# Patient Record
Sex: Male | Born: 1935
Health system: Southern US, Community
[De-identification: ages and names within clinical notes are randomized; demographics above are authoritative.]

## PROBLEM LIST (undated history)

## (undated) DIAGNOSIS — I82409 Acute embolism and thrombosis of unspecified deep veins of unspecified lower extremity: Secondary | ICD-10-CM

## (undated) DIAGNOSIS — G629 Polyneuropathy, unspecified: Secondary | ICD-10-CM

## (undated) DIAGNOSIS — K649 Unspecified hemorrhoids: Secondary | ICD-10-CM

## (undated) DIAGNOSIS — K259 Gastric ulcer, unspecified as acute or chronic, without hemorrhage or perforation: Secondary | ICD-10-CM

## (undated) DIAGNOSIS — N400 Enlarged prostate without lower urinary tract symptoms: Secondary | ICD-10-CM

## (undated) DIAGNOSIS — K449 Diaphragmatic hernia without obstruction or gangrene: Secondary | ICD-10-CM

## (undated) DIAGNOSIS — Z8742 Personal history of other diseases of the female genital tract: Secondary | ICD-10-CM

## (undated) DIAGNOSIS — K5790 Diverticulosis of intestine, part unspecified, without perforation or abscess without bleeding: Secondary | ICD-10-CM

## (undated) DIAGNOSIS — T45515A Adverse effect of anticoagulants, initial encounter: Secondary | ICD-10-CM

## (undated) DIAGNOSIS — D500 Iron deficiency anemia secondary to blood loss (chronic): Secondary | ICD-10-CM

## (undated) DIAGNOSIS — E785 Hyperlipidemia, unspecified: Secondary | ICD-10-CM

## (undated) DIAGNOSIS — I10 Essential (primary) hypertension: Secondary | ICD-10-CM

## (undated) DIAGNOSIS — G459 Transient cerebral ischemic attack, unspecified: Secondary | ICD-10-CM

## (undated) DIAGNOSIS — I4891 Unspecified atrial fibrillation: Secondary | ICD-10-CM

## (undated) DIAGNOSIS — E1140 Type 2 diabetes mellitus with diabetic neuropathy, unspecified: Secondary | ICD-10-CM

## (undated) DIAGNOSIS — D6832 Hemorrhagic disorder due to extrinsic circulating anticoagulants: Secondary | ICD-10-CM

## (undated) DIAGNOSIS — K635 Polyp of colon: Secondary | ICD-10-CM

## (undated) DIAGNOSIS — K2970 Gastritis, unspecified, without bleeding: Secondary | ICD-10-CM

## (undated) DIAGNOSIS — Z9889 Other specified postprocedural states: Secondary | ICD-10-CM

## (undated) HISTORY — DX: Hyperlipidemia, unspecified: E78.5

## (undated) HISTORY — DX: Diverticulosis of intestine, part unspecified, without perforation or abscess without bleeding: K57.90

## (undated) HISTORY — DX: Personal history of other diseases of the female genital tract: Z87.42

## (undated) HISTORY — DX: Benign prostatic hyperplasia without lower urinary tract symptoms: N40.0

## (undated) HISTORY — DX: Acute embolism and thrombosis of unspecified deep veins of unspecified lower extremity: I82.409

## (undated) HISTORY — DX: Polyneuropathy, unspecified: G62.9

## (undated) HISTORY — DX: Diaphragmatic hernia without obstruction or gangrene: K44.9

## (undated) HISTORY — PX: OTHER SURGICAL HISTORY: SHX169

## (undated) HISTORY — PX: VENA CAVA FILTER PLACEMENT: SUR1032

## (undated) HISTORY — DX: Other specified postprocedural states: Z98.890

## (undated) HISTORY — DX: Gastritis, unspecified, without bleeding: K29.70

---

## 1977-09-06 HISTORY — PX: LAPAROSCOPIC INGUINAL HERNIA REPAIR: SUR788

## 2002-09-27 ENCOUNTER — Encounter: Payer: Self-pay | Admitting: General Surgery

## 2002-09-27 ENCOUNTER — Ambulatory Visit (HOSPITAL_COMMUNITY): Admission: RE | Admit: 2002-09-27 | Discharge: 2002-09-27 | Payer: Self-pay | Admitting: General Surgery

## 2003-09-07 HISTORY — PX: OTHER SURGICAL HISTORY: SHX169

## 2003-09-13 ENCOUNTER — Inpatient Hospital Stay (HOSPITAL_COMMUNITY): Admission: RE | Admit: 2003-09-13 | Discharge: 2003-09-15 | Payer: Self-pay | Admitting: Urology

## 2005-01-28 ENCOUNTER — Other Ambulatory Visit: Admission: RE | Admit: 2005-01-28 | Discharge: 2005-01-28 | Payer: Self-pay | Admitting: General Surgery

## 2005-02-08 ENCOUNTER — Emergency Department (HOSPITAL_COMMUNITY): Admission: EM | Admit: 2005-02-08 | Discharge: 2005-02-08 | Payer: Self-pay | Admitting: Emergency Medicine

## 2005-05-18 ENCOUNTER — Inpatient Hospital Stay (HOSPITAL_COMMUNITY): Admission: EM | Admit: 2005-05-18 | Discharge: 2005-05-24 | Payer: Self-pay | Admitting: Emergency Medicine

## 2005-05-21 ENCOUNTER — Encounter (INDEPENDENT_AMBULATORY_CARE_PROVIDER_SITE_OTHER): Payer: Self-pay | Admitting: Specialist

## 2005-05-21 LAB — HM COLONOSCOPY

## 2006-11-22 ENCOUNTER — Ambulatory Visit (HOSPITAL_COMMUNITY): Admission: RE | Admit: 2006-11-22 | Discharge: 2006-11-22 | Payer: Self-pay | Admitting: General Surgery

## 2006-12-08 ENCOUNTER — Encounter (INDEPENDENT_AMBULATORY_CARE_PROVIDER_SITE_OTHER): Payer: Self-pay | Admitting: Specialist

## 2006-12-08 ENCOUNTER — Other Ambulatory Visit: Admission: RE | Admit: 2006-12-08 | Discharge: 2006-12-08 | Payer: Self-pay | Admitting: General Surgery

## 2008-12-25 ENCOUNTER — Encounter: Payer: Self-pay | Admitting: Cardiology

## 2009-01-01 DIAGNOSIS — I10 Essential (primary) hypertension: Secondary | ICD-10-CM | POA: Insufficient documentation

## 2009-01-02 ENCOUNTER — Ambulatory Visit: Payer: Self-pay | Admitting: Cardiology

## 2009-01-02 DIAGNOSIS — R9439 Abnormal result of other cardiovascular function study: Secondary | ICD-10-CM | POA: Insufficient documentation

## 2009-01-02 DIAGNOSIS — E663 Overweight: Secondary | ICD-10-CM | POA: Insufficient documentation

## 2009-01-08 ENCOUNTER — Telehealth (INDEPENDENT_AMBULATORY_CARE_PROVIDER_SITE_OTHER): Payer: Self-pay | Admitting: *Deleted

## 2009-01-09 ENCOUNTER — Encounter: Payer: Self-pay | Admitting: Cardiology

## 2009-01-09 ENCOUNTER — Ambulatory Visit: Payer: Self-pay

## 2010-07-28 ENCOUNTER — Inpatient Hospital Stay (HOSPITAL_COMMUNITY): Admission: EM | Admit: 2010-07-28 | Discharge: 2010-07-29 | Payer: Self-pay | Admitting: Emergency Medicine

## 2010-07-28 ENCOUNTER — Ambulatory Visit: Payer: Self-pay | Admitting: Cardiology

## 2010-07-29 ENCOUNTER — Encounter (INDEPENDENT_AMBULATORY_CARE_PROVIDER_SITE_OTHER): Payer: Self-pay | Admitting: Internal Medicine

## 2010-09-01 ENCOUNTER — Ambulatory Visit (HOSPITAL_COMMUNITY)
Admission: RE | Admit: 2010-09-01 | Discharge: 2010-09-01 | Payer: Self-pay | Source: Home / Self Care | Attending: Family Medicine | Admitting: Family Medicine

## 2010-11-06 ENCOUNTER — Other Ambulatory Visit: Payer: Self-pay | Admitting: Family Medicine

## 2010-11-06 DIAGNOSIS — Z09 Encounter for follow-up examination after completed treatment for conditions other than malignant neoplasm: Secondary | ICD-10-CM

## 2010-11-10 ENCOUNTER — Other Ambulatory Visit (HOSPITAL_COMMUNITY): Payer: Self-pay

## 2010-11-17 LAB — DIFFERENTIAL
Basophils Absolute: 0 10*3/uL (ref 0.0–0.1)
Basophils Absolute: 0 10*3/uL (ref 0.0–0.1)
Basophils Relative: 1 % (ref 0–1)
Eosinophils Absolute: 0.2 10*3/uL (ref 0.0–0.7)
Eosinophils Relative: 3 % (ref 0–5)
Lymphs Abs: 1.8 10*3/uL (ref 0.7–4.0)
Monocytes Absolute: 0.4 10*3/uL (ref 0.1–1.0)
Neutro Abs: 4 10*3/uL (ref 1.7–7.7)
Neutrophils Relative %: 63 % (ref 43–77)

## 2010-11-17 LAB — CARDIAC PANEL(CRET KIN+CKTOT+MB+TROPI)
Relative Index: INVALID (ref 0.0–2.5)
Troponin I: 0.01 ng/mL (ref 0.00–0.06)

## 2010-11-17 LAB — COMPREHENSIVE METABOLIC PANEL
ALT: 24 U/L (ref 0–53)
AST: 25 U/L (ref 0–37)
Albumin: 3.2 g/dL — ABNORMAL LOW (ref 3.5–5.2)
Albumin: 3.5 g/dL (ref 3.5–5.2)
Alkaline Phosphatase: 37 U/L — ABNORMAL LOW (ref 39–117)
Alkaline Phosphatase: 38 U/L — ABNORMAL LOW (ref 39–117)
BUN: 11 mg/dL (ref 6–23)
BUN: 11 mg/dL (ref 6–23)
CO2: 28 mEq/L (ref 19–32)
Calcium: 9.2 mg/dL (ref 8.4–10.5)
Chloride: 104 mEq/L (ref 96–112)
Chloride: 105 mEq/L (ref 96–112)
Creatinine, Ser: 0.8 mg/dL (ref 0.4–1.5)
GFR calc Af Amer: >60 mL/min (ref >60)
GFR calc non Af Amer: >60 mL/min (ref >60)
Glucose, Bld: 114 mg/dL — ABNORMAL HIGH (ref 70–99)
Potassium: 3.3 mEq/L — ABNORMAL LOW (ref 3.5–5.1)
Potassium: 3.6 mEq/L (ref 3.5–5.1)
Sodium: 140 mEq/L (ref 135–145)
Total Bilirubin: 0.5 mg/dL (ref 0.3–1.2)
Total Bilirubin: 0.5 mg/dL (ref 0.3–1.2)
Total Protein: 6.1 g/dL (ref 6.0–8.3)

## 2010-11-17 LAB — CBC
HCT: 36.5 % — ABNORMAL LOW (ref 39.0–52.0)
Hemoglobin: 12.9 g/dL — ABNORMAL LOW (ref 13.0–17.0)
Hemoglobin: 13.4 g/dL (ref 13.0–17.0)
MCHC: 36.4 g/dL — ABNORMAL HIGH (ref 30.0–36.0)
MCV: 89.7 fL (ref 78.0–100.0)
MCV: 90 fL (ref 78.0–100.0)
Platelets: 175 10*3/uL (ref 150–400)
RBC: 4.07 MIL/uL — ABNORMAL LOW (ref 4.22–5.81)
RBC: 4.19 MIL/uL — ABNORMAL LOW (ref 4.22–5.81)
RDW: 13.4 % (ref 11.5–15.5)
WBC: 5.2 10*3/uL (ref 4.0–10.5)
WBC: 6.4 10*3/uL (ref 4.0–10.5)

## 2010-11-17 LAB — PROTIME-INR
INR: 1.5 — ABNORMAL HIGH (ref 0.00–1.49)
Prothrombin Time: 17.1 seconds — ABNORMAL HIGH (ref 11.6–15.2)
Prothrombin Time: 18 seconds — ABNORMAL HIGH (ref 11.6–15.2)
Prothrombin Time: 18.3 seconds — ABNORMAL HIGH (ref 11.6–15.2)

## 2010-11-17 LAB — URINALYSIS, ROUTINE W REFLEX MICROSCOPIC
Bilirubin Urine: NEGATIVE
Nitrite: NEGATIVE
Specific Gravity, Urine: 1.015 (ref 1.005–1.030)
pH: 6 (ref 5.0–8.0)

## 2010-11-17 LAB — APTT: aPTT: 32 seconds (ref 24–37)

## 2010-11-17 LAB — HEMOGLOBIN A1C
Hgb A1c MFr Bld: 6.1 % — ABNORMAL HIGH (ref <5.7)
Mean Plasma Glucose: 128 mg/dL — ABNORMAL HIGH (ref <117)

## 2010-11-17 LAB — POCT CARDIAC MARKERS
Myoglobin, poc: 39.9 ng/mL (ref 12–200)
Troponin i, poc: <0.05 ng/mL (ref 0.00–0.09)

## 2010-11-28 ENCOUNTER — Encounter: Payer: Self-pay | Admitting: *Deleted

## 2010-11-28 DIAGNOSIS — I82409 Acute embolism and thrombosis of unspecified deep veins of unspecified lower extremity: Secondary | ICD-10-CM

## 2010-11-28 DIAGNOSIS — I639 Cerebral infarction, unspecified: Secondary | ICD-10-CM

## 2010-11-28 DIAGNOSIS — E785 Hyperlipidemia, unspecified: Secondary | ICD-10-CM

## 2010-11-28 DIAGNOSIS — N529 Male erectile dysfunction, unspecified: Secondary | ICD-10-CM

## 2010-11-28 DIAGNOSIS — K449 Diaphragmatic hernia without obstruction or gangrene: Secondary | ICD-10-CM

## 2010-11-28 DIAGNOSIS — G629 Polyneuropathy, unspecified: Secondary | ICD-10-CM

## 2010-11-28 DIAGNOSIS — M8580 Other specified disorders of bone density and structure, unspecified site: Secondary | ICD-10-CM

## 2010-11-28 DIAGNOSIS — N400 Enlarged prostate without lower urinary tract symptoms: Secondary | ICD-10-CM

## 2010-12-01 ENCOUNTER — Encounter (HOSPITAL_COMMUNITY): Payer: Self-pay

## 2010-12-01 ENCOUNTER — Ambulatory Visit (HOSPITAL_COMMUNITY)
Admission: RE | Admit: 2010-12-01 | Discharge: 2010-12-01 | Disposition: A | Discharge status: Home / Self Care | Payer: Medicare HMO | Source: Ambulatory Visit | Attending: Family Medicine | Admitting: Family Medicine

## 2010-12-01 DIAGNOSIS — Z09 Encounter for follow-up examination after completed treatment for conditions other than malignant neoplasm: Secondary | ICD-10-CM

## 2010-12-01 DIAGNOSIS — J984 Other disorders of lung: Secondary | ICD-10-CM | POA: Insufficient documentation

## 2010-12-01 HISTORY — DX: Essential (primary) hypertension: I10

## 2010-12-01 MED ORDER — IOHEXOL 300 MG/ML  SOLN
80.0000 mL | Freq: Once | INTRAMUSCULAR | Status: AC | PRN
  Administered 2010-12-01: 80 mL via INTRAVENOUS

## 2010-12-22 ENCOUNTER — Other Ambulatory Visit: Payer: Self-pay | Admitting: Family Medicine

## 2010-12-22 DIAGNOSIS — N289 Disorder of kidney and ureter, unspecified: Secondary | ICD-10-CM

## 2010-12-28 ENCOUNTER — Ambulatory Visit (HOSPITAL_COMMUNITY)
Admission: RE | Admit: 2010-12-28 | Discharge: 2010-12-28 | Disposition: A | Discharge status: Home / Self Care | Payer: Medicare HMO | Source: Ambulatory Visit | Attending: Family Medicine | Admitting: Family Medicine

## 2010-12-28 DIAGNOSIS — N289 Disorder of kidney and ureter, unspecified: Secondary | ICD-10-CM | POA: Insufficient documentation

## 2010-12-28 MED ORDER — GADOBENATE DIMEGLUMINE 529 MG/ML IV SOLN: 20.0000 mL | Freq: Once | INTRAVENOUS | Status: AC | PRN
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
## 2011-01-22 NOTE — Op Note (Signed)
NAME:  Keith Doyle, Keith Doyle                             ACCOUNT NO.:  [id number]   MEDICAL RECORD NO.:  [id number]                   PATIENT TYPE:  INP   LOCATION:  A204                                 FACILITY:  [facility]   PHYSICIAN:  Ky Barban, M.D.            DATE OF BIRTH:  09-Jun-1936   DATE OF PROCEDURE:  09/13/2003  DATE OF DISCHARGE:                                 OPERATIVE REPORT   PREOPERATIVE DIAGNOSIS:  Benign prostatic hypertrophy.   POSTOPERATIVE DIAGNOSIS:  Benign prostatic hypertrophy.   PROCEDURE:  Transurethral resection of the prostate.   ANESTHESIA:  Spinal.   DESCRIPTION OF PROCEDURE:  The patient was given spinal anesthesia, placed  in lithotomy position, prepped and draped.  Sissy Hoff sounds up to 30 [place]  were introduced and Microsoft resectoscope was introduced into the  bladder.  The bladder was inspected.  The resectoscope was pulled back in  the mid prostatic urethra.  He has a well developed medial lobe and also  lateral lobe hypertrophy.  The median lobe was resected from the bladder  neck to the level of the verumontanum and then the bladder neck is  circumferentially resected down to the circular fibers.  Now, the  resectoscope was pulled back to the level of the verumontanum at the 11  o'clock position.  A groove was created at the 11 o'clock position from the  bladder neck to the level of the verumontanum.  The right lobe is then  resected between the 11 and 7 o'clock position.  Similarly, the left lobe is  resected between the 1 and 5 o'clock position.  Next, the posterior midline  fissure was resected along with the tissue very carefully, not going near  the sphincter or the verumontanum.  There was a small amount of tissue left  in the midline which was resected next.  The chips were evacuated.  The  bleeders were coagulated.  The prostatic urethra is wide open, the  resectoscope is then removed.  A 22 three way Foley catheter was left in for  drainage, through and through is started which is clear.  The patient left  the operating room in satisfactory condition.                                               Ky Barban, M.D.    MIJ/MEDQ  D:  09/13/2003  T:  09/13/2003  Job:  [id number]

## 2011-01-22 NOTE — H&P (Signed)
NAME:  Keith Doyle, Keith Doyle                             ACCOUNT NO.:  [id number]   MEDICAL RECORD NO.:  [id number]                   PATIENT TYPE:  AMB   LOCATION:  DAY                                  FACILITY:  [facility]   PHYSICIAN:  Ky Barban, M.D.            DATE OF BIRTH:  08-18-1936   DATE OF ADMISSION:  DATE OF DISCHARGE:                                HISTORY & PHYSICAL   CHIEF COMPLAINT:  Symptoms of prostatism.   This 75 year old gentleman is being followed by me since March 1998.  He was  having significant voiding symptoms and at that time workup showed that he  has enlarged prostate, so he was placed on Cardura.  He has been taking  Cardura ever since with satisfactory results but lately symptoms have come  back and he has continued to have slow-weak stream and nocturia and  frequency.  I have advised him that we can go ahead and do a TUR prostate  now to help him with his symptoms.  The procedure, its limitations and  complications are explained.  He understands and wants me to go ahead and  proceed.   PAST HISTORY:  No history of diabetes or hypertension.  Left inguinal hernia  repair in 1979.   FAMILY HISTORY:  No history of prostate cancer.   The patient does not smoke or drink.   REVIEW OF SYSTEMS:  Denies any chest pain, orthopnea, PND, nausea, vomiting,  fevers, chills.   PHYSICAL EXAMINATION:  GENERAL:  A well-nourished, well-developed male.  VITAL SIGNS:  Blood pressure 130/80, temperature is normal.  CENTRAL NERVOUS SYSTEM:  No gross neurologic deficit.  HEENT:  Negative.  CHEST:  Symmetrical.  CARDIAC:  Regular sinus rhythm, no murmur.  ABDOMEN:  Soft, flat.  Liver, spleen, kidneys are not palpable.  GENITOURINARY:  External genitalia:  He is circumcised, meatus is adequate.  Testicles are normal.  RECTAL:  Normal sphincter tone, no rectal mass.  Prostate 1-1/2+, smooth and  firm.   IMPRESSION:  Benign prostatic hypertrophy.   PLAN:  TUR prostate  under anesthesia.  Then he will be admitted in the  hospital.                                                Ky Barban, M.D.    MIJ/MEDQ  D:  09/12/2003  T:  09/12/2003  Job:  [id number]
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
## 2011-01-22 NOTE — Discharge Summary (Signed)
Keith Doyle, Keith Doyle                   ACCOUNT NO.:  [id number]   MEDICAL RECORD NO.:  [id number]          PATIENT TYPE:  INP   LOCATION:  5506                         FACILITY:  [facility]   PHYSICIAN:  Lonia Blood, M.D.       DATE OF BIRTH:  01/11/1936   DATE OF ADMISSION:  05/18/2005  DATE OF DISCHARGE:  05/24/2005                                 DISCHARGE SUMMARY   ADDENDUM:   DISCHARGE DIAGNOSES:  Please refer to the previous discharge summary.   DISCHARGE MEDICATIONS:  A correction was made on the Coumadin and the  correct dose is 5 mg p.o. daily with further adjustment at the [facility]. Also Vicodin 5/500 q.6h. p.r.n. pain.   INSTRUCTIONS AT DISCHARGE:  The patient was scheduled to follow up at  [facility] on Tuesday, at 15:20 p.m. for PT-INR  check and Coumadin adjustment. He also was instructed to follow up with the  [facility] with his new primary care physician on  Wednesday, May 26, 2005. Also the patient was given a work excuse  until June 05, 2005, given his severe deep venous thrombosis. He was  also instructed to elevate his right leg as much as possible and to try not  to stand on his feet for too long.   PROCEDURE/CONSULTATION/ADMISSION HISTORY AND PHYSICAL/HOSPITAL COURSE:  Please refer to the previous dictated discharge summary. For hospital course  covering May 22, 2005, until May 24, 2005:   PROBLEM #1:  Deep venous thrombosis. The patient was started on stocking  compression and he was able to ambulate without difficulty. His inferior  vena cava filter was kept in place. After a discussion was held with the on-  call gastroenterologist, Dr. Stan Head, it was felt it might be safe to  start anticoagulation on this patient.  He was started on Coumadin 5 mg on  May 22, 2005, and 10 mg on May 23, 2005. He was instructed to  take 5 mg at 6 p.m. daily at home and  to follow up for Coumadin adjustment.  Decision was made not to use Lovenox since the patient might be at high risk  for bleeding given his hemoccult positive stools. Once he is fully  anticoagulated for  about a week, he can follow up with interventional radiology at [facility]  to have the inferior vena cava filter removed. His primary care physician  will also check his CBC to assure stability of his hemoglobin and the  patient will also continue iron therapy. At the time of discharge the  patient's hemoglobin was 11.           ______________________________  Lonia Blood, M.D.     SL/MEDQ  D:  05/24/2005  T:  05/24/2005  Job:  [id number]   cc:   Ernestina Penna, M.D.  [address] [place]  [place] [address]  Fax: [phone]

## 2011-03-11 ENCOUNTER — Encounter: Payer: Self-pay | Admitting: Cardiology

## 2011-03-17 ENCOUNTER — Other Ambulatory Visit (HOSPITAL_COMMUNITY): Payer: Self-pay | Admitting: General Surgery

## 2011-03-17 DIAGNOSIS — R200 Anesthesia of skin: Secondary | ICD-10-CM

## 2011-03-22 ENCOUNTER — Ambulatory Visit (HOSPITAL_COMMUNITY)
Admission: RE | Admit: 2011-03-22 | Discharge: 2011-03-22 | Disposition: A | Discharge status: Home / Self Care | Payer: Medicare HMO | Source: Ambulatory Visit | Attending: General Surgery | Admitting: General Surgery

## 2011-03-22 DIAGNOSIS — I10 Essential (primary) hypertension: Secondary | ICD-10-CM | POA: Insufficient documentation

## 2011-03-22 DIAGNOSIS — R209 Unspecified disturbances of skin sensation: Secondary | ICD-10-CM | POA: Insufficient documentation

## 2011-03-22 DIAGNOSIS — R200 Anesthesia of skin: Secondary | ICD-10-CM

## 2011-03-22 DIAGNOSIS — I739 Peripheral vascular disease, unspecified: Secondary | ICD-10-CM | POA: Insufficient documentation

## 2011-03-22 DIAGNOSIS — E785 Hyperlipidemia, unspecified: Secondary | ICD-10-CM | POA: Insufficient documentation

## 2011-08-04 ENCOUNTER — Other Ambulatory Visit (HOSPITAL_COMMUNITY): Payer: Self-pay | Admitting: General Surgery

## 2011-08-04 ENCOUNTER — Ambulatory Visit (HOSPITAL_COMMUNITY)
Admission: RE | Admit: 2011-08-04 | Discharge: 2011-08-04 | Disposition: A | Discharge status: Home / Self Care | Payer: Medicare HMO | Source: Ambulatory Visit | Attending: General Surgery | Admitting: General Surgery

## 2011-08-04 DIAGNOSIS — M25559 Pain in unspecified hip: Secondary | ICD-10-CM | POA: Insufficient documentation

## 2011-08-04 DIAGNOSIS — R1032 Left lower quadrant pain: Secondary | ICD-10-CM

## 2011-08-04 DIAGNOSIS — M1610 Unilateral primary osteoarthritis, unspecified hip: Secondary | ICD-10-CM | POA: Insufficient documentation

## 2011-08-04 DIAGNOSIS — M169 Osteoarthritis of hip, unspecified: Secondary | ICD-10-CM | POA: Insufficient documentation

## 2011-08-28 ENCOUNTER — Emergency Department (HOSPITAL_COMMUNITY): Payer: Medicare HMO

## 2011-08-28 ENCOUNTER — Encounter (HOSPITAL_COMMUNITY): Payer: Self-pay | Admitting: *Deleted

## 2011-08-28 ENCOUNTER — Inpatient Hospital Stay (HOSPITAL_COMMUNITY)
Admission: EM | Admit: 2011-08-28 | Discharge: 2011-08-30 | DRG: 194 | Disposition: A | Discharge status: Home / Self Care | Payer: Medicare HMO | Attending: Internal Medicine | Admitting: Internal Medicine

## 2011-08-28 DIAGNOSIS — R0902 Hypoxemia: Secondary | ICD-10-CM

## 2011-08-28 DIAGNOSIS — K449 Diaphragmatic hernia without obstruction or gangrene: Secondary | ICD-10-CM

## 2011-08-28 DIAGNOSIS — R531 Weakness: Secondary | ICD-10-CM

## 2011-08-28 DIAGNOSIS — E860 Dehydration: Secondary | ICD-10-CM | POA: Diagnosis present

## 2011-08-28 DIAGNOSIS — R9439 Abnormal result of other cardiovascular function study: Secondary | ICD-10-CM

## 2011-08-28 DIAGNOSIS — I10 Essential (primary) hypertension: Secondary | ICD-10-CM

## 2011-08-28 DIAGNOSIS — J09X2 Influenza due to identified novel influenza A virus with other respiratory manifestations: Principal | ICD-10-CM | POA: Diagnosis present

## 2011-08-28 DIAGNOSIS — R05 Cough: Secondary | ICD-10-CM

## 2011-08-28 DIAGNOSIS — I82409 Acute embolism and thrombosis of unspecified deep veins of unspecified lower extremity: Secondary | ICD-10-CM

## 2011-08-28 DIAGNOSIS — N400 Enlarged prostate without lower urinary tract symptoms: Secondary | ICD-10-CM | POA: Diagnosis present

## 2011-08-28 DIAGNOSIS — N529 Male erectile dysfunction, unspecified: Secondary | ICD-10-CM

## 2011-08-28 DIAGNOSIS — R9430 Abnormal result of cardiovascular function study, unspecified: Secondary | ICD-10-CM

## 2011-08-28 DIAGNOSIS — M8580 Other specified disorders of bone density and structure, unspecified site: Secondary | ICD-10-CM

## 2011-08-28 DIAGNOSIS — E663 Overweight: Secondary | ICD-10-CM

## 2011-08-28 DIAGNOSIS — Z86718 Personal history of other venous thrombosis and embolism: Secondary | ICD-10-CM

## 2011-08-28 DIAGNOSIS — E876 Hypokalemia: Secondary | ICD-10-CM | POA: Diagnosis present

## 2011-08-28 DIAGNOSIS — I639 Cerebral infarction, unspecified: Secondary | ICD-10-CM | POA: Diagnosis present

## 2011-08-28 DIAGNOSIS — N179 Acute kidney failure, unspecified: Secondary | ICD-10-CM | POA: Diagnosis present

## 2011-08-28 DIAGNOSIS — G629 Polyneuropathy, unspecified: Secondary | ICD-10-CM | POA: Diagnosis present

## 2011-08-28 DIAGNOSIS — G609 Hereditary and idiopathic neuropathy, unspecified: Secondary | ICD-10-CM | POA: Diagnosis present

## 2011-08-28 DIAGNOSIS — E785 Hyperlipidemia, unspecified: Secondary | ICD-10-CM | POA: Diagnosis present

## 2011-08-28 DIAGNOSIS — J101 Influenza due to other identified influenza virus with other respiratory manifestations: Secondary | ICD-10-CM | POA: Diagnosis present

## 2011-08-28 DIAGNOSIS — Z8673 Personal history of transient ischemic attack (TIA), and cerebral infarction without residual deficits: Secondary | ICD-10-CM

## 2011-08-28 DIAGNOSIS — R059 Cough, unspecified: Secondary | ICD-10-CM | POA: Diagnosis present

## 2011-08-28 LAB — INFLUENZA PANEL BY PCR (TYPE A & B): Influenza A By PCR: POSITIVE — AB

## 2011-08-28 LAB — BASIC METABOLIC PANEL
BUN: 21 mg/dL (ref 6–23)
CO2: 26 mEq/L (ref 19–32)
Calcium: 8.9 mg/dL (ref 8.4–10.5)
Creatinine, Ser: 1.4 mg/dL — ABNORMAL HIGH (ref 0.50–1.35)
Glucose, Bld: 232 mg/dL — ABNORMAL HIGH (ref 70–99)
Sodium: 134 mEq/L — ABNORMAL LOW (ref 135–145)

## 2011-08-28 LAB — CBC
MCH: 32.3 pg (ref 26.0–34.0)
MCHC: 34.4 g/dL (ref 30.0–36.0)
Platelets: 146 10*3/uL — ABNORMAL LOW (ref 150–400)
RDW: 13.7 % (ref 11.5–15.5)

## 2011-08-28 LAB — LACTIC ACID, PLASMA: Lactic Acid, Venous: 2.1 mmol/L (ref 0.5–2.2)

## 2011-08-28 LAB — DIFFERENTIAL
Eosinophils Relative: 0 % (ref 0–5)
Monocytes Absolute: 0.6 10*3/uL (ref 0.1–1.0)
Monocytes Relative: 7 % (ref 3–12)
Neutro Abs: 6.3 10*3/uL (ref 1.7–7.7)
Neutrophils Relative %: 80 % — ABNORMAL HIGH (ref 43–77)

## 2011-08-28 LAB — PROTIME-INR: Prothrombin Time: 29.4 seconds — ABNORMAL HIGH (ref 11.6–15.2)

## 2011-08-28 LAB — MAGNESIUM: Magnesium: 1.9 mg/dL (ref 1.5–2.5)

## 2011-08-28 MED ORDER — MOXIFLOXACIN HCL IN NACL 400 MG/250ML IV SOLN
400.0000 mg | Freq: Once | INTRAVENOUS | Status: AC
  Administered 2011-08-28: 400 mg via INTRAVENOUS
  Filled 2011-08-28: qty 250

## 2011-08-28 MED ORDER — MOXIFLOXACIN HCL IN NACL 400 MG/250ML IV SOLN
400.0000 mg | INTRAVENOUS | Status: DC
  Filled 2011-08-28 (×2): qty 250

## 2011-08-28 MED ORDER — DEXTROSE 5 % IV SOLN
1.0000 g | Freq: Once | INTRAVENOUS | Status: AC
  Administered 2011-08-28: 1 g via INTRAVENOUS
  Filled 2011-08-28: qty 10

## 2011-08-28 MED ORDER — ACETAMINOPHEN 325 MG PO TABS
650.0000 mg | ORAL_TABLET | Freq: Once | ORAL | Status: AC
  Administered 2011-08-28: 650 mg via ORAL
  Filled 2011-08-28: qty 2

## 2011-08-28 MED ORDER — DIAZEPAM 5 MG PO TABS: 5.0000 mg | ORAL_TABLET | Freq: Every evening | ORAL | Status: DC | PRN

## 2011-08-28 MED ORDER — PREGABALIN 75 MG PO CAPS
75.0000 mg | ORAL_CAPSULE | Freq: Two times a day (BID) | ORAL | Status: DC
  Administered 2011-08-28 – 2011-08-30 (×4): 75 mg via ORAL
  Filled 2011-08-28 (×4): qty 1

## 2011-08-28 MED ORDER — GABAPENTIN 300 MG PO CAPS
300.0000 mg | ORAL_CAPSULE | Freq: Three times a day (TID) | ORAL | Status: DC
  Administered 2011-08-28 – 2011-08-30 (×5): 300 mg via ORAL
  Filled 2011-08-28 (×5): qty 1

## 2011-08-28 MED ORDER — OSELTAMIVIR PHOSPHATE 75 MG PO CAPS
75.0000 mg | ORAL_CAPSULE | Freq: Once | ORAL | Status: AC
  Administered 2011-08-28: 75 mg via ORAL

## 2011-08-28 MED ORDER — TOLTERODINE TARTRATE ER 2 MG PO CP24
4.0000 mg | ORAL_CAPSULE | Freq: Every day | ORAL | Status: DC
Start: 2011-08-28 — End: 2011-08-30
  Administered 2011-08-28 – 2011-08-30 (×3): 4 mg via ORAL
  Filled 2011-08-28 (×3): qty 2

## 2011-08-28 MED ORDER — ACETAMINOPHEN 650 MG RE SUPP: 650.0000 mg | Freq: Four times a day (QID) | RECTAL | Status: DC | PRN

## 2011-08-28 MED ORDER — POTASSIUM CHLORIDE IN NACL 20-0.9 MEQ/L-% IV SOLN
INTRAVENOUS | Status: AC
  Administered 2011-08-28: 23:00:00 via INTRAVENOUS

## 2011-08-28 MED ORDER — SODIUM CHLORIDE 0.9 % IV BOLUS (SEPSIS)
500.0000 mL | Freq: Once | INTRAVENOUS | Status: AC
  Administered 2011-08-28: 500 mL via INTRAVENOUS

## 2011-08-28 MED ORDER — DOCUSATE SODIUM 100 MG PO CAPS
100.0000 mg | ORAL_CAPSULE | Freq: Two times a day (BID) | ORAL | Status: DC
  Administered 2011-08-28 – 2011-08-30 (×4): 100 mg via ORAL
  Filled 2011-08-28 (×4): qty 1

## 2011-08-28 MED ORDER — ASPIRIN EC 325 MG PO TBEC
325.0000 mg | DELAYED_RELEASE_TABLET | Freq: Every day | ORAL | Status: DC
Start: 2011-08-28 — End: 2011-08-30
  Administered 2011-08-29 – 2011-08-30 (×2): 325 mg via ORAL
  Filled 2011-08-28 (×3): qty 1

## 2011-08-28 MED ORDER — OSELTAMIVIR PHOSPHATE 75 MG PO CAPS
ORAL_CAPSULE | ORAL | Status: AC
  Filled 2011-08-28: qty 1

## 2011-08-28 MED ORDER — OSELTAMIVIR PHOSPHATE 75 MG PO CAPS
75.0000 mg | ORAL_CAPSULE | Freq: Two times a day (BID) | ORAL | Status: DC
  Administered 2011-08-29 – 2011-08-30 (×3): 75 mg via ORAL
  Filled 2011-08-28 (×3): qty 1

## 2011-08-28 MED ORDER — SODIUM CHLORIDE 0.9 % IV SOLN
INTRAVENOUS | Status: DC
  Administered 2011-08-28: 500 mL via INTRAVENOUS
  Administered 2011-08-29: 10:00:00 via INTRAVENOUS

## 2011-08-28 MED ORDER — ACETAMINOPHEN 325 MG PO TABS: 650.0000 mg | ORAL_TABLET | Freq: Four times a day (QID) | ORAL | Status: DC | PRN

## 2011-08-28 MED ORDER — SIMVASTATIN 20 MG PO TABS
20.0000 mg | ORAL_TABLET | Freq: Every day | ORAL | Status: DC
  Administered 2011-08-29: 20 mg via ORAL
  Filled 2011-08-28: qty 1

## 2011-08-28 MED ORDER — ONDANSETRON HCL 4 MG/2ML IJ SOLN: 4.0000 mg | Freq: Four times a day (QID) | INTRAMUSCULAR | Status: DC | PRN

## 2011-08-28 MED ORDER — POTASSIUM CHLORIDE CRYS ER 20 MEQ PO TBCR: 40.0000 meq | EXTENDED_RELEASE_TABLET | Freq: Once | ORAL | Status: DC

## 2011-08-28 MED ORDER — FENOFIBRATE 160 MG PO TABS
160.0000 mg | ORAL_TABLET | Freq: Every day | ORAL | Status: DC
  Administered 2011-08-29 – 2011-08-30 (×2): 160 mg via ORAL
  Filled 2011-08-28 (×5): qty 1

## 2011-08-28 MED ORDER — POTASSIUM CHLORIDE 10 MEQ/100ML IV SOLN
10.0000 meq | INTRAVENOUS | Status: AC
  Administered 2011-08-28: 10 meq via INTRAVENOUS
  Filled 2011-08-28: qty 100

## 2011-08-28 MED ORDER — WARFARIN SODIUM 5 MG PO TABS
5.0000 mg | ORAL_TABLET | Freq: Once | ORAL | Status: AC
  Administered 2011-08-28: 5 mg via ORAL
  Filled 2011-08-28: qty 1

## 2011-08-28 MED ORDER — ALBUTEROL SULFATE (5 MG/ML) 0.5% IN NEBU: 2.5000 mg | INHALATION_SOLUTION | RESPIRATORY_TRACT | Status: DC | PRN

## 2011-08-28 MED ORDER — ONDANSETRON HCL 4 MG PO TABS: 4.0000 mg | ORAL_TABLET | Freq: Four times a day (QID) | ORAL | Status: DC | PRN

## 2011-08-28 MED ORDER — POTASSIUM CHLORIDE CRYS ER 20 MEQ PO TBCR
40.0000 meq | EXTENDED_RELEASE_TABLET | Freq: Once | ORAL | Status: AC
  Administered 2011-08-28: 40 meq via ORAL
  Filled 2011-08-28: qty 2

## 2011-08-28 NOTE — ED Notes (Signed)
Fever of 104 per EMS.  Ibuprofen 800mg  po given enroute. Pt states productive cough and fever began this morning around 1000.

## 2011-08-28 NOTE — ED Notes (Signed)
Report attempted. Sec stated nurse will have to take report from night shift

## 2011-08-28 NOTE — ED Notes (Signed)
O2 drops to 89 at times on RA while pt is sleeping. Pt placed on 2L O2 via [place]. NAD. Pt easily aroused to verbal stimuli.
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
## 2011-08-28 NOTE — ED Notes (Signed)
Pt is lethargic but easily aroused verbally. Pt answers questions appropriately. NAD. O2 removed. O2 sat of 94% RA

## 2011-08-28 NOTE — ED Notes (Signed)
Pt report attempted. Nurse to call back.
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
## 2011-08-28 NOTE — ED Notes (Signed)
Phoned AC for Tamiflu. Flu swab sent to lab.

## 2011-08-28 NOTE — ED Notes (Signed)
Pt to xray

## 2011-08-29 ENCOUNTER — Inpatient Hospital Stay (HOSPITAL_COMMUNITY): Payer: Medicare HMO

## 2011-08-29 LAB — CBC
HCT: 32.7 % — ABNORMAL LOW (ref 39.0–52.0)
Hemoglobin: 11.1 g/dL — ABNORMAL LOW (ref 13.0–17.0)
MCH: 32 pg (ref 26.0–34.0)
MCV: 94.2 fL (ref 78.0–100.0)
Platelets: 130 10*3/uL — ABNORMAL LOW (ref 150–400)
RBC: 3.47 MIL/uL — ABNORMAL LOW (ref 4.22–5.81)
WBC: 9 10*3/uL (ref 4.0–10.5)

## 2011-08-29 LAB — MAGNESIUM: Magnesium: 2 mg/dL (ref 1.5–2.5)

## 2011-08-29 LAB — COMPREHENSIVE METABOLIC PANEL
AST: 19 U/L (ref 0–37)
Albumin: 2.7 g/dL — ABNORMAL LOW (ref 3.5–5.2)
BUN: 22 mg/dL (ref 6–23)
Chloride: 103 mEq/L (ref 96–112)
Creatinine, Ser: 1.22 mg/dL (ref 0.50–1.35)
Total Bilirubin: 0.3 mg/dL (ref 0.3–1.2)
Total Protein: 5.4 g/dL — ABNORMAL LOW (ref 6.0–8.3)

## 2011-08-29 LAB — HEMOGLOBIN A1C
Hgb A1c MFr Bld: 6.2 % — ABNORMAL HIGH (ref <5.7)
Mean Plasma Glucose: 131 mg/dL — ABNORMAL HIGH (ref <117)

## 2011-08-29 LAB — TSH: TSH: 1.279 u[IU]/mL (ref 0.350–4.500)

## 2011-08-29 LAB — APTT: aPTT: 39 seconds — ABNORMAL HIGH (ref 24–37)

## 2011-08-29 MED ORDER — ALBUTEROL SULFATE (5 MG/ML) 0.5% IN NEBU
2.5000 mg | INHALATION_SOLUTION | Freq: Four times a day (QID) | RESPIRATORY_TRACT | Status: DC
  Administered 2011-08-29 – 2011-08-30 (×4): 2.5 mg via RESPIRATORY_TRACT
  Filled 2011-08-29 (×4): qty 0.5

## 2011-08-29 MED ORDER — IBUPROFEN 800 MG PO TABS: 400.0000 mg | ORAL_TABLET | Freq: Two times a day (BID) | ORAL | Status: DC

## 2011-08-29 MED ORDER — AMLODIPINE BESYLATE 5 MG PO TABS
5.0000 mg | ORAL_TABLET | Freq: Every day | ORAL | Status: DC
  Administered 2011-08-29 – 2011-08-30 (×2): 5 mg via ORAL
  Filled 2011-08-29 (×2): qty 1

## 2011-08-29 MED ORDER — POTASSIUM CHLORIDE IN NACL 40-0.9 MEQ/L-% IV SOLN
INTRAVENOUS | Status: AC
  Filled 2011-08-29: qty 2000

## 2011-08-29 MED ORDER — BIOTENE DRY MOUTH MT LIQD
15.0000 mL | Freq: Two times a day (BID) | OROMUCOSAL | Status: DC
  Administered 2011-08-29 – 2011-08-30 (×3): 15 mL via OROMUCOSAL

## 2011-08-29 MED ORDER — SODIUM CHLORIDE 0.9 % IJ SOLN
INTRAMUSCULAR | Status: AC
  Filled 2011-08-29: qty 3

## 2011-08-29 MED ORDER — POTASSIUM CHLORIDE CRYS ER 20 MEQ PO TBCR
40.0000 meq | EXTENDED_RELEASE_TABLET | Freq: Four times a day (QID) | ORAL | Status: DC
  Administered 2011-08-29 – 2011-08-30 (×5): 40 meq via ORAL
  Filled 2011-08-29 (×5): qty 2

## 2011-08-29 MED ORDER — POTASSIUM CHLORIDE IN NACL 40-0.9 MEQ/L-% IV SOLN
INTRAVENOUS | Status: DC
  Administered 2011-08-29 – 2011-08-30 (×3): via INTRAVENOUS
  Filled 2011-08-29 (×9): qty 1000

## 2011-08-29 MED ORDER — IBUPROFEN 800 MG PO TABS
400.0000 mg | ORAL_TABLET | Freq: Two times a day (BID) | ORAL | Status: DC
  Administered 2011-08-29 – 2011-08-30 (×2): 400 mg via ORAL
  Filled 2011-08-29 (×2): qty 1

## 2011-08-29 MED ORDER — WARFARIN SODIUM 7.5 MG PO TABS: 7.5000 mg | ORAL_TABLET | Freq: Once | ORAL | Status: DC

## 2011-08-29 NOTE — Progress Notes (Signed)
Chart reviewed.  Subjective: Doesn't feel well. Complaining of chronic back pain. Feels weak. Takes ibuprofen twice a day  Objective: Vital signs in last 24 hours: Filed Vitals:   08/28/11 1925 08/28/11 2044 08/29/11 0505 08/29/11 1447  BP: 95/53 115/52 111/59 163/71  Pulse: 62 62 72 73  Temp: 99.8 F (37.7 C) 98.3 F (36.8 C) 100.6 F (38.1 C) 100.8 F (38.2 C)  TempSrc: Oral Oral Oral Oral  Resp: 18 20 18 18   Height:  6' (1.829 m)    Weight:  102.4 kg (225 lb 12 oz)    SpO2: 97% 96% 94% 95%   Weight change:   Intake/Output Summary (Last 24 hours) at 08/29/11 1513 Last data filed at 08/29/11 1300  Gross per 24 hour  Intake 1083.33 ml  Output    625 ml  Net 458.33 ml   Physical Exam:  General: Ill-appearing. Appropriate. HEENT: Extremely hard of hearing. Dry mucous membranes. Lungs: Diminished throughout with bilateral expiratory wheeze, mild Cardiovascular regular rate rhythm without murmurs gallops rubs Abdomen soft nontender nondistended Extremities no clubbing cyanosis or edema  Lab Results: Basic Metabolic Panel:  Lab 08/29/11 1610 08/28/11 1632  NA 136 134*  K 2.9* 2.7*  CL 103 98  CO2 25 26  GLUCOSE 115* 232*  BUN 22 21  CREATININE 1.22 1.40*  CALCIUM 8.7 8.9  MG 2.0 1.9  PHOS -- --   Liver Function Tests:  Lab 08/29/11 0536  AST 19  ALT 17  ALKPHOS 34*  BILITOT 0.3  PROT 5.4*  ALBUMIN 2.7*   No results found for this basename: LIPASE:2,AMYLASE:2 in the last 168 hours No results found for this basename: AMMONIA:2 in the last 168 hours CBC:  Lab 08/29/11 0536 08/28/11 1632  WBC 9.0 7.8  NEUTROABS -- 6.3  HGB 11.1* 11.7*  HCT 32.7* 34.0*  MCV 94.2 93.9  PLT 130* 146*   Cardiac Enzymes: No results found for this basename: CKTOTAL:3,CKMB:3,CKMBINDEX:3,TROPONINI:3 in the last 168 hours BNP: No results found for this basename: PROBNP:3 in the last 168 hours D-Dimer: No results found for this basename: DDIMER:2 in the last 168  hours CBG: No results found for this basename: GLUCAP:6 in the last 168 hours Hemoglobin A1C: No results found for this basename: HGBA1C in the last 168 hours Fasting Lipid Panel: No results found for this basename: CHOL,HDL,LDLCALC,TRIG,CHOLHDL,LDLDIRECT in the last 960 hours Thyroid Function Tests: No results found for this basename: TSH,T4TOTAL,FREET4,T3FREE,THYROIDAB in the last 168 hours Coagulation:  Lab 08/29/11 1155 08/29/11 0536 08/28/11 1632  LABPROT 15.3* 15.1 29.4*  INR 1.18 1.17 2.73*    Micro Results: No results found for this or any previous visit (from the past 240 hour(s)). Studies/Results: Dg Chest 2 View  08/28/2011  *RADIOLOGY REPORT*  Clinical Data: Productive cough, fever.  CHEST - 2 VIEW  Comparison: CT 12/01/2010  Findings: There is mild cardiomegaly.  Large hiatal hernia.  Lungs are clear.  No effusions or acute bony abnormality.  IMPRESSION: Cardiomegaly, large hiatal hernia.  Original Report Authenticated By: Cyndie Chime, M.D.   Scheduled Meds:   . acetaminophen  650 mg Oral Once  . antiseptic oral rinse  15 mL Mouth Rinse BID  . aspirin  325 mg Oral Daily  . cefTRIAXone (ROCEPHIN)  IV  1 g Intravenous Once  . docusate sodium  100 mg Oral BID  . fenofibrate  160 mg Oral Daily  . gabapentin  300 mg Oral TID  . ibuprofen  400 mg Oral BID  .  moxifloxacin  400 mg Intravenous Once  . oseltamivir  75 mg Oral Once  . oseltamivir  75 mg Oral BID  . potassium chloride  10 mEq Intravenous Q1 Hr x 2  . potassium chloride SA  40 mEq Oral Once  . potassium chloride  40 mEq Oral QID  . pregabalin  75 mg Oral BID  . simvastatin  20 mg Oral q1800  . sodium chloride  500 mL Intravenous Once  . sodium chloride      . tolterodine  4 mg Oral Daily  . warfarin  5 mg Oral Once  . DISCONTD: ibuprofen  400 mg Oral BID  . DISCONTD: moxifloxacin  400 mg Intravenous Q24H  . DISCONTD: potassium chloride  40 mEq Oral Once  . DISCONTD: warfarin  7.5 mg Oral ONCE-1800    Continuous Infusions:   . 0.9 % NaCl with KCl 20 mEq / L 100 mL/hr at 08/28/11 2251  . 0.9 % NaCl with KCl 40 mEq / L    . DISCONTD: sodium chloride 125 mL/hr at 08/29/11 0958   PRN Meds:.acetaminophen, albuterol, diazepam, ondansetron (ZOFRAN) IV, ondansetron, DISCONTD: acetaminophen Assessment/Plan: Principal Problem:  *Influenza A Active Problems:  Hypokalemia  HYPERTENSION  BPH (benign prostatic hyperplasia)  Peripheral neuropathy  Stroke  Hyperlipidemia  ARF (acute renal failure)  Potassium is still low. Will replete IV and by mouth. Magnesium is normal. Patient's Coumadin was stopped last year. He remains on aspirin. He's wheezing I will add bronchodilators. No need for antibiotics as he has influenza A. Continue IV fluids. Clinically he still dehydrated. His blood pressure is increasing. Will add antihypertensives back slowly. Repeat blood work Advertising account executive. Resume ibuprofen.   LOS: 1 day   Edric Fetterman L 08/29/2011, 3:13 PM
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
## 2011-08-30 LAB — URINALYSIS, ROUTINE W REFLEX MICROSCOPIC
Glucose, UA: NEGATIVE mg/dL
Protein, ur: NEGATIVE mg/dL

## 2011-08-30 LAB — BASIC METABOLIC PANEL
BUN: 12 mg/dL (ref 6–23)
CO2: 26 mEq/L (ref 19–32)
Chloride: 111 mEq/L (ref 96–112)
Glucose, Bld: 139 mg/dL — ABNORMAL HIGH (ref 70–99)
Potassium: 3.9 mEq/L (ref 3.5–5.1)
Sodium: 143 mEq/L (ref 135–145)

## 2011-08-30 MED ORDER — ALBUTEROL 90 MCG/ACT IN AERS: 2.0000 | INHALATION_SPRAY | RESPIRATORY_TRACT | Status: DC | PRN

## 2011-08-30 MED ORDER — ACETAMINOPHEN 325 MG PO TABS: 650.0000 mg | ORAL_TABLET | Freq: Four times a day (QID) | ORAL | Status: AC | PRN

## 2011-08-30 MED ORDER — OSELTAMIVIR PHOSPHATE 75 MG PO CAPS: 75.0000 mg | ORAL_CAPSULE | Freq: Two times a day (BID) | ORAL | Status: AC

## 2011-08-30 NOTE — Discharge Summary (Signed)
Physician Discharge Summary  Patient ID: NYCHOLAS RAYNER MRN: [id number] DOB/AGE: 1936/07/25 75 y.o.  Admit date: 08/28/2011 Discharge date: 08/30/2011  Discharge Diagnoses:  Principal Problem:  *Influenza A Active Problems:  Hypokalemia  HYPERTENSION  BPH (benign prostatic hyperplasia)  Peripheral neuropathy  Stroke  Hyperlipidemia  ARF (acute renal failure)   Current Discharge Medication List    START taking these medications   Details  acetaminophen (TYLENOL) 325 MG tablet Take 2 tablets (650 mg total) by mouth every 6 (six) hours as needed for pain or fever (or Fever >/= 101). Qty: 30 tablet    albuterol (PROVENTIL,VENTOLIN) 90 MCG/ACT inhaler Inhale 2 puffs into the lungs every 4 (four) hours as needed for wheezing. Qty: 1 each, Refills: 0    oseltamivir (TAMIFLU) 75 MG capsule Take 1 capsule (75 mg total) by mouth 2 (two) times daily. Qty: 7 capsule, Refills: 0      CONTINUE these medications which have NOT CHANGED   Details  amLODipine (NORVASC) 5 MG tablet Take 5 mg by mouth daily.      aspirin 325 MG EC tablet Take 325 mg by mouth daily.      benazepril (LOTENSIN) 40 MG tablet Take 40 mg by mouth daily.      calcium carbonate (OS-CAL) 600 MG TABS Take 600 mg by mouth 2 (two) times daily with a meal.      Cholecalciferol (VITAMIN D) 1000 UNITS capsule Take 1,000 Units by mouth daily.      diazepam (VALIUM) 5 MG tablet Take 5 mg by mouth at bedtime as needed.     fenofibrate 160 MG tablet Take 160 mg by mouth daily.      fish oil-omega-3 fatty acids 1000 MG capsule Take 1 g by mouth 2 (two) times daily.      gabapentin (NEURONTIN) 300 MG capsule Take 300 mg by mouth 3 (three) times daily.      potassium chloride SA (K-DUR,KLOR-CON) 20 MEQ tablet Take 20 mEq by mouth daily.      pravastatin (PRAVACHOL) 40 MG tablet Take 40 mg by mouth daily.      pregabalin (LYRICA) 75 MG capsule Take 75 mg by mouth 2 (two) times daily. Take 1 in AM and 2 at night       tolterodine (DETROL LA) 4 MG 24 hr capsule Take 4 mg by mouth daily.        STOP taking these medications     hydrochlorothiazide 25 MG tablet      warfarin (COUMADIN) 5 MG tablet         Discharge Orders    Future Orders Please Complete By Expires   Diet - low sodium heart healthy      Increase activity slowly      Discharge instructions      Comments:   Drink plenty of liquid.  Stop hydrochlorothiazide for one week then resume.   Call MD for:  extreme fatigue      Call MD for:  persistant dizziness or light-headedness         Follow-up Information    Follow up with Monica Becton, MD .         Disposition: home  Discharged Condition: stable  Consults:  PT  Labs:   Results for orders placed during the hospital encounter of 08/28/11 (from the past 48 hour(s))  CBC     Status: Abnormal   Collection Time   08/28/11  4:32 PM      Component Value  Range Comment   WBC 7.8  4.0 - 10.5 (K/uL)    RBC 3.62 (*) 4.22 - 5.81 (MIL/uL)    Hemoglobin 11.7 (*) 13.0 - 17.0 (g/dL)    HCT 45.4 (*) 09.8 - 52.0 (%)    MCV 93.9  78.0 - 100.0 (fL)    MCH 32.3  26.0 - 34.0 (pg)    MCHC 34.4  30.0 - 36.0 (g/dL)    RDW 11.9  14.7 - 82.9 (%)    Platelets 146 (*) 150 - 400 (K/uL)   DIFFERENTIAL     Status: Abnormal   Collection Time   08/28/11  4:32 PM      Component Value Range Comment   Neutrophils Relative 80 (*) 43 - 77 (%)    Neutro Abs 6.3  1.7 - 7.7 (K/uL)    Lymphocytes Relative 12  12 - 46 (%)    Lymphs Abs 1.0  0.7 - 4.0 (K/uL)    Monocytes Relative 7  3 - 12 (%)    Monocytes Absolute 0.6  0.1 - 1.0 (K/uL)    Eosinophils Relative 0  0 - 5 (%)    Eosinophils Absolute 0.0  0.0 - 0.7 (K/uL)    Basophils Relative 0  0 - 1 (%)    Basophils Absolute 0.0  0.0 - 0.1 (K/uL)   BASIC METABOLIC PANEL     Status: Abnormal   Collection Time   08/28/11  4:32 PM      Component Value Range Comment   Sodium 134 (*) 135 - 145 (mEq/L)    Potassium 2.7 (*) 3.5 - 5.1 (mEq/L)     Chloride 98  96 - 112 (mEq/L)    CO2 26  19 - 32 (mEq/L)    Glucose, Bld 232 (*) 70 - 99 (mg/dL)    BUN 21  6 - 23 (mg/dL)    Creatinine, Ser 5.62 (*) 0.50 - 1.35 (mg/dL)    Calcium 8.9  8.4 - 10.5 (mg/dL)    GFR calc non Af Amer 48 (*) >90 (mL/min)    GFR calc Af Amer 56 (*) >90 (mL/min)   MAGNESIUM     Status: Normal   Collection Time   08/28/11  4:32 PM      Component Value Range Comment   Magnesium 1.9  1.5 - 2.5 (mg/dL)   PROTIME-INR     Status: Abnormal   Collection Time   08/28/11  4:32 PM      Component Value Range Comment   Prothrombin Time 29.4 (*) 11.6 - 15.2 (seconds)    INR 2.73 (*) 0.00 - 1.49    TSH     Status: Normal   Collection Time   08/28/11  4:32 PM      Component Value Range Comment   TSH 1.279  0.350 - 4.500 (uIU/mL)   LACTIC ACID, PLASMA     Status: Normal   Collection Time   08/28/11  4:32 PM      Component Value Range Comment   Lactic Acid, Venous 2.1  0.5 - 2.2 (mmol/L)   INFLUENZA PANEL BY PCR     Status: Abnormal   Collection Time   08/28/11  5:55 PM      Component Value Range Comment   Influenza A By PCR POSITIVE (*) NEGATIVE     Influenza B By PCR NEGATIVE  NEGATIVE     H1N1 flu by pcr NOT DETECTED  NOT DETECTED    COMPREHENSIVE METABOLIC PANEL  Status: Abnormal   Collection Time   08/29/11  5:36 AM      Component Value Range Comment   Sodium 136  135 - 145 (mEq/L)    Potassium 2.9 (*) 3.5 - 5.1 (mEq/L)    Chloride 103  96 - 112 (mEq/L)    CO2 25  19 - 32 (mEq/L)    Glucose, Bld 115 (*) 70 - 99 (mg/dL)    BUN 22  6 - 23 (mg/dL)    Creatinine, Ser 1.61  0.50 - 1.35 (mg/dL)    Calcium 8.7  8.4 - 10.5 (mg/dL)    Total Protein 5.4 (*) 6.0 - 8.3 (g/dL)    Albumin 2.7 (*) 3.5 - 5.2 (g/dL)    AST 19  0 - 37 (U/L)    ALT 17  0 - 53 (U/L)    Alkaline Phosphatase 34 (*) 39 - 117 (U/L)    Total Bilirubin 0.3  0.3 - 1.2 (mg/dL)    GFR calc non Af Amer 57 (*) >90 (mL/min)    GFR calc Af Amer 66 (*) >90 (mL/min)   CBC     Status: Abnormal    Collection Time   08/29/11  5:36 AM      Component Value Range Comment   WBC 9.0  4.0 - 10.5 (K/uL)    RBC 3.47 (*) 4.22 - 5.81 (MIL/uL)    Hemoglobin 11.1 (*) 13.0 - 17.0 (g/dL)    HCT 09.6 (*) 04.5 - 52.0 (%)    MCV 94.2  78.0 - 100.0 (fL)    MCH 32.0  26.0 - 34.0 (pg)    MCHC 33.9  30.0 - 36.0 (g/dL)    RDW 40.9  81.1 - 91.4 (%)    Platelets 130 (*) 150 - 400 (K/uL)   PROTIME-INR     Status: Normal   Collection Time   08/29/11  5:36 AM      Component Value Range Comment   Prothrombin Time 15.1  11.6 - 15.2 (seconds)    INR 1.17  0.00 - 1.49    APTT     Status: Abnormal   Collection Time   08/29/11  5:36 AM      Component Value Range Comment   aPTT 39 (*) 24 - 37 (seconds)   MAGNESIUM     Status: Normal   Collection Time   08/29/11  5:36 AM      Component Value Range Comment   Magnesium 2.0  1.5 - 2.5 (mg/dL)   HEMOGLOBIN N8G     Status: Abnormal   Collection Time   08/29/11  5:36 AM      Component Value Range Comment   Hemoglobin A1C 6.2 (*) <5.7 (%)    Mean Plasma Glucose 131 (*) <117 (mg/dL)   PROTIME-INR     Status: Abnormal   Collection Time   08/29/11 11:55 AM      Component Value Range Comment   Prothrombin Time 15.3 (*) 11.6 - 15.2 (seconds)    INR 1.18  0.00 - 1.49    BASIC METABOLIC PANEL     Status: Abnormal   Collection Time   08/30/11  4:58 AM      Component Value Range Comment   Sodium 143  135 - 145 (mEq/L)    Potassium 3.9  3.5 - 5.1 (mEq/L)    Chloride 111  96 - 112 (mEq/L)    CO2 26  19 - 32 (mEq/L)    Glucose, Bld 139 (*) 70 -  99 (mg/dL)    BUN 12  6 - 23 (mg/dL)    Creatinine, Ser 1.61  0.50 - 1.35 (mg/dL)    Calcium 8.6  8.4 - 10.5 (mg/dL)    GFR calc non Af Amer 81 (*) >90 (mL/min)    GFR calc Af Amer >90  >90 (mL/min)   URINALYSIS, ROUTINE W REFLEX MICROSCOPIC     Status: Abnormal   Collection Time   08/30/11  5:40 AM      Component Value Range Comment   Color, Urine YELLOW  YELLOW     APPearance CLEAR  CLEAR     Specific Gravity,  Urine 1.015  1.005 - 1.030     pH 6.5  5.0 - 8.0     Glucose, UA NEGATIVE  NEGATIVE (mg/dL)    Hgb urine dipstick TRACE (*) NEGATIVE     Bilirubin Urine NEGATIVE  NEGATIVE     Ketones, ur NEGATIVE  NEGATIVE (mg/dL)    Protein, ur NEGATIVE  NEGATIVE (mg/dL)    Urobilinogen, UA 0.2  0.0 - 1.0 (mg/dL)    Nitrite NEGATIVE  NEGATIVE     Leukocytes, UA NEGATIVE  NEGATIVE    URINE MICROSCOPIC-ADD ON     Status: Normal   Collection Time   08/30/11  5:40 AM      Component Value Range Comment   RBC / HPF 0-2  <3 (RBC/hpf)     Diagnostics:  X-ray Chest Pa And Lateral   08/29/2011  *RADIOLOGY REPORT*  Clinical Data: Cough.  CHEST - 2 VIEW  Comparison: PA and lateral chest 08/28/2011.  Findings: Lungs are clear.  Heart size is normal.  Tiny pleural effusion on the right is noted.  Large hiatal hernia is seen.  IMPRESSION:  1.  Tiny right pleural effusion. 2.  Large hiatal hernia.  Original Report Authenticated By: Bernadene Bell. Maricela Curet, M.D.   Dg Chest 2 View  08/28/2011  *RADIOLOGY REPORT*  Clinical Data: Productive cough, fever.  CHEST - 2 VIEW  Comparison: CT 12/01/2010  Findings: There is mild cardiomegaly.  Large hiatal hernia.  Lungs are clear.  No effusions or acute bony abnormality.  IMPRESSION: Cardiomegaly, large hiatal hernia.  Original Report Authenticated By: Cyndie Chime, M.D.   Dg Hip Complete Left  08/04/2011  *RADIOLOGY REPORT*  Clinical Data: Left growing pain for 1 month  LEFT HIP - COMPLETE 2+ VIEW  Comparison: None.  Findings: No fracture or dislocation of the left hip.  There is narrowing of the medial joint space and superior joint space with mild sclerosis of the acetabular roof.  IMPRESSION: No acute findings.  Mild osteoarthritis of the left hip.  Original Report Authenticated By: Genevive Bi, M.D.   Full Code   Hospital Course: See H&P for complete admission details. The patient is a 75 year old white male Who presented to the emergency room with fever and cough. He  was weak and unable to get out of bed. He was unable to give much history. fever of up to 104 at home. In the emergency room, he had a temperature of 99.8. Blood pressure was 95/53. Oxygen saturation respiratory rate were normal. He had dry mucous membranes. Initially he had clear lungs but developed wheezing during the hospitalization. Chest x-ray was negative. He was admitted for workup and treatment of his fever. Flu PCR came back positive for influenza A and he was started on Tamiflu. He was profoundly hypokalemic and this was corrected. He was given IV fluids for his dehydration. He  improved quickly and at the time of discharge, he had clear lung sounds, was ambulating, afebrile, tolerating a diet and much brighter. He is essentially back to his baseline. He has no previous history of asthma or COPD. I will send him home with albuterol MDI to take as needed.  Disis in cis and in in harge Exam:  Blood pressure 149/76, pulse 72, temperature 98 F (36.7 C), temperature source Oral, resp. rate 18, height 6' (1.829 m), weight 102.4 kg (225 lb 12 oz), SpO2 95.00%.  General: Much brighter. Ambulating around the room combing his hair. Lungs clear auscultation bilaterally without wheeze rhonchi or rales HEENT moist mucous membranes Cardiovascular regular rate rhythm without murmurs gallops rubs Extremities no clubbing cyanosis or edema   Signed: Safiatou Islam L 08/30/2011, 2:19 PM
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
## 2011-08-30 NOTE — Progress Notes (Signed)
08/30/11 1522 Patient being discharged home. IV site d/c'd, site within normal limits, d/c'd telemetry. Reviewed discharge instructions with patient, son at bedside. Given copy of instructions, med list, f/u appointment information, prescriptions. Verbalized understanding of instructions. Pt in stable condition awaiting w/c transport for discharge.

## 2011-09-01 LAB — URINE CULTURE
Culture  Setup Time: 201212250108
Culture: NO GROWTH

## 2012-06-20 ENCOUNTER — Ambulatory Visit (INDEPENDENT_AMBULATORY_CARE_PROVIDER_SITE_OTHER): Payer: Medicare HMO | Admitting: Physician Assistant

## 2012-06-20 ENCOUNTER — Encounter: Payer: Self-pay | Admitting: Physician Assistant

## 2012-06-20 VITALS — BP 131/88 | HR 67 | Ht 72.0 in | Wt 215.0 lb

## 2012-06-20 DIAGNOSIS — E1140 Type 2 diabetes mellitus with diabetic neuropathy, unspecified: Secondary | ICD-10-CM

## 2012-06-20 DIAGNOSIS — E119 Type 2 diabetes mellitus without complications: Secondary | ICD-10-CM

## 2012-06-20 DIAGNOSIS — R42 Dizziness and giddiness: Secondary | ICD-10-CM

## 2012-06-20 DIAGNOSIS — R0609 Other forms of dyspnea: Secondary | ICD-10-CM

## 2012-06-20 DIAGNOSIS — I10 Essential (primary) hypertension: Secondary | ICD-10-CM

## 2012-06-20 HISTORY — DX: Type 2 diabetes mellitus with diabetic neuropathy, unspecified: E11.40

## 2012-06-20 NOTE — Assessment & Plan Note (Signed)
Stable, followed by primary M.D. 

## 2012-06-20 NOTE — Assessment & Plan Note (Signed)
Followed by primary M.D. 

## 2012-06-20 NOTE — Patient Instructions (Signed)
   Echo  Labs:  CMET, CBC, TSH, BNP  7 day heart monitor - will be mailed to your home   Office will contact with results  Follow up in  1 month
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
## 2012-06-20 NOTE — Assessment & Plan Note (Addendum)
We'll order a baseline 2-D echocardiogram for assessment of LVF and rule out of any underlying structural abnormalities. If this is within normal limits, then I would suggest proceeding with a stress Myoview study for risk stratification. If, however, this yields LV dysfunction, or evidence of focal WMAs, then I would strongly consider more aggressive evaluation with a cardiac catheterization. Of note, patient has CRFs notable for HTN, DM, and age.

## 2012-06-20 NOTE — Assessment & Plan Note (Signed)
Will check orthostatic BPs here in the office. Patient does present with some postural symptoms, upon standing. However, he also suggests that these symptoms can occur when laying down. There is no suggestion of vertigo. He was recently placed on meclizine, which initially helped. We will also order a 1 week event monitor to rule out dysrhythmia as possible etiology of his symptoms, particularly given the previously documented atrial/ventricular ectopy, by GXT. Moreover, he suggests recent low pulse readings.

## 2012-06-22 ENCOUNTER — Encounter: Payer: Self-pay | Admitting: Cardiology

## 2012-06-22 ENCOUNTER — Other Ambulatory Visit (INDEPENDENT_AMBULATORY_CARE_PROVIDER_SITE_OTHER): Payer: Medicare HMO

## 2012-06-22 ENCOUNTER — Other Ambulatory Visit: Payer: Self-pay

## 2012-06-22 DIAGNOSIS — R42 Dizziness and giddiness: Secondary | ICD-10-CM

## 2012-06-22 DIAGNOSIS — R0609 Other forms of dyspnea: Secondary | ICD-10-CM

## 2012-06-23 ENCOUNTER — Other Ambulatory Visit: Payer: Self-pay | Admitting: *Deleted

## 2012-06-23 DIAGNOSIS — R42 Dizziness and giddiness: Secondary | ICD-10-CM

## 2012-06-26 ENCOUNTER — Encounter: Payer: Self-pay | Admitting: *Deleted

## 2012-06-28 DIAGNOSIS — R42 Dizziness and giddiness: Secondary | ICD-10-CM

## 2012-07-12 ENCOUNTER — Institutional Professional Consult (permissible substitution): Payer: Medicare HMO | Admitting: Cardiology

## 2012-07-14 ENCOUNTER — Ambulatory Visit (INDEPENDENT_AMBULATORY_CARE_PROVIDER_SITE_OTHER): Payer: Medicare HMO | Admitting: Cardiology

## 2012-07-14 ENCOUNTER — Encounter: Payer: Self-pay | Admitting: Cardiology

## 2012-07-14 VITALS — BP 159/78 | HR 46 | Ht 72.0 in | Wt 217.8 lb

## 2012-07-14 DIAGNOSIS — R0609 Other forms of dyspnea: Secondary | ICD-10-CM

## 2012-07-14 DIAGNOSIS — I10 Essential (primary) hypertension: Secondary | ICD-10-CM

## 2012-07-14 DIAGNOSIS — R0989 Other specified symptoms and signs involving the circulatory and respiratory systems: Secondary | ICD-10-CM

## 2012-07-14 MED ORDER — AMLODIPINE BESYLATE 10 MG PO TABS: 10.0000 mg | ORAL_TABLET | Freq: Every day | ORAL | Status: DC

## 2012-07-14 MED ORDER — POTASSIUM CHLORIDE CRYS ER 20 MEQ PO TBCR: 20.0000 meq | EXTENDED_RELEASE_TABLET | Freq: Two times a day (BID) | ORAL | Status: DC

## 2012-07-14 NOTE — Patient Instructions (Addendum)
Your physician recommends that you schedule a follow-up appointment in: 2 months at the [facility] office. Your physician recommends that you continue on your current medications as directed. Please refer to the Current Medication list given to you today.  You will be started on warfarin. You will be given an appointment at [facility]. You will be informed about this appointment next week. If you don't receive a call on Monday, please call our office.
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
## 2012-07-18 ENCOUNTER — Telehealth: Payer: Self-pay | Admitting: *Deleted

## 2012-07-18 MED ORDER — WARFARIN SODIUM 5 MG PO TABS: 5.0000 mg | ORAL_TABLET | Freq: Every day | ORAL | Status: DC

## 2012-07-18 NOTE — Telephone Encounter (Signed)
Patient and Toniann Fail at [facility],The informed that rx for coumadin sent to pharmacy per Dr. Antoine Poche.

## 2012-07-18 NOTE — Telephone Encounter (Signed)
Spoke with Toniann Fail and she said they would be happy to manage coumadin for patient but our office need to send the prescription.

## 2012-07-20 ENCOUNTER — Other Ambulatory Visit: Payer: Self-pay | Admitting: *Deleted

## 2012-07-20 MED ORDER — WARFARIN SODIUM 5 MG PO TABS: 5.0000 mg | ORAL_TABLET | Freq: Every day | ORAL | Status: DC

## 2012-08-10 ENCOUNTER — Ambulatory Visit (INDEPENDENT_AMBULATORY_CARE_PROVIDER_SITE_OTHER): Payer: Medicare HMO | Admitting: Cardiology

## 2012-08-10 ENCOUNTER — Encounter: Payer: Self-pay | Admitting: Cardiology

## 2012-08-10 ENCOUNTER — Encounter: Payer: Self-pay | Admitting: *Deleted

## 2012-08-10 VITALS — BP 135/72 | HR 70 | Ht 72.0 in | Wt 218.8 lb

## 2012-08-10 DIAGNOSIS — R42 Dizziness and giddiness: Secondary | ICD-10-CM

## 2012-08-10 DIAGNOSIS — E663 Overweight: Secondary | ICD-10-CM

## 2012-08-10 DIAGNOSIS — N179 Acute kidney failure, unspecified: Secondary | ICD-10-CM

## 2012-08-10 DIAGNOSIS — I10 Essential (primary) hypertension: Secondary | ICD-10-CM

## 2012-08-10 DIAGNOSIS — E785 Hyperlipidemia, unspecified: Secondary | ICD-10-CM

## 2012-08-10 DIAGNOSIS — R0609 Other forms of dyspnea: Secondary | ICD-10-CM

## 2012-08-10 DIAGNOSIS — R0600 Dyspnea, unspecified: Secondary | ICD-10-CM

## 2012-08-10 DIAGNOSIS — R0989 Other specified symptoms and signs involving the circulatory and respiratory systems: Secondary | ICD-10-CM

## 2012-08-10 NOTE — Patient Instructions (Addendum)
Your physician recommends that you schedule a follow-up appointment in: 2 months. Your physician has recommended you make the following change in your medication: Please hold your warfarin for 2 days then, Decrease to  warfarin to 1/2 tablet on Monday,Wednesday and Friday and 1 whole tablet on other days. Please have your INR checked in 10 days at [facility] coumadin clinic. All other medications will remain the same. Your physician has requested that you have a lexiscan myoview. For further information please visit [email or web address] Please follow instruction sheet, as given. Please have your INR checked in 10 days at [facility] coumadin clinic. 08/18/12 @10 :00 am with Johny Chess. Patient aware. You have been referred to Dr. Andrey Campanile and ENT physician. You will be contacted when this is scheduled.
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
## 2012-08-14 DIAGNOSIS — R079 Chest pain, unspecified: Secondary | ICD-10-CM

## 2012-08-18 ENCOUNTER — Telehealth: Payer: Self-pay | Admitting: *Deleted

## 2012-08-18 NOTE — Telephone Encounter (Signed)
Message copied by Eustace Moore on Fri Aug 18, 2012  9:56 AM ------      Message from: Rollene Rotunda      Created: Thu Aug 17, 2012 10:01 AM       Low risk nuclear study.  No clear etiology for dyspnea.  No further testing.  Call Mr. Gaulin with the results and send results to Rudi Heap, MD

## 2012-08-18 NOTE — Telephone Encounter (Signed)
Patient informed. 

## 2012-11-03 ENCOUNTER — Ambulatory Visit (INDEPENDENT_AMBULATORY_CARE_PROVIDER_SITE_OTHER): Payer: Medicare HMO | Admitting: Cardiology

## 2012-11-03 ENCOUNTER — Encounter: Payer: Self-pay | Admitting: Cardiology

## 2012-11-03 VITALS — BP 155/71 | HR 55 | Ht 72.0 in | Wt 222.0 lb

## 2012-11-03 DIAGNOSIS — R42 Dizziness and giddiness: Secondary | ICD-10-CM

## 2012-11-03 NOTE — Patient Instructions (Addendum)
Your physician recommends that you schedule a follow-up appointment in: 6 months. You will receive a reminder letter in the mail in about 4 months reminding you to call and schedule your appointment. If you don't receive this letter, please contact our office. Your physician recommends that you continue on your current medications as directed. Please refer to the Current Medication list given to you today. You may take an extra furosemide 20 pill for lower extremity swelling. If you have to do this, please take 1 extra potassium pill as well.
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
## 2012-11-21 ENCOUNTER — Other Ambulatory Visit: Payer: Self-pay | Admitting: Nurse Practitioner

## 2012-11-21 DIAGNOSIS — I10 Essential (primary) hypertension: Secondary | ICD-10-CM

## 2012-11-21 MED ORDER — BENAZEPRIL HCL 40 MG PO TABS: 40.0000 mg | ORAL_TABLET | Freq: Two times a day (BID) | ORAL | Status: DC

## 2012-12-25 ENCOUNTER — Telehealth: Payer: Self-pay | Admitting: Nurse Practitioner

## 2012-12-26 ENCOUNTER — Other Ambulatory Visit: Payer: Self-pay | Admitting: *Deleted

## 2012-12-26 ENCOUNTER — Other Ambulatory Visit: Payer: Self-pay

## 2012-12-26 DIAGNOSIS — I10 Essential (primary) hypertension: Secondary | ICD-10-CM

## 2012-12-26 MED ORDER — MECLIZINE HCL 25 MG PO TABS: 25.0000 mg | ORAL_TABLET | Freq: Three times a day (TID) | ORAL | Status: DC | PRN

## 2012-12-26 MED ORDER — AMLODIPINE BESYLATE 10 MG PO TABS: 10.0000 mg | ORAL_TABLET | Freq: Every day | ORAL | Status: DC

## 2012-12-26 MED ORDER — FUROSEMIDE 20 MG PO TABS: 20.0000 mg | ORAL_TABLET | Freq: Every day | ORAL | Status: DC

## 2012-12-26 MED ORDER — FENOFIBRATE 160 MG PO TABS: 160.0000 mg | ORAL_TABLET | Freq: Every day | ORAL | Status: DC

## 2012-12-26 MED ORDER — POTASSIUM CHLORIDE CRYS ER 20 MEQ PO TBCR: 20.0000 meq | EXTENDED_RELEASE_TABLET | Freq: Two times a day (BID) | ORAL | Status: DC

## 2012-12-26 MED ORDER — METFORMIN HCL 500 MG PO TABS: 500.0000 mg | ORAL_TABLET | Freq: Two times a day (BID) | ORAL | Status: DC

## 2012-12-26 MED ORDER — PRAVASTATIN SODIUM 40 MG PO TABS: 40.0000 mg | ORAL_TABLET | Freq: Every day | ORAL | Status: DC

## 2012-12-26 MED ORDER — BENAZEPRIL HCL 40 MG PO TABS: 40.0000 mg | ORAL_TABLET | Freq: Two times a day (BID) | ORAL | Status: DC

## 2012-12-26 NOTE — Telephone Encounter (Signed)
Written rx's at front for pt:  Antivert,   Pravachol,   Lasix,   Lotwensin,   Norvasc,   Metformin,   K-Dur,   Fenofibrate. Pt aware.

## 2012-12-26 NOTE — Telephone Encounter (Signed)
Last seen 09/28/12   Last labs 09/28/12    Print for mail order and have nurse call patient to pick up

## 2013-02-21 ENCOUNTER — Other Ambulatory Visit: Payer: Self-pay | Admitting: *Deleted

## 2013-02-21 MED ORDER — GABAPENTIN 400 MG PO CAPS: 800.0000 mg | ORAL_CAPSULE | Freq: Two times a day (BID) | ORAL | Status: DC

## 2013-03-08 ENCOUNTER — Other Ambulatory Visit: Payer: Self-pay | Admitting: *Deleted

## 2013-03-08 NOTE — Telephone Encounter (Signed)
Patient last seen in office on 09-28-12. Please advise

## 2013-03-12 MED ORDER — MECLIZINE HCL 25 MG PO TABS: 25.0000 mg | ORAL_TABLET | Freq: Three times a day (TID) | ORAL | Status: DC | PRN

## 2013-03-12 MED ORDER — FUROSEMIDE 20 MG PO TABS: 20.0000 mg | ORAL_TABLET | Freq: Every day | ORAL | Status: DC

## 2013-03-29 ENCOUNTER — Encounter: Payer: Self-pay | Admitting: Cardiology

## 2013-04-12 ENCOUNTER — Other Ambulatory Visit: Payer: Self-pay | Admitting: Nurse Practitioner

## 2013-04-13 NOTE — Telephone Encounter (Signed)
Last seen:09/28/12

## 2013-04-27 ENCOUNTER — Other Ambulatory Visit: Payer: Self-pay

## 2013-04-27 DIAGNOSIS — I10 Essential (primary) hypertension: Secondary | ICD-10-CM

## 2013-04-27 MED ORDER — FUROSEMIDE 20 MG PO TABS: 20.0000 mg | ORAL_TABLET | Freq: Every day | ORAL | Status: DC

## 2013-04-27 MED ORDER — FENOFIBRATE 160 MG PO TABS: 160.0000 mg | ORAL_TABLET | Freq: Every day | ORAL | Status: DC

## 2013-04-27 MED ORDER — BENAZEPRIL HCL 40 MG PO TABS: 40.0000 mg | ORAL_TABLET | Freq: Two times a day (BID) | ORAL | Status: DC

## 2013-04-27 NOTE — Telephone Encounter (Signed)
Last seen 09/28/12 MMM  Last lipids 09/28/12  If approved print and have nurse call patient to pick up for mail order

## 2013-05-03 ENCOUNTER — Encounter: Payer: Self-pay | Admitting: Cardiology

## 2013-05-03 ENCOUNTER — Ambulatory Visit (INDEPENDENT_AMBULATORY_CARE_PROVIDER_SITE_OTHER): Payer: Medicare HMO | Admitting: Cardiology

## 2013-05-03 ENCOUNTER — Telehealth: Payer: Self-pay | Admitting: *Deleted

## 2013-05-03 VITALS — BP 146/80 | HR 55 | Ht 72.0 in | Wt 219.4 lb

## 2013-05-03 DIAGNOSIS — I10 Essential (primary) hypertension: Secondary | ICD-10-CM

## 2013-05-03 NOTE — Telephone Encounter (Signed)
Patient states his dose Coumadin 5 mg daily except on Mondays and Fridays 2.5 mg , he states Dr Charm Barges use to check it but the cost of lab draw was over $50.00 and he couldn't afford it, he has called Dr Buel Ream office, a prior doctor of his, to see if they will manage his coumadin due to finances, I have instructed him to call us if not we will need to manage him at our coumadin clinic, he saw Dr Antoine Poche today and he wanted it check.
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
## 2013-05-03 NOTE — Telephone Encounter (Signed)
Message copied by Carmela Hurt on Thu May 03, 2013 11:49 AM ------      Message from: Derry Lory A      Created: Thu May 03, 2013 10:43 AM      Regarding: FW: INR 2.8         I think this was sent to me by mistake      ----- Message -----         From: Eustace Moore, LPN         Sent: 05/03/2013  10:10 AM           To: Ovidio Kin, LPN      Subject: FW: INR 2.8                                                          ----- Message -----         From: Eustace Moore, LPN         Sent: 05/03/2013   9:29 AM           To: Eustace Moore, LPN      Subject: INR 2.8                                                  PATIENT HASN'T BEEN GETTING COUMADIN CHECKED REGULARLY. HOCHREIN  REQUEST IT BE CHECKED TODAY IN OFFICE DURING VISIT. ENCOUNTER CREATED.      PATIENT TAKES 1 TABLET DAILY EXCEPT 1/2 TABLET 2 DAYS PER WEEK. I INSTRUCTED HIM TO CONTINUE CURRENT DOSE UNTIL HE HEARS FURTHER FROM COUMADIN CLINIC            THANKS AND SEE YOU TOMORROW       ------

## 2013-05-03 NOTE — Patient Instructions (Addendum)
Your physician recommends that you schedule a follow-up appointment in: 3 months at the [facility] office. Your physician recommends that you continue on your current medications as directed. Please refer to the Current Medication list given to you today. You have been referred to coumadin clinic.

## 2013-05-04 NOTE — Telephone Encounter (Signed)
Will forward to Vashti Hey to f/u with pt

## 2013-05-04 NOTE — Telephone Encounter (Signed)
Spoke with pt, he states he never got a return call from Dr Buel Ream office to see if they will manage coumadin, he will try again today, if they will not manage coumadin pt will need f/u appt at a [place] coumadin clinic.

## 2013-05-15 ENCOUNTER — Other Ambulatory Visit: Payer: Self-pay | Admitting: Nurse Practitioner

## 2013-05-17 ENCOUNTER — Ambulatory Visit: Payer: Self-pay | Admitting: *Deleted

## 2013-05-17 NOTE — Telephone Encounter (Signed)
Confirmed with pt that he is scheduled to have coumadin managed at [facility].  Has an appt for 06/04/13. Will be dismissed from out coumadin clinic.

## 2013-05-17 NOTE — Telephone Encounter (Signed)
LAST OV 1/14. LAST AIC 6.5 ON 6/13. PRINT AND CALL PT TO PICKUP FOR MAIL ORDER.

## 2013-05-28 ENCOUNTER — Other Ambulatory Visit: Payer: Self-pay | Admitting: Nurse Practitioner

## 2013-06-04 ENCOUNTER — Ambulatory Visit (INDEPENDENT_AMBULATORY_CARE_PROVIDER_SITE_OTHER): Payer: Medicare HMO | Admitting: Pharmacist

## 2013-06-04 DIAGNOSIS — I4891 Unspecified atrial fibrillation: Secondary | ICD-10-CM

## 2013-06-04 NOTE — Patient Instructions (Signed)
Anticoagulation Dose Instructions as of 06/04/2013     Keith Doyle Tue Wed Thu Fri Sat   New Dose 5 mg 2.5 mg 5 mg 5 mg 5 mg 2.5 mg 5 mg    Description       Continue 1/2 tablet on mondays and fridays and 1 tablet all other days.      INR was 2.2 today

## 2013-06-19 ENCOUNTER — Other Ambulatory Visit: Payer: Self-pay

## 2013-06-19 MED ORDER — FUROSEMIDE 20 MG PO TABS: 20.0000 mg | ORAL_TABLET | Freq: Every day | ORAL | Status: DC

## 2013-06-19 NOTE — Telephone Encounter (Signed)
Up front 

## 2013-06-19 NOTE — Telephone Encounter (Signed)
Last seen 1/14 MMM If approved print for mail order and route to nurse

## 2013-06-19 NOTE — Telephone Encounter (Signed)
NTBS- ok to let b [place] see patient

## 2013-07-05 ENCOUNTER — Ambulatory Visit (INDEPENDENT_AMBULATORY_CARE_PROVIDER_SITE_OTHER): Payer: Medicare HMO | Admitting: Pharmacist

## 2013-07-05 DIAGNOSIS — I4891 Unspecified atrial fibrillation: Secondary | ICD-10-CM

## 2013-07-05 LAB — POCT INR: INR: 1.9

## 2013-07-05 NOTE — Patient Instructions (Signed)
Anticoagulation Dose Instructions as of 07/05/2013     Glynis Smiles Tue Wed Thu Fri Sat   New Dose 5 mg 2.5 mg 5 mg 5 mg 5 mg 2.5 mg 5 mg    Description       Take extra 1/2 tablet today, then continue 1/2 tablet on mondays and fridays and 1 tablet all other days.      INR was 1.9 today

## 2013-07-10 ENCOUNTER — Other Ambulatory Visit: Payer: Self-pay | Admitting: Nurse Practitioner

## 2013-08-08 ENCOUNTER — Ambulatory Visit (INDEPENDENT_AMBULATORY_CARE_PROVIDER_SITE_OTHER): Payer: Medicare HMO | Admitting: Cardiology

## 2013-08-08 ENCOUNTER — Encounter: Payer: Self-pay | Admitting: Cardiology

## 2013-08-08 ENCOUNTER — Ambulatory Visit (INDEPENDENT_AMBULATORY_CARE_PROVIDER_SITE_OTHER): Payer: Medicare HMO | Admitting: Pharmacist

## 2013-08-08 VITALS — BP 135/75 | HR 65 | Ht 72.0 in | Wt 229.0 lb

## 2013-08-08 DIAGNOSIS — I4891 Unspecified atrial fibrillation: Secondary | ICD-10-CM

## 2013-08-08 NOTE — Patient Instructions (Signed)
Anticoagulation Dose Instructions as of 08/08/2013     Glynis Smiles Tue Wed Thu Fri Sat   New Dose 5 mg 2.5 mg 5 mg 5 mg 5 mg 5 mg 5 mg    Description       Take extra 1/2 tablet today, then continue 1/2 tablet on mondays and 1 tablet all other days.

## 2013-08-08 NOTE — Patient Instructions (Signed)
The current medical regimen is effective;  continue present plan and medications.  Follow up in 1 year with Dr Hochrein.  You will receive a letter in the mail 2 months before you are due.  Please call us when you receive this letter to schedule your follow up appointment.  
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
## 2013-08-10 ENCOUNTER — Emergency Department (HOSPITAL_COMMUNITY): Payer: Medicare HMO

## 2013-08-10 ENCOUNTER — Encounter (HOSPITAL_COMMUNITY): Payer: Self-pay | Admitting: Emergency Medicine

## 2013-08-10 ENCOUNTER — Emergency Department (HOSPITAL_COMMUNITY)
Admission: EM | Admit: 2013-08-10 | Discharge: 2013-08-11 | Disposition: A | Discharge status: Home / Self Care | Payer: Medicare HMO | Attending: Emergency Medicine | Admitting: Emergency Medicine

## 2013-08-10 DIAGNOSIS — S60229A Contusion of unspecified hand, initial encounter: Secondary | ICD-10-CM | POA: Insufficient documentation

## 2013-08-10 DIAGNOSIS — Z8781 Personal history of (healed) traumatic fracture: Secondary | ICD-10-CM | POA: Insufficient documentation

## 2013-08-10 DIAGNOSIS — S20212A Contusion of left front wall of thorax, initial encounter: Secondary | ICD-10-CM

## 2013-08-10 DIAGNOSIS — Y9389 Activity, other specified: Secondary | ICD-10-CM | POA: Insufficient documentation

## 2013-08-10 DIAGNOSIS — IMO0002 Reserved for concepts with insufficient information to code with codable children: Secondary | ICD-10-CM | POA: Insufficient documentation

## 2013-08-10 DIAGNOSIS — I10 Essential (primary) hypertension: Secondary | ICD-10-CM | POA: Insufficient documentation

## 2013-08-10 DIAGNOSIS — S0081XA Abrasion of other part of head, initial encounter: Secondary | ICD-10-CM

## 2013-08-10 DIAGNOSIS — S0003XA Contusion of scalp, initial encounter: Secondary | ICD-10-CM | POA: Insufficient documentation

## 2013-08-10 DIAGNOSIS — S20219A Contusion of unspecified front wall of thorax, initial encounter: Secondary | ICD-10-CM | POA: Insufficient documentation

## 2013-08-10 DIAGNOSIS — Z7901 Long term (current) use of anticoagulants: Secondary | ICD-10-CM | POA: Insufficient documentation

## 2013-08-10 DIAGNOSIS — W010XXA Fall on same level from slipping, tripping and stumbling without subsequent striking against object, initial encounter: Secondary | ICD-10-CM

## 2013-08-10 DIAGNOSIS — S6980XA Other specified injuries of unspecified wrist, hand and finger(s), initial encounter: Secondary | ICD-10-CM | POA: Insufficient documentation

## 2013-08-10 DIAGNOSIS — Z79899 Other long term (current) drug therapy: Secondary | ICD-10-CM | POA: Insufficient documentation

## 2013-08-10 DIAGNOSIS — Y9289 Other specified places as the place of occurrence of the external cause: Secondary | ICD-10-CM | POA: Insufficient documentation

## 2013-08-10 DIAGNOSIS — S6990XA Unspecified injury of unspecified wrist, hand and finger(s), initial encounter: Secondary | ICD-10-CM | POA: Insufficient documentation

## 2013-08-10 DIAGNOSIS — S60221A Contusion of right hand, initial encounter: Secondary | ICD-10-CM

## 2013-08-10 DIAGNOSIS — W1809XA Striking against other object with subsequent fall, initial encounter: Secondary | ICD-10-CM | POA: Insufficient documentation

## 2013-08-10 DIAGNOSIS — Z8719 Personal history of other diseases of the digestive system: Secondary | ICD-10-CM | POA: Insufficient documentation

## 2013-08-10 DIAGNOSIS — Z87448 Personal history of other diseases of urinary system: Secondary | ICD-10-CM | POA: Insufficient documentation

## 2013-08-10 DIAGNOSIS — Z86718 Personal history of other venous thrombosis and embolism: Secondary | ICD-10-CM | POA: Insufficient documentation

## 2013-08-10 NOTE — ED Notes (Addendum)
Pt fell while attending a Christmas walk in [place]. Pt was on his way back to the car and tripped and fell. Pt unsure as to why he fell. Pt has an abrasion to left side of face from his cheek to forehead. Pt denies LOC and is on blood thinners. Pt also c/o left sided rib pain and sore right thumb and left pinky finger.
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
## 2013-08-11 MED ORDER — OXYCODONE-ACETAMINOPHEN 5-325 MG PO TABS
1.0000 | ORAL_TABLET | ORAL | Status: DC | PRN
Start: 2013-08-11 — End: 2013-10-15

## 2013-08-13 MED FILL — Oxycodone w/ Acetaminophen Tab 5-325 MG: ORAL | Qty: 6 | Status: AC

## 2013-09-10 ENCOUNTER — Ambulatory Visit (INDEPENDENT_AMBULATORY_CARE_PROVIDER_SITE_OTHER): Payer: Medicare HMO | Admitting: Pharmacist

## 2013-09-10 DIAGNOSIS — I4891 Unspecified atrial fibrillation: Secondary | ICD-10-CM

## 2013-09-10 LAB — POCT INR: INR: 2.3

## 2013-09-10 NOTE — Patient Instructions (Signed)
Anticoagulation Dose Instructions as of 09/10/2013     Keith Doyle Tue Wed Thu Fri Sat   New Dose 5 mg 2.5 mg 5 mg 5 mg 5 mg 5 mg 5 mg    Description       Continue warfarin 1/2 tablet on mondays and 1 tablet all other days.       INR was 2.3 today

## 2013-10-15 ENCOUNTER — Ambulatory Visit (INDEPENDENT_AMBULATORY_CARE_PROVIDER_SITE_OTHER): Payer: Medicare HMO | Admitting: Pharmacist

## 2013-10-15 DIAGNOSIS — I4891 Unspecified atrial fibrillation: Secondary | ICD-10-CM

## 2013-10-15 LAB — POCT INR: INR: 2.7

## 2013-10-24 ENCOUNTER — Telehealth: Payer: Self-pay | Admitting: *Deleted

## 2013-10-24 ENCOUNTER — Other Ambulatory Visit: Payer: Self-pay | Admitting: *Deleted

## 2013-10-24 ENCOUNTER — Other Ambulatory Visit: Payer: Self-pay | Admitting: Nurse Practitioner

## 2013-10-24 MED ORDER — FUROSEMIDE 20 MG PO TABS: 20.0000 mg | ORAL_TABLET | Freq: Two times a day (BID) | ORAL | Status: DC

## 2013-10-24 NOTE — Telephone Encounter (Signed)
Rx sent in for lasix bid.

## 2013-10-24 NOTE — Telephone Encounter (Signed)
Rx sent in as patient stated that he is taking. Are you going to put in orders and set him up for a bmet? Thanks, MI

## 2013-10-24 NOTE — Telephone Encounter (Signed)
Will forward to MD to review 

## 2013-10-24 NOTE — Telephone Encounter (Signed)
Tell him to continue with whatever he is currently taking with his Lasix.  Please update the med list.  Check a BMET.

## 2013-10-24 NOTE — Telephone Encounter (Signed)
Pt is aware he needs lab work.  He will have blood work - RX mailed to his home address that includes Dx and fax #.

## 2013-10-24 NOTE — Telephone Encounter (Signed)
Patient stated that Dr Percival Spanish told him to take lasix bid, but I do not see any record of this. Please advise. Thanks, MI

## 2013-10-26 NOTE — Telephone Encounter (Signed)
Seen Keith Doyle only, hasnt seen a provider since 09/28/12

## 2013-10-26 NOTE — Telephone Encounter (Signed)
No more refills after this one without being seen

## 2013-10-31 ENCOUNTER — Other Ambulatory Visit: Payer: Self-pay | Admitting: Cardiology

## 2013-11-01 LAB — BASIC METABOLIC PANEL
BUN: 15 mg/dL (ref 6–23)
CO2: 28 meq/L (ref 19–32)
Calcium: 9.8 mg/dL (ref 8.4–10.5)
Chloride: 103 mEq/L (ref 96–112)
Creat: 1.23 mg/dL (ref 0.50–1.35)
GLUCOSE: 124 mg/dL — AB (ref 70–99)
Potassium: 3.9 mEq/L (ref 3.5–5.3)
Sodium: 138 mEq/L (ref 135–145)

## 2013-11-21 ENCOUNTER — Ambulatory Visit (INDEPENDENT_AMBULATORY_CARE_PROVIDER_SITE_OTHER): Payer: Medicare HMO | Admitting: Pharmacist

## 2013-11-21 DIAGNOSIS — I4891 Unspecified atrial fibrillation: Secondary | ICD-10-CM

## 2013-11-21 LAB — POCT INR: INR: 1.9

## 2013-11-21 NOTE — Addendum Note (Signed)
Addended by: Cherre Robins on: 11/21/2013 08:09 AM   Modules accepted: Level of Service

## 2013-11-21 NOTE — Patient Instructions (Signed)
Anticoagulation Dose Instructions as of 11/21/2013     Keith Doyle Tue Wed Thu Fri Sat   New Dose 5 mg 2.5 mg 5 mg 5 mg 5 mg 5 mg 5 mg    Description       Take 1 and 1/2 tablets today only (11/21/2013), then continue warfarin 1/2 tablet on mondays and 1 tablet all other days.      INR was 1.9 today

## 2013-12-26 ENCOUNTER — Ambulatory Visit (INDEPENDENT_AMBULATORY_CARE_PROVIDER_SITE_OTHER): Payer: Medicare HMO | Admitting: Pharmacist

## 2013-12-26 DIAGNOSIS — I4891 Unspecified atrial fibrillation: Secondary | ICD-10-CM

## 2013-12-26 LAB — POCT INR: INR: 2.2

## 2013-12-26 NOTE — Patient Instructions (Signed)
Anticoagulation Dose Instructions as of 12/26/2013     Keith Doyle Tue Wed Thu Fri Sat   New Dose 5 mg 2.5 mg 5 mg 5 mg 5 mg 5 mg 5 mg    Description       Continue warfarin 1/2 tablet on mondays and 1 tablet all other days.      INR was 2.2 today

## 2014-02-04 ENCOUNTER — Ambulatory Visit (INDEPENDENT_AMBULATORY_CARE_PROVIDER_SITE_OTHER): Payer: Medicare HMO | Admitting: Pharmacist

## 2014-02-04 DIAGNOSIS — I4891 Unspecified atrial fibrillation: Secondary | ICD-10-CM

## 2014-02-04 LAB — POCT INR: INR: 2.7

## 2014-02-04 NOTE — Patient Instructions (Signed)
Anticoagulation Dose Instructions as of 02/04/2014     Keith Doyle Tue Wed Thu Fri Sat   New Dose 5 mg 2.5 mg 5 mg 5 mg 5 mg 5 mg 5 mg    Description       Continue warfarin 1/2 tablet on mondays and 1 tablet all other days.      INR was 2.7 today

## 2014-03-20 ENCOUNTER — Ambulatory Visit (INDEPENDENT_AMBULATORY_CARE_PROVIDER_SITE_OTHER): Payer: Medicare HMO | Admitting: Pharmacist

## 2014-03-20 DIAGNOSIS — I4891 Unspecified atrial fibrillation: Secondary | ICD-10-CM

## 2014-03-20 LAB — POCT INR: INR: 2.1

## 2014-03-20 NOTE — Patient Instructions (Signed)
Anticoagulation Dose Instructions as of 03/20/2014     Keith Doyle Tue Wed Thu Fri Sat   New Dose 5 mg 2.5 mg 5 mg 5 mg 5 mg 5 mg 5 mg    Description       Continue warfarin 1/2 tablet on mondays and 1 tablet all other days.      INR was 2.1 today

## 2014-04-09 ENCOUNTER — Other Ambulatory Visit: Payer: Self-pay | Admitting: Nurse Practitioner

## 2014-04-11 ENCOUNTER — Other Ambulatory Visit: Payer: Self-pay | Admitting: Nurse Practitioner

## 2014-04-11 NOTE — Telephone Encounter (Signed)
no more refills without being seen  

## 2014-04-15 ENCOUNTER — Other Ambulatory Visit: Payer: Self-pay | Admitting: Nurse Practitioner

## 2014-04-16 NOTE — Telephone Encounter (Signed)
Patient last seen in office on 03-20-14 for a protime. Has been some time since had regular office visit. Please advise on refill

## 2014-04-16 NOTE — Telephone Encounter (Signed)
Patient NTBS for follow up and lab work  

## 2014-05-10 ENCOUNTER — Telehealth: Payer: Self-pay | Admitting: Pharmacist

## 2014-05-10 NOTE — Telephone Encounter (Signed)
I am not so sure that orthopedists intended patient to stop warfarin completely or just for a few days before and after knee injection.  Keith Doyle' PCP is actually Octavio Graves and I left a message with the receptionist Dawn to find out if patient is to continue to take warfarin or not.  I also advised patient that we will probably not be able to continue to do INR checks here if Dr Melina Copa is his PCP.  He is to consider either going to her office for INR checks or changing to PCP here in our office.

## 2014-07-09 ENCOUNTER — Ambulatory Visit (INDEPENDENT_AMBULATORY_CARE_PROVIDER_SITE_OTHER): Payer: Medicare HMO | Admitting: *Deleted

## 2014-07-09 ENCOUNTER — Ambulatory Visit: Payer: Medicare HMO

## 2014-07-09 DIAGNOSIS — Z23 Encounter for immunization: Secondary | ICD-10-CM

## 2014-07-23 ENCOUNTER — Telehealth: Payer: Self-pay | Admitting: Family Medicine

## 2014-07-24 ENCOUNTER — Other Ambulatory Visit: Payer: Medicare HMO

## 2014-07-24 ENCOUNTER — Ambulatory Visit (INDEPENDENT_AMBULATORY_CARE_PROVIDER_SITE_OTHER): Payer: Medicare HMO | Admitting: *Deleted

## 2014-07-24 DIAGNOSIS — Z23 Encounter for immunization: Secondary | ICD-10-CM

## 2014-07-24 NOTE — Progress Notes (Signed)
Patient ID: Keith Doyle, male   DOB: Jun 05, 1936, 78 y.o.   MRN: [id number] Pt tolerated inj well

## 2014-07-29 ENCOUNTER — Other Ambulatory Visit (INDEPENDENT_AMBULATORY_CARE_PROVIDER_SITE_OTHER): Payer: Medicare HMO

## 2014-07-29 DIAGNOSIS — E1310 Other specified diabetes mellitus with ketoacidosis without coma: Secondary | ICD-10-CM

## 2014-07-29 DIAGNOSIS — E785 Hyperlipidemia, unspecified: Secondary | ICD-10-CM

## 2014-07-29 DIAGNOSIS — I10 Essential (primary) hypertension: Secondary | ICD-10-CM

## 2014-07-29 DIAGNOSIS — E1110 Type 2 diabetes mellitus with ketoacidosis without coma: Secondary | ICD-10-CM

## 2014-07-29 LAB — POCT GLYCOSYLATED HEMOGLOBIN (HGB A1C): Hemoglobin A1C: 6.1

## 2014-07-29 NOTE — Progress Notes (Signed)
Lab only 

## 2014-07-29 NOTE — Telephone Encounter (Signed)
Pt given appt with dr Sabra Heck 1/15 @ 11 to re-establish care.

## 2014-07-30 ENCOUNTER — Telehealth: Payer: Self-pay

## 2014-07-30 LAB — NMR, LIPOPROFILE
CHOLESTEROL: 161 mg/dL (ref 100–199)
HDL CHOLESTEROL BY NMR: 42 mg/dL (ref >39)
HDL Particle Number: 33.8 umol/L (ref >=30.5)
LDL Particle Number: 1093 nmol/L — ABNORMAL HIGH (ref <1000)
LDL Size: 20.2 nm (ref >20.5)
LDL-C: 79 mg/dL (ref 0–99)
LP-IR Score: 82 — ABNORMAL HIGH (ref <=45)
Small LDL Particle Number: 704 nmol/L — ABNORMAL HIGH (ref <=527)
TRIGLYCERIDES BY NMR: 199 mg/dL — AB (ref 0–149)

## 2014-07-30 LAB — HEPATIC FUNCTION PANEL
ALT: 21 IU/L (ref 0–44)
AST: 13 IU/L (ref 0–40)
Albumin: 4.5 g/dL (ref 3.5–4.8)
Alkaline Phosphatase: 41 IU/L (ref 39–117)
Bilirubin, Direct: 0.08 mg/dL (ref 0.00–0.40)
Total Protein: 6.6 g/dL (ref 6.0–8.5)

## 2014-07-30 LAB — BMP8+EGFR
BUN/Creatinine Ratio: 13 (ref 10–22)
BUN: 12 mg/dL (ref 8–27)
CALCIUM: 9.8 mg/dL (ref 8.6–10.2)
CO2: 26 mmol/L (ref 18–29)
CREATININE: 0.9 mg/dL (ref 0.76–1.27)
Chloride: 107 mmol/L (ref 97–108)
GFR calc Af Amer: 96 mL/min/{1.73_m2} (ref >59)
GFR calc non Af Amer: 83 mL/min/{1.73_m2} (ref >59)
Glucose: 129 mg/dL — ABNORMAL HIGH (ref 65–99)
POTASSIUM: 4.4 mmol/L (ref 3.5–5.2)
SODIUM: 147 mmol/L — AB (ref 134–144)

## 2014-07-30 NOTE — Telephone Encounter (Signed)
Left results on home am as per DPR of 05/2013

## 2014-07-30 NOTE — Telephone Encounter (Signed)
-----   Message from [name], MD sent at 07/30/2014 12:18 PM EST ----- Hemoglobin A1c is at goal LDL cholesterol is good with slight abnormality in the LDL particle number; liver function tests are normal and the basic metabolic profile is acceptable

## 2014-08-14 ENCOUNTER — Encounter: Payer: Self-pay | Admitting: Cardiology

## 2014-08-14 ENCOUNTER — Ambulatory Visit (INDEPENDENT_AMBULATORY_CARE_PROVIDER_SITE_OTHER): Payer: Commercial Managed Care - HMO | Admitting: Cardiology

## 2014-08-14 VITALS — BP 134/80 | HR 52 | Ht 72.0 in | Wt 217.0 lb

## 2014-08-14 DIAGNOSIS — I480 Paroxysmal atrial fibrillation: Secondary | ICD-10-CM

## 2014-08-14 NOTE — Patient Instructions (Signed)
The current medical regimen is effective;  continue present plan and medications.  Follow up as needed 
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
## 2014-08-26 ENCOUNTER — Ambulatory Visit (INDEPENDENT_AMBULATORY_CARE_PROVIDER_SITE_OTHER): Payer: Medicare HMO | Admitting: Pharmacist

## 2014-08-26 DIAGNOSIS — I480 Paroxysmal atrial fibrillation: Secondary | ICD-10-CM

## 2014-09-20 ENCOUNTER — Telehealth: Payer: Self-pay | Admitting: *Deleted

## 2014-09-20 ENCOUNTER — Ambulatory Visit (INDEPENDENT_AMBULATORY_CARE_PROVIDER_SITE_OTHER): Payer: Commercial Managed Care - HMO | Admitting: Family Medicine

## 2014-09-20 ENCOUNTER — Encounter: Payer: Self-pay | Admitting: Family Medicine

## 2014-09-20 VITALS — BP 143/80 | HR 61 | Temp 97.1°F | Ht 72.0 in | Wt 213.0 lb

## 2014-09-20 DIAGNOSIS — E785 Hyperlipidemia, unspecified: Secondary | ICD-10-CM | POA: Diagnosis not present

## 2014-09-20 DIAGNOSIS — E118 Type 2 diabetes mellitus with unspecified complications: Secondary | ICD-10-CM | POA: Diagnosis not present

## 2014-09-20 DIAGNOSIS — R7989 Other specified abnormal findings of blood chemistry: Secondary | ICD-10-CM

## 2014-09-20 DIAGNOSIS — E876 Hypokalemia: Secondary | ICD-10-CM | POA: Diagnosis not present

## 2014-09-20 DIAGNOSIS — I10 Essential (primary) hypertension: Secondary | ICD-10-CM | POA: Diagnosis not present

## 2014-09-20 LAB — POCT GLYCOSYLATED HEMOGLOBIN (HGB A1C): Hemoglobin A1C: 6

## 2014-09-20 LAB — POCT UA - MICROALBUMIN: Microalbumin Ur, POC: NEGATIVE mg/L

## 2014-09-20 NOTE — Telephone Encounter (Signed)
Pt aware of lab results.  rs °

## 2014-09-20 NOTE — Progress Notes (Signed)
Subjective:    Patient ID: Keith Doyle, male    DOB: [birth year], 79 y.o.   MRN: [id number]  HPI 79 year old gentleman here to reestablish care. He has been seeing a family physician and made an as well as his cardiologist for atrial fibrillation. He does have a history of diabetes with peripheral neuropathy, hypertension, and hyperlipidemia. He is currently off his Coumadin. This was stopped when he had some what sounds like arthroscopic surgery on his knee   Patient Active Problem List   Diagnosis Date Noted  . Dizziness 06/20/2012  . Exertional dyspnea 06/20/2012  . Diabetes mellitus 06/20/2012  . Influenza A 08/28/2011  . Hypokalemia 08/28/2011  . ARF (acute renal failure) 08/28/2011  . Hiatal hernia 11/28/2010  . BPH (benign prostatic hyperplasia) 11/28/2010  . DVT of lower extremity (deep venous thrombosis) 11/28/2010  . Osteopenia 11/28/2010  . ED (erectile dysfunction) 11/28/2010  . Peripheral neuropathy 11/28/2010  . Stroke 11/28/2010  . Hyperlipidemia 11/28/2010  . OVERWEIGHT 01/02/2009  . CARDIOVASCULAR FUNCTION STUDY, ABNORMAL 01/02/2009  . OTH NONSPECIFIC ABNORM CV SYSTEM FUNCTION STUDY 01/02/2009  . Essential hypertension 01/01/2009   Outpatient Encounter Prescriptions as of 09/20/2014  Medication Sig  . amLODipine (NORVASC) 10 MG tablet TAKE 1 TABLET EVERY DAY  . atorvastatin (LIPITOR) 40 MG tablet Take 40 mg by mouth daily.  . benazepril (LOTENSIN) 40 MG tablet Take 1 tablet (40 mg total) by mouth 2 (two) times daily.  . Cinnamon 500 MG capsule Take 1,000 mg by mouth daily.  . ergocalciferol (VITAMIN D2) 50000 UNITS capsule Take 50,000 Units by mouth every Wednesday.   . fenofibrate 160 MG tablet Take 1 tablet (160 mg total) by mouth daily.  . fish oil-omega-3 fatty acids 1000 MG capsule Take 1 g by mouth daily.   . furosemide (LASIX) 20 MG tablet Take 1 tablet (20 mg total) by mouth 2 (two) times daily.  Marland Kitchen gabapentin (NEURONTIN) 400 MG capsule TAKE 2 CAPSULES  TWICE DAILY  . meclizine (ANTIVERT) 25 MG tablet Take 25 mg by mouth 3 (three) times daily.   . metFORMIN (GLUCOPHAGE) 500 MG tablet Take 1 tablet (500 mg total) by mouth 2 (two) times daily with a meal.  . potassium chloride SA (K-DUR,KLOR-CON) 20 MEQ tablet Take 1 tablet (20 mEq total) by mouth 2 (two) times daily.  . ferrous sulfate 325 (65 FE) MG tablet Take 325 mg by mouth daily with breakfast.   . [DISCONTINUED] acetaminophen (TYLENOL) 650 MG CR tablet Take 650 mg by mouth daily as needed for pain.     Review of Systems  Constitutional: Negative.   HENT: Positive for hearing loss.   Eyes: Negative.   Respiratory: Negative.   Cardiovascular: Positive for leg swelling.  Gastrointestinal: Negative.   Genitourinary: Negative.   Neurological: Negative.   Psychiatric/Behavioral: Negative.        Objective:   Physical Exam  Constitutional: He is oriented to person, place, and time. He appears well-developed and well-nourished.  HENT:  Head: Normocephalic.  Neck: Normal range of motion.  Cardiovascular: Normal rate and regular rhythm.   Appears to be in normal sinus rhythm today  Pulmonary/Chest: Effort normal.  Abdominal: Soft. Bowel sounds are normal.  Musculoskeletal: Normal range of motion.  Neurological: He is alert and oriented to person, place, and time.  Psychiatric: He has a normal mood and affect. His behavior is normal. Thought content normal.    BP 143/80 mmHg  Pulse 61  Temp(Src) 97.1 F (36.2 C) (Oral)  Ht 6' (1.829 m)  Wt 213 lb (96.616 kg)  BMI 28.88 kg/m2       Assessment & Plan:  1. Hypokalemia   2. Essential hypertension   3. Type 2 diabetes mellitus with complication  - POCT glycosylated hemoglobin (Hb A1C) - POCT UA - Microalbumin - CMP14+EGFR  4. Low serum vitamin D  - Vit D  25 hydroxy (rtn osteoporosis monitoring)  5. Hyperlipidemia  - Lipid   [name] MD

## 2014-09-20 NOTE — Telephone Encounter (Signed)
lmtcb regarding test results. 

## 2014-09-20 NOTE — Telephone Encounter (Signed)
-----   Message from [name], MD sent at 09/20/2014 12:40 PM EST ----- Hemoglobin A1c and microalbumin are both acceptable values

## 2014-09-21 LAB — CMP14+EGFR
A/G RATIO: 2 (ref 1.1–2.5)
ALK PHOS: 40 IU/L (ref 39–117)
ALT: 15 IU/L (ref 0–44)
AST: 14 IU/L (ref 0–40)
Albumin: 4.5 g/dL (ref 3.5–4.8)
BUN/Creatinine Ratio: 13 (ref 10–22)
BUN: 11 mg/dL (ref 8–27)
CO2: 26 mmol/L (ref 18–29)
CREATININE: 0.86 mg/dL (ref 0.76–1.27)
Calcium: 10.2 mg/dL (ref 8.6–10.2)
Chloride: 102 mmol/L (ref 97–108)
GFR calc Af Amer: 97 mL/min/{1.73_m2} (ref >59)
GFR calc non Af Amer: 84 mL/min/{1.73_m2} (ref >59)
Globulin, Total: 2.3 g/dL (ref 1.5–4.5)
Glucose: 97 mg/dL (ref 65–99)
POTASSIUM: 4.3 mmol/L (ref 3.5–5.2)
SODIUM: 142 mmol/L (ref 134–144)
TOTAL PROTEIN: 6.8 g/dL (ref 6.0–8.5)
Total Bilirubin: <0.2 mg/dL (ref 0.0–1.2)

## 2014-09-21 LAB — LIPID PANEL
CHOL/HDL RATIO: 3.7 ratio (ref 0.0–5.0)
Cholesterol, Total: 129 mg/dL (ref 100–199)
HDL: 35 mg/dL — AB (ref >39)
LDL CALC: 46 mg/dL (ref 0–99)
Triglycerides: 238 mg/dL — ABNORMAL HIGH (ref 0–149)
VLDL Cholesterol Cal: 48 mg/dL — ABNORMAL HIGH (ref 5–40)

## 2014-09-21 LAB — VITAMIN D 25 HYDROXY (VIT D DEFICIENCY, FRACTURES): Vit D, 25-Hydroxy: 44.1 ng/mL (ref 30.0–100.0)

## 2014-09-23 ENCOUNTER — Telehealth: Payer: Self-pay | Admitting: Family Medicine

## 2014-09-23 NOTE — Telephone Encounter (Signed)
-----   Message from [name], MD sent at 09/23/2014  9:25 AM EST ----- Lipids are good; chemistries are normal as is Vit D; continue to take 09-1998 units D daily

## 2014-09-24 ENCOUNTER — Encounter: Payer: Self-pay | Admitting: *Deleted

## 2014-09-26 NOTE — Telephone Encounter (Signed)
Patient aware.

## 2014-10-03 ENCOUNTER — Ambulatory Visit (INDEPENDENT_AMBULATORY_CARE_PROVIDER_SITE_OTHER): Payer: Commercial Managed Care - HMO | Admitting: Pharmacist

## 2014-10-03 ENCOUNTER — Encounter: Payer: Self-pay | Admitting: Pharmacist

## 2014-10-03 VITALS — BP 130/70 | HR 64 | Ht 72.0 in | Wt 216.0 lb

## 2014-10-03 DIAGNOSIS — E1140 Type 2 diabetes mellitus with diabetic neuropathy, unspecified: Secondary | ICD-10-CM

## 2014-10-03 DIAGNOSIS — I480 Paroxysmal atrial fibrillation: Secondary | ICD-10-CM | POA: Insufficient documentation

## 2014-10-03 DIAGNOSIS — Z0000 Encounter for general adult medical examination without abnormal findings: Secondary | ICD-10-CM | POA: Diagnosis not present

## 2014-10-03 DIAGNOSIS — E1142 Type 2 diabetes mellitus with diabetic polyneuropathy: Secondary | ICD-10-CM | POA: Diagnosis not present

## 2014-10-03 DIAGNOSIS — M8580 Other specified disorders of bone density and structure, unspecified site: Secondary | ICD-10-CM | POA: Insufficient documentation

## 2014-10-03 LAB — POCT INR: INR: 3

## 2014-10-03 LAB — HM DIABETES EYE EXAM

## 2014-10-03 NOTE — Progress Notes (Signed)
Subjective:    Keith Doyle is a 79 y.o. male who presents for Medicare Initial Wellness Visit and recheck protime   Preventive Screening-Counseling & Management  Tobacco History  Smoking status  . Never Smoker   Smokeless tobacco  . Never Used    Comment: does not smoke   Current Problems (verified) Patient Active Problem List   Diagnosis Date Noted  . Paroxysmal atrial fibrillation 10/03/2014  . Dizziness 06/20/2012  . Exertional dyspnea 06/20/2012  . Diabetes mellitus 06/20/2012  . Influenza A 08/28/2011  . Hypokalemia 08/28/2011  . ARF (acute renal failure) 08/28/2011  . Hiatal hernia 11/28/2010  . BPH (benign prostatic hyperplasia) 11/28/2010  . DVT of lower extremity (deep venous thrombosis) 11/28/2010  . Osteopenia 11/28/2010  . ED (erectile dysfunction) 11/28/2010  . Peripheral neuropathy 11/28/2010  . Stroke 11/28/2010  . Hyperlipidemia 11/28/2010  . OVERWEIGHT 01/02/2009  . CARDIOVASCULAR FUNCTION STUDY, ABNORMAL 01/02/2009  . OTH NONSPECIFIC ABNORM CV SYSTEM FUNCTION STUDY 01/02/2009  . Essential hypertension 01/01/2009   Medications Prior to Visit Current Outpatient Prescriptions on File Prior to Visit  Medication Sig Dispense Refill  . amLODipine (NORVASC) 10 MG tablet TAKE 1 TABLET EVERY DAY 90 tablet 1  . atorvastatin (LIPITOR) 40 MG tablet Take 40 mg by mouth daily.    . benazepril (LOTENSIN) 40 MG tablet Take 1 tablet (40 mg total) by mouth 2 (two) times daily. 90 tablet 0  . Cinnamon 500 MG capsule Take 1,000 mg by mouth daily.    . fenofibrate 160 MG tablet Take 1 tablet (160 mg total) by mouth daily. 90 tablet 0  . fish oil-omega-3 fatty acids 1000 MG capsule Take 1 g by mouth daily.     . furosemide (LASIX) 20 MG tablet Take 1 tablet (20 mg total) by mouth 2 (two) times daily. (Patient taking differently: Take 20 mg by mouth daily. ) 180 tablet 2  . gabapentin (NEURONTIN) 400 MG capsule TAKE 2 CAPSULES TWICE DAILY 240 capsule 1  . meclizine  (ANTIVERT) 25 MG tablet Take 25 mg by mouth 3 (three) times daily.     . metFORMIN (GLUCOPHAGE) 500 MG tablet Take 1 tablet (500 mg total) by mouth 2 (two) times daily with a meal. 180 tablet 0  . potassium chloride SA (K-DUR,KLOR-CON) 20 MEQ tablet Take 1 tablet (20 mEq total) by mouth 2 (two) times daily. 180 tablet 0  . warfarin (COUMADIN) 5 MG tablet Take 5 mg by mouth daily. Or as directed by coumadin clinic    . ferrous sulfate 325 (65 FE) MG tablet Take 325 mg by mouth daily with breakfast.      No current facility-administered medications on file prior to visit.    Current Medications (verified) Current Outpatient Prescriptions  Medication Sig Dispense Refill  . amLODipine (NORVASC) 10 MG tablet TAKE 1 TABLET EVERY DAY 90 tablet 1  . atorvastatin (LIPITOR) 40 MG tablet Take 40 mg by mouth daily.    . benazepril (LOTENSIN) 40 MG tablet Take 1 tablet (40 mg total) by mouth 2 (two) times daily. 90 tablet 0  . Cholecalciferol (VITAMIN D3) 5000 UNITS CAPS Take 1 capsule by mouth daily.    . Cinnamon 500 MG capsule Take 1,000 mg by mouth daily.    . fenofibrate 160 MG tablet Take 1 tablet (160 mg total) by mouth daily. 90 tablet 0  . fish oil-omega-3 fatty acids 1000 MG capsule Take 1 g by mouth daily.     . furosemide (LASIX) 20  MG tablet Take 1 tablet (20 mg total) by mouth 2 (two) times daily. (Patient taking differently: Take 20 mg by mouth daily. ) 180 tablet 2  . gabapentin (NEURONTIN) 400 MG capsule TAKE 2 CAPSULES TWICE DAILY 240 capsule 1  . meclizine (ANTIVERT) 25 MG tablet Take 25 mg by mouth 3 (three) times daily.     . metFORMIN (GLUCOPHAGE) 500 MG tablet Take 1 tablet (500 mg total) by mouth 2 (two) times daily with a meal. 180 tablet 0  . potassium chloride SA (K-DUR,KLOR-CON) 20 MEQ tablet Take 1 tablet (20 mEq total) by mouth 2 (two) times daily. 180 tablet 0  . warfarin (COUMADIN) 5 MG tablet Take 5 mg by mouth daily. Or as directed by coumadin clinic    . ferrous sulfate  325 (65 FE) MG tablet Take 325 mg by mouth daily with breakfast.      No current facility-administered medications for this visit.     Allergies (verified) Review of patient's allergies indicates no known allergies.   PAST HISTORY  Family History Family History  Problem Relation Age of Onset  . Heart attack Neg Hx     also of CVA abd blood clots   . Cardiomyopathy Son     had ICD placed 05/2011 at [facility]  . Parkinson's disease Father   . Emphysema Father   . Diabetes Sister   . Parkinson's disease Brother   . Cancer Brother     prostate    Social History History  Substance Use Topics  . Smoking status: Never Smoker   . Smokeless tobacco: Never Used     Comment: does not smoke  . Alcohol Use: No   Are there smokers in your home (other than you)?  No  Risk Factors Current exercise habits: Continues to dance regularly (3 nights per week).  [facility] [employer] [facility]. Dietary issues discussed: limiting sugars, salt and CHO.  Cardiac risk factors: advanced age (older than 53 for men, 61 for women), diabetes mellitus, dyslipidemia, hypertension and male gender.  Depression Screen (Note: if answer to either of the following is "Yes", a more complete depression screening is indicated)   Q1: Over the past two weeks, have you felt down, depressed or hopeless? No  Q2: Over the past two weeks, have you felt little interest or pleasure in doing things? No  Have you lost interest or pleasure in daily life? No  Do you often feel hopeless? No  Do you cry easily over simple problems? No  Activities of Daily Living In your present state of health, do you have any difficulty performing the following activities?:  Driving? No Managing money?  No Feeding yourself? No Getting from bed to chair? No Climbing a flight of stairs? Yes - gets SOB at times but denies SOB when he dances with other physical activity Preparing food and eating?: No Bathing or showering?  No Getting dressed: No Getting to the toilet? No Using the toilet:No Moving around from place to place: No In the past year have you fallen or had a near fall?:Yes   Are you sexually active?  No  Do you have more than one partner?  No  Hearing Difficulties: Yes - has had hearing tested about 1-2 years ago and has hearing aids Do you often ask people to speak up or repeat themselves? No Do you experience ringing or noises in your ears? No Do you have difficulty understanding soft or whispered voices? Yes  Do you feel that you have a problem with memory? No  Do you often misplace items? No  Do you feel safe at home?  Yes  Cognitive Testing  Alert? Yes  Normal Appearance?Yes  Oriented to person? Yes    Place? Yes   Time? Yes  Recall of three objects?  Yes  Can perform simple calculations? Yes  Displays appropriate judgment?Yes  Can read the correct time from a watch face?Yes   Advanced Directives have been discussed with the patient? Yes   List the Names of Other Physician/Practitioners you currently use: 1.  Dr Marin Comment - optometrist 2.  Dr Jenkins [place] - Cardiologist 3.  Dr Freda Munro - urologist 4.  Dr Allyne Gee.  Indicate any recent Medical Services you may have received from other than [facility] providers in the past year (date may be approximate).  Immunization History  Administered Date(s) Administered  . Influenza Whole 06/02/2010  . Influenza,inj,Quad PF,36+ Mos 07/09/2014  . Pneumococcal Conjugate-13 07/24/2014  . Pneumococcal Polysaccharide-23 06/07/2007    Screening Tests Health Maintenance  Topic Date Due  . FOOT EXAM  03/17/1946  . OPHTHALMOLOGY EXAM  [birth year]17/1946  . ZOSTAVAX  10/21/2014 (Originally 03/17/1998)  . TETANUS/TDAP  10/21/2014 (Originally 03/17/1957)  . HEMOGLOBIN A1C  03/21/2015  . INFLUENZA VACCINE  04/07/2015  . COLONOSCOPY  05/22/2015  . URINE MICROALBUMIN  09/21/2015  . PNEUMOCOCCAL POLYSACCHARIDE VACCINE AGE 61 AND OVER  Completed    All answers were  reviewed with the patient and necessary referrals were made:  Cherre Robins, [facility]   10/03/2014   History reviewed: allergies, current medications, past family history, past medical history, past social history, past surgical history and problem list  Objective:    Blood pressure 130/70, pulse 64, height 6' (1.829 m), weight 216 lb (97.977 kg). Body mass index is 29.29 kg/(m^2).   INR was 3.0 today   Assessment:     Initial Annual Wellness Visit Therapeutic Anitcoagulation Osteopenia     Plan:     During the course of the visit the patient was educated and counseled about appropriate screening and preventive services including:    Pneumococcal vaccine - UTD  Influenza vaccine - UTD  Td vaccine - UTD  Shingles vaccine - checked cost - patient decided to post pone due to cost  Prostate cancer screening - UTD  Colorectal cancer screening - Due later this year   Glaucoma screening and diabetic eye exam - patient missed appt 09/26/2014 due to weather. Called Dr le's office while patient was here are they told him to come on over and they will see him today.   DEXA - last DEXA showed osteopenia - due recheck - referral sent in today  Nutrition counseling - discussed limiting high salt and CHO foods.  Specifically discussed switching from canned vegetable to frozen.   Advanced directives: no AD - given information packet  Food exam performed today and educated about proper foot care for diabetic patients  Continue current physcial activity level - dancing and balance classes - great job!  Diet review for nutrition referral? Patient declined  Patient Instructions (the written plan) was given to the patient.  Medicare Attestation I have personally reviewed: The patient's medical and social history Their use of alcohol, tobacco or illicit drugs Their current medications and supplements The patient's functional ability including ADLs,fall risks, home safety risks,  cognitive, and hearing and visual impairment Diet and physical activities Evidence for depression or mood disorders  The patient's weight, height, BMI, and BP?HR  have been recorded in the chart.  I have made referrals, counseling, and provided education to the patient based on review of the above and I have provided the patient with a written personalized care plan for preventive services.     Cherre Robins, [facility]   10/03/2014

## 2014-10-03 NOTE — Patient Instructions (Addendum)
Anticoagulation Dose Instructions as of 10/03/2014      Dorene Grebe Tue Wed Thu Fri Sat   New Dose 5 mg 2.5 mg 5 mg 5 mg 5 mg 2.5 mg 5 mg    Description        Continue current warfarin dose of 1/2 tablet on mondays and fridays and 1 tablet all other days.      INR was 3.0 today    Health Maintenance Summary     FOOT EXAM Done  Today     Diabetic Eye Exam Overdue 03/17/1946 appt made today    ZOSTAVAX Postponed 10/21/2014 Cost is $34.65    TETANUS/TDAP Done 03/02/2011  Originally 03/17/1957. Insurance / Financial   Bone Density Due now  Referral sent    HEMOGLOBIN A1C Next Due 03/21/2015     INFLUENZA VACCINE Next Due 04/07/2015     COLONOSCOPY Next Due 05/22/2015     URINE MICROALBUMIN Done 09/2014         Fall Prevention and Home Safety Falls cause injuries and can affect all age groups. It is possible to use preventive measures to significantly decrease the likelihood of falls. There are many simple measures which can make your home safer and prevent falls. OUTDOORS  Repair cracks and edges of walkways and driveways.  Remove high doorway thresholds.  Trim shrubbery on the main path into your home.  Have good outside lighting.  Clear walkways of tools, rocks, debris, and clutter.  Check that handrails are not broken and are securely fastened. Both sides of steps should have handrails.  Have leaves, snow, and ice cleared regularly.  Use sand or salt on walkways during winter months.  In the garage, clean up grease or oil spills. BATHROOM  Install night lights.  Install grab bars by the toilet and in the tub and shower.  Use non-skid mats or decals in the tub or shower.  Place a plastic non-slip stool in the shower to sit on, if needed.  Keep floors dry and clean up all water on the floor immediately.  Remove soap buildup in the tub or shower on a regular basis.  Secure bath mats with non-slip, double-sided rug tape.  Remove throw rugs and tripping hazards from  the floors. BEDROOMS  Install night lights.  Make sure a bedside light is easy to reach.  Do not use oversized bedding.  Keep a telephone by your bedside.  Have a firm chair with side arms to use for getting dressed.  Remove throw rugs and tripping hazards from the floor. KITCHEN  Keep handles on pots and pans turned toward the center of the stove. Use back burners when possible.  Clean up spills quickly and allow time for drying.  Avoid walking on wet floors.  Avoid hot utensils and knives.  Position shelves so they are not too high or low.  Place commonly used objects within easy reach.  If necessary, use a sturdy step stool with a grab bar when reaching.  Keep electrical cables out of the way.  Do not use floor polish or wax that makes floors slippery. If you must use wax, use non-skid floor wax.  Remove throw rugs and tripping hazards from the floor. STAIRWAYS  Never leave objects on stairs.  Place handrails on both sides of stairways and use them. Fix any loose handrails. Make sure handrails on both sides of the stairways are as long as the stairs.  Check carpeting to make sure it is firmly attached along  stairs. Make repairs to worn or loose carpet promptly.  Avoid placing throw rugs at the top or bottom of stairways, or properly secure the rug with carpet tape to prevent slippage. Get rid of throw rugs, if possible.  Have an electrician put in a light switch at the top and bottom of the stairs. OTHER FALL PREVENTION TIPS  Wear low-heel or rubber-soled shoes that are supportive and fit well. Wear closed toe shoes.  When using a stepladder, make sure it is fully opened and both spreaders are firmly locked. Do not climb a closed stepladder.  Add color or contrast paint or tape to grab bars and handrails in your home. Place contrasting color strips on first and last steps.  Learn and use mobility aids as needed. Install an electrical emergency response  system.  Turn on lights to avoid dark areas. Replace light bulbs that burn out immediately. Get light switches that glow.  Arrange furniture to create clear pathways. Keep furniture in the same place.  Firmly attach carpet with non-skid or double-sided tape.  Eliminate uneven floor surfaces.  Select a carpet pattern that does not visually hide the edge of steps.  Be aware of all pets. OTHER HOME SAFETY TIPS  Set the water temperature for 120 F (48.8 C).  Keep emergency numbers on or near the telephone.  Keep smoke detectors on every level of the home and near sleeping areas. Document Released: 08/13/2002 Document Revised: 02/22/2012 Document Reviewed: 11/12/2011 [facility] Patient Information 2015 [place], [place]. This information is not intended to replace advice given to you by your health care provider. Make sure you discuss any questions you have with your health care provider.

## 2014-10-04 LAB — MICROALBUMIN / CREATININE URINE RATIO
Creatinine, Ur: 17 mg/dL — ABNORMAL LOW (ref 22.0–328.0)
MICROALB/CREAT RATIO: <17.6 mg/g creat (ref 0.0–30.0)

## 2014-10-14 ENCOUNTER — Encounter: Payer: Self-pay | Admitting: *Deleted

## 2014-10-14 ENCOUNTER — Other Ambulatory Visit: Payer: Self-pay | Admitting: Nurse Practitioner

## 2014-11-06 DIAGNOSIS — M170 Bilateral primary osteoarthritis of knee: Secondary | ICD-10-CM | POA: Diagnosis not present

## 2014-11-07 ENCOUNTER — Ambulatory Visit (INDEPENDENT_AMBULATORY_CARE_PROVIDER_SITE_OTHER): Payer: Commercial Managed Care - HMO | Admitting: Pharmacist

## 2014-11-07 DIAGNOSIS — I480 Paroxysmal atrial fibrillation: Secondary | ICD-10-CM | POA: Diagnosis not present

## 2014-11-07 LAB — POCT INR: INR: 2.9

## 2014-11-07 MED ORDER — GLUCOSE BLOOD VI STRP: ORAL_STRIP | Status: DC

## 2014-11-07 NOTE — Patient Instructions (Signed)
.   Anticoagulation Dose Instructions as of 11/07/2014      Keith Doyle Tue Wed Thu Fri Sat   New Dose 5 mg 2.5 mg 5 mg 5 mg 5 mg 2.5 mg 5 mg    Description        Continue current warfarin dose of 1/2 tablet on mondays and fridays and 1 tablet all other days.      INR was 2.9 today

## 2014-11-20 DIAGNOSIS — M79602 Pain in left arm: Secondary | ICD-10-CM | POA: Diagnosis not present

## 2014-11-20 DIAGNOSIS — S51819A Laceration without foreign body of unspecified forearm, initial encounter: Secondary | ICD-10-CM | POA: Diagnosis not present

## 2014-11-20 DIAGNOSIS — W19XXXA Unspecified fall, initial encounter: Secondary | ICD-10-CM | POA: Diagnosis not present

## 2014-11-20 DIAGNOSIS — S5292XA Unspecified fracture of left forearm, initial encounter for closed fracture: Secondary | ICD-10-CM | POA: Diagnosis not present

## 2014-11-20 DIAGNOSIS — S52592A Other fractures of lower end of left radius, initial encounter for closed fracture: Secondary | ICD-10-CM | POA: Diagnosis not present

## 2014-11-21 DIAGNOSIS — M79632 Pain in left forearm: Secondary | ICD-10-CM | POA: Diagnosis not present

## 2014-11-21 DIAGNOSIS — M25522 Pain in left elbow: Secondary | ICD-10-CM | POA: Diagnosis not present

## 2014-11-21 DIAGNOSIS — S52382A Bent bone of left radius, initial encounter for closed fracture: Secondary | ICD-10-CM | POA: Diagnosis not present

## 2014-11-28 DIAGNOSIS — S52312D Greenstick fracture of shaft of radius, left arm, subsequent encounter for fracture with routine healing: Secondary | ICD-10-CM | POA: Diagnosis not present

## 2014-11-28 DIAGNOSIS — M79632 Pain in left forearm: Secondary | ICD-10-CM | POA: Diagnosis not present

## 2014-11-28 DIAGNOSIS — M170 Bilateral primary osteoarthritis of knee: Secondary | ICD-10-CM | POA: Diagnosis not present

## 2014-12-05 ENCOUNTER — Inpatient Hospital Stay (HOSPITAL_COMMUNITY): Payer: Commercial Managed Care - HMO

## 2014-12-05 ENCOUNTER — Encounter (HOSPITAL_COMMUNITY): Payer: Self-pay | Admitting: Emergency Medicine

## 2014-12-05 ENCOUNTER — Other Ambulatory Visit: Payer: Self-pay | Admitting: Family

## 2014-12-05 ENCOUNTER — Encounter: Payer: Self-pay | Admitting: Family

## 2014-12-05 ENCOUNTER — Ambulatory Visit (INDEPENDENT_AMBULATORY_CARE_PROVIDER_SITE_OTHER): Payer: Commercial Managed Care - HMO | Admitting: Family

## 2014-12-05 ENCOUNTER — Inpatient Hospital Stay (HOSPITAL_COMMUNITY)
Admission: EM | Admit: 2014-12-05 | Discharge: 2014-12-09 | DRG: 378 | Disposition: A | Discharge status: Home / Self Care | Payer: Commercial Managed Care - HMO | Attending: Internal Medicine | Admitting: Internal Medicine

## 2014-12-05 VITALS — BP 133/67 | HR 64 | Temp 98.9°F | Ht 72.0 in | Wt 227.8 lb

## 2014-12-05 DIAGNOSIS — R32 Unspecified urinary incontinence: Secondary | ICD-10-CM

## 2014-12-05 DIAGNOSIS — Z833 Family history of diabetes mellitus: Secondary | ICD-10-CM

## 2014-12-05 DIAGNOSIS — K449 Diaphragmatic hernia without obstruction or gangrene: Secondary | ICD-10-CM | POA: Diagnosis present

## 2014-12-05 DIAGNOSIS — D649 Anemia, unspecified: Secondary | ICD-10-CM | POA: Diagnosis not present

## 2014-12-05 DIAGNOSIS — N400 Enlarged prostate without lower urinary tract symptoms: Secondary | ICD-10-CM | POA: Diagnosis not present

## 2014-12-05 DIAGNOSIS — Z8042 Family history of malignant neoplasm of prostate: Secondary | ICD-10-CM

## 2014-12-05 DIAGNOSIS — K2950 Unspecified chronic gastritis without bleeding: Secondary | ICD-10-CM | POA: Diagnosis not present

## 2014-12-05 DIAGNOSIS — K644 Residual hemorrhoidal skin tags: Secondary | ICD-10-CM | POA: Diagnosis present

## 2014-12-05 DIAGNOSIS — D125 Benign neoplasm of sigmoid colon: Secondary | ICD-10-CM | POA: Diagnosis not present

## 2014-12-05 DIAGNOSIS — H8110 Benign paroxysmal vertigo, unspecified ear: Secondary | ICD-10-CM

## 2014-12-05 DIAGNOSIS — E1340 Other specified diabetes mellitus with diabetic neuropathy, unspecified: Secondary | ICD-10-CM | POA: Diagnosis not present

## 2014-12-05 DIAGNOSIS — K259 Gastric ulcer, unspecified as acute or chronic, without hemorrhage or perforation: Secondary | ICD-10-CM | POA: Diagnosis not present

## 2014-12-05 DIAGNOSIS — D500 Iron deficiency anemia secondary to blood loss (chronic): Secondary | ICD-10-CM | POA: Diagnosis present

## 2014-12-05 DIAGNOSIS — Z7901 Long term (current) use of anticoagulants: Secondary | ICD-10-CM

## 2014-12-05 DIAGNOSIS — Z86718 Personal history of other venous thrombosis and embolism: Secondary | ICD-10-CM | POA: Diagnosis not present

## 2014-12-05 DIAGNOSIS — M545 Low back pain, unspecified: Secondary | ICD-10-CM | POA: Diagnosis present

## 2014-12-05 DIAGNOSIS — I5032 Chronic diastolic (congestive) heart failure: Secondary | ICD-10-CM | POA: Diagnosis present

## 2014-12-05 DIAGNOSIS — D62 Acute posthemorrhagic anemia: Secondary | ICD-10-CM | POA: Insufficient documentation

## 2014-12-05 DIAGNOSIS — R001 Bradycardia, unspecified: Secondary | ICD-10-CM | POA: Diagnosis not present

## 2014-12-05 DIAGNOSIS — I10 Essential (primary) hypertension: Secondary | ICD-10-CM | POA: Diagnosis present

## 2014-12-05 DIAGNOSIS — K648 Other hemorrhoids: Secondary | ICD-10-CM | POA: Diagnosis present

## 2014-12-05 DIAGNOSIS — M549 Dorsalgia, unspecified: Secondary | ICD-10-CM | POA: Diagnosis not present

## 2014-12-05 DIAGNOSIS — E785 Hyperlipidemia, unspecified: Secondary | ICD-10-CM

## 2014-12-05 DIAGNOSIS — K649 Unspecified hemorrhoids: Secondary | ICD-10-CM | POA: Diagnosis present

## 2014-12-05 DIAGNOSIS — G629 Polyneuropathy, unspecified: Secondary | ICD-10-CM

## 2014-12-05 DIAGNOSIS — K635 Polyp of colon: Secondary | ICD-10-CM | POA: Diagnosis present

## 2014-12-05 DIAGNOSIS — G8929 Other chronic pain: Secondary | ICD-10-CM | POA: Diagnosis present

## 2014-12-05 DIAGNOSIS — S3992XA Unspecified injury of lower back, initial encounter: Secondary | ICD-10-CM | POA: Diagnosis not present

## 2014-12-05 DIAGNOSIS — I480 Paroxysmal atrial fibrillation: Secondary | ICD-10-CM | POA: Diagnosis not present

## 2014-12-05 DIAGNOSIS — T45515A Adverse effect of anticoagulants, initial encounter: Secondary | ICD-10-CM | POA: Diagnosis present

## 2014-12-05 DIAGNOSIS — Z8673 Personal history of transient ischemic attack (TIA), and cerebral infarction without residual deficits: Secondary | ICD-10-CM | POA: Diagnosis not present

## 2014-12-05 DIAGNOSIS — E861 Hypovolemia: Secondary | ICD-10-CM | POA: Diagnosis present

## 2014-12-05 DIAGNOSIS — E870 Hyperosmolality and hypernatremia: Secondary | ICD-10-CM | POA: Diagnosis not present

## 2014-12-05 DIAGNOSIS — E1140 Type 2 diabetes mellitus with diabetic neuropathy, unspecified: Secondary | ICD-10-CM | POA: Diagnosis present

## 2014-12-05 DIAGNOSIS — Z825 Family history of asthma and other chronic lower respiratory diseases: Secondary | ICD-10-CM | POA: Diagnosis not present

## 2014-12-05 DIAGNOSIS — N179 Acute kidney failure, unspecified: Secondary | ICD-10-CM | POA: Diagnosis present

## 2014-12-05 DIAGNOSIS — Z820 Family history of epilepsy and other diseases of the nervous system: Secondary | ICD-10-CM | POA: Diagnosis not present

## 2014-12-05 DIAGNOSIS — K254 Chronic or unspecified gastric ulcer with hemorrhage: Secondary | ICD-10-CM | POA: Diagnosis not present

## 2014-12-05 DIAGNOSIS — D6832 Hemorrhagic disorder due to extrinsic circulating anticoagulants: Secondary | ICD-10-CM | POA: Diagnosis present

## 2014-12-05 DIAGNOSIS — Z823 Family history of stroke: Secondary | ICD-10-CM | POA: Diagnosis not present

## 2014-12-05 DIAGNOSIS — M25562 Pain in left knee: Secondary | ICD-10-CM | POA: Diagnosis present

## 2014-12-05 HISTORY — DX: Unspecified hemorrhoids: K64.9

## 2014-12-05 HISTORY — DX: Polyp of colon: K63.5

## 2014-12-05 HISTORY — DX: Adverse effect of anticoagulants, initial encounter: T45.515A

## 2014-12-05 HISTORY — DX: Transient cerebral ischemic attack, unspecified: G45.9

## 2014-12-05 HISTORY — DX: Unspecified atrial fibrillation: I48.91

## 2014-12-05 HISTORY — DX: Iron deficiency anemia secondary to blood loss (chronic): D50.0

## 2014-12-05 HISTORY — DX: Hemorrhagic disorder due to extrinsic circulating anticoagulants: D68.32

## 2014-12-05 HISTORY — DX: Gastric ulcer, unspecified as acute or chronic, without hemorrhage or perforation: K25.9

## 2014-12-05 HISTORY — DX: Type 2 diabetes mellitus with diabetic neuropathy, unspecified: E11.40

## 2014-12-05 LAB — COMPREHENSIVE METABOLIC PANEL
ALK PHOS: 33 U/L — AB (ref 39–117)
ALT: 21 U/L (ref 0–53)
AST: 22 U/L (ref 0–37)
Albumin: 3.4 g/dL — ABNORMAL LOW (ref 3.5–5.2)
Anion gap: 8 (ref 5–15)
BILIRUBIN TOTAL: 0.5 mg/dL (ref 0.3–1.2)
BUN: 18 mg/dL (ref 6–23)
CHLORIDE: 109 mmol/L (ref 96–112)
CO2: 22 mmol/L (ref 19–32)
Calcium: 8.8 mg/dL (ref 8.4–10.5)
Creatinine, Ser: 1.25 mg/dL (ref 0.50–1.35)
GFR, EST AFRICAN AMERICAN: 63 mL/min — AB (ref >90)
GFR, EST NON AFRICAN AMERICAN: 54 mL/min — AB (ref >90)
GLUCOSE: 166 mg/dL — AB (ref 70–99)
Potassium: 3.6 mmol/L (ref 3.5–5.1)
SODIUM: 139 mmol/L (ref 135–145)
Total Protein: 5.7 g/dL — ABNORMAL LOW (ref 6.0–8.3)

## 2014-12-05 LAB — POCT CBC
GRANULOCYTE PERCENT: 63.7 % (ref 37–80)
HEMATOCRIT: 21.8 % — AB (ref 43.5–53.7)
HEMOGLOBIN: 6.6 g/dL — AB (ref 14.1–18.1)
LYMPH, POC: 1.8 (ref 0.6–3.4)
MCH, POC: 24.9 pg — AB (ref 27–31.2)
MCHC: 30.3 g/dL — AB (ref 31.8–35.4)
MCV: 82 fL (ref 80–97)
MPV: 6.9 fL (ref 0–99.8)
POC GRANULOCYTE: 3.4 (ref 2–6.9)
POC LYMPH %: 32.9 % (ref 10–50)
Platelet Count, POC: 269 10*3/uL (ref 142–424)
RBC: 2.66 M/uL — AB (ref 4.69–6.13)
RDW, POC: 16.9 %
WBC: 5.4 10*3/uL (ref 4.6–10.2)

## 2014-12-05 LAB — CBC WITH DIFFERENTIAL/PLATELET
Basophils Absolute: 0 10*3/uL (ref 0.0–0.1)
Basophils Relative: 1 % (ref 0–1)
EOS ABS: 0.2 10*3/uL (ref 0.0–0.7)
EOS PCT: 4 % (ref 0–5)
HCT: 21.3 % — ABNORMAL LOW (ref 39.0–52.0)
HEMOGLOBIN: 6.3 g/dL — AB (ref 13.0–17.0)
LYMPHS ABS: 1.4 10*3/uL (ref 0.7–4.0)
Lymphocytes Relative: 31 % (ref 12–46)
MCH: 25.6 pg — AB (ref 26.0–34.0)
MCHC: 29.6 g/dL — ABNORMAL LOW (ref 30.0–36.0)
MCV: 86.6 fL (ref 78.0–100.0)
MONO ABS: 0.3 10*3/uL (ref 0.1–1.0)
MONOS PCT: 6 % (ref 3–12)
Neutro Abs: 2.7 10*3/uL (ref 1.7–7.7)
Neutrophils Relative %: 58 % (ref 43–77)
Platelets: 220 10*3/uL (ref 150–400)
RBC: 2.46 MIL/uL — AB (ref 4.22–5.81)
RDW: 16.3 % — ABNORMAL HIGH (ref 11.5–15.5)
WBC: 4.6 10*3/uL (ref 4.0–10.5)

## 2014-12-05 LAB — RETICULOCYTES
RBC.: 2.46 MIL/uL — ABNORMAL LOW (ref 4.22–5.81)
RBC.: 2.46 MIL/uL — ABNORMAL LOW (ref 4.22–5.81)
RETIC COUNT ABSOLUTE: 93.5 10*3/uL (ref 19.0–186.0)
Retic Count, Absolute: 93.5 10*3/uL (ref 19.0–186.0)
Retic Ct Pct: 3.8 % — ABNORMAL HIGH (ref 0.4–3.1)
Retic Ct Pct: 3.8 % — ABNORMAL HIGH (ref 0.4–3.1)

## 2014-12-05 LAB — ABO/RH: ABO/RH(D): A POS

## 2014-12-05 LAB — GLUCOSE, CAPILLARY: Glucose-Capillary: 119 mg/dL — ABNORMAL HIGH (ref 70–99)

## 2014-12-05 LAB — PROTIME-INR
INR: 3.14 — AB (ref 0.00–1.49)
Prothrombin Time: 32.5 seconds — ABNORMAL HIGH (ref 11.6–15.2)

## 2014-12-05 LAB — PREPARE RBC (CROSSMATCH)

## 2014-12-05 LAB — POC OCCULT BLOOD, ED: Fecal Occult Bld: POSITIVE — AB

## 2014-12-05 MED ORDER — METHOCARBAMOL 500 MG PO TABS
500.0000 mg | ORAL_TABLET | Freq: Three times a day (TID) | ORAL | Status: DC | PRN
  Administered 2014-12-05: 500 mg via ORAL
  Filled 2014-12-05: qty 1

## 2014-12-05 MED ORDER — PANTOPRAZOLE SODIUM 40 MG IV SOLR
40.0000 mg | Freq: Two times a day (BID) | INTRAVENOUS | Status: DC
Start: 2014-12-09 — End: 2014-12-06

## 2014-12-05 MED ORDER — PHYTONADIONE 5 MG PO TABS
ORAL_TABLET | ORAL | Status: AC
  Filled 2014-12-05: qty 1

## 2014-12-05 MED ORDER — FUROSEMIDE 20 MG PO TABS: 20.0000 mg | ORAL_TABLET | Freq: Every day | ORAL | Status: DC

## 2014-12-05 MED ORDER — MECLIZINE HCL 50 MG PO TABS: 50.0000 mg | ORAL_TABLET | Freq: Two times a day (BID) | ORAL | Status: DC | PRN

## 2014-12-05 MED ORDER — PHYTONADIONE 5 MG PO TABS
2.5000 mg | ORAL_TABLET | Freq: Once | ORAL | Status: AC
  Administered 2014-12-05: 2.5 mg via ORAL
  Filled 2014-12-05: qty 1

## 2014-12-05 MED ORDER — SODIUM CHLORIDE 0.9 % IV SOLN
80.0000 mg | Freq: Once | INTRAVENOUS | Status: AC
  Administered 2014-12-05: 80 mg via INTRAVENOUS
  Filled 2014-12-05: qty 80

## 2014-12-05 MED ORDER — MORPHINE SULFATE 2 MG/ML IJ SOLN
2.0000 mg | INTRAMUSCULAR | Status: DC | PRN
  Filled 2014-12-05: qty 1

## 2014-12-05 MED ORDER — AMLODIPINE BESYLATE 5 MG PO TABS
10.0000 mg | ORAL_TABLET | Freq: Every day | ORAL | Status: DC
  Administered 2014-12-06: 10 mg via ORAL
  Filled 2014-12-05 (×2): qty 2

## 2014-12-05 MED ORDER — INSULIN ASPART 100 UNIT/ML ~~LOC~~ SOLN
0.0000 [IU] | Freq: Three times a day (TID) | SUBCUTANEOUS | Status: DC
  Administered 2014-12-08 – 2014-12-09 (×4): 1 [IU] via SUBCUTANEOUS

## 2014-12-05 MED ORDER — BENAZEPRIL HCL 10 MG PO TABS
40.0000 mg | ORAL_TABLET | Freq: Two times a day (BID) | ORAL | Status: DC
  Administered 2014-12-05 – 2014-12-09 (×8): 40 mg via ORAL
  Filled 2014-12-05 (×8): qty 4

## 2014-12-05 MED ORDER — SODIUM CHLORIDE 0.9 % IJ SOLN
3.0000 mL | Freq: Two times a day (BID) | INTRAMUSCULAR | Status: DC
  Administered 2014-12-05 – 2014-12-08 (×5): 3 mL via INTRAVENOUS

## 2014-12-05 MED ORDER — PANTOPRAZOLE SODIUM 40 MG IV SOLR
INTRAVENOUS | Status: AC
  Filled 2014-12-05: qty 80

## 2014-12-05 MED ORDER — SODIUM CHLORIDE 0.9 % IV SOLN: Freq: Once | INTRAVENOUS | Status: DC

## 2014-12-05 MED ORDER — GABAPENTIN 400 MG PO CAPS
800.0000 mg | ORAL_CAPSULE | Freq: Two times a day (BID) | ORAL | Status: DC
  Administered 2014-12-05 – 2014-12-09 (×8): 800 mg via ORAL
  Filled 2014-12-05 (×9): qty 2

## 2014-12-05 MED ORDER — PHYTONADIONE 1 MG/0.5 ML ORAL SOLUTION
2.5000 mg | Freq: Once | ORAL | Status: DC
  Filled 2014-12-05: qty 1.5

## 2014-12-05 MED ORDER — ATORVASTATIN CALCIUM 40 MG PO TABS
40.0000 mg | ORAL_TABLET | Freq: Every day | ORAL | Status: DC
  Administered 2014-12-05 – 2014-12-09 (×5): 40 mg via ORAL
  Filled 2014-12-05 (×5): qty 1

## 2014-12-05 MED ORDER — SODIUM CHLORIDE 0.9 % IV BOLUS (SEPSIS)
500.0000 mL | Freq: Once | INTRAVENOUS | Status: AC
  Administered 2014-12-05: 500 mL via INTRAVENOUS

## 2014-12-05 MED ORDER — ONDANSETRON HCL 4 MG PO TABS: 4.0000 mg | ORAL_TABLET | Freq: Four times a day (QID) | ORAL | Status: DC | PRN

## 2014-12-05 MED ORDER — ONDANSETRON HCL 4 MG/2ML IJ SOLN: 4.0000 mg | Freq: Four times a day (QID) | INTRAMUSCULAR | Status: DC | PRN

## 2014-12-05 MED ORDER — FUROSEMIDE 20 MG PO TABS
20.0000 mg | ORAL_TABLET | Freq: Every day | ORAL | Status: DC
  Administered 2014-12-06 – 2014-12-09 (×4): 20 mg via ORAL
  Filled 2014-12-05 (×4): qty 1

## 2014-12-05 MED ORDER — POTASSIUM CHLORIDE CRYS ER 20 MEQ PO TBCR
20.0000 meq | EXTENDED_RELEASE_TABLET | Freq: Two times a day (BID) | ORAL | Status: DC
  Administered 2014-12-05 – 2014-12-08 (×6): 20 meq via ORAL
  Filled 2014-12-05 (×6): qty 1

## 2014-12-05 MED ORDER — SODIUM CHLORIDE 0.9 % IV SOLN
8.0000 mg/h | INTRAVENOUS | Status: DC
  Administered 2014-12-06: 8 mg/h via INTRAVENOUS
  Filled 2014-12-05 (×5): qty 80

## 2014-12-05 MED ORDER — SODIUM CHLORIDE 0.9 % IV SOLN
Freq: Once | INTRAVENOUS | Status: AC
  Administered 2014-12-06: via INTRAVENOUS

## 2014-12-05 MED ORDER — FENOFIBRATE 160 MG PO TABS
160.0000 mg | ORAL_TABLET | Freq: Every day | ORAL | Status: DC
  Administered 2014-12-06 – 2014-12-09 (×4): 160 mg via ORAL
  Filled 2014-12-05 (×4): qty 1

## 2014-12-05 NOTE — ED Notes (Signed)
Pt sent to ED from PCP office. Pt's Hgb 6. Has been c/o weakness and falls. Pt report back pain.
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
## 2014-12-05 NOTE — Progress Notes (Signed)
Patient is requesting to eat and drink until midnight. Paging on-call admitting MD, will follow orders given.

## 2014-12-05 NOTE — Patient Instructions (Addendum)
Benign Positional Vertigo Vertigo means you feel like you or your surroundings are moving when they are not. Benign positional vertigo is the most common form of vertigo. Benign means that the cause of your condition is not serious. Benign positional vertigo is more common in older adults. CAUSES  Benign positional vertigo is the result of an upset in the labyrinth system. This is an area in the middle ear that helps control your balance. This may be caused by a viral infection, head injury, or repetitive motion. However, often no specific cause is found. SYMPTOMS  Symptoms of benign positional vertigo occur when you move your head or eyes in different directions. Some of the symptoms may include:  Loss of balance and falls.  Vomiting.  Blurred vision.  Dizziness.  Nausea.  Involuntary eye movements (nystagmus). DIAGNOSIS  Benign positional vertigo is usually diagnosed by physical exam. If the specific cause of your benign positional vertigo is unknown, your caregiver may perform imaging tests, such as magnetic resonance imaging (MRI) or computed tomography (CT). TREATMENT  Your caregiver may recommend movements or procedures to correct the benign positional vertigo. Medicines such as meclizine, benzodiazepines, and medicines for nausea may be used to treat your symptoms. In rare cases, if your symptoms are caused by certain conditions that affect the inner ear, you may need surgery. HOME CARE INSTRUCTIONS   Follow your caregiver's instructions.  Move slowly. Do not make sudden body or head movements.  Avoid driving.  Avoid operating heavy machinery.  Avoid performing any tasks that would be dangerous to you or others during a vertigo episode.  Drink enough fluids to keep your urine clear or pale yellow. SEEK IMMEDIATE MEDICAL CARE IF:   You develop problems with walking, weakness, numbness, or using your arms, hands, or legs.  You have difficulty speaking.  You develop  severe headaches.  Your nausea or vomiting continues or gets worse.  You develop visual changes.  Your family or friends notice any behavioral changes.  Your condition gets worse.  You have a fever.  You develop a stiff neck or sensitivity to light. MAKE SURE YOU:   Understand these instructions.  Will watch your condition.  Will get help right away if you are not doing well or get worse. Document Released: 05/31/2006 Document Revised: 11/15/2011 Document Reviewed: 05/13/2011 [facility] Patient Information 2015 [place], [place]. This information is not intended to replace advice given to you by your health care provider. Make sure you discuss any questions you have with your health care provider. Fall Prevention and Home Safety Falls cause injuries and can affect all age groups. It is possible to prevent falls.  HOW TO PREVENT FALLS  Wear shoes with rubber soles that do not have an opening for your toes.  Keep the inside and outside of your house well lit.  Use night lights throughout your home.  Remove clutter from floors.  Clean up floor spills.  Remove throw rugs or fasten them to the floor with carpet tape.  Do not place electrical cords across pathways.  Put grab bars by your tub, shower, and toilet. Do not use towel bars as grab bars.  Put handrails on both sides of the stairway. Fix loose handrails.  Do not climb on stools or stepladders, if possible.  Do not wax your floors.  Repair uneven or unsafe sidewalks, walkways, or stairs.  Keep items you use a lot within reach.  Be aware of pets.  Keep emergency numbers next to the telephone.  Put smoke detectors in your home and near bedrooms. Ask your doctor what other things you can do to prevent falls. Document Released: 06/19/2009 Document Revised: 02/22/2012 Document Reviewed: 11/23/2011 [facility] Patient Information 2015 [place], [place]. This information is not intended to replace advice given to you by  your health care provider. Make sure you discuss any questions you have with your health care provider.

## 2014-12-05 NOTE — Progress Notes (Addendum)
   Subjective:    Patient ID: Keith Doyle, male    DOB: 1936-04-17, 79 y.o.   MRN: [id number]  Dizziness This is a recurrent problem. The current episode started more than 1 year ago. The problem occurs intermittently. The problem has been unchanged. Associated symptoms include vertigo and a visual change. Pertinent negatives include no anorexia, congestion, coughing, headaches, neck pain or weakness. The symptoms are aggravated by bending and twisting. He has tried rest (antivert) for the symptoms. The treatment provided mild relief.    *Pt fell outside of his care three weeks and broke his left wrist. Pt states he did not fall because he was dizzy, but because he tripped. Pt states this dizziness has been going on for months and seems to come and go and is worse when he moves his head fast or changes positions.   Review of Systems  Constitutional: Negative.   HENT: Negative.  Negative for congestion.   Respiratory: Negative.  Negative for cough.   Cardiovascular: Negative.   Gastrointestinal: Negative.  Negative for anorexia.  Endocrine: Negative.   Genitourinary: Negative.   Musculoskeletal: Negative.  Negative for neck pain.  Neurological: Positive for dizziness and vertigo. Negative for weakness and headaches.  Hematological: Negative.   Psychiatric/Behavioral: Negative.   All other systems reviewed and are negative.      Objective:   Physical Exam  Constitutional: He is oriented to person, place, and time. He appears well-developed and well-nourished. No distress.  HENT:  Head: Normocephalic.  Right Ear: External ear normal.  Left Ear: External ear normal.  Nose: Nose normal.  Mouth/Throat: Oropharynx is clear and moist.  Eyes: Pupils are equal, round, and reactive to light. Right eye exhibits no discharge. Left eye exhibits no discharge.  Neck: Normal range of motion. Neck supple. No thyromegaly present.  Cardiovascular: Normal rate, regular rhythm, normal heart sounds and  intact distal pulses.   No murmur heard. Pulmonary/Chest: Effort normal and breath sounds normal. No respiratory distress. He has no wheezes.  Abdominal: Soft. Bowel sounds are normal. He exhibits no distension. There is no tenderness.  Musculoskeletal: He exhibits no tenderness.  Left arm in cast  Neurological: He is alert and oriented to person, place, and time. He has normal reflexes. No cranial nerve deficit.  Skin: Skin is warm and dry. No rash noted. No erythema. There is pallor.  Psychiatric: He has a normal mood and affect. His behavior is normal. Judgment and thought content normal.  Vitals reviewed.     BP 133/67 mmHg  Pulse 64  Temp(Src) 98.9 F (37.2 C) (Oral)  Ht 6' (1.829 m)  Wt 227 lb 12.8 oz (103.329 kg)  BMI 30.89 kg/m2     Assessment & Plan:  1. BPPV (benign paroxysmal positional vertigo), unspecified laterality Exercises discussed Falls precaution discussed - meclizine (ANTIVERT) 50 MG tablet; Take 1 tablet (50 mg total) by mouth 2 (two) times daily as needed.  Dispense: 60 tablet; Refill: 11 - POCT CBC - CMP14+EGFR  2. Urinary incontinence, unspecified incontinence type - Ambulatory referral to Urology - POCT CBC - CMP14+EGFR  Pt's Hgb 6.6! Pt told he needs to go straight to ED. Pt denies any blood in stool or urine. Pt states he does not want EMS called and states he is calling his son.   Evelina Dun, FNP

## 2014-12-05 NOTE — ED Notes (Signed)
CRITICAL VALUE ALERT  Critical value received:  Hemoglobin  Date of notification:  12/05/14  Time of notification:  1805   Critical value read back:Yes.    Nurse who received alert:  Catha Brow RN  MD notified (1st page):  Dr. Dewayne Hatch  Time of first page:  1805  MD notified (2nd page):  Time of second page:  Responding MD:  Dr. Dewayne Hatch  Time MD responded:  1806

## 2014-12-05 NOTE — Addendum Note (Signed)
Addended by: Pollyann Kennedy F on: 12/05/2014 03:44 PM   Modules accepted: Orders
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
## 2014-12-06 ENCOUNTER — Encounter (HOSPITAL_COMMUNITY)
Admission: EM | Disposition: A | Discharge status: Home / Self Care | Payer: Self-pay | Source: Home / Self Care | Attending: Internal Medicine

## 2014-12-06 ENCOUNTER — Encounter (HOSPITAL_COMMUNITY): Payer: Self-pay | Admitting: Internal Medicine

## 2014-12-06 DIAGNOSIS — D649 Anemia, unspecified: Secondary | ICD-10-CM

## 2014-12-06 DIAGNOSIS — K635 Polyp of colon: Secondary | ICD-10-CM

## 2014-12-06 DIAGNOSIS — E1340 Other specified diabetes mellitus with diabetic neuropathy, unspecified: Secondary | ICD-10-CM

## 2014-12-06 DIAGNOSIS — T45515A Adverse effect of anticoagulants, initial encounter: Secondary | ICD-10-CM

## 2014-12-06 DIAGNOSIS — R001 Bradycardia, unspecified: Secondary | ICD-10-CM | POA: Diagnosis present

## 2014-12-06 DIAGNOSIS — D6832 Hemorrhagic disorder due to extrinsic circulating anticoagulants: Secondary | ICD-10-CM | POA: Diagnosis present

## 2014-12-06 DIAGNOSIS — D500 Iron deficiency anemia secondary to blood loss (chronic): Secondary | ICD-10-CM

## 2014-12-06 HISTORY — PX: ESOPHAGOGASTRODUODENOSCOPY: SHX5428

## 2014-12-06 HISTORY — DX: Hemorrhagic disorder due to extrinsic circulating anticoagulants: D68.32

## 2014-12-06 HISTORY — PX: COLONOSCOPY: SHX5424

## 2014-12-06 HISTORY — DX: Adverse effect of anticoagulants, initial encounter: T45.515A

## 2014-12-06 LAB — CBC
HCT: 25.4 % — ABNORMAL LOW (ref 39.0–52.0)
HCT: 27 % — ABNORMAL LOW (ref 39.0–52.0)
HEMOGLOBIN: 8 g/dL — AB (ref 13.0–17.0)
Hemoglobin: 8.3 g/dL — ABNORMAL LOW (ref 13.0–17.0)
MCH: 27.3 pg (ref 26.0–34.0)
MCH: 26.3 pg (ref 26.0–34.0)
MCHC: 31.5 g/dL (ref 30.0–36.0)
MCHC: 30.7 g/dL (ref 30.0–36.0)
MCV: 86.7 fL (ref 78.0–100.0)
MCV: 85.7 fL (ref 78.0–100.0)
Platelets: 215 10*3/uL (ref 150–400)
Platelets: 208 10*3/uL (ref 150–400)
RBC: 2.93 MIL/uL — ABNORMAL LOW (ref 4.22–5.81)
RBC: 3.15 MIL/uL — ABNORMAL LOW (ref 4.22–5.81)
RDW: 16 % — ABNORMAL HIGH (ref 11.5–15.5)
RDW: 15.9 % — ABNORMAL HIGH (ref 11.5–15.5)
WBC: 3.7 10*3/uL — ABNORMAL LOW (ref 4.0–10.5)
WBC: 4.9 10*3/uL (ref 4.0–10.5)

## 2014-12-06 LAB — CMP14+EGFR
A/G RATIO: 2.2 (ref 1.1–2.5)
ALT: 18 IU/L (ref 0–44)
AST: 16 IU/L (ref 0–40)
Albumin: 3.8 g/dL (ref 3.5–4.8)
Alkaline Phosphatase: 35 IU/L — ABNORMAL LOW (ref 39–117)
BUN / CREAT RATIO: 13 (ref 10–22)
BUN: 16 mg/dL (ref 8–27)
CO2: 24 mmol/L (ref 18–29)
Calcium: 9.2 mg/dL (ref 8.6–10.2)
Chloride: 107 mmol/L (ref 97–108)
Creatinine, Ser: 1.19 mg/dL (ref 0.76–1.27)
GFR calc Af Amer: 68 mL/min/{1.73_m2} (ref >59)
GFR calc non Af Amer: 59 mL/min/{1.73_m2} — ABNORMAL LOW (ref >59)
GLOBULIN, TOTAL: 1.7 g/dL (ref 1.5–4.5)
Glucose: 84 mg/dL (ref 65–99)
POTASSIUM: 4.4 mmol/L (ref 3.5–5.2)
Sodium: 143 mmol/L (ref 134–144)
Total Protein: 5.5 g/dL — ABNORMAL LOW (ref 6.0–8.5)

## 2014-12-06 LAB — APTT: aPTT: 36 seconds (ref 24–37)

## 2014-12-06 LAB — BASIC METABOLIC PANEL
Anion gap: 8 (ref 5–15)
BUN: 13 mg/dL (ref 6–23)
CHLORIDE: 114 mmol/L — AB (ref 96–112)
CO2: 22 mmol/L (ref 19–32)
CREATININE: 1.06 mg/dL (ref 0.50–1.35)
Calcium: 8.6 mg/dL (ref 8.4–10.5)
GFR calc Af Amer: 77 mL/min — ABNORMAL LOW (ref >90)
GFR calc non Af Amer: 66 mL/min — ABNORMAL LOW (ref >90)
Glucose, Bld: 98 mg/dL (ref 70–99)
POTASSIUM: 3.6 mmol/L (ref 3.5–5.1)
Sodium: 144 mmol/L (ref 135–145)

## 2014-12-06 LAB — CBC WITH DIFFERENTIAL/PLATELET
BASOS: 1 %
Basophils Absolute: 0 10*3/uL (ref 0.0–0.2)
EOS ABS: 0.2 10*3/uL (ref 0.0–0.4)
EOS: 4 %
HCT: 21.3 % — ABNORMAL LOW (ref 37.5–51.0)
Hemoglobin: 6.6 g/dL — CL (ref 12.6–17.7)
IMMATURE GRANULOCYTES: 0 %
Immature Grans (Abs): 0 10*3/uL (ref 0.0–0.1)
LYMPHS ABS: 1.6 10*3/uL (ref 0.7–3.1)
LYMPHS: 33 %
MCH: 25.4 pg — ABNORMAL LOW (ref 26.6–33.0)
MCHC: 31 g/dL — AB (ref 31.5–35.7)
MCV: 82 fL (ref 79–97)
Monocytes Absolute: 0.3 10*3/uL (ref 0.1–0.9)
Monocytes: 7 %
Neutrophils Absolute: 2.6 10*3/uL (ref 1.4–7.0)
Neutrophils Relative %: 55 %
Platelets: 245 10*3/uL (ref 150–379)
RBC: 2.6 x10E6/uL — AB (ref 4.14–5.80)
RDW: 16.2 % — ABNORMAL HIGH (ref 12.3–15.4)
WBC: 4.8 10*3/uL (ref 3.4–10.8)

## 2014-12-06 LAB — PROTIME-INR
INR: 2.53 — ABNORMAL HIGH (ref 0.00–1.49)
INR: 1.85 — ABNORMAL HIGH (ref 0.00–1.49)
Prothrombin Time: 27.5 seconds — ABNORMAL HIGH (ref 11.6–15.2)
Prothrombin Time: 21.5 seconds — ABNORMAL HIGH (ref 11.6–15.2)

## 2014-12-06 LAB — IRON AND TIBC
Iron: 12 ug/dL — ABNORMAL LOW (ref 42–165)
Iron: 29 ug/dL — ABNORMAL LOW (ref 42–165)
SATURATION RATIOS: 6 % — AB (ref 20–55)
Saturation Ratios: 2 % — ABNORMAL LOW (ref 20–55)
TIBC: 501 ug/dL — ABNORMAL HIGH (ref 215–435)
TIBC: 477 ug/dL — ABNORMAL HIGH (ref 215–435)
UIBC: 489 ug/dL — ABNORMAL HIGH (ref 125–400)
UIBC: 448 ug/dL — ABNORMAL HIGH (ref 125–400)

## 2014-12-06 LAB — GLUCOSE, CAPILLARY
Glucose-Capillary: 91 mg/dL (ref 70–99)
Glucose-Capillary: 95 mg/dL (ref 70–99)
Glucose-Capillary: 94 mg/dL (ref 70–99)
Glucose-Capillary: 94 mg/dL (ref 70–99)

## 2014-12-06 LAB — FOLATE

## 2014-12-06 LAB — VITAMIN B12: Vitamin B-12: 265 pg/mL (ref 211–911)

## 2014-12-06 LAB — FERRITIN: Ferritin: 5 ng/mL — ABNORMAL LOW (ref 22–322)

## 2014-12-06 SURGERY — COLONOSCOPY
Anesthesia: Moderate Sedation

## 2014-12-06 MED ORDER — MIDAZOLAM HCL 5 MG/5ML IJ SOLN
INTRAMUSCULAR | Status: AC
  Filled 2014-12-06: qty 10

## 2014-12-06 MED ORDER — ATROPINE SULFATE 1 MG/ML IJ SOLN: 0.5000 mg | Freq: Once | INTRAMUSCULAR | Status: AC | PRN

## 2014-12-06 MED ORDER — STERILE WATER FOR IRRIGATION IR SOLN
Status: DC | PRN
  Administered 2014-12-06: 15:00:00

## 2014-12-06 MED ORDER — ATROPINE SULFATE 1 MG/ML IJ SOLN
0.5000 mg | Freq: Once | INTRAMUSCULAR | Status: AC
  Administered 2014-12-06: .5 mg via INTRAVENOUS

## 2014-12-06 MED ORDER — MIDAZOLAM HCL 5 MG/5ML IJ SOLN
INTRAMUSCULAR | Status: DC | PRN
  Administered 2014-12-06 (×2): 1 mg via INTRAVENOUS
  Administered 2014-12-06: 2 mg via INTRAVENOUS
  Administered 2014-12-06: 1 mg via INTRAVENOUS
  Administered 2014-12-06: 2 mg via INTRAVENOUS
  Administered 2014-12-06: 1 mg via INTRAVENOUS

## 2014-12-06 MED ORDER — POTASSIUM CHLORIDE IN NACL 20-0.9 MEQ/L-% IV SOLN
INTRAVENOUS | Status: DC
  Administered 2014-12-06 – 2014-12-07 (×2): via INTRAVENOUS

## 2014-12-06 MED ORDER — MEPERIDINE HCL 100 MG/ML IJ SOLN
INTRAMUSCULAR | Status: AC
  Filled 2014-12-06: qty 2

## 2014-12-06 MED ORDER — VITAMIN K1 10 MG/ML IJ SOLN: 10.0000 mg | INTRAMUSCULAR | Status: DC

## 2014-12-06 MED ORDER — SODIUM CHLORIDE 0.9 % IV SOLN
INTRAVENOUS | Status: DC
  Administered 2014-12-06: 15:00:00 via INTRAVENOUS

## 2014-12-06 MED ORDER — MEPERIDINE HCL 100 MG/ML IJ SOLN
INTRAMUSCULAR | Status: DC | PRN
Start: 2014-12-06 — End: 2014-12-06
  Administered 2014-12-06 (×2): 25 mg via INTRAVENOUS
  Administered 2014-12-06: 50 mg via INTRAVENOUS

## 2014-12-06 MED ORDER — SODIUM CHLORIDE 0.9 % IV SOLN
8.0000 mg/h | INTRAVENOUS | Status: DC
  Administered 2014-12-06: 8 mg/h via INTRAVENOUS
  Filled 2014-12-06 (×6): qty 80

## 2014-12-06 MED ORDER — LIDOCAINE VISCOUS 2 % MT SOLN
OROMUCOSAL | Status: AC
  Filled 2014-12-06: qty 15

## 2014-12-06 MED ORDER — SODIUM CHLORIDE 0.9 % IV SOLN
Freq: Once | INTRAVENOUS | Status: AC
  Administered 2014-12-06: 13:00:00 via INTRAVENOUS

## 2014-12-06 MED ORDER — PANTOPRAZOLE SODIUM 40 MG PO TBEC: 40.0000 mg | DELAYED_RELEASE_TABLET | Freq: Two times a day (BID) | ORAL | Status: DC

## 2014-12-06 MED ORDER — ATROPINE SULFATE 1 MG/ML IJ SOLN
INTRAMUSCULAR | Status: AC
  Filled 2014-12-06: qty 1

## 2014-12-06 MED ORDER — VITAMIN K1 10 MG/ML IJ SOLN
10.0000 mg | Freq: Once | INTRAVENOUS | Status: AC
  Administered 2014-12-06: 10 mg via INTRAVENOUS
  Filled 2014-12-06: qty 1

## 2014-12-06 MED ORDER — PEG 3350-KCL-NA BICARB-NACL 420 G PO SOLR
4000.0000 mL | Freq: Once | ORAL | Status: AC
  Administered 2014-12-06: 4000 mL via ORAL
  Filled 2014-12-06: qty 4000

## 2014-12-06 MED ORDER — PANTOPRAZOLE SODIUM 40 MG IV SOLR
8.0000 mg/h | INTRAVENOUS | Status: DC
  Filled 2014-12-06 (×6): qty 80

## 2014-12-06 MED ORDER — PANTOPRAZOLE SODIUM 40 MG PO TBEC: 40.0000 mg | DELAYED_RELEASE_TABLET | Freq: Every day | ORAL | Status: DC

## 2014-12-06 NOTE — OR Nursing (Signed)
Patient was getting FFP when brought down from floor. Completed at 1529. VSS. No reactions noted. Could not complete in flowsheet at 1529, due to nurse busy during Endoscopy procedure. Documented later as being completed, but time incorrect. Actual completed time 1529.
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
## 2014-12-06 NOTE — Progress Notes (Signed)
Pt HR is dropping in the 30's and then increase to the lower 50's. The Highest has been 54. I have paged the MD. Pt has no complaints at this time. I will continue to monitor

## 2014-12-06 NOTE — Progress Notes (Signed)
Patient's HR is in the 50's. Patient is asymptomatic. Paged on-call Md to make him aware. Will follow any new orders given and continue to monitor the patient.

## 2014-12-06 NOTE — Op Note (Addendum)
[facility] [address] [place], [address]   ENDOSCOPY PROCEDURE REPORT  PATIENT: Keith Doyle, Keith Doyle  MR#: [id number] BIRTHDATE: 09-27-1935 , 78  yrs. old GENDER: male  ENDOSCOPIST: Danie Binder, MD REFERRED DT:OIZTIW Laurance Flatten, M.D.  Minus Breeding, M.D. PROCEDURE DATE: 12/06/2014 PROCEDURE:   EGD w/ biopsy  INDICATIONS:iron deficiency anemia. (FEDA) WITH FERRITIN 5 ON COUMADIN FOR AFIB MEDICATIONS: TCS+ Versed 2 mg IV TOPICAL ANESTHETIC:   none ASA CLASS:  DESCRIPTION OF PROCEDURE:     Physical exam was performed.  Informed consent was obtained from the patient after explaining the benefits, risks, and alternatives to the procedure.  The patient was connected to the monitor and placed in the left lateral position.  Continuous oxygen was provided by nasal cannula and IV medicine administered through an indwelling cannula.  After administration of sedation, the patients esophagus was intubated and the EC-[id number] ([id number])  endoscope was advanced under direct visualization to the second portion of the duodenum.  The scope was removed slowly by carefully examining the color, texture, anatomy, and integrity of the mucosa on the way out.  The patient was recovered in endoscopy and discharged home in satisfactory condition.   ESOPHAGUS: The mucosa of the esophagus appeared normal.   STOMACH: A large SLIDING hiatal hernia was noted.   Multiple large non-bleeding cameron's erosion(s)/LINEAR ULCERS were found in the gastric body and gastric fundus.  Multiple biopsies was performed using cold forceps.   DUODENUM: The duodenal mucosa showed no abnormalities in the bulb and 2nd part of the duodenum. COMPLICATIONS: There were no immediate complications.  ENDOSCOPIC IMPRESSION: 1.   FEDA DUE TO CAMERONS'ULCERS/EROSIONS 2.   Large hiatal hernia  RECOMMENDATIONS: STOP PROTONIX GTT THEN BID PPI FOR 3 MOS THEN DAILY LOW FAT DIET AWAIT BIOPSY PT NOT A CANDIDATE FOR LONG  TERM ANTICOAGULATION UNLESS CBC FOLLOWED CLOSELY AND pRBCs GIVEN PRN. Discussed with [facility], son, and daughter. OPV W/ DR. Angeliah Wisdom IN 3 MOS  REPEAT EXAM:  eSigned:  Danie Binder, MD 12-26-2014 12:28 PM Revised: 12/26/2014 12:28 PM    CPT CODES: ICD CODES:  The ICD and CPT codes recommended by this software are interpretations from the data that the clinical staff has captured with the software.  The verification of the translation of this report to the ICD and CPT codes and modifiers is the sole responsibility of the health care institution and practicing physician where this report was generated.  [employer] will not be held responsible for the validity of the ICD and CPT codes included on this report.  AMA assumes no liability for data contained or not contained herein. CPT is a Designer, television/film set of the [employer].
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
## 2014-12-06 NOTE — Op Note (Addendum)
[facility] [address] [place], [address]   COLONOSCOPY PROCEDURE REPORT  PATIENT: Keith Doyle, Keith Doyle  MR#: [id number] BIRTHDATE: 05-Dec-1935 , 78  yrs. old GENDER: male ENDOSCOPIST: Danie Binder, MD REFERRED VZ:SMOLMB Laurance Flatten, M.D. PROCEDURE DATE:  2014-12-27 PROCEDURE:   Colonoscopy with snare polypectomy INDICATIONS:iron deficiency anemia(FEDA) ON COUMADIN FOR AFIB  PMHx: DVT/IVC FILTER PLACEMENT MEDICATIONS: Atropine 0 mg IV, Demerol 100 mg IV, and Versed 6 mg IV   DESCRIPTION OF PROCEDURE:    Physical exam was performed.  Informed consent was obtained from the patient after explaining the benefits, risks, and alternatives to procedure.  The patient was connected to monitor and placed in left lateral position. Continuous oxygen was provided by nasal cannula and IV medicine administered through an indwelling cannula.  After administration of sedation and rectal exam, the patients rectum was intubated and the EC-[id number] ([id number])  colonoscope was advanced under direct visualization to the ileum.  The scope was removed slowly by carefully examining the color, texture, anatomy, and integrity mucosa on the way out.  The patient was recovered in endoscopy and discharged home in satisfactory condition.    COLON FINDINGS: The examined terminal ileum appeared to be normal. , Two sessile polyps ranging from 6 to 40mm in size were found in the sigmoid colon.  A polypectomy was performed using snare cautery.  , The colon was redundant.  Manual abdominal counter-pressure was used to reach the cecum.  The patient was moved on to their back to reach the cecum, and Moderate sized external and SMALL internal hemorrhoids were found.  PREP QUALITY: good.  CECAL W/D TIME: 21       minutes COMPLICATIONS: None  ENDOSCOPIC IMPRESSION: 1.   FEDA DUE TO CAMERONS ULCERS/EROSIONS 2.   Two COLON polyps REMOVED r 3.   The LEFT colon IS redundant 4.   Moderate sized external and SMALL  internal hemorrhoids  RECOMMENDATIONS: STOP PROTONIX GTT THEN BID PPI FOR 3 MOS THEN DAILY LOW FAT DIET AWAIT BIOPSY PT IS NOT A CANDIDATE FOR LONG TERM ANTICOAGULATION UNLESS CBC FOLLOWED CLOSELY AND pRBCs GIVEN PRN.  DISCUSSED WITH SON AND DAUGHTER. OPV W/ DR.  FIELDS IN 3 MOS    _______________________________ eSignedDanie Binder, MD 12-27-2014 5:55 PM   CPT CODES: ICD CODES:  The ICD and CPT codes recommended by this software are interpretations from the data that the clinical staff has captured with the software.  The verification of the translation of this report to the ICD and CPT codes and modifiers is the sole responsibility of the health care institution and practicing physician where this report was generated.  [place]. will not be held responsible for the validity of the ICD and CPT codes included on this report.  AMA assumes no liability for data contained or not contained herein. CPT is a Designer, television/film set of the [employer].

## 2014-12-06 NOTE — Care Management Note (Addendum)
    Page 1 of 1   12/09/2014     1:49:28 PM CARE MANAGEMENT NOTE 12/09/2014  Patient:  Keith Doyle, Keith Doyle   Account Number:  [id number]  Date Initiated:  12/06/2014  Documentation initiated by:  Theophilus Kinds  Subjective/Objective Assessment:   Pt admitted from home with anemia. Pt lives alone and will return home at discharge.     Action/Plan:   Pt currently out of room for procedure. Will continue to follow for discharge planning needs. ? need for [facility] at discharge.   Anticipated DC Date:  12/08/2014   Anticipated DC Plan:  [place]  CM consult      Choice offered to / List presented to:             Status of service:  Completed, signed off Medicare Important Message given?  YES (If response is "NO", the following Medicare IM given date fields will be blank) Date Medicare IM given:  12/06/2014 Medicare IM given by:  Christinia Gully C Date Additional Medicare IM given:  12/09/2014 Additional Medicare IM given by:  Theophilus Kinds  Discharge Disposition:  HOME/SELF CARE  Per UR Regulation:    If discussed at Long Length of Stay Meetings, dates discussed:    Comments:  12/09/14 [place], RN BSN CM Potential discharge today once anticoagulation regimine confirmed. No CM needs noted. Pt stated that he lives alone but has 2 sons and a daughter who is very active in the care of the pt. Pt has a cane for prn use.  12/06/14 [place], RN BSN CM

## 2014-12-06 NOTE — Progress Notes (Signed)
[facility] PROGRESS NOTE  CAROLD EISNER WLN:[id number] DOB: 07/22/1936 DOA: 12/05/2014 PCP: [name], FNP  Assessment/Plan: Anemia: Hemoglobin 8.0 s/p 2 units PRBC.  Likely acute from blood loss from a GI bleed in setting of coumadin for hx afib and DVT s/p IVC filter. Hemoccult positive. INR 2.5 this am s/p vitamin K. Last colonoscopy 10 years ago. GI evaluated and recommend another 10mg  Vit K IV, 1 unit FFP, recheck INR 2pm and plan for colonoscopy with possible EGD today. for a possible scope. Continue to hold coumadin and transition Protonix gtt to po per GI. Appreciate GI assistance.  Afib: Paroxysmal. CHADSVASC score = 9.7%/year stroke risk. Currently with HR range 47-70.  Will obtain EKG as unable to find one done on admission.  Holding coumadin due to GI bleed and supratherapeutic INR. Monitor on telemetry.  AKI: Cr 1.25. Baseline .9. Likely from severe anemia. Await today BMET. Continue IV fluids. Hold nephrotoxins. Monitor urine output  Back pain: chronic.  Lumbar plain film reveals mild wedging of the T12 and L1 vertebral bodies is stable from previous, likely physiologic remodeling. There is no convincing fracture. No subluxation. No notable disc narrowing. Continue home robaxin as needed  Diabetes: Last A1c 6.0 as of January 2016. A1c pending. CBG range 91-95. Holding home metformin. Continue SSI. Monitor.   Chronic diastolic congestive heart failure:: Compensated.  Echo 2013 with EF 55-60% and grade 1 diastolic dysfunction. Compensated. Monitor daily weight and obtain intake and output.  Continue Lasix, potassium  HLD:  Continue statin  HTN: controlled.  Continue Norvasc, benazepril, Lasix  Peripheral neuropathy: stable at baseline.  Continue Neurontin   Code Status: full Family Communication: daughter at bedside Disposition Plan: home when ready hopefully tomorrow   Consultants:  GI Dr fields  Procedures:  Colonoscopy  12/06/14  Antibiotics:  none  HPI/Subjective: Sitting up watching TV. Smiling. Denies pain/discomfort.   Objective: Filed Vitals:   12/06/14 1315  BP: 152/74  Pulse: 47  Temp: 97.9 F (36.6 C)  Resp: 18    Intake/Output Summary (Last 24 hours) at 12/06/14 1341 Last data filed at 12/06/14 1330  Gross per 24 hour  Intake   1170 ml  Output   1350 ml  Net   -180 ml   Filed Weights   12/05/14 1710 12/05/14 2017  Weight: 100.699 kg (222 lb) 102.468 kg (225 lb 14.4 oz)    Exam:   General:  Well nourished HOH  Cardiovascular: RRR no MGR trace LE edema  Respiratory: normal effort BS clear bilaterally no crackles or wheeze  Abdomen: obese soft +BS non-tender to palpation  Musculoskeletal: no clubbing or cyanosis   Data Reviewed: Basic Metabolic Panel:  Recent Labs Lab 12/05/14 1531 12/05/14 1733  NA 143 139  K 4.4 3.6  CL 107 109  CO2 24 22  GLUCOSE 84 166*  BUN 16 18  CREATININE 1.19 1.25  CALCIUM 9.2 8.8   Liver Function Tests:  Recent Labs Lab 12/05/14 1531 12/05/14 1733  AST 16 22  ALT 18 21  ALKPHOS 35* 33*  BILITOT <0.2 0.5  PROT 5.5* 5.7*  ALBUMIN  --  3.4*   No results for input(s): LIPASE, AMYLASE in the last 168 hours. No results for input(s): AMMONIA in the last 168 hours. CBC:  Recent Labs Lab 12/05/14 1540 12/05/14 1733 12/06/14 0637  WBC 5.4 4.6 3.7*  NEUTROABS  --  2.7  --   HGB 6.6* 6.3* 8.0*  HCT 21.8* 21.3* 25.4*  MCV 82.0  86.6 86.7  PLT  --  220 215   Cardiac Enzymes: No results for input(s): CKTOTAL, CKMB, CKMBINDEX, TROPONINI in the last 168 hours. BNP (last 3 results) No results for input(s): BNP in the last 8760 hours.  ProBNP (last 3 results) No results for input(s): PROBNP in the last 8760 hours.  CBG:  Recent Labs Lab 12/05/14 2233 12/06/14 0739 12/06/14 1216  GLUCAP 119* 91 95    No results found for this or any previous visit (from the past 240 hour(s)).   Studies: Dg Lumbar Spine  Complete  12/05/2014   CLINICAL DATA:  Weakness and falls.  Back pain.  Initial encounter.  EXAM: LUMBAR SPINE - COMPLETE 4+ VIEW  COMPARISON:  Abdominal CT 09/01/2010  FINDINGS: Mild wedging of the T12 and L1 vertebral bodies is stable from previous, likely physiologic remodeling. There is no convincing fracture. No subluxation. No notable disc narrowing.  No focal bone lesion or endplate erosion. There is chronic tilting of the IVC filter with the apex to the right.  IMPRESSION: No acute osseous findings.   Electronically Signed   By: Monte Fantasia M.D.   On: 12/05/2014 20:13    Scheduled Meds: . sodium chloride   Intravenous Once  . amLODipine  10 mg Oral Daily  . atorvastatin  40 mg Oral q1800  . benazepril  40 mg Oral BID  . fenofibrate  160 mg Oral Daily  . furosemide  20 mg Oral Daily  . gabapentin  800 mg Oral BID  . insulin aspart  0-9 Units Subcutaneous TID WC  . [START ON 12/07/2014] pantoprazole  40 mg Oral QAC breakfast  . [START ON 12/09/2014] pantoprazole (PROTONIX) IV  40 mg Intravenous Q12H  . potassium chloride SA  20 mEq Oral BID  . sodium chloride  3 mL Intravenous Q12H   Continuous Infusions: . pantoprozole (PROTONIX) infusion 8 mg/hr (12/06/14 1203)    Principal Problem:   Anemia Active Problems:   Essential hypertension   BPH (benign prostatic hyperplasia)   Peripheral neuropathy   Diabetes mellitus with neuropathy   AKI (acute kidney injury)   Lumbar back pain   Paroxysmal a-fib   Chronic diastolic congestive heart failure   HLD (hyperlipidemia)    Time spent: 35 minutes    [facility] Pager [phone]. If 7PM-7AM, please contact night-coverage at [email or web address], password [facility] 12/06/2014, 1:41 PM  LOS: 1 day

## 2014-12-06 NOTE — Progress Notes (Signed)
UR chart review completed.  

## 2014-12-07 ENCOUNTER — Encounter (HOSPITAL_COMMUNITY): Payer: Self-pay | Admitting: Internal Medicine

## 2014-12-07 DIAGNOSIS — K649 Unspecified hemorrhoids: Secondary | ICD-10-CM | POA: Diagnosis present

## 2014-12-07 DIAGNOSIS — K259 Gastric ulcer, unspecified as acute or chronic, without hemorrhage or perforation: Secondary | ICD-10-CM

## 2014-12-07 DIAGNOSIS — K635 Polyp of colon: Secondary | ICD-10-CM

## 2014-12-07 DIAGNOSIS — E1140 Type 2 diabetes mellitus with diabetic neuropathy, unspecified: Secondary | ICD-10-CM

## 2014-12-07 HISTORY — DX: Polyp of colon: K63.5

## 2014-12-07 HISTORY — DX: Gastric ulcer, unspecified as acute or chronic, without hemorrhage or perforation: K25.9

## 2014-12-07 HISTORY — DX: Unspecified hemorrhoids: K64.9

## 2014-12-07 LAB — CBC
HCT: 24 % — ABNORMAL LOW (ref 39.0–52.0)
HCT: 28.8 % — ABNORMAL LOW (ref 39.0–52.0)
HEMOGLOBIN: 7.4 g/dL — AB (ref 13.0–17.0)
HEMOGLOBIN: 9.1 g/dL — AB (ref 13.0–17.0)
MCH: 26.6 pg (ref 26.0–34.0)
MCH: 27.1 pg (ref 26.0–34.0)
MCHC: 30.8 g/dL (ref 30.0–36.0)
MCHC: 31.6 g/dL (ref 30.0–36.0)
MCV: 86.3 fL (ref 78.0–100.0)
MCV: 85.7 fL (ref 78.0–100.0)
PLATELETS: 199 10*3/uL (ref 150–400)
Platelets: 203 10*3/uL (ref 150–400)
RBC: 2.78 MIL/uL — AB (ref 4.22–5.81)
RBC: 3.36 MIL/uL — AB (ref 4.22–5.81)
RDW: 15.9 % — ABNORMAL HIGH (ref 11.5–15.5)
RDW: 15.3 % (ref 11.5–15.5)
WBC: 4.5 10*3/uL (ref 4.0–10.5)
WBC: 5.9 10*3/uL (ref 4.0–10.5)

## 2014-12-07 LAB — GLUCOSE, CAPILLARY
GLUCOSE-CAPILLARY: 119 mg/dL — AB (ref 70–99)
Glucose-Capillary: 118 mg/dL — ABNORMAL HIGH (ref 70–99)
Glucose-Capillary: 100 mg/dL — ABNORMAL HIGH (ref 70–99)
Glucose-Capillary: 114 mg/dL — ABNORMAL HIGH (ref 70–99)
Glucose-Capillary: 105 mg/dL — ABNORMAL HIGH (ref 70–99)
Glucose-Capillary: 103 mg/dL — ABNORMAL HIGH (ref 70–99)

## 2014-12-07 LAB — BASIC METABOLIC PANEL
ANION GAP: 4 — AB (ref 5–15)
BUN: 10 mg/dL (ref 6–23)
CO2: 23 mmol/L (ref 19–32)
Calcium: 8.5 mg/dL (ref 8.4–10.5)
Chloride: 120 mmol/L — ABNORMAL HIGH (ref 96–112)
Creatinine, Ser: 1.03 mg/dL (ref 0.50–1.35)
GFR calc non Af Amer: 68 mL/min — ABNORMAL LOW (ref >90)
GFR, EST AFRICAN AMERICAN: 79 mL/min — AB (ref >90)
Glucose, Bld: 118 mg/dL — ABNORMAL HIGH (ref 70–99)
Potassium: 3.8 mmol/L (ref 3.5–5.1)
Sodium: 147 mmol/L — ABNORMAL HIGH (ref 135–145)

## 2014-12-07 LAB — FOLATE: Folate: >20 ng/mL

## 2014-12-07 LAB — PROTIME-INR
INR: 1.3 (ref 0.00–1.49)
Prothrombin Time: 16.3 seconds — ABNORMAL HIGH (ref 11.6–15.2)

## 2014-12-07 LAB — PREPARE FRESH FROZEN PLASMA: Unit division: 0

## 2014-12-07 LAB — HEMOGLOBIN A1C
Hgb A1c MFr Bld: 6.3 % — ABNORMAL HIGH (ref 4.8–5.6)
Mean Plasma Glucose: 134 mg/dL

## 2014-12-07 LAB — VITAMIN B12: Vitamin B-12: 243 pg/mL (ref 211–911)

## 2014-12-07 LAB — TSH: TSH: 3.117 u[IU]/mL (ref 0.350–4.500)

## 2014-12-07 LAB — FERRITIN: Ferritin: 6 ng/mL — ABNORMAL LOW (ref 22–322)

## 2014-12-07 LAB — PREPARE RBC (CROSSMATCH)

## 2014-12-07 MED ORDER — AMLODIPINE BESYLATE 5 MG PO TABS
5.0000 mg | ORAL_TABLET | Freq: Every day | ORAL | Status: DC
  Administered 2014-12-07: 5 mg via ORAL

## 2014-12-07 MED ORDER — POTASSIUM CHLORIDE IN NACL 20-0.45 MEQ/L-% IV SOLN
INTRAVENOUS | Status: DC
  Administered 2014-12-07 – 2014-12-08 (×3): via INTRAVENOUS
  Filled 2014-12-07 (×4): qty 1000

## 2014-12-07 MED ORDER — PANTOPRAZOLE SODIUM 40 MG PO TBEC: 40.0000 mg | DELAYED_RELEASE_TABLET | Freq: Every day | ORAL | Status: DC

## 2014-12-07 MED ORDER — PANTOPRAZOLE SODIUM 40 MG PO TBEC
40.0000 mg | DELAYED_RELEASE_TABLET | Freq: Two times a day (BID) | ORAL | Status: DC
Start: 2014-12-08 — End: 2014-12-09
  Administered 2014-12-08 – 2014-12-09 (×4): 40 mg via ORAL
  Filled 2014-12-07 (×4): qty 1

## 2014-12-07 MED ORDER — FUROSEMIDE 10 MG/ML IJ SOLN
20.0000 mg | Freq: Once | INTRAMUSCULAR | Status: AC
  Administered 2014-12-07: 20 mg via INTRAVENOUS
  Filled 2014-12-07: qty 2

## 2014-12-07 MED ORDER — SODIUM CHLORIDE 0.9 % IV SOLN
Freq: Once | INTRAVENOUS | Status: AC
  Administered 2014-12-07: 12:00:00 via INTRAVENOUS

## 2014-12-07 NOTE — Progress Notes (Signed)
[facility] PROGRESS NOTE  Keith Doyle UEA:[id number] DOB: 30-Apr-1936 DOA: 12/05/2014 PCP: [name], FNP    Code Status: Full code Family Communication: Discussed with daughter Disposition Plan: Discharge to home when clinically appropriate.   Consultants:  Gastroenterologist, Dr. Oneida Alar  Procedures: EGD and colonoscopy on 4/1:  ENDOSCOPIC IMPRESSION: - FEDA DUE TO CAMERONS ULCERS/EROSIONS -Two COLON polyps REMOVED  -The LEFT colon IS redundant -Moderate sized external and SMALL internal hemorrhoids  Antibiotics:  None  HPI/Subjective: The patient had a bowel movement this morning and he does not recall seeing any blood or black tarriness. He has no complaints of chest pain or shortness of breath or abdominal pain.  Objective: Filed Vitals:   12/07/14 1234  BP: 136/56  Pulse: 66  Temp: 98.8 F (37.1 C)  Resp: 16    Intake/Output Summary (Last 24 hours) at 12/07/14 1303 Last data filed at 12/07/14 1300  Gross per 24 hour  Intake   2291 ml  Output    975 ml  Net   1316 ml   Filed Weights   12/05/14 1710 12/05/14 2017  Weight: 100.699 kg (222 lb) 102.468 kg (225 lb 14.4 oz)    Exam:   General:  79 year old Caucasian man in no acute distress.  Cardiovascular: Irregular, irregular with a soft systolic murmur and bradycardia.  Respiratory: Clear to auscultation anteriorly with decreased breath sounds in the bases.  Abdomen: Mildly obese, positive bowel sounds, soft, nontender, nondistended.  Musculoskeletal/extremities: No pedal edema; no acute hot red joints.  Neurologic: He is alert and oriented 3; cranial nerves II through XII are intact.   Data Reviewed: Basic Metabolic Panel:  Recent Labs Lab 12/05/14 1531 12/05/14 1733 12/06/14 1737 12/07/14 0635  NA 143 139 144 147*  K 4.4 3.6 3.6 3.8  CL 107 109 114* 120*  CO2 24 22 22 23   GLUCOSE 84 166* 98 118*  BUN 16 18 13 10   CREATININE 1.19 1.25 1.06 1.03  CALCIUM 9.2 8.8 8.6 8.5    Liver Function Tests:  Recent Labs Lab 12/05/14 1531 12/05/14 1733  AST 16 22  ALT 18 21  ALKPHOS 35* 33*  BILITOT <0.2 0.5  PROT 5.5* 5.7*  ALBUMIN  --  3.4*   No results for input(s): LIPASE, AMYLASE in the last 168 hours. No results for input(s): AMMONIA in the last 168 hours. CBC:  Recent Labs Lab 12/05/14 12/05/14 1540 12/05/14 1733 12/06/14 0637 12/06/14 1737 12/07/14 0635  WBC 4.8 5.4 4.6 3.7* 4.9 4.5  NEUTROABS 2.6  --  2.7  --   --   --   HGB 6.6* 6.6* 6.3* 8.0* 8.3* 7.4*  HCT 21.3* 21.8* 21.3* 25.4* 27.0* 24.0*  MCV 82 82.0 86.6 86.7 85.7 86.3  PLT 245  --  220 215 208 199   Cardiac Enzymes: No results for input(s): CKTOTAL, CKMB, CKMBINDEX, TROPONINI in the last 168 hours. BNP (last 3 results) No results for input(s): BNP in the last 8760 hours.  ProBNP (last 3 results) No results for input(s): PROBNP in the last 8760 hours.  CBG:  Recent Labs Lab 12/06/14 1653 12/06/14 2251 12/07/14 0710 12/07/14 0750 12/07/14 1146  GLUCAP 94 118* 100* 114* 105*    No results found for this or any previous visit (from the past 240 hour(s)).   Studies: Dg Lumbar Spine Complete  12/05/2014   CLINICAL DATA:  Weakness and falls.  Back pain.  Initial encounter.  EXAM: LUMBAR SPINE - COMPLETE 4+ VIEW  COMPARISON:  Abdominal CT 09/01/2010  FINDINGS: Mild wedging of the T12 and L1 vertebral bodies is stable from previous, likely physiologic remodeling. There is no convincing fracture. No subluxation. No notable disc narrowing.  No focal bone lesion or endplate erosion. There is chronic tilting of the IVC filter with the apex to the right.  IMPRESSION: No acute osseous findings.   Electronically Signed   By: Monte Fantasia M.D.   On: 12/05/2014 20:13    Scheduled Meds: . [START ON 12/08/2014] amLODipine  5 mg Oral Daily  . atorvastatin  40 mg Oral q1800  . benazepril  40 mg Oral BID  . fenofibrate  160 mg Oral Daily  . furosemide  20 mg Intravenous Once  .  furosemide  20 mg Oral Daily  . gabapentin  800 mg Oral BID  . insulin aspart  0-9 Units Subcutaneous TID WC  . [START ON 12/08/2014] pantoprazole  40 mg Oral BID AC  . potassium chloride SA  20 mEq Oral BID  . sodium chloride  3 mL Intravenous Q12H   Continuous Infusions: . 0.45 % NaCl with KCl 20 mEq / L 65 mL/hr at 12/07/14 1148   Assessment and plan:  Principal Problem:   Anemia due to chronic blood loss Active Problems:   Warfarin-induced coagulopathy   Multiple gastric ulcers   Diabetes mellitus with neuropathy   Paroxysmal a-fib   Colon polyps   Hemorrhoids   Essential hypertension   BPH (benign prostatic hyperplasia)   Peripheral neuropathy   AKI (acute kidney injury)   Lumbar back pain   Chronic diastolic congestive heart failure   HLD (hyperlipidemia)   Bradycardia   1. Anemia secondary to chronic blood, from multiple large gastric Cameron ulcers. The patient's hemoglobin was 6.3 on admission. His INR was 3.14. Anemia per old revealed a total iron of 29, ferritin of 6, folate greater than 20, and vitamin B12 at 243.  Coumadin was discontinued. He was given vitamin K orally initially and then subsequently IV vitamin K and 1 unit of fresh frozen plasma. He was transfused 2 units of packed red blood cells. He was started on a Protonix drip. Gastroneurology was consulted. Dr. Oneida Alar evaluated the patient and proceeded with an EGD and colonoscopy. The results revealed multiple large nonbleeding [name] erosions/ulcers in the gastric body and gastric fundus-status post multiple biopsies. Also noted was a large hiatal hernia. The colonoscopy revealed 2 colon polyps which were removed and moderate sized external/internal hemorrhoids. The Protonix was discontinued in favor of twice a day oral Protonix. The patient's diet was advanced. Follow-up hemoglobin drifted down to 7.4. Therefore, he is being transfused another 2 units of packed red blood cells. We'll continue to hold  Coumadin for now and pending Dr. Oneida Alar conversation with the patient's primary cardiologist Dr. Percival Spanish.  Chronic paroxysmal atrial fibrillation, on chronic anticoagulation. As above, the patient is INR was 3.14 on admission. Coumadin was withheld and anticoagulation was reversed with fresh frozen plasma and vitamin K. His heart rate has been within normal limits to bradycardic. His TSH was assessed and was within normal limits. The dilemma is when to restart Coumadin or if to restart it. The risk and benefits are being considered. This will be discussed further with the medical service, gastroenterology, and cardiology. Will hold Coumadin for 1 week per gastroenterology.  History of DVT, on chronic Coumadin and status post IVC filter. As above, Coumadin is currently on hold.  Chronic diastolic heart failure. Currently stable and compensated. Ejection fraction  55-60% with grade 1 diastolic dysfunction per echo in 2013. Will give Lasix between units of packed red blood cells. Continue oral Lasix otherwise.  Mild hypernatremia. Will change IV fluids to half-normal saline.  Diabetes mellitus with neuropathy. The patient's hemoglobin A1c was 6.3 indicating good outpatient control. We'll continue to hold metformin and treat with sliding scale NovoLog.  Hypertension. His blood pressure is controlled currently. We'll continue benazepril and amlodipine.  Mild acute kidney injury. Secondary to hypovolemia and chronic GI bleeding. His creatinine has improved following IV fluid hydration.   Time spent: 35 minutes    [place] [facility] Pager [phone]. If 7PM-7AM, please contact night-coverage at [email or web address], password [facility] 12/07/2014, 1:03 PM  LOS: 2 days

## 2014-12-07 NOTE — Progress Notes (Addendum)
Patient ID: Keith Doyle, male   DOB: 06/18/1936, 79 y.o.   MRN: [id number]   Assessment/Plan: ADMITTED WITH FEDA-NO BRBPR OR MELENA. S/P TCS: 2 POLYPS REMOVED AND EGD-CAMERON'S EROSIONS/ULCERS.   PLAN: 1. AWAIT BIOPSY 2. BID PPI 3. WILL DISCUSS LONG TERM ANTICOAGULATION CHALLENGES WITH DR. HOCHREIN ON MON. 4. Transfuse prn 5. ANTICIPATE D/C IN NEXT 48 HRS   Subjective: Since I last evaluated the patient HE HAS HAD ONE BM. NO BRBPR OR MELENA. HE HAS No questions or concerns except he thinks the SCDs are preventing him from using the bathroom.  Objective: Vital signs in last 24 hours: Filed Vitals:   12/07/14 0713  BP: 130/75  Pulse: 68  Temp: 98.2 F (36.8 C)  Resp: 20   General appearance: alert, cooperative and no distress Resp: clear to auscultation bilaterally Cardio: irregularly irregular rhythm GI: soft, non-tender; bowel sounds normal; no masses,  no organomegaly  Lab Results: Mar 31         Apr 1          Apr 2 Hb 6.3--> (8.0--> 8.3)--> 7.4   Studies/Results: No results found.  Medications: I have reviewed the patient's current medications.   LOS: 5 days   Barney Drain 02/14/2014, 2:23 PM

## 2014-12-08 DIAGNOSIS — D62 Acute posthemorrhagic anemia: Secondary | ICD-10-CM | POA: Insufficient documentation

## 2014-12-08 LAB — TYPE AND SCREEN
ABO/RH(D): A POS
ANTIBODY SCREEN: NEGATIVE
UNIT DIVISION: 0
UNIT DIVISION: 0
Unit division: 0
Unit division: 0

## 2014-12-08 LAB — CBC
HCT: 29.5 % — ABNORMAL LOW (ref 39.0–52.0)
Hemoglobin: 9.4 g/dL — ABNORMAL LOW (ref 13.0–17.0)
MCH: 27.6 pg (ref 26.0–34.0)
MCHC: 31.9 g/dL (ref 30.0–36.0)
MCV: 86.8 fL (ref 78.0–100.0)
Platelets: 206 10*3/uL (ref 150–400)
RBC: 3.4 MIL/uL — ABNORMAL LOW (ref 4.22–5.81)
RDW: 15.4 % (ref 11.5–15.5)
WBC: 6 10*3/uL (ref 4.0–10.5)

## 2014-12-08 LAB — GLUCOSE, CAPILLARY
GLUCOSE-CAPILLARY: 138 mg/dL — AB (ref 70–99)
GLUCOSE-CAPILLARY: 124 mg/dL — AB (ref 70–99)
Glucose-Capillary: 127 mg/dL — ABNORMAL HIGH (ref 70–99)
Glucose-Capillary: 86 mg/dL (ref 70–99)

## 2014-12-08 LAB — BASIC METABOLIC PANEL
Anion gap: 7 (ref 5–15)
BUN: 10 mg/dL (ref 6–23)
CO2: 21 mmol/L (ref 19–32)
CREATININE: 1.06 mg/dL (ref 0.50–1.35)
Calcium: 8.8 mg/dL (ref 8.4–10.5)
Chloride: 118 mmol/L — ABNORMAL HIGH (ref 96–112)
GFR calc non Af Amer: 66 mL/min — ABNORMAL LOW (ref >90)
GFR, EST AFRICAN AMERICAN: 77 mL/min — AB (ref >90)
Glucose, Bld: 112 mg/dL — ABNORMAL HIGH (ref 70–99)
POTASSIUM: 3.8 mmol/L (ref 3.5–5.1)
Sodium: 146 mmol/L — ABNORMAL HIGH (ref 135–145)

## 2014-12-08 LAB — PROTIME-INR
INR: 1.13 (ref 0.00–1.49)
PROTHROMBIN TIME: 14.7 s (ref 11.6–15.2)

## 2014-12-08 LAB — T4, FREE: Free T4: 0.8 ng/dL (ref 0.80–1.80)

## 2014-12-08 MED ORDER — DICLOFENAC SODIUM 1 % TD GEL
2.0000 g | Freq: Three times a day (TID) | TRANSDERMAL | Status: DC
  Administered 2014-12-08 – 2014-12-09 (×5): 2 g via TOPICAL
  Filled 2014-12-08: qty 100

## 2014-12-08 MED ORDER — POTASSIUM CL IN DEXTROSE 5% 20 MEQ/L IV SOLN
20.0000 meq | INTRAVENOUS | Status: DC
  Administered 2014-12-08 (×2): 20 meq via INTRAVENOUS

## 2014-12-08 MED ORDER — POTASSIUM CHLORIDE CRYS ER 10 MEQ PO TBCR
10.0000 meq | EXTENDED_RELEASE_TABLET | Freq: Two times a day (BID) | ORAL | Status: DC
  Administered 2014-12-08 – 2014-12-09 (×2): 10 meq via ORAL
  Filled 2014-12-08 (×2): qty 1

## 2014-12-08 MED ORDER — ACETAMINOPHEN 325 MG PO TABS
650.0000 mg | ORAL_TABLET | Freq: Four times a day (QID) | ORAL | Status: DC | PRN
  Administered 2014-12-08: 650 mg via ORAL
  Filled 2014-12-08: qty 2

## 2014-12-08 NOTE — Progress Notes (Signed)
Patient ID: Keith Doyle, male   DOB: 25-Jun-1936, 79 y.o.   MRN: [id number]   Assessment/Plan: ADMITTED WITH IRON DEFICIENCY ANEMIA DUE TO CAMERON'S ULCERS WHILE USING COUMADIN.  PLAN: 1. BID PPI 2. CONTINUE TO MONITOR SYMPTOMS-BRBPR OR MELENA. 3. WILL DISCUSS COUMADIN WITH DR. Inkom APR 4.   Subjective: Since I last evaluated the patient HIS BLOOD COUNT IS STABLE. TOLERATING POs. NO BRBPR OR MELENA.  Objective: Vital signs in last 24 hours: Filed Vitals:   12/08/14 0651  BP: 154/74  Pulse: 57  Temp: 98.5 F (36.9 C)  Resp: 20   General appearance: alert, cooperative and no distress Resp: clear to auscultation bilaterally Cardio: irregularly irregular rhythm GI: soft, non-tender; bowel sounds normal; no masses,  no organomegaly  Lab Results:  Hb 9.1-9.4  Studies/Results: No results found.  Medications: I have reviewed the patient's current medications.   LOS: 5 days   Barney Drain 02/14/2014, 2:23 PM

## 2014-12-08 NOTE — Progress Notes (Signed)
[facility] PROGRESS NOTE  Keith Doyle EXB:[id number] DOB: [birth year] DOA: 12/05/2014 PCP: [name], FNP    Code Status: Full code Family Communication: Discussed with daughter Disposition Plan: Discharge to home when clinically appropriate.   Consultants:  Gastroenterologist, Dr. Oneida Alar  Procedures: EGD and colonoscopy on 4/1:  ENDOSCOPIC IMPRESSION: - FEDA DUE TO CAMERONS ULCERS/EROSIONS -Two COLON polyps REMOVED  -The LEFT colon IS redundant -Moderate sized external and SMALL internal hemorrhoids  Antibiotics:  None  HPI/Subjective: The patient had a bowel movement this morning and he does not recall seeing any blood or black tarriness. He has no complaints of chest pain or shortness of breath or abdominal pain. He does complain of left knee pain which he says is chronic. He was previously given steroid injections by his orthopedic doctor.  Objective: Filed Vitals:   12/08/14 0651  BP: 154/74  Pulse: 57  Temp: 98.5 F (36.9 C)  Resp: 20    Intake/Output Summary (Last 24 hours) at 12/08/14 1416 Last data filed at 12/08/14 0650  Gross per 24 hour  Intake   1693 ml  Output   1350 ml  Net    343 ml   Filed Weights   12/05/14 1710 12/05/14 2017  Weight: 100.699 kg (222 lb) 102.468 kg (225 lb 14.4 oz)    Exam:   General:  79 year old Caucasian man in no acute distress.  Cardiovascular: Irregular, irregular with a soft systolic murmur and bradycardia.  Respiratory: Clear to auscultation anteriorly with decreased breath sounds in the bases.  Abdomen: Mildly obese, positive bowel sounds, soft, nontender, nondistended.  Musculoskeletal/extremities: Left knee is mildly swollen infrapatellar region; mildly to moderately tender; no erythema or significant warmth; good range of motion with flexion and extension.  Neurologic: He is alert and oriented 3; cranial nerves II through XII are intact.   Data Reviewed: Basic Metabolic Panel:  Recent  Labs Lab 12/05/14 1531 12/05/14 1733 12/06/14 1737 12/07/14 0635 12/08/14 0635  NA 143 139 144 147* 146*  K 4.4 3.6 3.6 3.8 3.8  CL 107 109 114* 120* 118*  CO2 24 22 22 23 21   GLUCOSE 84 166* 98 118* 112*  BUN 16 18 13 10 10   CREATININE 1.19 1.25 1.06 1.03 1.06  CALCIUM 9.2 8.8 8.6 8.5 8.8   Liver Function Tests:  Recent Labs Lab 12/05/14 1531 12/05/14 1733  AST 16 22  ALT 18 21  ALKPHOS 35* 33*  BILITOT <0.2 0.5  PROT 5.5* 5.7*  ALBUMIN  --  3.4*   No results for input(s): LIPASE, AMYLASE in the last 168 hours. No results for input(s): AMMONIA in the last 168 hours. CBC:  Recent Labs Lab 12/05/14  12/05/14 1733 12/06/14 0637 12/06/14 1737 12/07/14 0635 12/07/14 1920 12/08/14 0635  WBC 4.8  < > 4.6 3.7* 4.9 4.5 5.9 6.0  NEUTROABS 2.6  --  2.7  --   --   --   --   --   HGB 6.6*  < > 6.3* 8.0* 8.3* 7.4* 9.1* 9.4*  HCT 21.3*  < > 21.3* 25.4* 27.0* 24.0* 28.8* 29.5*  MCV 82  < > 86.6 86.7 85.7 86.3 85.7 86.8  PLT 245  --  220 215 208 199 203 206  < > = values in this interval not displayed. Cardiac Enzymes: No results for input(s): CKTOTAL, CKMB, CKMBINDEX, TROPONINI in the last 168 hours. BNP (last 3 results) No results for input(s): BNP in the last 8760 hours.  ProBNP (last 3 results)  No results for input(s): PROBNP in the last 8760 hours.  CBG:  Recent Labs Lab 12/07/14 1146 12/07/14 1617 12/07/14 2032 12/08/14 0818 12/08/14 1139  GLUCAP 105* 119* 103* 127* 138*    No results found for this or any previous visit (from the past 240 hour(s)).   Studies: No results found.  Scheduled Meds: . atorvastatin  40 mg Oral q1800  . benazepril  40 mg Oral BID  . diclofenac sodium  2 g Topical TID  . fenofibrate  160 mg Oral Daily  . furosemide  20 mg Oral Daily  . gabapentin  800 mg Oral BID  . insulin aspart  0-9 Units Subcutaneous TID WC  . pantoprazole  40 mg Oral BID AC  . potassium chloride SA  20 mEq Oral BID  . sodium chloride  3 mL  Intravenous Q12H   Continuous Infusions: . 0.45 % NaCl with KCl 20 mEq / L Stopped (12/08/14 1036)   Assessment and plan:  Principal Problem:   Anemia due to chronic blood loss Active Problems:   Warfarin-induced coagulopathy   Multiple gastric ulcers   Diabetes mellitus with neuropathy   Paroxysmal a-fib   Colon polyps   Hemorrhoids   Essential hypertension   BPH (benign prostatic hyperplasia)   Peripheral neuropathy   AKI (acute kidney injury)   Lumbar back pain   Chronic diastolic congestive heart failure   HLD (hyperlipidemia)   Bradycardia   1. Anemia secondary to chronic blood, from multiple large gastric Cameron ulcers. The patient's hemoglobin was 6.3 on admission. His INR was 3.14. Anemia per old revealed a total iron of 29, ferritin of 6, folate greater than 20, and vitamin B12 at 243.  Coumadin was discontinued. He was given vitamin K orally initially and then subsequently IV vitamin K and 1 unit of fresh frozen plasma. He was transfused 2 units of packed red blood cells. He was started on a Protonix drip. Gastroneurology was consulted. Dr. Oneida Alar evaluated the patient and proceeded with an EGD and colonoscopy. The results revealed multiple large nonbleeding [name] erosions/ulcers in the gastric body and gastric fundus-status post multiple biopsies. Also noted was a large hiatal hernia. The colonoscopy revealed 2 colon polyps which were removed and moderate sized external/internal hemorrhoids. The Protonix was discontinued in favor of twice a day oral Protonix. The patient's diet was advanced. Follow-up hemoglobin drifted down to 7.4. Therefore, he is being transfused another 2 units of packed red blood cells. His hemoglobin improved appropriately. We'll continue to hold Coumadin for now, pending Dr. Oneida Alar conversation with the patient's primary cardiologist Dr. Percival Spanish.  Chronic paroxysmal atrial fibrillation, on chronic anticoagulation. As above, the patient is INR  was 3.14 on admission. Coumadin was withheld and anticoagulation was reversed with fresh frozen plasma and vitamin K. His heart rate has been within normal limits to bradycardic. Norvasc is being held to see if it has any effect on his heart rate. His TSH was assessed and was within normal limits. The dilemma is when to restart Coumadin or if to restart it. The risk and benefits are being considered. This will be discussed further with the medical service, gastroenterology, and cardiology. Will hold Coumadin for 1 week per gastroenterology.  History of DVT, on chronic Coumadin and status post IVC filter. As above, Coumadin is currently on hold.  Chronic diastolic heart failure. Currently stable and compensated. Ejection fraction 55-60% with grade 1 diastolic dysfunction per echo in 2013. He was given IV Lasix between units 3 and  4 packed red blood cells. Continue oral Lasix otherwise.  Mild hypernatremia. IV fluids were changed to half-normal saline with minimal improvement. Will start free water with D5W.  Diabetes mellitus with neuropathy. The patient's hemoglobin A1c was 6.3 indicating good outpatient control. We'll continue to hold metformin and treat with sliding scale NovoLog.  Hypertension. His blood pressure is trending up, but reasonable. We'll benazepril, but hold Norvasc to see if it has any effect on his heart rate. Consider adding HCTZ.  Mild acute kidney injury. Secondary to hypovolemia and chronic GI bleeding. His creatinine has improved following IV fluid hydration.  Chronic left knee pain. We'll add topical Voltaren 3 times a day. Ambulation in the room encouraged.  Time spent: 35 minutes    [place] [facility] Pager [phone]. If 7PM-7AM, please contact night-coverage at [email or web address], password [facility] 12/08/2014, 2:16 PM  LOS: 3 days

## 2014-12-09 ENCOUNTER — Encounter (HOSPITAL_COMMUNITY): Payer: Self-pay | Admitting: Internal Medicine

## 2014-12-09 DIAGNOSIS — R001 Bradycardia, unspecified: Secondary | ICD-10-CM

## 2014-12-09 LAB — BASIC METABOLIC PANEL
Anion gap: 6 (ref 5–15)
BUN: 9 mg/dL (ref 6–23)
CHLORIDE: 113 mmol/L — AB (ref 96–112)
CO2: 25 mmol/L (ref 19–32)
Calcium: 8.8 mg/dL (ref 8.4–10.5)
Creatinine, Ser: 1.03 mg/dL (ref 0.50–1.35)
GFR calc Af Amer: 79 mL/min — ABNORMAL LOW (ref >90)
GFR calc non Af Amer: 68 mL/min — ABNORMAL LOW (ref >90)
GLUCOSE: 114 mg/dL — AB (ref 70–99)
POTASSIUM: 3.5 mmol/L (ref 3.5–5.1)
Sodium: 144 mmol/L (ref 135–145)

## 2014-12-09 LAB — CBC
HEMATOCRIT: 29.1 % — AB (ref 39.0–52.0)
HEMOGLOBIN: 9.4 g/dL — AB (ref 13.0–17.0)
MCH: 27.6 pg (ref 26.0–34.0)
MCHC: 32.3 g/dL (ref 30.0–36.0)
MCV: 85.3 fL (ref 78.0–100.0)
Platelets: 201 10*3/uL (ref 150–400)
RBC: 3.41 MIL/uL — AB (ref 4.22–5.81)
RDW: 15.4 % (ref 11.5–15.5)
WBC: 4.6 10*3/uL (ref 4.0–10.5)

## 2014-12-09 LAB — GLUCOSE, CAPILLARY
Glucose-Capillary: 106 mg/dL — ABNORMAL HIGH (ref 70–99)
Glucose-Capillary: 127 mg/dL — ABNORMAL HIGH (ref 70–99)
Glucose-Capillary: 121 mg/dL — ABNORMAL HIGH (ref 70–99)

## 2014-12-09 MED ORDER — BENAZEPRIL HCL 40 MG PO TABS: 40.0000 mg | ORAL_TABLET | Freq: Two times a day (BID) | ORAL | Status: DC

## 2014-12-09 MED ORDER — METHOCARBAMOL 500 MG PO TABS: 500.0000 mg | ORAL_TABLET | Freq: Three times a day (TID) | ORAL | Status: DC | PRN

## 2014-12-09 MED ORDER — FENOFIBRATE 160 MG PO TABS: 160.0000 mg | ORAL_TABLET | Freq: Every day | ORAL | Status: DC

## 2014-12-09 MED ORDER — PANTOPRAZOLE SODIUM 40 MG PO TBEC: 40.0000 mg | DELAYED_RELEASE_TABLET | Freq: Two times a day (BID) | ORAL | Status: DC

## 2014-12-09 MED ORDER — FUROSEMIDE 20 MG PO TABS: 20.0000 mg | ORAL_TABLET | Freq: Every day | ORAL | Status: DC

## 2014-12-09 MED ORDER — FUROSEMIDE 20 MG PO TABS
20.0000 mg | ORAL_TABLET | Freq: Every day | ORAL | Status: DC
Start: 2014-12-09 — End: 2014-12-10

## 2014-12-09 MED ORDER — METFORMIN HCL 500 MG PO TABS: 500.0000 mg | ORAL_TABLET | Freq: Two times a day (BID) | ORAL | Status: DC

## 2014-12-09 MED ORDER — GABAPENTIN 400 MG PO CAPS: 800.0000 mg | ORAL_CAPSULE | Freq: Two times a day (BID) | ORAL | Status: DC

## 2014-12-09 MED ORDER — WARFARIN SODIUM 5 MG PO TABS: 5.0000 mg | ORAL_TABLET | Freq: Every day | ORAL | Status: DC

## 2014-12-09 MED ORDER — ATORVASTATIN CALCIUM 40 MG PO TABS: 40.0000 mg | ORAL_TABLET | Freq: Every day | ORAL | Status: DC

## 2014-12-09 MED ORDER — DICLOFENAC SODIUM 1 % TD GEL: 2.0000 g | Freq: Three times a day (TID) | TRANSDERMAL | Status: DC

## 2014-12-09 MED ORDER — POTASSIUM CHLORIDE CRYS ER 20 MEQ PO TBCR: 10.0000 meq | EXTENDED_RELEASE_TABLET | Freq: Two times a day (BID) | ORAL | Status: DC

## 2014-12-09 MED ORDER — AMLODIPINE BESYLATE 10 MG PO TABS: 10.0000 mg | ORAL_TABLET | Freq: Every day | ORAL | Status: DC

## 2014-12-09 NOTE — Progress Notes (Signed)
    Subjective: Doing well today. Denies abdominal pain, N/V. Tolerating diet well. Denies any recurrent GI bleeding, has a bowel movement yesterday which he doesn't think had any blood in it but "I didn't get a good look." No note in the flowsheet about stool. Denies dizziness, lightheadedness, chest pain, shortness of breath. No complaints today.  Objective: Vital signs in last 24 hours: Temp:  [97.8 F (36.6 C)-99.1 F (37.3 C)] 98.2 F (36.8 C) (04/04 0545) Pulse Rate:  [56-68] 68 (04/04 0545) Resp:  [18-20] 18 (04/04 0545) BP: (127-138)/(60-69) 138/69 mmHg (04/04 0545) SpO2:  [94 %-97 %] 94 % (04/04 0545) Last BM Date: 12/08/14 General:   Alert and oriented, pleasant. Restingcomfortably in bed visiting with family Head:  Normocephalic and atraumatic. Heart:  S1, S2 present, no murmurs noted.  Lungs: Clear to auscultation bilaterally, without wheezing, rales, or rhonchi.  Abdomen:  Bowel sounds present, soft, non-tender, non-distended. No HSM or hernias noted. No rebound or guarding. No masses appreciated  Pulses:  Normal DP pulses noted. Extremities:  Without clubbing or edema. Psych:  Alert and cooperative. Normal mood and affect.  Intake/Output from previous day: 04/03 0701 - 04/04 0700 In: 2003.3 [P.O.:920; I.V.:1083.3] Out: 2250 [Urine:2250] Intake/Output this shift:    Lab Results:  Recent Labs  12/07/14 1920 12/08/14 0635 12/09/14 0727  WBC 5.9 6.0 4.6  HGB 9.1* 9.4* 9.4*  HCT 28.8* 29.5* 29.1*  PLT 203 206 201   BMET  Recent Labs  12/07/14 0635 12/08/14 0635 12/09/14 0727  NA 147* 146* 144  K 3.8 3.8 3.5  CL 120* 118* 113*  CO2 23 21 25   GLUCOSE 118* 112* 114*  BUN 10 10 9   CREATININE 1.03 1.06 1.03  CALCIUM 8.5 8.8 8.8   LFT No results for input(s): PROT, ALBUMIN, AST, ALT, ALKPHOS, BILITOT, BILIDIR, IBILI in the last 72 hours. PT/INR  Recent Labs  12/07/14 0635 12/08/14 0635  LABPROT 16.3* 14.7  INR 1.30 1.13   Hepatitis Panel No  results for input(s): HEPBSAG, HCVAB, HEPAIGM, HEPBIGM in the last 72 hours.   Studies/Results: No results found.  Assessment: 79 year old male with new onset of symptomatic anemia. Presenting symptoms included fatigue and weakness, dizziness, shortness of breath. On admission his H&H was 6.3/21.3. Has been transfused appropriately. Colonoscopy and endoscopy this admission with polypectomy x 2, pathology pending. EGD showed Cameron's erosions/ulcers. Dr. Oneida Alar to discuss long term anticoagulation issues with primary cardiologist.   Currently asymptomatic, feeling well, no complaints. H/H stable today 9.4/29.1. Complaining of knee pain which is chronic for him.    Plan: 1. Continue to monitor for recurrent GI bleed or drop in H/H 2. Continue bid PPI 3. Await biopsy 4. Likely discharge soon when anticoagulation discussion between GI and cardiology and decisions made. [facility] NP Dyanne Carrel requesting to be notified of decision [phone]    Walden Field, AGNP-C Adult & Gerontological Nurse Practitioner [facility]     LOS: 4 days    12/09/2014, 9:40 AM

## 2014-12-09 NOTE — Discharge Summary (Signed)
Physician Discharge Summary  Keith Doyle OEV:[id number] DOB: 12-Feb-1936 DOA: 12/05/2014  PCP: [name], FNP  Admit date: 12/05/2014 Discharge date: 12/09/2014  Time spent: 40 minutes  Recommendations for Outpatient Follow-up:  1. PCP in 1 week for evaluation of anemia. Recommend cbc to track Hg 2. Dr Jillyn Ledger office will contact you to schedule appointment to evaluation risk/benefit coumadin 3. Follow up with Dr Oneida Alar 4 weeks to evaluate GI bleed  Discharge Diagnoses:  Principal Problem:   Anemia due to chronic blood loss Active Problems:   Essential hypertension   BPH (benign prostatic hyperplasia)   Peripheral neuropathy   Diabetes mellitus with neuropathy   AKI (acute kidney injury)   Lumbar back pain   Paroxysmal a-fib   Chronic diastolic congestive heart failure   HLD (hyperlipidemia)   Warfarin-induced coagulopathy   Bradycardia   Multiple gastric ulcers   Colon polyps   Hemorrhoids   Anemia due to acute blood loss   Discharge Condition: stable  Diet recommendation: Heart healthy  Filed Weights   12/05/14 1710 12/05/14 2017  Weight: 100.699 kg (222 lb) 102.468 kg (225 lb 14.4 oz)    History of present illness:  Keith Doyle is a 79 y.o. male sent to ED on 12/05/14 by primary care physician due to low hemoglobin. Patient stated he was told by his primary care physician that his hemoglobin was 6 and he needed to go to the emergency room. Patient with two-month history of weakness. Patient with numerous falls and dizziness during this period of time. Stable left arm fracture 3 weeks prior. Patient on Coumadin for treatment of a previous DVT and possible mini stroke. Denied melena, hematochezia, bright red blood per rectum. Weakness was constant and gradually getting worse.  Patient reported last colonoscopy 10 years prior with removal of small benign tumors unsure of when he was told to come back for further colonoscopy  Patient stated he has some kind of  filter in place to Blood clots   Hospital Course:  1. Anemia secondary to chronic blood, from multiple large gastric Cameron ulcers. The patient's hemoglobin was 6.3 on admission. His INR was 3.14. Anemia per old revealed a total iron of 29, ferritin of 6, folate greater than 20, and vitamin B12 at 243. Coumadin was discontinued. He was given vitamin K orally initially and then subsequently IV vitamin K and 1 unit of fresh frozen plasma. He was transfused 2 units of packed red blood cells. He was started on a Protonix drip. Gastroneurology was consulted. Dr. Oneida Alar evaluated the patient and proceeded with an EGD and colonoscopy. The results revealed multiple large nonbleeding [name] erosions/ulcers in the gastric body and gastric fundus-status post multiple biopsies. Also noted was a large hiatal hernia. The colonoscopy revealed 2 colon polyps which were removed and moderate sized external/internal hemorrhoids. The Protonix was discontinued in favor of twice a day oral Protonix. The patient's diet was advanced. Follow-up hemoglobin drifted down to 7.4. Therefore, he was transfused another 2 units of packed red blood cells. His hemoglobin improved appropriately. At discharge hg 9.4. Dr. Oneida Alar discussed timing of resuming coumadin with patient's primary cardiologist Dr. Percival Spanish and recommended coumadin be resumed 12/15/14 and Dr Jillyn Ledger office will contact to discuss further. Patient instructed to resume coumadin 12/15/14.  Chronic paroxysmal atrial fibrillation, on chronic anticoagulation. As above, the patient was INR was 3.14 on admission. Coumadin was withheld and anticoagulation was reversed with fresh frozen plasma and vitamin K. His heart rate was within normal  limits to bracycardic. Norvasc was held to see if it has any effect on his heart rate which it did not so this was resumed at discharge.  His TSH was assessed and was within normal limits.  Gastroenterology discussed resumption of  coumadin with cardiology. Recommendation to hold coumadin until 12/15/14 and Cardiology office will contact patient to schedule appointment to follow.   History of DVT, on chronic Coumadin and status post IVC filter. Coumadin on hold at discharge as noted above to be resumed 12/15/14.   Chronic diastolic heart failure. Remained stable and compensated. Ejection fraction 55-60% with grade 1 diastolic dysfunction per echo in 2013. He was given IV Lasix between units 3 and 4 packed red blood cells.   Mild hypernatremia. Resolved at discharge  Diabetes mellitus with neuropathy. The patient's hemoglobin A1c was 6.3 indicating good outpatient control. Will resume home regimen  Hypertension. His blood pressure  reasonable. Norvasc held for 24 hours as noted above and benazapril continued. At discharge norvasc resumed.   Mild acute kidney injury. Secondary to hypovolemia and chronic GI bleeding. His creatinine has improved following IV fluid hydration.  Chronic left knee pain. Fair relief with Voltaren cream 3 times a day. Ambulated in hall on day of discharge without problem.    Procedures: EGD and colonoscopy on 4/1: ENDOSCOPIC IMPRESSION: - FEDA DUE TO CAMERONS ULCERS/EROSIONS -Two COLON polyps REMOVED  -The LEFT colon IS redundant -Moderate sized external and SMALL internal hemorrhoids   Consultations:  Gastroenterology  Discharge Exam: Filed Vitals:   12/09/14 1452  BP: 138/67  Pulse: 59  Temp: 99.4 F (37.4 C)  Resp: 18    General: well nourished appears comfortable. HOH Cardiovascular: irregularly irregular, HR low end of normal +murmur no LE edema Respiratory: normal effort BS somewhat distant but clear bilaterally i hear no wheeze Abdomen: non-distended, soft, +BS non-tender to palpation  Discharge Instructions    Current Discharge Medication List    START taking these medications   Details  diclofenac sodium (VOLTAREN) 1 % GEL Apply 2 g topically 3  (three) times daily. Qty: 1 Tube, Refills: 1    methocarbamol (ROBAXIN) 500 MG tablet Take 1-2 tablets (500-1,000 mg total) by mouth every 8 (eight) hours as needed for muscle spasms. Qty: 20 tablet, Refills: 0    pantoprazole (PROTONIX) 40 MG tablet Take 1 tablet (40 mg total) by mouth 2 (two) times daily before a meal. Qty: 60 tablet, Refills: 1      CONTINUE these medications which have CHANGED   Details  amLODipine (NORVASC) 10 MG tablet Take 1 tablet (10 mg total) by mouth daily.    atorvastatin (LIPITOR) 40 MG tablet Take 1 tablet (40 mg total) by mouth daily.    benazepril (LOTENSIN) 40 MG tablet Take 1 tablet (40 mg total) by mouth 2 (two) times daily.    fenofibrate 160 MG tablet Take 1 tablet (160 mg total) by mouth daily.    furosemide (LASIX) 20 MG tablet Take 1 tablet (20 mg total) by mouth daily. Qty: 180 tablet, Refills: 2    gabapentin (NEURONTIN) 400 MG capsule Take 2 capsules (800 mg total) by mouth 2 (two) times daily.    metFORMIN (GLUCOPHAGE) 500 MG tablet Take 1 tablet (500 mg total) by mouth 2 (two) times daily with a meal.    potassium chloride SA (K-DUR,KLOR-CON) 20 MEQ tablet Take 0.5 tablets (10 mEq total) by mouth 2 (two) times daily.    warfarin (COUMADIN) 5 MG tablet Take 1 tablet (5  mg total) by mouth daily. Hold until 12/15/14 at which time you may resume.      CONTINUE these medications which have NOT CHANGED   Details  Cholecalciferol (VITAMIN D3) 5000 UNITS CAPS Take 1 capsule by mouth daily.    Cinnamon 500 MG capsule Take 1,000 mg by mouth daily.    ferrous sulfate 325 (65 FE) MG tablet Take 325 mg by mouth daily with breakfast.     fish oil-omega-3 fatty acids 1000 MG capsule Take 1 g by mouth daily.     meclizine (ANTIVERT) 50 MG tablet Take 1 tablet (50 mg total) by mouth 2 (two) times daily as needed. Qty: 60 tablet, Refills: 11   Associated Diagnoses: BPPV (benign paroxysmal positional vertigo), unspecified laterality    glucose  blood test strip Use to check blood glucose once daily.  Dx:  Type 2 diabetes E11.9 Qty: 100 each, Refills: 3       No Known Allergies Follow-up Information    Follow up with [name], FNP On 12/16/2014.   Specialty:  Nurse Practitioner   Why:  follow up on anemia, weakness. recommend CBC. has appointment 12/16/14 at 12:25   Contact information:   [place] [address] [phone]       Follow up with Barney Drain, MD On 01/13/2015.   Specialty:  Gastroenterology   Why:  For Follow up appointment at 9:15   Contact information:   [place] [address] [place] [address] [phone]        The results of significant diagnostics from this hospitalization (including imaging, microbiology, ancillary and laboratory) are listed below for reference.    Significant Diagnostic Studies: Dg Lumbar Spine Complete  12/05/2014   CLINICAL DATA:  Weakness and falls.  Back pain.  Initial encounter.  EXAM: LUMBAR SPINE - COMPLETE 4+ VIEW  COMPARISON:  Abdominal CT 09/01/2010  FINDINGS: Mild wedging of the T12 and L1 vertebral bodies is stable from previous, likely physiologic remodeling. There is no convincing fracture. No subluxation. No notable disc narrowing.  No focal bone lesion or endplate erosion. There is chronic tilting of the IVC filter with the apex to the right.  IMPRESSION: No acute osseous findings.   Electronically Signed   By: Monte Fantasia M.D.   On: 12/05/2014 20:13    Microbiology: No results found for this or any previous visit (from the past 240 hour(s)).   Labs: Basic Metabolic Panel:  Recent Labs Lab 12/05/14 1733 12/06/14 1737 12/07/14 0635 12/08/14 0635 12/09/14 0727  NA 139 144 147* 146* 144  K 3.6 3.6 3.8 3.8 3.5  CL 109 114* 120* 118* 113*  CO2 22 22 23 21 25   GLUCOSE 166* 98 118* 112* 114*  BUN 18 13 10 10 9   CREATININE 1.25 1.06 1.03 1.06 1.03  CALCIUM 8.8 8.6 8.5 8.8 8.8   Liver Function  Tests:  Recent Labs Lab 12/05/14 1531 12/05/14 1733  AST 16 22  ALT 18 21  ALKPHOS 35* 33*  BILITOT <0.2 0.5  PROT 5.5* 5.7*  ALBUMIN  --  3.4*   No results for input(s): LIPASE, AMYLASE in the last 168 hours. No results for input(s): AMMONIA in the last 168 hours. CBC:  Recent Labs Lab 12/05/14  12/05/14 1733  12/06/14 1737 12/07/14 0635 12/07/14 1920 12/08/14 0635 12/09/14 0727  WBC 4.8  < > 4.6  < > 4.9 4.5 5.9 6.0 4.6  NEUTROABS 2.6  --  2.7  --   --   --   --   --   --   HGB 6.6*  < > 6.3*  < > 8.3* 7.4* 9.1* 9.4* 9.4*  HCT 21.3*  < > 21.3*  < > 27.0* 24.0* 28.8* 29.5* 29.1*  MCV 82  < > 86.6  < > 85.7 86.3 85.7 86.8 85.3  PLT 245  --  220  < > 208 199 203 206 201  < > = values in this interval not displayed. Cardiac Enzymes: No results for input(s): CKTOTAL, CKMB, CKMBINDEX, TROPONINI in the last 168 hours. BNP: BNP (last 3 results) No results for input(s): BNP in the last 8760 hours.  ProBNP (last 3 results) No results for input(s): PROBNP in the last 8760 hours.  CBG:  Recent Labs Lab 12/08/14 1139 12/08/14 1714 12/08/14 2119 12/09/14 0803 12/09/14 1212  GLUCAP 138* 86 124* 106* 127*       Signed:  Jaxon Mynhier M  [facility] 12/09/2014, 4:21 PM

## 2014-12-09 NOTE — Progress Notes (Addendum)
Patient discharged with instructions, prescription, and care notes.  Verbalized understanding via teach back.  IV was removed and the site was WNL. Patient voiced no further complaints or concerns at the time of discharge.  Appointments scheduled per instructions.  Patient left the floor via w/c with staff and family in stable condition. 

## 2014-12-10 ENCOUNTER — Other Ambulatory Visit: Payer: Self-pay | Admitting: Cardiology

## 2014-12-10 NOTE — Care Management Utilization Note (Signed)
UR completed 

## 2014-12-11 ENCOUNTER — Telehealth: Payer: Self-pay | Admitting: Cardiology

## 2014-12-11 NOTE — Telephone Encounter (Signed)
Closed encounter °

## 2014-12-12 ENCOUNTER — Other Ambulatory Visit: Payer: Self-pay | Admitting: *Deleted

## 2014-12-13 NOTE — Patient Outreach (Signed)
12/12/14-  Referral received from [facility] case manager Dot Lanes RN (called into [facility] to discuss pt), pt discharged from hospital on 12/09/14 with diagnosis anemia. Telephone call for Transition of Care week 1, spoke with pt. HIPAA verified, pt hard of hearing, unable to reconcile medication list over the phone, pt states he has all medications and taking as prescribed, when pt questioned about diagnosis of CHF, pt states " no one ever said anything about that"  Pt has scale but does not weigh, pt states "[employer] nurse used to help him make sure he had everything he needed".   Pt agreeable to home visit to complete assessments due to pt cannot hear parts of the conversation.  Pt reports he has several appointments and [place] next week and will be very busy, requests initial home visit on 12/26/14, pt agreeable to weekly transition of care calls. Sent message to Lurline Del to mail consent to pt home.  [facility] CM Care Plan        Patient Outreach Telephone from 12/12/2014 in [place] Problem One  difficulty navigating health care system   Care Plan for Problem One  Active   [facility] Long Term Goal (31-90 days)  pt will be able to navigate health care system (MD appointments, insurance questions, reportable signs/ symptoms) within 90 days   [facility] Long Term Goal Start Date  12/12/14   [facility] CM Short Term Goal #1 (0-30 days)  pt will keep all scheduled appointments- primary MD 12/16/14  within one week   [facility] CM Short Term Goal #1 Start Date  12/12/14   [facility] CM Short Term Goal #2 (0-30 days)  pt will verbalize all scheduled appointments within 30 days.   [facility] CM Short Term Goal #2 Start Date  12/12/14     PLAN Continue with weekly transition of care calls- due 12/20/14. Follow up with initial home visit on 12/26/14. Ask if pt kept MD appointments  [name] [facility], BSN [place] Coordinator [phone]

## 2014-12-16 ENCOUNTER — Encounter: Payer: Self-pay | Admitting: Family

## 2014-12-16 ENCOUNTER — Ambulatory Visit (INDEPENDENT_AMBULATORY_CARE_PROVIDER_SITE_OTHER): Payer: Commercial Managed Care - HMO | Admitting: Family

## 2014-12-16 VITALS — BP 147/78 | HR 59 | Temp 97.9°F | Ht 72.0 in | Wt 221.0 lb

## 2014-12-16 DIAGNOSIS — D62 Acute posthemorrhagic anemia: Secondary | ICD-10-CM

## 2014-12-16 DIAGNOSIS — I480 Paroxysmal atrial fibrillation: Secondary | ICD-10-CM | POA: Diagnosis not present

## 2014-12-16 DIAGNOSIS — Z09 Encounter for follow-up examination after completed treatment for conditions other than malignant neoplasm: Secondary | ICD-10-CM | POA: Diagnosis not present

## 2014-12-16 LAB — POCT INR: INR: 1

## 2014-12-16 LAB — POCT HEMOGLOBIN: Hemoglobin: 10.1 g/dL — AB (ref 14.1–18.1)

## 2014-12-16 NOTE — Patient Instructions (Signed)

## 2014-12-16 NOTE — Progress Notes (Signed)
   Subjective:    Patient ID: Keith Doyle, male    DOB: [birth year], 79 y.o.   MRN: [id number]  HPI Pt presents to the office for hospital follow up. Pt was admitted to the hospital on 12/05/14 for anemia rt chronic blood loss  To multiple large gastric cameron ulcers.  Pt has follow up appt with Dr. Warren Lacy to discuss risk/benefit of coudmadin for A Fib and follow up appt with Dr. Oneida Alar to discuss GI bleed. Pt states he is feeling better. Pt started his warfarin yesterday.   *Reviewed pt's hospital notes.    Review of Systems  Constitutional: Negative.   HENT: Negative.   Respiratory: Negative.   Cardiovascular: Negative.   Gastrointestinal: Negative.   Endocrine: Negative.   Genitourinary: Negative.   Musculoskeletal: Negative.   Neurological: Negative.   Hematological: Negative.   Psychiatric/Behavioral: Negative.   All other systems reviewed and are negative.      Objective:   Physical Exam  Constitutional: He is oriented to person, place, and time. He appears well-developed and well-nourished. No distress.  HENT:  Head: Normocephalic.  Right Ear: External ear normal.  Left Ear: External ear normal.  Nose: Nose normal.  Mouth/Throat: Oropharynx is clear and moist.  Eyes: Pupils are equal, round, and reactive to light. Right eye exhibits no discharge. Left eye exhibits no discharge.  Neck: Normal range of motion. Neck supple. No thyromegaly present.  Cardiovascular: Normal rate, regular rhythm, normal heart sounds and intact distal pulses.   No murmur heard. Pulmonary/Chest: Effort normal and breath sounds normal. No respiratory distress. He has no wheezes.  Abdominal: Soft. Bowel sounds are normal. He exhibits no distension. There is no tenderness.  Musculoskeletal: Normal range of motion. He exhibits no edema or tenderness.  Neurological: He is alert and oriented to person, place, and time. He has normal reflexes. No cranial nerve deficit.  Skin: Skin is warm and dry.  No rash noted. No erythema.  Psychiatric: He has a normal mood and affect. His behavior is normal. Judgment and thought content normal.  Vitals reviewed.   BP 147/78 mmHg  Pulse 59  Temp(Src) 97.9 F (36.6 C) (Oral)  Ht 6' (1.829 m)  Wt 221 lb (100.245 kg)  BMI 29.97 kg/m2       Assessment & Plan:  1. Paroxysmal atrial fibrillation - POCT INR - POCT hemoglobin  2. Hospital discharge follow-up  -Talked to pt s/s of bleeding- If pt notices any bleeding in urine, stool, or in general pt to RTO. -Pt to keep all appts with Cardiologists and GI!!! -Pt started taking his Warfarin yesterday- Pt to RTO to have INR checked regularly- Appt scheduled on 4/14 -Falls precaution discussed  Evelina Dun, FNP

## 2014-12-19 ENCOUNTER — Ambulatory Visit (INDEPENDENT_AMBULATORY_CARE_PROVIDER_SITE_OTHER): Payer: Commercial Managed Care - HMO | Admitting: Pharmacist

## 2014-12-19 DIAGNOSIS — I480 Paroxysmal atrial fibrillation: Secondary | ICD-10-CM | POA: Diagnosis not present

## 2014-12-19 LAB — POCT INR: INR: 1.3

## 2014-12-19 NOTE — Patient Instructions (Signed)
Anticoagulation Dose Instructions as of 12/19/2014      Dorene Grebe Tue Wed Thu Fri Sat   New Dose 5 mg 2.5 mg 5 mg 5 mg 5 mg 2.5 mg 5 mg    Description        Take 1 and 1/2 tablets today, then start warfarin dose of 1/2 tablet on mondays and fridays and 1 tablet all other days.     INR was 1.3 today

## 2014-12-20 ENCOUNTER — Other Ambulatory Visit: Payer: Self-pay | Admitting: *Deleted

## 2014-12-20 NOTE — Patient Outreach (Addendum)
12/20/14-  Telephone call for transition of care week 2, spoke with pt, HIPAA verified, pt reports he cannot talk much as he has errands to do and will be leaving home shortly.  Pt reports he has all medications, kept appointment at primary MD on 12/16/14,  Has not been consistently checking CBG or weighing and states " I wasn't told to weigh"   Pt checks CBG intermittently,  Pt having difficulty hearing over the phone, will discuss weighing and checking blood sugar at initial home visit next week.  [place]        Patient Outreach Telephone from 12/20/2014 in [place] Problem One  difficulty navigating health care system   Care Plan for Problem One  Active   [facility] Long Term Goal (31-90 days)  pt will be able to navigate health care system (MD appointments, insurance questions, reportable signs/ symptoms) within 90 days   [facility] Long Term Goal Start Date  12/12/14   [facility] CM Short Term Goal #1 (0-30 days)  pt will keep all scheduled appointments- primary MD 12/16/14  within one week   [facility] CM Short Term Goal #1 Start Date  12/12/14   [facility] CM Short Term Goal #1 Met Date  12/20/14   [facility] CM Short Term Goal #2 (0-30 days)  pt will verbalize all scheduled appointments within 30 days.   [facility] CM Short Term Goal #2 Start Date  12/12/14      PLAN Follow up with initial home visit on 12/26/14   [name] [facility], [place] Coordinator [phone]

## 2014-12-23 ENCOUNTER — Other Ambulatory Visit: Payer: Self-pay | Admitting: *Deleted

## 2014-12-23 ENCOUNTER — Telehealth: Payer: Self-pay | Admitting: Gastroenterology

## 2014-12-23 ENCOUNTER — Encounter: Payer: Self-pay | Admitting: Gastroenterology

## 2014-12-23 DIAGNOSIS — S52312D Greenstick fracture of shaft of radius, left arm, subsequent encounter for fracture with routine healing: Secondary | ICD-10-CM | POA: Diagnosis not present

## 2014-12-23 NOTE — Telephone Encounter (Signed)
Tried to call with no answer  

## 2014-12-23 NOTE — Telephone Encounter (Signed)
Pt is aware of results. 

## 2014-12-23 NOTE — Telephone Encounter (Signed)
Please call pt. His stomach Bx shows gastritis. He had HYPERPLASTIC POLYPS REMOVED.  CONTINUE PROTONIX. TAKE 30 MINUTES PRIOR TO MEALS TWICE DAILY. FOLLOW A LOW FAT DIET.  FOLLOW UP IN 3 MOS E30 ANEMIA/CAMERON'S ULCERS. PLEASE CALL WITH QUESTIONS OR CONCERNS REGARDING B;ACK TARRY STOOLS OR BRBPR.

## 2014-12-23 NOTE — Telephone Encounter (Signed)
APPOINTMENT MADE °

## 2014-12-23 NOTE — Patient Outreach (Signed)
12/23/14-  Received email from [place] stating pt had called into the office requesting phone call from RN CM regarding a question about medication. Telephone call to home number [phone] and no answer to telephone, also called [phone] per pt request and no answer to telephone, left voicemail requesting pt call RN CM back.  By end of day, pt did not call back. PLAN RN CM to see pt for initial home visit on 12/26/14  [name] [facility], [place] Coordinator [phone]

## 2014-12-25 ENCOUNTER — Ambulatory Visit (INDEPENDENT_AMBULATORY_CARE_PROVIDER_SITE_OTHER): Payer: Commercial Managed Care - HMO | Admitting: Pharmacist

## 2014-12-25 DIAGNOSIS — D649 Anemia, unspecified: Secondary | ICD-10-CM | POA: Diagnosis not present

## 2014-12-25 DIAGNOSIS — I480 Paroxysmal atrial fibrillation: Secondary | ICD-10-CM

## 2014-12-25 LAB — POCT CBC
Granulocyte percent: 51.6 %G (ref 37–80)
HCT, POC: 31.3 % — AB (ref 43.5–53.7)
Hemoglobin: 9.2 g/dL — AB (ref 14.1–18.1)
LYMPH, POC: 2.1 (ref 0.6–3.4)
MCH, POC: 24.3 pg — AB (ref 27–31.2)
MCHC: 29.3 g/dL — AB (ref 31.8–35.4)
MCV: 82.8 fL (ref 80–97)
MPV: 7.2 fL (ref 0–99.8)
POC Granulocyte: 2.7 (ref 2–6.9)
POC LYMPH %: 39.5 % (ref 10–50)
Platelet Count, POC: 330 10*3/uL (ref 142–424)
RBC: 3.79 M/uL — AB (ref 4.69–6.13)
RDW, POC: 16.9 %
WBC: 5.3 10*3/uL (ref 4.6–10.2)

## 2014-12-25 LAB — POCT INR: INR: 2.2

## 2014-12-25 NOTE — Progress Notes (Signed)
Subjective:     Indication: atrial fibrillation, DVT and TIA Bleeding signs/symptoms: None Thromboembolic signs/symptoms: None  Missed Coumadin doses: None Medication changes: no Dietary changes: no Bacterial/viral infection: no Other concerns: yes - patient was hospitalized end of march for anemia and bleeding gastric ulcers.  His warfarin was stopped in hospital but then restarted about 2 weeks ago.  His HGB was 9.4 at hospital discharge and then 10.1 on 12/16/14.  Today HGB was 9.3  Current warfarin dose is 2.5mg  on mondays adn fridays and 5mg  all other days.   Objective:    INR Today: 2.2 (this is up from 1.3 - 4 days ago)   Assessment:    Therapeutic INR for goal of 2-3   Plan:    1. Discussed HBG and INR with Evelina Dun, NP who saw patient after he returned home from hospital for hospital follow up 2.  New dose: hold warfarin today since HGB is declining.  RTC tomorrow to recheck HGB.    Cherre Robins, PharmD, CPP

## 2014-12-26 ENCOUNTER — Other Ambulatory Visit: Payer: Self-pay | Admitting: *Deleted

## 2014-12-26 ENCOUNTER — Other Ambulatory Visit (INDEPENDENT_AMBULATORY_CARE_PROVIDER_SITE_OTHER): Payer: Commercial Managed Care - HMO

## 2014-12-26 ENCOUNTER — Encounter: Payer: Self-pay | Admitting: *Deleted

## 2014-12-26 DIAGNOSIS — D649 Anemia, unspecified: Secondary | ICD-10-CM

## 2014-12-26 LAB — POCT CBC
Granulocyte percent: 60.9 %G (ref 37–80)
HEMATOCRIT: 31.3 % — AB (ref 43.5–53.7)
Hemoglobin: 9.6 g/dL — AB (ref 14.1–18.1)
LYMPH, POC: 2 (ref 0.6–3.4)
MCH, POC: 25.1 pg — AB (ref 27–31.2)
MCHC: 30.7 g/dL — AB (ref 31.8–35.4)
MCV: 81.5 fL (ref 80–97)
MPV: 7.6 fL (ref 0–99.8)
POC Granulocyte: 3.8 (ref 2–6.9)
POC LYMPH PERCENT: 32.5 %L (ref 10–50)
Platelet Count, POC: 340 10*3/uL (ref 142–424)
RBC: 3.84 M/uL — AB (ref 4.69–6.13)
RDW, POC: 16.9 %
WBC: 6.2 10*3/uL (ref 4.6–10.2)

## 2014-12-26 NOTE — Progress Notes (Signed)
Lab only 

## 2014-12-26 NOTE — Patient Outreach (Signed)
[facility]) Care Management   12/26/2014  Keith Doyle 1936/04/10 [id number]  Keith Doyle is an 79 y.o. male  Subjective: Initial home visit with pt,  HIPAA verified, [facility] program explained, consent signed. Pt sitting at kitchen table, states does not check blood sugar now because "readings are always so good and I was told I didn't need to" , pt states he eats brought in restaurant food almost daily ([place], [place], etc) that his daughter brings him.  Pt oversees his own medications and uses prefilled med box.  Pt states he does not weigh and has never been instructed to do so, reports he knows he has diagnosis of heart failure but has not had any complications from this. Pt states " I mostly need help every once in awhile when I get a hospital bill that I can't pay, I need someone to get on the phone and help with that at times"  Objective:   Filed Vitals:   12/26/14 1216  BP: 118/64  Pulse: 62  Resp: 18  Fasting CBG today 88 ROS  Physical Exam  Constitutional: He is oriented to person, place, and time. He appears well-developed and well-nourished.  HENT:  Head: Normocephalic.  Neck: Normal range of motion.  Cardiovascular: Normal rate.   Irregular rhythym  Respiratory: Breath sounds normal.  Dyspnea with exertion  GI: Soft. Bowel sounds are normal.  Musculoskeletal: Normal range of motion. He exhibits no edema.  Neurological: He is alert and oriented to person, place, and time.  Skin: Skin is warm and dry.  Psychiatric: He has a normal mood and affect. His behavior is normal. Thought content normal.    Current Medications:   Current Outpatient Prescriptions  Medication Sig Dispense Refill  . amLODipine (NORVASC) 10 MG tablet Take 1 tablet (10 mg total) by mouth daily.    Marland Kitchen atorvastatin (LIPITOR) 40 MG tablet Take 1 tablet (40 mg total) by mouth daily.    . benazepril (LOTENSIN) 40 MG tablet Take 1 tablet (40 mg total) by mouth 2 (two) times daily.    .  Cholecalciferol (VITAMIN D3) 5000 UNITS CAPS Take 1 capsule by mouth daily.    . Cinnamon 500 MG capsule Take 1,000 mg by mouth daily.    . diclofenac sodium (VOLTAREN) 1 % GEL Apply 2 g topically 3 (three) times daily. 1 Tube 1  . fenofibrate 160 MG tablet Take 1 tablet (160 mg total) by mouth daily.    . ferrous sulfate 325 (65 FE) MG tablet Take 325 mg by mouth daily with breakfast.     . fish oil-omega-3 fatty acids 1000 MG capsule Take 1 g by mouth daily.     . furosemide (LASIX) 20 MG tablet TAKE 1 TABLET TWICE DAILY 180 tablet 2  . gabapentin (NEURONTIN) 400 MG capsule Take 2 capsules (800 mg total) by mouth 2 (two) times daily.    Marland Kitchen glucose blood test strip Use to check blood glucose once daily.  Dx:  Type 2 diabetes E11.9 100 each 3  . meclizine (ANTIVERT) 50 MG tablet Take 1 tablet (50 mg total) by mouth 2 (two) times daily as needed. (Patient taking differently: Take 50 mg by mouth 2 (two) times daily as needed for dizziness. ) 60 tablet 11  . metFORMIN (GLUCOPHAGE) 500 MG tablet Take 1 tablet (500 mg total) by mouth 2 (two) times daily with a meal.    . potassium chloride SA (K-DUR,KLOR-CON) 20 MEQ tablet Take 0.5 tablets (10 mEq  total) by mouth 2 (two) times daily. (Patient taking differently: Take 20 mEq by mouth 2 (two) times daily. )    . Vitamins A & D 5000-400 UNITS CAPS Take 5,000 tablets by mouth daily.    Marland Kitchen warfarin (COUMADIN) 5 MG tablet Take 1 tablet (5 mg total) by mouth daily. Hold until 12/15/14 at which time you may resume.    . methocarbamol (ROBAXIN) 500 MG tablet Take 1-2 tablets (500-1,000 mg total) by mouth every 8 (eight) hours as needed for muscle spasms. (Patient not taking: Reported on 12/26/2014) 20 tablet 0  . pantoprazole (PROTONIX) 40 MG tablet Take 1 tablet (40 mg total) by mouth 2 (two) times daily before a meal. (Patient not taking: Reported on 12/26/2014) 60 tablet 1   No current facility-administered medications for this visit.    Functional Status:   In  your present state of health, do you have any difficulty performing the following activities: 12/26/2014 12/05/2014  Hearing? N -  Vision? N -  Difficulty concentrating or making decisions? N -  Walking or climbing stairs? Y -  Dressing or bathing? N -  Doing errands, shopping? N N  Preparing Food and eating ? N -  Using the Toilet? N -  In the past six months, have you accidently leaked urine? N -  Do you have problems with loss of bowel control? N -  Managing your Medications? N -  Managing your Finances? N -  Housekeeping or managing your Housekeeping? N -    Fall/Depression Screening:    PHQ 2/9 Scores 12/26/2014 12/05/2014 10/03/2014 02/04/2014  PHQ - 2 Score 0 0 0 0    Assessment:  Pt seems to be doing well controlling CHF, diabetes, hypertension, atrial fibrillation.  Pt recently hospitalized for anemia,  RN CM reviewed EMMI handout Anemia of Chronic disease with pt and signs/ symptoms of anemia,  Also reviewed EMMI handout What is Atrial Fibrillation.  Pt is not weighing or checking blood sugar unless instructed to do so by MD citing these disease states are controlled and has had no recent issues.  RN CM reviewed importance of low salt diet (see care plan) as pt eats restaurant food daily, rarely cooks.  Pt family assists him as needed and are available.  Pt drives and is very social.  RN CM reviewed importance of following safety precautions as pt has had falls in the past with injury (due to walking on uneven surfaces)  RN CM explained [facility] program and that pt will most likely be transitioned to telephonic after 2-3 visits.  RN CM reviewed all medications with pt.  Pt not taking protonix and robaxin, does not have on hand, RN CM faxed today's visit with barrier letter to primary MD (pt seeing Dr. Sabra Heck) and reported medications pt not taking.  [facility] CM Care Plan Problem One        Patient Outreach from 12/26/2014 in [place]   Patient Outreach Telephone from 12/20/2014 in  [place]   Patient Outreach Telephone from 12/12/2014 in [place] Problem One  difficulty navigating health care system  difficulty navigating health care system  difficulty navigating health care system   Care Plan for Problem One  Active  Active  Active   [facility] Long Term Goal Start Date  12/12/14  12/12/14  12/12/14   Interventions for Problem One Long Term Goal  RN CM reviewed resources to call for questions regarding health status or any  other health related need (MD, RN CM, 24 hour nurse line, [employer] nurse line)       [facility] CM Short Term Goal #1 (0-30 days)  pt will keep all scheduled appointments- primary MD 12/16/14  within one week  pt will keep all scheduled appointments- primary MD 12/16/14  within one week  pt will keep all scheduled appointments- primary MD 12/16/14  within one week   [facility] CM Short Term Goal #1 Start Date  12/12/14  12/12/14  12/12/14   [facility] CM Short Term Goal #1 Met Date  12/20/14  12/20/14     Interventions for Short Term Goal #1      RN CM instructed pt to read discharge instructions and be mindful of appointment with primary care MD and importance of attending.   [facility] CM Short Term Goal #2 (0-30 days)  pt will verbalize all scheduled appointments within 30 days.  pt will verbalize all scheduled appointments within 30 days.  pt will verbalize all scheduled appointments within 30 days.   [facility] CM Short Term Goal #2 Start Date  12/12/14  12/12/14  12/12/14   Interventions for Short Term Goal #2  RN CM reviewed, reminded pt of appointment with Dr. Oneida Alar 01/13/15 and location of appointment.  Reviewed all appointments on after visit summary and wrote in [facility] calendar.  RN CM reviewed, reminded pt of appointment with Dr. Oneida Alar 01/13/15 and location of appointment.  RN CM reviewed all upcoming appointments, Dr. Oneida Alar 01/13/15- pt states he was not aware of this and states he will attend.   Care Plan Problem Two  Knowledge deficit related to low sodium  diet (for hypertension, CHF although controlled).          Plan:  Follow up with home visit 01/22/15 Follow up low sodium diet, Hypertension Safety  [name] [facility], [place] Coordinator [phone]

## 2014-12-30 ENCOUNTER — Other Ambulatory Visit: Payer: Self-pay | Admitting: *Deleted

## 2014-12-30 ENCOUNTER — Other Ambulatory Visit: Payer: Self-pay | Admitting: Pharmacist

## 2014-12-30 ENCOUNTER — Encounter (HOSPITAL_COMMUNITY): Payer: Self-pay | Admitting: Emergency Medicine

## 2014-12-30 ENCOUNTER — Observation Stay (HOSPITAL_COMMUNITY)
Admission: EM | Admit: 2014-12-30 | Discharge: 2015-01-01 | Disposition: A | Discharge status: Home / Self Care | Payer: Commercial Managed Care - HMO | Attending: Internal Medicine | Admitting: Internal Medicine

## 2014-12-30 ENCOUNTER — Other Ambulatory Visit (INDEPENDENT_AMBULATORY_CARE_PROVIDER_SITE_OTHER): Payer: Commercial Managed Care - HMO

## 2014-12-30 DIAGNOSIS — K259 Gastric ulcer, unspecified as acute or chronic, without hemorrhage or perforation: Secondary | ICD-10-CM | POA: Insufficient documentation

## 2014-12-30 DIAGNOSIS — K2950 Unspecified chronic gastritis without bleeding: Secondary | ICD-10-CM | POA: Insufficient documentation

## 2014-12-30 DIAGNOSIS — N289 Disorder of kidney and ureter, unspecified: Secondary | ICD-10-CM | POA: Diagnosis not present

## 2014-12-30 DIAGNOSIS — Z6829 Body mass index (BMI) 29.0-29.9, adult: Secondary | ICD-10-CM | POA: Diagnosis not present

## 2014-12-30 DIAGNOSIS — E785 Hyperlipidemia, unspecified: Secondary | ICD-10-CM | POA: Insufficient documentation

## 2014-12-30 DIAGNOSIS — D62 Acute posthemorrhagic anemia: Principal | ICD-10-CM | POA: Insufficient documentation

## 2014-12-30 DIAGNOSIS — K449 Diaphragmatic hernia without obstruction or gangrene: Secondary | ICD-10-CM | POA: Insufficient documentation

## 2014-12-30 DIAGNOSIS — I480 Paroxysmal atrial fibrillation: Secondary | ICD-10-CM

## 2014-12-30 DIAGNOSIS — R001 Bradycardia, unspecified: Secondary | ICD-10-CM | POA: Diagnosis present

## 2014-12-30 DIAGNOSIS — N179 Acute kidney failure, unspecified: Secondary | ICD-10-CM | POA: Insufficient documentation

## 2014-12-30 DIAGNOSIS — E860 Dehydration: Secondary | ICD-10-CM | POA: Insufficient documentation

## 2014-12-30 DIAGNOSIS — E669 Obesity, unspecified: Secondary | ICD-10-CM | POA: Diagnosis not present

## 2014-12-30 DIAGNOSIS — N400 Enlarged prostate without lower urinary tract symptoms: Secondary | ICD-10-CM | POA: Diagnosis not present

## 2014-12-30 DIAGNOSIS — E1140 Type 2 diabetes mellitus with diabetic neuropathy, unspecified: Secondary | ICD-10-CM | POA: Diagnosis not present

## 2014-12-30 DIAGNOSIS — Z7901 Long term (current) use of anticoagulants: Secondary | ICD-10-CM | POA: Insufficient documentation

## 2014-12-30 DIAGNOSIS — R42 Dizziness and giddiness: Secondary | ICD-10-CM

## 2014-12-30 DIAGNOSIS — I10 Essential (primary) hypertension: Secondary | ICD-10-CM | POA: Diagnosis not present

## 2014-12-30 DIAGNOSIS — Z79899 Other long term (current) drug therapy: Secondary | ICD-10-CM | POA: Diagnosis not present

## 2014-12-30 DIAGNOSIS — D649 Anemia, unspecified: Secondary | ICD-10-CM

## 2014-12-30 DIAGNOSIS — Z791 Long term (current) use of non-steroidal anti-inflammatories (NSAID): Secondary | ICD-10-CM | POA: Diagnosis not present

## 2014-12-30 DIAGNOSIS — Z86718 Personal history of other venous thrombosis and embolism: Secondary | ICD-10-CM | POA: Insufficient documentation

## 2014-12-30 DIAGNOSIS — D500 Iron deficiency anemia secondary to blood loss (chronic): Secondary | ICD-10-CM | POA: Diagnosis present

## 2014-12-30 DIAGNOSIS — Z8673 Personal history of transient ischemic attack (TIA), and cerebral infarction without residual deficits: Secondary | ICD-10-CM | POA: Diagnosis not present

## 2014-12-30 LAB — CBC WITH DIFFERENTIAL/PLATELET
BASOS ABS: 0 10*3/uL (ref 0.0–0.1)
Basophils Relative: 1 % (ref 0–1)
Eosinophils Absolute: 0.2 10*3/uL (ref 0.0–0.7)
Eosinophils Relative: 4 % (ref 0–5)
HEMATOCRIT: 27.8 % — AB (ref 39.0–52.0)
HEMOGLOBIN: 8.7 g/dL — AB (ref 13.0–17.0)
LYMPHS ABS: 2.2 10*3/uL (ref 0.7–4.0)
Lymphocytes Relative: 44 % (ref 12–46)
MCH: 27.3 pg (ref 26.0–34.0)
MCHC: 31.3 g/dL (ref 30.0–36.0)
MCV: 87.1 fL (ref 78.0–100.0)
Monocytes Absolute: 0.3 10*3/uL (ref 0.1–1.0)
Monocytes Relative: 6 % (ref 3–12)
NEUTROS ABS: 2.3 10*3/uL (ref 1.7–7.7)
NEUTROS PCT: 45 % (ref 43–77)
PLATELETS: 243 10*3/uL (ref 150–400)
RBC: 3.19 MIL/uL — AB (ref 4.22–5.81)
RDW: 17 % — AB (ref 11.5–15.5)
WBC: 5 10*3/uL (ref 4.0–10.5)

## 2014-12-30 LAB — COMPREHENSIVE METABOLIC PANEL
ALT: 22 U/L (ref 0–53)
ANION GAP: 5 (ref 5–15)
AST: 20 U/L (ref 0–37)
Albumin: 3.7 g/dL (ref 3.5–5.2)
Alkaline Phosphatase: 29 U/L — ABNORMAL LOW (ref 39–117)
BILIRUBIN TOTAL: 0.4 mg/dL (ref 0.3–1.2)
BUN: 21 mg/dL (ref 6–23)
CHLORIDE: 106 mmol/L (ref 96–112)
CO2: 27 mmol/L (ref 19–32)
Calcium: 8.7 mg/dL (ref 8.4–10.5)
Creatinine, Ser: 1.43 mg/dL — ABNORMAL HIGH (ref 0.50–1.35)
GFR calc non Af Amer: 46 mL/min — ABNORMAL LOW (ref >90)
GFR, EST AFRICAN AMERICAN: 53 mL/min — AB (ref >90)
Glucose, Bld: 100 mg/dL — ABNORMAL HIGH (ref 70–99)
Potassium: 4 mmol/L (ref 3.5–5.1)
Sodium: 138 mmol/L (ref 135–145)
TOTAL PROTEIN: 5.8 g/dL — AB (ref 6.0–8.3)

## 2014-12-30 LAB — POC OCCULT BLOOD, ED: Fecal Occult Bld: POSITIVE — AB

## 2014-12-30 LAB — POCT CBC
Granulocyte percent: 58.6 %G (ref 37–80)
HCT, POC: 27.7 % — AB (ref 43.5–53.7)
Hemoglobin: 8.3 g/dL — AB (ref 14.1–18.1)
Lymph, poc: 2.1 (ref 0.6–3.4)
MCH, POC: 25 pg — AB (ref 27–31.2)
MCHC: 30 g/dL — AB (ref 31.8–35.4)
MCV: 83.3 fL (ref 80–97)
MPV: 7.6 fL (ref 0–99.8)
PLATELET COUNT, POC: 285 10*3/uL (ref 142–424)
POC Granulocyte: 3.3 (ref 2–6.9)
POC LYMPH PERCENT: 36.1 %L (ref 10–50)
RBC: 3.33 M/uL — AB (ref 4.69–6.13)
RDW, POC: 17.9 %
WBC: 5.7 10*3/uL (ref 4.6–10.2)

## 2014-12-30 LAB — TROPONIN I: Troponin I: <0.03 ng/mL (ref <0.031)

## 2014-12-30 LAB — SAMPLE TO BLOOD BANK

## 2014-12-30 LAB — PROTIME-INR
INR: 1.98 — AB (ref 0.00–1.49)
Prothrombin Time: 22.7 seconds — ABNORMAL HIGH (ref 11.6–15.2)

## 2014-12-30 LAB — POCT INR: INR: 2

## 2014-12-30 NOTE — ED Notes (Signed)
Pt states his Dr's office called and reports his Hgb dropped to 8.3 since 4 days ago.

## 2014-12-30 NOTE — ED Notes (Signed)
Contact number for son Victorio Creeden [phone].

## 2014-12-30 NOTE — Progress Notes (Signed)
LAB ONLY 

## 2014-12-30 NOTE — Patient Outreach (Signed)
12/30/14- Telephone call for transition of care week 4, no answer to telephone, received unidentified voicemail,  Will attempt again at later date.  [name] [facility], [place] Coordinator [phone]
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
## 2014-12-31 ENCOUNTER — Encounter (HOSPITAL_COMMUNITY): Payer: Self-pay | Admitting: Gastroenterology

## 2014-12-31 DIAGNOSIS — I10 Essential (primary) hypertension: Secondary | ICD-10-CM

## 2014-12-31 DIAGNOSIS — N179 Acute kidney failure, unspecified: Secondary | ICD-10-CM | POA: Diagnosis not present

## 2014-12-31 DIAGNOSIS — I480 Paroxysmal atrial fibrillation: Secondary | ICD-10-CM | POA: Diagnosis not present

## 2014-12-31 DIAGNOSIS — K2950 Unspecified chronic gastritis without bleeding: Secondary | ICD-10-CM | POA: Diagnosis not present

## 2014-12-31 DIAGNOSIS — E785 Hyperlipidemia, unspecified: Secondary | ICD-10-CM | POA: Diagnosis not present

## 2014-12-31 DIAGNOSIS — R001 Bradycardia, unspecified: Secondary | ICD-10-CM

## 2014-12-31 DIAGNOSIS — K259 Gastric ulcer, unspecified as acute or chronic, without hemorrhage or perforation: Secondary | ICD-10-CM | POA: Diagnosis not present

## 2014-12-31 DIAGNOSIS — D500 Iron deficiency anemia secondary to blood loss (chronic): Secondary | ICD-10-CM | POA: Diagnosis not present

## 2014-12-31 DIAGNOSIS — D62 Acute posthemorrhagic anemia: Secondary | ICD-10-CM | POA: Diagnosis not present

## 2014-12-31 LAB — COMPREHENSIVE METABOLIC PANEL
ALT: 19 U/L (ref 0–53)
AST: 17 U/L (ref 0–37)
Albumin: 3.3 g/dL — ABNORMAL LOW (ref 3.5–5.2)
Alkaline Phosphatase: 27 U/L — ABNORMAL LOW (ref 39–117)
Anion gap: 6 (ref 5–15)
BUN: 18 mg/dL (ref 6–23)
CALCIUM: 8.6 mg/dL (ref 8.4–10.5)
CHLORIDE: 110 mmol/L (ref 96–112)
CO2: 26 mmol/L (ref 19–32)
Creatinine, Ser: 1.19 mg/dL (ref 0.50–1.35)
GFR, EST AFRICAN AMERICAN: 67 mL/min — AB (ref >90)
GFR, EST NON AFRICAN AMERICAN: 58 mL/min — AB (ref >90)
GLUCOSE: 103 mg/dL — AB (ref 70–99)
POTASSIUM: 3.9 mmol/L (ref 3.5–5.1)
SODIUM: 142 mmol/L (ref 135–145)
TOTAL PROTEIN: 5.3 g/dL — AB (ref 6.0–8.3)
Total Bilirubin: 0.5 mg/dL (ref 0.3–1.2)

## 2014-12-31 LAB — CBC
HCT: 26.7 % — ABNORMAL LOW (ref 39.0–52.0)
Hemoglobin: 8.2 g/dL — ABNORMAL LOW (ref 13.0–17.0)
MCH: 26.7 pg (ref 26.0–34.0)
MCHC: 30.7 g/dL (ref 30.0–36.0)
MCV: 87 fL (ref 78.0–100.0)
PLATELETS: 209 10*3/uL (ref 150–400)
RBC: 3.07 MIL/uL — AB (ref 4.22–5.81)
RDW: 17 % — AB (ref 11.5–15.5)
WBC: 3.8 10*3/uL — AB (ref 4.0–10.5)

## 2014-12-31 LAB — GLUCOSE, CAPILLARY
Glucose-Capillary: 104 mg/dL — ABNORMAL HIGH (ref 70–99)
Glucose-Capillary: 112 mg/dL — ABNORMAL HIGH (ref 70–99)
Glucose-Capillary: 89 mg/dL (ref 70–99)
Glucose-Capillary: 115 mg/dL — ABNORMAL HIGH (ref 70–99)

## 2014-12-31 LAB — PREPARE RBC (CROSSMATCH)

## 2014-12-31 MED ORDER — SODIUM CHLORIDE 0.9 % IV SOLN
Freq: Once | INTRAVENOUS | Status: AC
  Administered 2014-12-31: 16:00:00 via INTRAVENOUS

## 2014-12-31 MED ORDER — WARFARIN - PHARMACIST DOSING INPATIENT: Status: DC

## 2014-12-31 MED ORDER — OMEGA-3 FATTY ACIDS 1000 MG PO CAPS: 1.0000 g | ORAL_CAPSULE | Freq: Every day | ORAL | Status: DC

## 2014-12-31 MED ORDER — SODIUM CHLORIDE 0.9 % IV SOLN
INTRAVENOUS | Status: AC
  Administered 2014-12-31: 04:00:00 via INTRAVENOUS

## 2014-12-31 MED ORDER — WARFARIN - PHARMACIST DOSING INPATIENT: Freq: Every day | Status: DC

## 2014-12-31 MED ORDER — ACETAMINOPHEN 650 MG RE SUPP: 650.0000 mg | Freq: Four times a day (QID) | RECTAL | Status: DC | PRN

## 2014-12-31 MED ORDER — ACETAMINOPHEN 325 MG PO TABS: 650.0000 mg | ORAL_TABLET | Freq: Four times a day (QID) | ORAL | Status: DC | PRN

## 2014-12-31 MED ORDER — CINNAMON 500 MG PO CAPS: 1000.0000 mg | ORAL_CAPSULE | Freq: Every day | ORAL | Status: DC

## 2014-12-31 MED ORDER — BENAZEPRIL HCL 10 MG PO TABS
40.0000 mg | ORAL_TABLET | Freq: Two times a day (BID) | ORAL | Status: DC
  Administered 2014-12-31 – 2015-01-01 (×4): 40 mg via ORAL
  Filled 2014-12-31 (×4): qty 4

## 2014-12-31 MED ORDER — AMLODIPINE BESYLATE 5 MG PO TABS
10.0000 mg | ORAL_TABLET | Freq: Every day | ORAL | Status: DC
  Administered 2014-12-31: 10 mg via ORAL
  Filled 2014-12-31: qty 2

## 2014-12-31 MED ORDER — FENOFIBRATE 160 MG PO TABS
160.0000 mg | ORAL_TABLET | Freq: Every day | ORAL | Status: DC
  Administered 2014-12-31 – 2015-01-01 (×2): 160 mg via ORAL
  Filled 2014-12-31 (×2): qty 1

## 2014-12-31 MED ORDER — POTASSIUM CHLORIDE CRYS ER 10 MEQ PO TBCR
10.0000 meq | EXTENDED_RELEASE_TABLET | Freq: Two times a day (BID) | ORAL | Status: DC
  Administered 2014-12-31 – 2015-01-01 (×3): 10 meq via ORAL
  Filled 2014-12-31 (×3): qty 1

## 2014-12-31 MED ORDER — INSULIN ASPART 100 UNIT/ML ~~LOC~~ SOLN: 0.0000 [IU] | Freq: Three times a day (TID) | SUBCUTANEOUS | Status: DC

## 2014-12-31 MED ORDER — MECLIZINE HCL 12.5 MG PO TABS: 50.0000 mg | ORAL_TABLET | Freq: Two times a day (BID) | ORAL | Status: DC | PRN

## 2014-12-31 MED ORDER — FUROSEMIDE 20 MG PO TABS: 20.0000 mg | ORAL_TABLET | Freq: Two times a day (BID) | ORAL | Status: DC

## 2014-12-31 MED ORDER — FERROUS SULFATE 325 (65 FE) MG PO TABS
325.0000 mg | ORAL_TABLET | Freq: Every day | ORAL | Status: DC
  Administered 2014-12-31 – 2015-01-01 (×2): 325 mg via ORAL
  Filled 2014-12-31 (×2): qty 1

## 2014-12-31 MED ORDER — CHOLECALCIFEROL 10 MCG (400 UNIT) PO TABS
400.0000 [IU] | ORAL_TABLET | Freq: Every day | ORAL | Status: DC
  Administered 2014-12-31 – 2015-01-01 (×2): 400 [IU] via ORAL
  Filled 2014-12-31 (×2): qty 1

## 2014-12-31 MED ORDER — GABAPENTIN 400 MG PO CAPS
800.0000 mg | ORAL_CAPSULE | Freq: Two times a day (BID) | ORAL | Status: DC
  Administered 2014-12-31 – 2015-01-01 (×4): 800 mg via ORAL
  Filled 2014-12-31 (×6): qty 2

## 2014-12-31 MED ORDER — SODIUM CHLORIDE 0.9 % IJ SOLN
3.0000 mL | Freq: Two times a day (BID) | INTRAMUSCULAR | Status: DC
  Administered 2014-12-31 – 2015-01-01 (×3): 3 mL via INTRAVENOUS

## 2014-12-31 MED ORDER — OMEGA-3-ACID ETHYL ESTERS 1 G PO CAPS
1.0000 g | ORAL_CAPSULE | Freq: Every day | ORAL | Status: DC
  Administered 2014-12-31 – 2015-01-01 (×2): 1 g via ORAL
  Filled 2014-12-31 (×2): qty 1

## 2014-12-31 MED ORDER — INSULIN ASPART 100 UNIT/ML ~~LOC~~ SOLN: 0.0000 [IU] | Freq: Every day | SUBCUTANEOUS | Status: DC

## 2014-12-31 MED ORDER — WARFARIN SODIUM 5 MG PO TABS
5.0000 mg | ORAL_TABLET | Freq: Once | ORAL | Status: AC
Start: 2014-12-31 — End: 2014-12-31
  Administered 2014-12-31: 5 mg via ORAL
  Filled 2014-12-31: qty 1

## 2014-12-31 MED ORDER — ATORVASTATIN CALCIUM 40 MG PO TABS
40.0000 mg | ORAL_TABLET | Freq: Every day | ORAL | Status: DC
  Administered 2014-12-31 – 2015-01-01 (×2): 40 mg via ORAL
  Filled 2014-12-31 (×3): qty 1

## 2014-12-31 MED ORDER — VITAMINS A & D 5000-400 UNITS PO CAPS: 5000.0000 | ORAL_CAPSULE | Freq: Every day | ORAL | Status: DC

## 2014-12-31 MED ORDER — PANTOPRAZOLE SODIUM 40 MG PO TBEC
40.0000 mg | DELAYED_RELEASE_TABLET | Freq: Two times a day (BID) | ORAL | Status: DC
  Administered 2014-12-31 – 2015-01-01 (×4): 40 mg via ORAL
  Filled 2014-12-31 (×4): qty 1
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
## 2014-12-31 NOTE — Progress Notes (Signed)
[facility] PROGRESS NOTE  Keith Doyle JAS:[id number] DOB: 03-26-1936 DOA: 12/30/2014 PCP: [name], FNP  Assessment/Plan: Anemia: acute on chronic blood loss related to multiple gastric Cameron ulcers/erosions in setting of coumadin. Hg 8.3 down from 9.4 3 weeks ago. Trending down slightly since admission. Will transfuse 1 unit PRBC's.  Reports intermittent "dizzy spells" denies syncope or near syncope. Coumadin resumed on 4/10. Recent hospitalization colonoscopy without significant findings per GI. Evaluated by GI who are considering capsule study. Also recommending serial H&H with transfusions as needed if to continue coumadin. OP. He has appointment with Dr Percival Spanish to discuss in may. Cont protonix 40mg  po bid.  Renal insufficiency: likely related to daily lasix. Resolved with IV fluids. Lasix discontinued per cardiology. Monitor.   Pafib: not on rate control medicine. CHADS VASC score 4. Continuing coumadin. INR 1.98.   Bradycardia: HR range 44-60. Recent hospitalization with HR range 50's. Recent TSH within limits of normal. Await cardiology recommendations.    DVT : stable.Cont coumadin  Dm2: cbg 103. Continue SSI. Home oral agents being held for now  Code Status: full Family Communication: daughter at bedside Disposition Plan: home when ready hopefully 24-48 hours   Consultants:  Cardiology  gastroenterology  Procedures:  none  Antibiotics:  none  HPI/Subjective: Sitting up in bed. Reports feeling "good"  Objective: Filed Vitals:   12/31/14 0533  BP: 129/51  Pulse: 49  Temp: 97.8 F (36.6 C)  Resp: 15    Intake/Output Summary (Last 24 hours) at 12/31/14 1037 Last data filed at 12/31/14 0500  Gross per 24 hour  Intake  56.67 ml  Output    550 ml  Net -493.33 ml   Filed Weights   12/30/14 1732 12/31/14 0220  Weight: 98.884 kg (218 lb) 99.5 kg (219 lb 5.7 oz)    Exam:   General:  Well nourishes appears well  Cardiovascular:  irregularly irregular no MGR no LE edema  Respiratory: normal effort BS clear bilaterally no rhonchi no wheeze  Abdomen: non-distended non tender +BS  Musculoskeletal: no clubbing or cyanosis   Data Reviewed: Basic Metabolic Panel:  Recent Labs Lab 12/30/14 2130 12/31/14 0527  NA 138 142  K 4.0 3.9  CL 106 110  CO2 27 26  GLUCOSE 100* 103*  BUN 21 18  CREATININE 1.43* 1.19  CALCIUM 8.7 8.6   Liver Function Tests:  Recent Labs Lab 12/30/14 2130 12/31/14 0527  AST 20 17  ALT 22 19  ALKPHOS 29* 27*  BILITOT 0.4 0.5  PROT 5.8* 5.3*  ALBUMIN 3.7 3.3*   No results for input(s): LIPASE, AMYLASE in the last 168 hours. No results for input(s): AMMONIA in the last 168 hours. CBC:  Recent Labs Lab 12/25/14 1602 12/26/14 1416 12/30/14 1604 12/30/14 2130 12/31/14 0527  WBC 5.3 6.2 5.7 5.0 3.8*  NEUTROABS  --   --   --  2.3  --   HGB 9.2* 9.6* 8.3* 8.7* 8.2*  HCT 31.3* 31.3* 27.7* 27.8* 26.7*  MCV 82.8 81.5 83.3 87.1 87.0  PLT  --   --   --  243 209   Cardiac Enzymes:  Recent Labs Lab 12/30/14 2130  TROPONINI <0.03   BNP (last 3 results) No results for input(s): BNP in the last 8760 hours.  ProBNP (last 3 results) No results for input(s): PROBNP in the last 8760 hours.  CBG:  Recent Labs Lab 12/31/14 0228  GLUCAP 104*    No results found for this or any previous visit (  from the past 240 hour(s)).   Studies: No results found.  Scheduled Meds: . amLODipine  10 mg Oral Daily  . atorvastatin  40 mg Oral Daily  . benazepril  40 mg Oral BID  . cholecalciferol  400 Units Oral Daily  . fenofibrate  160 mg Oral Daily  . ferrous sulfate  325 mg Oral Q breakfast  . gabapentin  800 mg Oral BID  . insulin aspart  0-5 Units Subcutaneous QHS  . insulin aspart  0-9 Units Subcutaneous TID WC  . omega-3 acid ethyl esters  1 g Oral Daily  . pantoprazole  40 mg Oral BID AC  . potassium chloride SA  10 mEq Oral BID  . sodium chloride  3 mL Intravenous Q12H   . Warfarin - Pharmacist Dosing Inpatient   Does not apply q1800   Continuous Infusions:   Principal Problem:   Anemia due to chronic blood loss Active Problems:   Essential hypertension   Paroxysmal a-fib   Bradycardia   Renal insufficiency   Anemia    Time spent: 30 minutes    [facility] Pager [phone]. If 7PM-7AM, please contact night-coverage at [email or web address], password [facility] 12/31/2014, 10:37 AM
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
## 2014-12-31 NOTE — Progress Notes (Signed)
PHARMACIST - PHYSICIAN ORDER COMMUNICATION  CONCERNING: P&T Medication Policy on Herbal Medications  DESCRIPTION:  This patient's order for:  cinnamon  has been noted.  This product(s) is classified as an "herbal" or natural product. Due to a lack of definitive safety studies or FDA approval, nonstandard manufacturing practices, plus the potential risk of unknown drug-drug interactions while on inpatient medications, the Pharmacy and Therapeutics Committee does not permit the use of "herbal" or natural products of this type within [place].   ACTION TAKEN: The pharmacy department is unable to verify this order at this time and your patient has been informed of this safety policy. Please reevaluate patient's clinical condition at discharge and address if the herbal or natural product(s) should be resumed at that time.   

## 2014-12-31 NOTE — Progress Notes (Signed)
Patient seen and discussed with NP Lawerence, I agree with her documentation. 79 yo male hx of HTN, afib, hx of DVT admitted with symptomatic anemia. Admit earlier in the month as well with symptomatic anemia with Hgb 6.3 at that time, received 4 units pRBCs that admit. Hgb 9.4 at discharge, trended down to 8.3 this admit.    INR 2, Hgb 8.3, Plt 285, trop neg, K 4, Cr 1.43 (baseline 1) EKG sinus bradycardia 46 Stool heme + 12/2014 EGD multiple ulcers.  12/2014 colonscopy: 2 colon polyps removed  Patient admitted with symptomatic anemia, second admit this month. He has been on coumadin for hx of afib and CHAD2Vasc score of 5 (age, HTN, prior CVA by imaging). We discussed risks vs benefits of anticoagulation in the setting of recurrent anemia, he is in favor of continuing coumadin at this time if at all possible. EKG with sinus bradycardia in high 40s, bp's have been normal. He is not on any AV nodal agents at home. Reports some dizziness at times with standing. Orthostatics 12/16/14 show he was orthostatic by pulse  (HR 51 lying to 77 standing). He presented this admit with AKI likely prerenal, I suspect his lasix at home may be too much, especially if he is losing volume with slow bleed. Suspect dizziness more related to hypovolemia than bradycardia at this time. Will hold lasix, continue IVF and follow symptoms. Will ask nursing staff to ambulate patient and document heart rate response. Patient reports he tolerates high levels of physical activity fairly well, including fairly high energy line dancing. Some case reported bradycardia on norvasc, will stop. Consider hydralazine if additional bp control needed. TSH normal.    Zandra Abts MD

## 2015-01-01 ENCOUNTER — Encounter (HOSPITAL_COMMUNITY)
Admission: EM | Disposition: A | Discharge status: Still In Hospital | Payer: Self-pay | Source: Home / Self Care | Attending: Emergency Medicine

## 2015-01-01 DIAGNOSIS — Z7901 Long term (current) use of anticoagulants: Secondary | ICD-10-CM | POA: Diagnosis not present

## 2015-01-01 DIAGNOSIS — D500 Iron deficiency anemia secondary to blood loss (chronic): Secondary | ICD-10-CM

## 2015-01-01 DIAGNOSIS — N179 Acute kidney failure, unspecified: Secondary | ICD-10-CM | POA: Diagnosis not present

## 2015-01-01 DIAGNOSIS — E785 Hyperlipidemia, unspecified: Secondary | ICD-10-CM | POA: Diagnosis not present

## 2015-01-01 DIAGNOSIS — D62 Acute posthemorrhagic anemia: Secondary | ICD-10-CM | POA: Diagnosis not present

## 2015-01-01 DIAGNOSIS — I480 Paroxysmal atrial fibrillation: Secondary | ICD-10-CM | POA: Diagnosis not present

## 2015-01-01 DIAGNOSIS — K259 Gastric ulcer, unspecified as acute or chronic, without hemorrhage or perforation: Secondary | ICD-10-CM | POA: Diagnosis not present

## 2015-01-01 DIAGNOSIS — K2950 Unspecified chronic gastritis without bleeding: Secondary | ICD-10-CM | POA: Diagnosis not present

## 2015-01-01 DIAGNOSIS — R001 Bradycardia, unspecified: Secondary | ICD-10-CM | POA: Diagnosis not present

## 2015-01-01 DIAGNOSIS — I10 Essential (primary) hypertension: Secondary | ICD-10-CM | POA: Diagnosis not present

## 2015-01-01 HISTORY — PX: GIVENS CAPSULE STUDY: SHX5432

## 2015-01-01 LAB — CBC
HEMATOCRIT: 29.7 % — AB (ref 39.0–52.0)
Hemoglobin: 9.3 g/dL — ABNORMAL LOW (ref 13.0–17.0)
MCH: 27.4 pg (ref 26.0–34.0)
MCHC: 31.3 g/dL (ref 30.0–36.0)
MCV: 87.4 fL (ref 78.0–100.0)
PLATELETS: 209 10*3/uL (ref 150–400)
RBC: 3.4 MIL/uL — ABNORMAL LOW (ref 4.22–5.81)
RDW: 16.9 % — ABNORMAL HIGH (ref 11.5–15.5)
WBC: 4 10*3/uL (ref 4.0–10.5)

## 2015-01-01 LAB — GLUCOSE, CAPILLARY
GLUCOSE-CAPILLARY: 113 mg/dL — AB (ref 70–99)
Glucose-Capillary: 101 mg/dL — ABNORMAL HIGH (ref 70–99)
Glucose-Capillary: 84 mg/dL (ref 70–99)

## 2015-01-01 LAB — TYPE AND SCREEN
ABO/RH(D): A POS
Antibody Screen: NEGATIVE
Unit division: 0

## 2015-01-01 LAB — HEMOGLOBIN A1C
Hgb A1c MFr Bld: 6.1 % — ABNORMAL HIGH (ref 4.8–5.6)
Mean Plasma Glucose: 128 mg/dL

## 2015-01-01 LAB — PROTIME-INR
INR: 1.79 — AB (ref 0.00–1.49)
Prothrombin Time: 21 seconds — ABNORMAL HIGH (ref 11.6–15.2)

## 2015-01-01 SURGERY — IMAGING PROCEDURE, GI TRACT, INTRALUMINAL, VIA CAPSULE

## 2015-01-01 MED ORDER — POTASSIUM CHLORIDE CRYS ER 20 MEQ PO TBCR: 10.0000 meq | EXTENDED_RELEASE_TABLET | Freq: Two times a day (BID) | ORAL | Status: DC | PRN

## 2015-01-01 MED ORDER — WARFARIN SODIUM 5 MG PO TABS: 2.5000 mg | ORAL_TABLET | Freq: Every day | ORAL | Status: DC

## 2015-01-01 MED ORDER — POTASSIUM CHLORIDE CRYS ER 20 MEQ PO TBCR: 10.0000 meq | EXTENDED_RELEASE_TABLET | Freq: Two times a day (BID) | ORAL | Status: DC

## 2015-01-01 MED ORDER — WARFARIN SODIUM 5 MG PO TABS
5.0000 mg | ORAL_TABLET | Freq: Once | ORAL | Status: AC
  Administered 2015-01-01: 5 mg via ORAL
  Filled 2015-01-01: qty 1

## 2015-01-01 MED ORDER — FUROSEMIDE 20 MG PO TABS: 20.0000 mg | ORAL_TABLET | Freq: Two times a day (BID) | ORAL | Status: DC | PRN

## 2015-01-01 MED ORDER — SODIUM CHLORIDE 0.9 % IV SOLN
INTRAVENOUS | Status: DC
  Administered 2015-01-01: 13:00:00 via INTRAVENOUS

## 2015-01-01 MED ORDER — PANTOPRAZOLE SODIUM 40 MG PO TBEC: 40.0000 mg | DELAYED_RELEASE_TABLET | Freq: Two times a day (BID) | ORAL | Status: DC

## 2015-01-01 NOTE — Progress Notes (Signed)
Pt's IV catheter removed and intact. Pt's IV site clean dry and intact. Discharge instructions reviewed and discussed with patient. All follow up appointments were reviewed and discussed with patient. Medications were reviewed and discussed with patient. All questions were answered and no further questions at this time. Pt escorted by nurse tech. Pt in stable condition and no acute distress at time of discharge.

## 2015-01-01 NOTE — Care Management Note (Signed)
    Page 1 of 1   01/01/2015     2:32:37 PM CARE MANAGEMENT NOTE 01/01/2015  Patient:  Keith Doyle, Keith Doyle   Account Number:  [id number]  Date Initiated:  01/01/2015  Documentation initiated by:  Jolene Provost  Subjective/Objective Assessment:   Pt is from home, lives alone but has strong support from family. Pt has cane for PRN use. Pt discharging home today. No CM needs.     Action/Plan:   Anticipated DC Date:  01/01/2015   Anticipated DC Plan:  [place]  CM consult      Choice offered to / List presented to:             Status of service:  Completed, signed off Medicare Important Message given?   (If response is "NO", the following Medicare IM given date fields will be blank) Date Medicare IM given:   Medicare IM given by:   Date Additional Medicare IM given:   Additional Medicare IM given by:    Discharge Disposition:  HOME/SELF CARE  Per UR Regulation:    If discussed at Long Length of Stay Meetings, dates discussed:    Comments:  01/01/2015 [place], RN, MSN, CM

## 2015-01-01 NOTE — Discharge Summary (Signed)
Physician Discharge Summary  Keith Doyle UUV:[id number] DOB: [birth year] DOA: 12/30/2014  PCP: [name], FNP  Admit date: 12/30/2014 Discharge date: 01/01/2015  Time spent: 40 minutes  Recommendations for Outpatient Follow-up:  1. Follow up with gastroenterology 01/13/15. Recommend cbc to track hg 2.  Follow up with cardiology 01/29/15 as scheduled to discuss risk/benefit of coumadin  Discharge Diagnoses:  Principal Problem:   Anemia due to chronic blood loss Active Problems:   Essential hypertension   Paroxysmal a-fib   Bradycardia   Renal insufficiency   Anemia   Discharge Condition: stable  Diet recommendation: heart healthy  Filed Weights   12/30/14 1732 12/31/14 0220 01/01/15 0742  Weight: 98.884 kg (218 lb) 99.5 kg (219 lb 5.7 oz) 99.338 kg (219 lb)    History of present illness:  79 yo male with htn, Pafib (Chads2=4), DVT, cameron ulcers, prsented to PCP on 12/30/14 with c/o lightheadedness and pt was noted to have anemia Hgb 8.3 and sent to ER for evaluation, Pt was noted to have heme positive stool and Hgb 8.7.    Hospital Course:  Anemia: acute on chronic blood loss related to multiple gastric Cameron ulcers/erosions in setting of coumadin. Hg 8.3 down from 9.4 3 weeks prior. S/p 1 unit PRBC's. Hg 9.3 at discharge.  Coumadin resumed on 4/10. Recent hospitalization colonoscopy without significant findings per GI. Evaluated by GI who recommended capsule study that was done on 01/01/15. Also recommending serial H&H with transfusions as needed if to continue coumadin. OP. He has appointment with Dr Percival Spanish to discuss in may. Cont protonix 40mg  po bid.  Renal insufficiency: likely related to daily lasix and dehydration.  Resolved with IV fluids. Lasix changed to prn per cardiology. .   Pafib: not on rate control medicine. CHADS VASC score 4. Continuing coumadin. INR 1.98. 1.79 at discharge. Follow up with coumadin clinic 01/02/15  Bradycardia: HR range 44-60. Recent  hospitalization with HR range 50's. Recent TSH within limits of normal. Evaluated by cardiology who recommend stopping norvasc and using hydralazine if needed and no indication for pacemaker at this time and noted OP EP evaluation could be considered. HR range 45-58.  DVT : Cont coumadin  Dm2: cbg stable. A1c 6.3 12/05/14.   Procedures:  Capsule study 01/01/15  Consultations:  gastroenterology  cardiology  Discharge Exam: Filed Vitals:   01/01/15 1348  BP: 136/58  Pulse: 47  Temp: 98.7 F (37.1 C)  Resp: 16    General: well nourished appears calm and comfortable Cardiovascular: RRR no MGR No LE edema Respiratory: normal effort BS clear bilaterally no wheeze Abdomen: non-distended non-tender +BS   Discharge Instructions    Current Discharge Medication List    CONTINUE these medications which have CHANGED   Details  furosemide (LASIX) 20 MG tablet Take 1 tablet (20 mg total) by mouth 2 (two) times daily as needed for fluid or edema. Qty: 30 tablet, Refills: 1    pantoprazole (PROTONIX) 40 MG tablet Take 1 tablet (40 mg total) by mouth 2 (two) times daily before a meal. Qty: 60 tablet, Refills: 1    potassium chloride SA (K-DUR,KLOR-CON) 20 MEQ tablet Take 0.5 tablets (10 mEq total) by mouth 2 (two) times daily as needed. When you take lasix. Qty: 30 tablet, Refills: 0      CONTINUE these medications which have NOT CHANGED   Details  atorvastatin (LIPITOR) 40 MG tablet Take 1 tablet (40 mg total) by mouth daily.    benazepril (LOTENSIN) 40 MG tablet  Take 1 tablet (40 mg total) by mouth 2 (two) times daily.    Cholecalciferol (VITAMIN D3) 5000 UNITS CAPS Take 1 capsule by mouth daily.    Cinnamon 500 MG capsule Take 1,000 mg by mouth daily.    diclofenac sodium (VOLTAREN) 1 % GEL Apply 2 g topically 3 (three) times daily. Qty: 1 Tube, Refills: 1    fenofibrate 160 MG tablet Take 1 tablet (160 mg total) by mouth daily.    ferrous sulfate 325 (65 FE) MG tablet  Take 325 mg by mouth daily with breakfast.     fish oil-omega-3 fatty acids 1000 MG capsule Take 1 g by mouth daily.     gabapentin (NEURONTIN) 400 MG capsule Take 2 capsules (800 mg total) by mouth 2 (two) times daily.    glucose blood test strip Use to check blood glucose once daily.  Dx:  Type 2 diabetes E11.9 Qty: 100 each, Refills: 3    meclizine (ANTIVERT) 50 MG tablet Take 1 tablet (50 mg total) by mouth 2 (two) times daily as needed. Qty: 60 tablet, Refills: 11   Associated Diagnoses: BPPV (benign paroxysmal positional vertigo), unspecified laterality    metFORMIN (GLUCOPHAGE) 500 MG tablet Take 1 tablet (500 mg total) by mouth 2 (two) times daily with a meal.    Vitamins A & D 5000-400 UNITS CAPS Take 5,000 tablets by mouth daily.    warfarin (COUMADIN) 5 MG tablet Take 1 tablet (5 mg total) by mouth daily. Hold until 12/15/14 at which time you may resume.      STOP taking these medications     amLODipine (NORVASC) 10 MG tablet        No Known Allergies    The results of significant diagnostics from this hospitalization (including imaging, microbiology, ancillary and laboratory) are listed below for reference.    Significant Diagnostic Studies: Dg Lumbar Spine Complete  12/05/2014   CLINICAL DATA:  Weakness and falls.  Back pain.  Initial encounter.  EXAM: LUMBAR SPINE - COMPLETE 4+ VIEW  COMPARISON:  Abdominal CT 09/01/2010  FINDINGS: Mild wedging of the T12 and L1 vertebral bodies is stable from previous, likely physiologic remodeling. There is no convincing fracture. No subluxation. No notable disc narrowing.  No focal bone lesion or endplate erosion. There is chronic tilting of the IVC filter with the apex to the right.  IMPRESSION: No acute osseous findings.   Electronically Signed   By: Monte Fantasia M.D.   On: 12/05/2014 20:13    Microbiology: No results found for this or any previous visit (from the past 240 hour(s)).   Labs: Basic Metabolic Panel:  Recent  Labs Lab 12/30/14 2130 12/31/14 0527  NA 138 142  K 4.0 3.9  CL 106 110  CO2 27 26  GLUCOSE 100* 103*  BUN 21 18  CREATININE 1.43* 1.19  CALCIUM 8.7 8.6   Liver Function Tests:  Recent Labs Lab 12/30/14 2130 12/31/14 0527  AST 20 17  ALT 22 19  ALKPHOS 29* 27*  BILITOT 0.4 0.5  PROT 5.8* 5.3*  ALBUMIN 3.7 3.3*   No results for input(s): LIPASE, AMYLASE in the last 168 hours. No results for input(s): AMMONIA in the last 168 hours. CBC:  Recent Labs Lab 12/26/14 1416 12/30/14 1604 12/30/14 2130 12/31/14 0527 01/01/15 0534  WBC 6.2 5.7 5.0 3.8* 4.0  NEUTROABS  --   --  2.3  --   --   HGB 9.6* 8.3* 8.7* 8.2* 9.3*  HCT 31.3* 27.7*  27.8* 26.7* 29.7*  MCV 81.5 83.3 87.1 87.0 87.4  PLT  --   --  243 209 209   Cardiac Enzymes:  Recent Labs Lab 12/30/14 2130  TROPONINI <0.03   BNP: BNP (last 3 results) No results for input(s): BNP in the last 8760 hours.  ProBNP (last 3 results) No results for input(s): PROBNP in the last 8760 hours.  CBG:  Recent Labs Lab 12/31/14 1136 12/31/14 1630 12/31/14 2046 01/01/15 0821 01/01/15 1144  GLUCAP 112* 89 115* 113* 101*       Signed:  Vilas Edgerly M  [facility] 01/01/2015, 2:28 PM
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
## 2015-01-01 NOTE — Progress Notes (Signed)
Subjective:  Patient is undergoing capsule endoscopy. Started at 7:45. No complaints this morning. No BM in two days. No abdominal pain.   Objective: Vital signs in last 24 hours: Temp:  [97.8 F (36.6 C)-98.7 F (37.1 C)] 98.2 F (36.8 C) (04/27 0459) Pulse Rate:  [45-71] 58 (04/27 0459) Resp:  [16-17] 16 (04/27 0459) BP: (117-139)/(59-75) 128/66 mmHg (04/27 0459) SpO2:  [95 %-99 %] 95 % (04/27 0459) Last BM Date: 12/30/14 General:   Alert,  Well-developed, well-nourished, pleasant and cooperative in NAD Head:  Normocephalic and atraumatic. Eyes:  Sclera clear, no icterus.  Abdomen:  Soft, data equipment in place.  Extremities:  Without clubbing, deformity or edema. Neurologic:  Alert and  oriented x4;  grossly normal neurologically. Skin:  Intact without significant lesions or rashes. Psych:  Alert and cooperative. Normal mood and affect.  Intake/Output from previous day: 04/26 0701 - 04/27 0700 In: 1035 [P.O.:720; I.V.:3; Blood:312] Out: 650 [Urine:650] Intake/Output this shift:    Lab Results: CBC  Recent Labs  12/30/14 2130 12/31/14 0527 01/01/15 0534  WBC 5.0 3.8* 4.0  HGB 8.7* 8.2* 9.3*  HCT 27.8* 26.7* 29.7*  MCV 87.1 87.0 87.4  PLT 243 209 209   BMET  Recent Labs  12/30/14 2130 12/31/14 0527  NA 138 142  K 4.0 3.9  CL 106 110  CO2 27 26  GLUCOSE 100* 103*  BUN 21 18  CREATININE 1.43* 1.19  CALCIUM 8.7 8.6   LFTs  Recent Labs  12/30/14 2130 12/31/14 0527  BILITOT 0.4 0.5  ALKPHOS 29* 27*  AST 20 17  ALT 22 19  PROT 5.8* 5.3*  ALBUMIN 3.7 3.3*   No results for input(s): LIPASE in the last 72 hours. PT/INR  Recent Labs  12/30/14 1557 12/30/14 2130 01/01/15 0534  LABPROT  --  22.7* 21.0*  INR 2.0 1.98* 1.79*      Imaging Studies: Dg Lumbar Spine Complete  12/05/2014   CLINICAL DATA:  Weakness and falls.  Back pain.  Initial encounter.  EXAM: LUMBAR SPINE - COMPLETE 4+ VIEW  COMPARISON:  Abdominal CT 09/01/2010  FINDINGS: Mild  wedging of the T12 and L1 vertebral bodies is stable from previous, likely physiologic remodeling. There is no convincing fracture. No subluxation. No notable disc narrowing.  No focal bone lesion or endplate erosion. There is chronic tilting of the IVC filter with the apex to the right.  IMPRESSION: No acute osseous findings.   Electronically Signed   By: Monte Fantasia M.D.   On: 12/05/2014 20:13  [2 weeks]   Assessment: 79 year old male with anemia secondary to chronic blood loss related to multiple large gastric Cameron ulcers/erosions in setting of chronic anticoagulation on Coumadin. Hgb slightly lower this admission than discharge several weeks ago (from 9.4 to 8.3). Although he is heme positive, he has no overt signs of GI bleeding. Colonoscopy and EGD both up-to-date, performed during last admission a few weeks ago. Appointment upcoming with Dr. Percival Spanish as outpatient to discuss risks/benefits of continuing Coumadin in this scenario.   Although [name] ulcers/erosions are likely culprit for IDA, could consider capsule study to wrap up GI evaluation and exclude any occult small bowel etiology. In the presence of Coumadin, unable to exclude any concomitant small bowel lesions that could be contributing, although this is less likely. As of note, he denies any other agents such as NSAIDs or aspirin powders.  He will need close monitoring if remaining on Coumadin, with transfusion as needed due to known [employer]  ulcers/erosions   Plan: 1. Small bowel capsule endoscopy today.  2. Continue PPI BID. 3. Keep upcoming outpatient appointment with Dr. Percival Spanish to discuss coumadin. 4. Discussed with Dyanne Carrel, NP. Patient will complete capsule endoscopy at 4pm and get be discharged thereafter.   Laureen Ochs. Bernarda Caffey [facility] [phone] 4/27/20169:01 AM

## 2015-01-01 NOTE — Progress Notes (Signed)
Subjective:    No complaints  Objective:   Temp:  [97.8 F (36.6 C)-98.7 F (37.1 C)] 98.2 F (36.8 C) (04/27 0459) Pulse Rate:  [45-71] 58 (04/27 0459) Resp:  [16-17] 16 (04/27 0459) BP: (117-139)/(59-75) 128/66 mmHg (04/27 0459) SpO2:  [95 %-99 %] 95 % (04/27 0459) Weight:  [219 lb (99.338 kg)] 219 lb (99.338 kg) (04/27 0742) Last BM Date: 12/30/14  Filed Weights   12/30/14 1732 12/31/14 0220 01/01/15 0742  Weight: 218 lb (98.884 kg) 219 lb 5.7 oz (99.5 kg) 219 lb (99.338 kg)    Intake/Output Summary (Last 24 hours) at 01/01/15 0836 Last data filed at 01/01/15 0300  Gross per 24 hour  Intake   1035 ml  Output    650 ml  Net    385 ml    Telemetry: Sinus brady 40-50s  Exam:  General: NAD  Resp: CTAB  Cardiac: regular, rate 50, no m/r/g  IR:CVELFYB soft, NT, ND  MSK: no LE edema  Neuro: no focal deficits  Psych: appropriate affect  Lab Results:  Basic Metabolic Panel:  Recent Labs Lab 12/30/14 2130 12/31/14 0527  NA 138 142  K 4.0 3.9  CL 106 110  CO2 27 26  GLUCOSE 100* 103*  BUN 21 18  CREATININE 1.43* 1.19  CALCIUM 8.7 8.6    Liver Function Tests:  Recent Labs Lab 12/30/14 2130 12/31/14 0527  AST 20 17  ALT 22 19  ALKPHOS 29* 27*  BILITOT 0.4 0.5  PROT 5.8* 5.3*  ALBUMIN 3.7 3.3*    CBC:  Recent Labs Lab 12/30/14 2130 12/31/14 0527 01/01/15 0534  WBC 5.0 3.8* 4.0  HGB 8.7* 8.2* 9.3*  HCT 27.8* 26.7* 29.7*  MCV 87.1 87.0 87.4  PLT 243 209 209    Cardiac Enzymes:  Recent Labs Lab 12/30/14 2130  TROPONINI <0.03    BNP: No results for input(s): PROBNP in the last 8760 hours.  Coagulation:  Recent Labs Lab 12/30/14 1557 12/30/14 2130 01/01/15 0534  INR 2.0 1.98* 1.79*    ECG:   Medications:   Scheduled Medications: . atorvastatin  40 mg Oral Daily  . benazepril  40 mg Oral BID  . cholecalciferol  400 Units Oral Daily  . fenofibrate  160 mg Oral Daily  . ferrous sulfate  325 mg Oral Q  breakfast  . gabapentin  800 mg Oral BID  . insulin aspart  0-5 Units Subcutaneous QHS  . insulin aspart  0-9 Units Subcutaneous TID WC  . omega-3 acid ethyl esters  1 g Oral Daily  . pantoprazole  40 mg Oral BID AC  . potassium chloride SA  10 mEq Oral BID  . sodium chloride  3 mL Intravenous Q12H  . Warfarin - Pharmacist Dosing Inpatient   Does not apply Q24H     Infusions: . sodium chloride       PRN Medications:  acetaminophen **OR** acetaminophen, meclizine     Assessment/Plan    1. Anemia - second admit this month. He has been on coumadin for hx of afib and CHAD2Vasc score of 5 (age, HTN, prior CVA by imaging) corresponding to 6.7% risk of CVA per year. We discussed risks vs benefits of anticoagulation in the setting of recurrent anemia in detail with he and his daughter, he is in favor of continuing coumadin at this time if at all possible and would like to readress with Dr Percival Spanish at follow up - for small pill endoscopy today  2.  Bradycardia - EKG with sinus bradycardia in high 40s, bp's have been normal. He is not on any AV nodal agents at home. Normal TSH 2 weeks ago -  Reports some dizziness at times with standing. Orthostatics 12/16/14 show he was orthostatic by pulse (HR 51 lying to 77 standing). He presented this admit with AKI likely prerenal, I suspect his lasix at home may be too much, especially if he is losing volume with slow bleed.  - Suspect dizziness more related to hypovolemia than bradycardia at this time. Improved since IVF yesterday - will have nursing ambulate patient and document chronotropic response, do not see indication for pacemaker at this time. Can consider outpatient EP evaluation.   3. AKI - Cr trending down with IVF. He is off his home lasix, would restart at discharge prn only. Previously orthostatic and admitted prerenal  4. HTN - stopped norvasc due to reported, though low frequency, of association with bradycardia - BP's remain at  goal. If needed would start hydralazine.      Carlyle Dolly, M.D.

## 2015-01-02 ENCOUNTER — Telehealth: Payer: Self-pay | Admitting: Pharmacist

## 2015-01-02 ENCOUNTER — Encounter (HOSPITAL_COMMUNITY): Payer: Self-pay | Admitting: Internal Medicine

## 2015-01-02 NOTE — Telephone Encounter (Signed)
appt rescheduled for Friday, April 29th

## 2015-01-03 ENCOUNTER — Ambulatory Visit (INDEPENDENT_AMBULATORY_CARE_PROVIDER_SITE_OTHER): Payer: Commercial Managed Care - HMO | Admitting: Pharmacist

## 2015-01-03 ENCOUNTER — Telehealth: Payer: Self-pay | Admitting: Gastroenterology

## 2015-01-03 VITALS — BP 138/68 | HR 56

## 2015-01-03 DIAGNOSIS — I480 Paroxysmal atrial fibrillation: Secondary | ICD-10-CM | POA: Diagnosis not present

## 2015-01-03 DIAGNOSIS — D649 Anemia, unspecified: Secondary | ICD-10-CM | POA: Diagnosis not present

## 2015-01-03 DIAGNOSIS — Z7901 Long term (current) use of anticoagulants: Secondary | ICD-10-CM | POA: Insufficient documentation

## 2015-01-03 LAB — POCT INR: INR: 2.4

## 2015-01-03 LAB — POCT HEMOGLOBIN: HEMOGLOBIN: 9.3 g/dL — AB (ref 14.1–18.1)

## 2015-01-03 NOTE — Progress Notes (Signed)
Patient recently hospitalized for low HBG.  Received 4 units of blood and had EDG performed.  Have not received results of EDG yet.   HBG was 9.3 which is stable compared to in hospital.  Discussed with PCP Evelina Dun, NP.  Patient to RTC next week for hospital follow up and to recheck HBG

## 2015-01-03 NOTE — Telephone Encounter (Signed)
Please let patient know that his small bowel capsule endoscopy showed a single small bowel erosion. Suspect his ongoing iron deficiency anemia related to gastritis/Cameron erosions/ulcerations within the stomach.  1. He should continue pantoprazole 40 mg twice daily for at least 3 months, then consider once daily. 2. He needs to continue to have frequent monitoring of his hemoglobin especially if he remains on Coumadin therapy. 3. Advise patient to keep his appointment with cardiology as scheduled. 4. Advise patient to keep his office visit with Dr. Oneida Alar on May 9. She may check his blood at that time if felt appropriate. 5. Continue iron.

## 2015-01-03 NOTE — Patient Instructions (Signed)
Anticoagulation Dose Instructions as of 01/03/2015      Dorene Grebe Tue Wed Thu Fri Sat   New Dose 5 mg 2.5 mg 5 mg 5 mg 5 mg 2.5 mg 5 mg   Alt Week 5 mg 2.5 mg 5 mg 5 mg 5 mg 2.5 mg 5 mg    Description        Continue warfarin dose of 1/2 tablet on mondays and fridays and 1 tablet all other days.

## 2015-01-03 NOTE — Op Note (Addendum)
Small Bowel [place] Capsule Study Procedure date:  01/01/2015  Referring Provider:  Garfield Cornea, MD  PCP:  Dr. [name], FNP  Indication for procedure:  Transfusion dependent iron deficiency anemia with occult GI bleeding in the setting of Coumadin for A. fib. History of DVT status post IVC placement remotely.   On 12/06/2014, EGD showed multiple large nonbleeding [name] erosions/linear ulcers (chronic gastritis), large hiatal hernia. Colonoscopy with 2 hyperplastic colon polyps removed, normal terminal ileum, internal and external hemorrhoids.  Patient data:  Wt: 219 pounds Ht: 72 inches Waist: Normal physique  Findings:  Patient swallowed capsule without any difficulty. Capsule remained in the stomach for approximately 4 hours before entering the small bowel. There was a lot of food debris which limited views of the stomach. Small bowel transit was rapid, 27 minutes. Single small bowel erosion noted at 4 hours 7 minutes and 45 seconds. Otherwise unremarkable study.  First Gastric image:  1 minute 25 seconds First Duodenal image: 3 hours 59 minutes 6 seconds First Ileo-Cecal Valve image: 4 hours 22 minutes 45 seconds First Cecal image: 4 hours 26 minutes 58 seconds Gastric Passage time: 3 hours 57 minutes  Small Bowel Passage time:  27 minutes   Summary & Recommendations: Suspect iron deficiency anemia related to [facility] erosions/linear ulcers previously seen on EGD on 12/06/2014. Limited views of the stomach during this study. Capsule passed through the small bowel in 27 minutes, very rapidly. Single small bowel erosion noted. Otherwise unremarkable study.  1. Patient has a follow-up appointment with cardiology on 01/29/2015 did discuss the risk and benefits of ongoing Coumadin therapy.  2. If he remains on Coumadin, he must be checked for progressive anemia on a regular basis and transfuse as needed.  3. Pantoprazole 40 mg twice daily at least for 3 months, then consider  once daily for gastritis/Cameron lesions.  4. Continue oral iron therapy. 5. Patient should keep follow-up appointment with Dr. Oneida Alar on May 9, can recheck CBC at that time.  Laureen Ochs. Bernarda Caffey [facility] [phone] 4/29/20162:00 PM

## 2015-01-06 NOTE — Telephone Encounter (Signed)
Spoke to pt and daughter and they are both aware of results.

## 2015-01-06 NOTE — Telephone Encounter (Signed)
Patient wants [place] to call him first with results but if we cannot get him then try his daughter, Oliver Pila [phone].

## 2015-01-10 ENCOUNTER — Other Ambulatory Visit: Payer: Self-pay | Admitting: *Deleted

## 2015-01-10 ENCOUNTER — Encounter: Payer: Self-pay | Admitting: Family

## 2015-01-10 ENCOUNTER — Ambulatory Visit (INDEPENDENT_AMBULATORY_CARE_PROVIDER_SITE_OTHER): Payer: Commercial Managed Care - HMO | Admitting: Family

## 2015-01-10 VITALS — BP 130/97 | HR 51 | Temp 97.6°F | Ht 72.0 in | Wt 217.2 lb

## 2015-01-10 DIAGNOSIS — K922 Gastrointestinal hemorrhage, unspecified: Secondary | ICD-10-CM

## 2015-01-10 DIAGNOSIS — D689 Coagulation defect, unspecified: Secondary | ICD-10-CM

## 2015-01-10 DIAGNOSIS — D649 Anemia, unspecified: Secondary | ICD-10-CM | POA: Diagnosis not present

## 2015-01-10 LAB — POCT HEMOGLOBIN: HEMOGLOBIN: 9.5 g/dL — AB (ref 14.1–18.1)

## 2015-01-10 LAB — POCT INR: INR: 2

## 2015-01-10 NOTE — Patient Instructions (Signed)
Anemia, Nonspecific Anemia is a condition in which the concentration of red blood cells or hemoglobin in the blood is below normal. Hemoglobin is a substance in red blood cells that carries oxygen to the tissues of the body. Anemia results in not enough oxygen reaching these tissues.  CAUSES  Common causes of anemia include:   Excessive bleeding. Bleeding may be internal or external. This includes excessive bleeding from periods (in women) or from the intestine.   Poor nutrition.   Chronic kidney, thyroid, and liver disease.  Bone marrow disorders that decrease red blood cell production.  Cancer and treatments for cancer.  HIV, AIDS, and their treatments.  Spleen problems that increase red blood cell destruction.  Blood disorders.  Excess destruction of red blood cells due to infection, medicines, and autoimmune disorders. SIGNS AND SYMPTOMS   Minor weakness.   Dizziness.   Headache.  Palpitations.   Shortness of breath, especially with exercise.   Paleness.  Cold sensitivity.  Indigestion.  Nausea.  Difficulty sleeping.  Difficulty concentrating. Symptoms may occur suddenly or they may develop slowly.  DIAGNOSIS  Additional blood tests are often needed. These help your health care provider determine the best treatment. Your health care provider will check your stool for blood and look for other causes of blood loss.  TREATMENT  Treatment varies depending on the cause of the anemia. Treatment can include:   Supplements of iron, vitamin B12, or folic acid.   Hormone medicines.   A blood transfusion. This may be needed if blood loss is severe.   Hospitalization. This may be needed if there is significant continual blood loss.   Dietary changes.  Spleen removal. HOME CARE INSTRUCTIONS Keep all follow-up appointments. It often takes many weeks to correct anemia, and having your health care provider check on your condition and your response to  treatment is very important. SEEK IMMEDIATE MEDICAL CARE IF:   You develop extreme weakness, shortness of breath, or chest pain.   You become dizzy or have trouble concentrating.  You develop heavy vaginal bleeding.   You develop a rash.   You have bloody or black, tarry stools.   You faint.   You vomit up blood.   You vomit repeatedly.   You have abdominal pain.  You have a fever or persistent symptoms for more than 2-3 days.   You have a fever and your symptoms suddenly get worse.   You are dehydrated.  MAKE SURE YOU:  Understand these instructions.  Will watch your condition.  Will get help right away if you are not doing well or get worse. Document Released: 09/30/2004 Document Revised: 04/25/2013 Document Reviewed: 02/16/2013 Exit[employer] Patient Information 2015 [employer]. This information is not intended to replace advice given to you by your health care provider. Make sure you discuss any questions you have with your health care provider.  

## 2015-01-10 NOTE — Patient Outreach (Signed)
01/10/15- Telephone call to patient (HIPAA verified) to follow up on last week of ending 30 day transition of care and pt states he was in hospital from 4/25-4/27/16 for anemia.  New transition of care begun today, pt has follow up appointments with Dr. Oneida Alar (GI) 01/13/15, Dr. Sabra Heck (primary) 01/15/15, Dr. Percival Spanish (cardiologist) 01/29/15 and to have bloodwork (hemoglobin) and PT/ INR checked today at lab.  Pt verbalizes understanding of all upcoming appointments.  RN CM reviewed medications and pt verbalizes he has medications and taking as prescribed, is getting pantoprazole filled.   RN CM reviewed foods high in iron, pt says he is trying to eat a more healthy diet.  [facility] CM Care Plan        Most Recent Value   Problem One    Care Plan Problem One  difficulty navigating health care system   Role Documenting the Problem One  Care Management Coordinator   [facility] CM Care Plan Problem One     Care Plan for Problem One  Active   Patient Has Long Term Goal?  Yes   THN Long Term Goal (31-90 days)  pt will be able to navigate health care system (MD appointments, insurance questions, reportable signs/ symptoms) within 90 days   THN Long Term Goal Start Date  12/12/14   Interventions for Problem One Long Term Goal  RN CM reviewed resources to call for questions regarding health status or any other health related need (MD, RN CM, 24 hour nurse line, [employer] nurse line)   Problem One Short Term Goals    Number of Short Term Goals for Problem One  One, Two   Short Term Goal #1    THN CM Short Term Goal #1 (0-30 days)  pt will keep all scheduled appointments- primary MD 12/16/14  within one week   [facility] CM Short Term Goal #1 Start Date  12/12/14   [facility] CM Short Term Goal #1 Met Date  12/20/14   Interventions for Short Term Goal #1  RN CM instructed pt to read discharge instructions and be mindful of appointment with primary care MD and importance of attending.   Short Term Goal #2    THN CM Short Term Goal #2 (0-30  days)  pt will verbalize all scheduled appointments within 30 days.   THN CM Short Term Goal #2 Start Date  12/12/14   Interventions for Short Term Goal #2  RN CM reviewed all upcoming appointments with pt, reviewed from discharge summary.   Short Term Goal #3    Short Term Goal #4    Short Tern Goal #5    Problem Two    Care Plan Problem Two  Knowledge deficit related to low sodium diet (for hypertension, CHF although controlled).   Role Documenting the Problem Two  Care Management Coordinator   [facility] CM Care Plan Problem Two    Care Plan for Problem Two  Active   Patient Has Long Term Goal Problem Two?  Yes   THN Long Term Goal (31-90) days  pt will make food choices that support low sodium diet within 90 days.   THN Long Term Goal Start Date  12/26/14   Problem Two Short Term Goals    Number of Short Term Goal for Problem Two  One   Short Term Goal #1    THN CM Short Term Goal #1 (0-30 days)  pt will make low sodium food choices and limit restaurant food within 30 days.   THN  CM Short Term Goal #1 Start Date  12/26/14   Interventions for Short Term Goal #2   RN CM reviewed foods high in sodium to avoid, ask pt to limit restaurant foods brought into his home within 30 days   Short Term Goal #2    Short Term Goal #3    Short Term Goal #4    Short Term Goal #5    Problem Three    Care Plan Problem Three  High risk for falls   Role Documenting the Problem Three  Care Management Coordinator   [facility] CM Care Plan Problem Three    Care Plan for Problem Three  Active   Patient Has Long Term Goal for Problem Three?  Yes   THN Long Term Goal (31-90) days  pt will have no falls within 90 days.   THN Long Term Goal Start Date  12/26/14   Interventions for Problem Three Long Term Goal  RN CM reviewed safety precautions and instructed pt to ask for assistance if dizzy, etc.   Problem Three Short Term Goals    Number of Short Term Goals for Problem Three  One   Short Term Goal #1    THN CM Short  Term Goal #1 (0-30 days)  pt will use proper safety techniques within 30 days aeb by no falls   THN CM Short Term Goal #1 Start Date  12/26/14   Interventions for Short Term Goal #1  RN CM reviewed being careful when leaving home, stepping down at curbs, climbing stairs.   Short Term Goal #2    Short Term Goal #3    Short Term Goal #4    Short Term Goal #5       PLAN Follow up with weekly transition of care calls, see pt for previously scheduled home visit for May.  [name] [facility], [place] Coordinator [phone]

## 2015-01-10 NOTE — Progress Notes (Signed)
   Subjective:    Patient ID: Keith Doyle, male    DOB: [birth year], 79 y.o.   MRN: [id number]  HPI Pt presents to the office today to recheck hgb. Pt has been  hospitalized twice in the last month for GI bleed. Pt was seen in the office last week and had a Hgb of 9.3. Today his Hgb is 9.5 and his INR is 2. PT states he is feeling a lot better and is even "dancing". Pt denies any headache, palpitations, SOB, weakness, dizziness or bleeding at this time.      Review of Systems  Constitutional: Negative.   HENT: Negative.   Respiratory: Negative.   Cardiovascular: Negative.   Gastrointestinal: Negative.   Endocrine: Negative.   Genitourinary: Negative.   Musculoskeletal: Negative.   Neurological: Negative.   Hematological: Negative.   Psychiatric/Behavioral: Negative.   All other systems reviewed and are negative.      Objective:   Physical Exam  Constitutional: He is oriented to person, place, and time. He appears well-developed and well-nourished. No distress.  HENT:  Head: Normocephalic.  Right Ear: External ear normal.  Left Ear: External ear normal.  Nose: Nose normal.  Mouth/Throat: Oropharynx is clear and moist.  Eyes: Pupils are equal, round, and reactive to light. Right eye exhibits no discharge. Left eye exhibits no discharge.  Neck: Normal range of motion. Neck supple. No thyromegaly present.  Cardiovascular: Normal rate, regular rhythm, normal heart sounds and intact distal pulses.   No murmur heard. Pulmonary/Chest: Effort normal and breath sounds normal. No respiratory distress. He has no wheezes.  Abdominal: Soft. Bowel sounds are normal. He exhibits no distension. There is no tenderness.  Musculoskeletal: Normal range of motion. He exhibits no edema or tenderness.  Neurological: He is alert and oriented to person, place, and time. He has normal reflexes. No cranial nerve deficit.  Skin: Skin is warm and dry. No rash noted. No erythema.  Psychiatric: He has a  normal mood and affect. His behavior is normal. Judgment and thought content normal.  Vitals reviewed.     BP 130/97 mmHg  Pulse 51  Temp(Src) 97.6 F (36.4 C) (Oral)  Ht 6' (1.829 m)  Wt 217 lb 3.2 oz (98.521 kg)  BMI 29.45 kg/m2     Assessment & Plan:  1. Anemia, unspecified anemia type - POCT hemoglobin  2. Coagulation disorder  3. Gastrointestinal hemorrhage, unspecified gastritis, unspecified gastrointestinal hemorrhage type  Report any bleeding Falls precaution discussed Keep follow up appts with Tammy to monitor INR  Evelina Dun, FNP

## 2015-01-13 ENCOUNTER — Other Ambulatory Visit: Payer: Self-pay

## 2015-01-13 ENCOUNTER — Encounter: Payer: Self-pay | Admitting: Gastroenterology

## 2015-01-13 ENCOUNTER — Ambulatory Visit (INDEPENDENT_AMBULATORY_CARE_PROVIDER_SITE_OTHER): Payer: Commercial Managed Care - HMO | Admitting: Gastroenterology

## 2015-01-13 VITALS — BP 126/74 | HR 59 | Temp 97.3°F | Ht 72.0 in | Wt 218.4 lb

## 2015-01-13 DIAGNOSIS — M545 Low back pain, unspecified: Secondary | ICD-10-CM

## 2015-01-13 DIAGNOSIS — M5440 Lumbago with sciatica, unspecified side: Secondary | ICD-10-CM

## 2015-01-13 DIAGNOSIS — D500 Iron deficiency anemia secondary to blood loss (chronic): Secondary | ICD-10-CM
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
## 2015-01-13 NOTE — Patient Instructions (Signed)
DRINK WATER TO KEEP YOUR URINE LIGHT YELLOW.  FOLLOW A HIGH FIBER/HIGH IRON DIET. SEE INFO BELOW ON AN IRON DIET.  ADD COLACE WITH STIMULANT LAXATIVE TWICE DAILY TO AVOID CONSTIPATION/STRAINING.  CONTINUE IRON ONCE DAILY.  TAKE PROTONIX ONCE DAILY.  SEE PHYSICAL THERAPY FOR BACK PAIN.  PLEASE CALL IF YOU HAVE RECTAL BLEEDING OR BLACK TARRY STOOLS.  FOLLOW UP IN 4 MOS.    High Iron Diet  Purpose Iron is a mineral essential for life. Found in red blood cells, iron's primary role is to carry oxygen from the lungs to the rest of the body. Without oxygen, the body's cells cannot function normally. If the body's iron stores become too low, an iron-deficiency anemia can occur. This is characterized by weakness, lethargy, muscle fatigue, and shortness of breath. In severe cases, a person's skin may become pale due to a lack of red blood cells in the body. In adults, iron deficiency is most commonly caused by chronic blood loss, such as with heavy menstruation or intestinal bleeding from peptic ulcers, cancer, or hemorrhoids. In children, iron deficiency is usually the result of an inadequate iron intake.  Nutrition Facts The recommended dietary allowance (RDA) for iron in healthy adults is 10 milligrams per day for men and 15 milligrams per day for premenopausal women. Premenopausal women's needs are higher than men's needs because women lose iron during menstruation. It is generally easier for men to get enough iron than it is for women. Because they are usually bigger, men have higher calorie needs and will most likely eat enough food to meet their iron requirements. Women, on the other hand, tend to eat less. This makes it more difficult for them to meet their iron needs. It is, therefore, particularly important for premenopausal women to eat foods high in iron. Pregnant women will need as much as 30 milligrams of iron per day. The main reason is because the unborn baby needs iron for  development. As a result, it will draw from the mother's iron stores. This can quickly deplete a woman of iron if she is not eating enough iron rich foods. The following table lists foods high in iron. In general, meat, fish, and poultry are excellent sources. Other sources of iron include beans, dried fruits, whole grains, fortified cereals, and enriched breads.   Special Considerations  1. Heme and nonhemd iron are two forms of iron in foods. Heme iron is found in meats, poultry, and fish. NonHeme iron is found in both plant and animal foods. Heme iron is more easily absorbed by the body than nonheme iron. However, heme iron can also promote the absorption of non-heme iron. Therefore, eating beef and beans, for example, is good for providing adequate absorption of both types of iron. 2. Vitamin C also promotes iron absorption. This is true for both heme and nonheme iron. It is, therefore, beneficial to consume citrus fruits or juices, which are high in vitamin C, with foods that contain iron. For example, a meal might include a lean sirloin steak (heme iron source), baked potato (nonheme iron source , broccoli (nonheme iroj source), and an orange (vitamin C source) for a good iron intake. 3. Phytic and tannic aids are two food components that, when consumed in large amounts, prevent the abrorption of iron. Phytic acid is found in rye bread and other foods made from whole grains. Phytic acid is also found in nonherbal teas. Tannic acid is found in commercial black and pekoe teas, coffee, cola drinks, chocolate,  and red wines. 4. Iron SupplementsThere are many different kinds of iron supplements. However, iron supplemants should only be taken when there is a true deficiency of iron and only under medical supervision. General multivitamins often have iron and other minerals added to them in moderate amounts. If otherwise healthy, this amount of iron is probably not harmful. If iron is to be avoided,  multivitamins containing iron should not be used.Please note that it is important to keep iron and multivita-in supplements safely away from a child's reach. If ingested, severe poisoning can occur.   Foods That Contain Iron  Food Serving Size (mg)  Bran flakes cereal 1 cup 24.0  Product 19 cereal 1 cup 24.0  Clams, steamed 3 oz 23.8  Total cereal 1 cup 18.0  Life cereal 1 cup 12.2  Raisin bran cereal 1 cup 9.3  Beef liver, braised 3 oz 5.8  Kix cereal 1 cup 5.4  Cheerios cereal 1 cup 3.6  Prune juice 1 cup 3.0  Potato, baked with skin 1 med 2.8  Sirloin steak, cooked 3 oz 2.8  Shrimp, cooked 3 oz 2.6  Navy beans, cooked 1/2 cup 2.3  Figs, dried 5 2.1  Lean ground beef, broiled 3 oz 2.1  Swiss chard, cooked 1/2 cup 2.0  Rice krispies cereal 1 cup 1.8  Kidney beans 1/2 cup 1.6  Oatmeal, cooked 1/2 cup 1.6  Spinach, raw 1 cup 1.5  Tuna, canned in water 3 oz 1.3  Green peas, conked 1/2 cup 1.2  Halibut, cooked 3 oz 0.9  Whole-wheat bread 1 slice 0.9  Apricot halves, dried 5 0.8  Raisins 1/4 cup 0.8  Broccoli, cooked 1/2 cup 0.6  Egg, boiled 1 large 0.6

## 2015-01-13 NOTE — Progress Notes (Signed)
ON RECALL LIST  °

## 2015-01-13 NOTE — Assessment & Plan Note (Signed)
NO BRBPR OR MELENA. Hb STABLE. CHRONIC ANEMIA DUE TO CAMERON'S ULCERS.  DRINK WATER TO KEEP YOUR URINE LIGHT YELLOW. FOLLOW A HIGH FIBER/HIGH IRON DIET. SEE INFO BELOW ON AN IRON DIET. ADD COLACE WITH STIMULANT LAXATIVE TWICE DAILY TO AVOID CONSTIPATION/STRAINING. CONTINUE IRON ONCE DAILY.  REDUCE PROTONIX TO ONCE DAILY. PLEASE CALL IF YOU HAVE RECTAL BLEEDING OR BLACK TARRY STOOLS.  FOLLOW UP IN 4 MOS.
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
## 2015-01-15 ENCOUNTER — Encounter: Payer: Self-pay | Admitting: Family Medicine

## 2015-01-15 ENCOUNTER — Ambulatory Visit (INDEPENDENT_AMBULATORY_CARE_PROVIDER_SITE_OTHER): Payer: Commercial Managed Care - HMO | Admitting: Family Medicine

## 2015-01-15 VITALS — BP 143/76 | HR 60 | Temp 98.0°F | Ht 72.0 in | Wt 222.0 lb

## 2015-01-15 DIAGNOSIS — E785 Hyperlipidemia, unspecified: Secondary | ICD-10-CM | POA: Diagnosis not present

## 2015-01-15 DIAGNOSIS — I480 Paroxysmal atrial fibrillation: Secondary | ICD-10-CM | POA: Diagnosis not present

## 2015-01-15 DIAGNOSIS — E1140 Type 2 diabetes mellitus with diabetic neuropathy, unspecified: Secondary | ICD-10-CM | POA: Diagnosis not present

## 2015-01-15 DIAGNOSIS — I10 Essential (primary) hypertension: Secondary | ICD-10-CM

## 2015-01-15 DIAGNOSIS — M170 Bilateral primary osteoarthritis of knee: Secondary | ICD-10-CM | POA: Diagnosis not present

## 2015-01-15 DIAGNOSIS — D500 Iron deficiency anemia secondary to blood loss (chronic): Secondary | ICD-10-CM | POA: Diagnosis not present

## 2015-01-15 LAB — POCT CBC
Granulocyte percent: 49.6 %G (ref 37–80)
HCT, POC: 35.2 % — AB (ref 43.5–53.7)
Hemoglobin: 10.7 g/dL — AB (ref 14.1–18.1)
LYMPH, POC: 1.9 (ref 0.6–3.4)
MCH: 25.7 pg — AB (ref 27–31.2)
MCHC: 30.5 g/dL — AB (ref 31.8–35.4)
MCV: 84.3 fL (ref 80–97)
MPV: 8.2 fL (ref 0–99.8)
PLATELET COUNT, POC: 213 10*3/uL (ref 142–424)
POC Granulocyte: 2.2 (ref 2–6.9)
POC LYMPH %: 42.6 % (ref 10–50)
RBC: 4.17 M/uL — AB (ref 4.69–6.13)
RDW, POC: 19.1 %
WBC: 4.4 10*3/uL — AB (ref 4.6–10.2)

## 2015-01-15 NOTE — Progress Notes (Signed)
Subjective:    Patient ID: Keith Doyle, male    DOB: Apr 13, 1936, 79 y.o.   MRN: [id number]  HPI 79 year old gentleman here to follow-up anemia secondary to blood loss secondary to gastric ulcers. He is on Coumadin apparently for atrial fibrillation although he has had some DVTs in the past but that was about 6 years ago according to his history. The issue of whether or not to continue Coumadin in view of blood loss should be addressed. He does take Protonix for the gastric ulcers. PT INRs are followed here.  Patient Active Problem List   Diagnosis Date Noted  . Chronic anticoagulation   . Renal insufficiency 12/30/2014  . Multiple gastric ulcers 12/07/2014  . Colon polyps 12/07/2014  . Hemorrhoids 12/07/2014  . Warfarin-induced coagulopathy 12/06/2014  . Bradycardia 12/06/2014  . Anemia due to chronic blood loss 12/05/2014  . AKI (acute kidney injury) 12/05/2014  . Lumbar back pain 12/05/2014  . Paroxysmal a-fib 12/05/2014  . Chronic diastolic congestive heart failure 12/05/2014  . HLD (hyperlipidemia) 12/05/2014  . Back pain   . Paroxysmal atrial fibrillation 10/03/2014  . Osteopenia of the elderly 10/03/2014  . Dizziness 06/20/2012  . Exertional dyspnea 06/20/2012  . Diabetes mellitus with neuropathy 06/20/2012  . Influenza A 08/28/2011  . Hypokalemia 08/28/2011  . ARF (acute renal failure) 08/28/2011  . BPH (benign prostatic hyperplasia) 11/28/2010  . DVT of lower extremity (deep venous thrombosis) 11/28/2010  . Osteopenia 11/28/2010  . ED (erectile dysfunction) 11/28/2010  . Peripheral neuropathy 11/28/2010  . Hyperlipidemia 11/28/2010  . OVERWEIGHT 01/02/2009  . CARDIOVASCULAR FUNCTION STUDY, ABNORMAL 01/02/2009  . OTH NONSPECIFIC ABNORM CV SYSTEM FUNCTION STUDY 01/02/2009  . Essential hypertension 01/01/2009   Outpatient Encounter Prescriptions as of 01/15/2015  Medication Sig  . atorvastatin (LIPITOR) 40 MG tablet Take 1 tablet (40 mg total) by mouth daily.  .  benazepril (LOTENSIN) 40 MG tablet Take 1 tablet (40 mg total) by mouth 2 (two) times daily.  . Cholecalciferol (VITAMIN D3) 5000 UNITS CAPS Take 1 capsule by mouth daily.  . Cinnamon 500 MG capsule Take 1,000 mg by mouth daily.  . diclofenac sodium (VOLTAREN) 1 % GEL Apply 2 g topically 3 (three) times daily.  . fenofibrate 160 MG tablet Take 1 tablet (160 mg total) by mouth daily.  . ferrous sulfate 325 (65 FE) MG tablet Take 325 mg by mouth daily with breakfast.   . fish oil-omega-3 fatty acids 1000 MG capsule Take 1 g by mouth daily.   . furosemide (LASIX) 20 MG tablet Take 1 tablet (20 mg total) by mouth 2 (two) times daily as needed for fluid or edema.  . gabapentin (NEURONTIN) 400 MG capsule Take 2 capsules (800 mg total) by mouth 2 (two) times daily.  Marland Kitchen glucose blood test strip Use to check blood glucose once daily.  Dx:  Type 2 diabetes E11.9  . meclizine (ANTIVERT) 50 MG tablet Take 1 tablet (50 mg total) by mouth 2 (two) times daily as needed. (Patient taking differently: Take 50 mg by mouth 2 (two) times daily as needed for dizziness. )  . metFORMIN (GLUCOPHAGE) 500 MG tablet Take 1 tablet (500 mg total) by mouth 2 (two) times daily with a meal.  . pantoprazole (PROTONIX) 40 MG tablet Take 1 tablet (40 mg total) by mouth 2 (two) times daily before a meal.  . potassium chloride SA (K-DUR,KLOR-CON) 20 MEQ tablet Take 0.5 tablets (10 mEq total) by mouth 2 (two) times daily as needed. When  you take lasix.  Marland Kitchen warfarin (COUMADIN) 5 MG tablet Take 0.5-1 tablets (2.5-5 mg total) by mouth daily. Take 1 tablet (5mg ) evry day except 1/2 tablet (2.5mg ) on Mon & Fri   No facility-administered encounter medications on file as of 01/15/2015.      Review of Systems  Constitutional: Negative.   HENT: Negative.   Respiratory: Negative.   Cardiovascular: Negative.   Gastrointestinal: Positive for constipation.  Genitourinary: Negative.   Neurological: Negative.        Objective:   Physical  Exam  Constitutional: He is oriented to person, place, and time. He appears well-developed and well-nourished.  Cardiovascular: Normal rate and regular rhythm.   Pulmonary/Chest: Effort normal.  Abdominal: Soft. There is no tenderness.  Neurological: He is alert and oriented to person, place, and time.     BP 143/76 mmHg  Pulse 60  Temp(Src) 98 F (36.7 C) (Oral)  Ht 6' (1.829 m)  Wt 222 lb (100.699 kg)  BMI 30.10 kg/m2  1. HLD (hyperlipidemia) Lipids were at goal when checked 5 months ago. He should continue on atorvastatin 40 mg  2. Type 2 diabetes mellitus with diabetic neuropathy Apparently A1c was checked 3 weeks ago and diabetic control is good with a level of 6.1. Neuropathy symptoms are controlled with gabapentin  3. Anemia due to chronic blood loss There is no history to suggest ongoing blood loss. Patient tolerates iron fairly well except for some constipation - POCT CBC  4. Essential hypertension Blood pressure is well controlled on current regimen of benazepril  5. Paroxysmal a-fib Auscultation today reveals what seems to be a sinus rhythm. His heart rhythm is regular. As noted above I would question need for Coumadin given blood loss anemia  [name] MD     Assessment & Plan:

## 2015-01-15 NOTE — Progress Notes (Signed)
CC'ED TO PCP 

## 2015-01-17 ENCOUNTER — Encounter: Payer: Self-pay | Admitting: *Deleted

## 2015-01-17 ENCOUNTER — Ambulatory Visit: Payer: Commercial Managed Care - HMO | Admitting: Family Medicine

## 2015-01-17 ENCOUNTER — Other Ambulatory Visit: Payer: Self-pay | Admitting: *Deleted

## 2015-01-17 NOTE — Patient Outreach (Signed)
01/16/15- Telephone call to Pamala Hurry (unsure of last name) [employer] nurse at [phone]  [place] [phone], for collaboration as she had been patient's telephonic nurse for 4 years and assisted him with claims/ denial process, making sure pt has all medications, assisting with refills, etc.  3-way call with [facility] claims and noted there is a bill for 786.00 denied and now claims being handled through [place] at [phone] instead of [place],  3-way call with pt and pt verbalizes he wants to continue talking with his nurse Pamala Hurry from [place] for assistance with claims and was reiterated to pt that he cannot call Pamala Hurry for this, he will need to call [facility] care management, pt agreeable and will have this claim for RN CM to see at home visit on 01/22/15.  Pt states that he has all medications and has no medication issues at present. Pt had protonix filled at local drug store in [place], note sent to primary MD office requesting they send a 90 day supply prescription to [facility] mail order for protonix (pantoprazole).

## 2015-01-20 ENCOUNTER — Other Ambulatory Visit: Payer: Self-pay | Admitting: Family

## 2015-01-20 MED ORDER — PANTOPRAZOLE SODIUM 40 MG PO TBEC: 40.0000 mg | DELAYED_RELEASE_TABLET | Freq: Every day | ORAL | Status: DC

## 2015-01-21 ENCOUNTER — Other Ambulatory Visit: Payer: Self-pay | Admitting: Family

## 2015-01-21 ENCOUNTER — Telehealth: Payer: Self-pay | Admitting: Family Medicine

## 2015-01-21 MED ORDER — PANTOPRAZOLE SODIUM 40 MG PO TBEC
40.0000 mg | DELAYED_RELEASE_TABLET | Freq: Every day | ORAL | Status: DC
Start: 2015-01-21 — End: 2015-05-07

## 2015-01-21 MED ORDER — PANTOPRAZOLE SODIUM 40 MG PO TBEC: 40.0000 mg | DELAYED_RELEASE_TABLET | Freq: Every day | ORAL | Status: DC

## 2015-01-21 NOTE — Telephone Encounter (Signed)
There is no referral in workqueue for Dr Case [facility] and  So we would not have gotten a Human referral because we did nt know  He went there

## 2015-01-22 ENCOUNTER — Other Ambulatory Visit: Payer: Self-pay | Admitting: *Deleted

## 2015-01-22 ENCOUNTER — Encounter: Payer: Self-pay | Admitting: *Deleted

## 2015-01-22 NOTE — Telephone Encounter (Signed)
We may have talked about his arm during the visit but it was not a focus and there are no notes. If he wants to see orthopedic and is fine with me

## 2015-01-22 NOTE — Patient Outreach (Signed)
[facility]) Care Management   01/22/2015  Keith Doyle 07-Feb-1936 [id number]  RAMON Doyle is an 79 y.o. male  Subjective: Routine home visit with pt, HIPAA verified, pt states " I need some help with denied claims"  Pt states he has very difficult time with this process and says " I get the run around, no one will help me"  Pt states he is getting a free wheel chair ramp built through "[employer]" as this will make ambulating easier not having to go up steps.  Pt reports has sore left elbow from falling outside few days ago, pt denies he had any injuries.  Pt does not feel he needs to use assistive device citing he still goes dancing and is busy with lots of things he loves to do.  Objective:   Filed Vitals:   01/22/15 1010  BP: 116/62  Pulse: 63  Resp: 20  SpO2: 98%   ROS  Physical Exam  Constitutional: He is oriented to person, place, and time. He appears well-developed and well-nourished.  HENT:  Head: Normocephalic.  Neck: Normal range of motion.  Cardiovascular: Normal rate.   Respiratory: Breath sounds normal.  GI: Soft. Bowel sounds are normal.  Musculoskeletal: Normal range of motion. He exhibits no edema.  Neurological: He is alert and oriented to person, place, and time.  Psychiatric: He has a normal mood and affect. His behavior is normal. Judgment and thought content normal.    Current Medications:   Current Outpatient Prescriptions  Medication Sig Dispense Refill  . atorvastatin (LIPITOR) 40 MG tablet Take 1 tablet (40 mg total) by mouth daily.    . benazepril (LOTENSIN) 40 MG tablet Take 1 tablet (40 mg total) by mouth 2 (two) times daily.    . Cholecalciferol (VITAMIN D3) 5000 UNITS CAPS Take 1 capsule by mouth daily.    . Cinnamon 500 MG capsule Take 1,000 mg by mouth daily.    . diclofenac sodium (VOLTAREN) 1 % GEL Apply 2 g topically 3 (three) times daily. 1 Tube 1  . fenofibrate 160 MG tablet Take 1 tablet (160 mg total) by mouth daily.     . ferrous sulfate 325 (65 FE) MG tablet Take 325 mg by mouth daily with breakfast.     . fish oil-omega-3 fatty acids 1000 MG capsule Take 1 g by mouth daily.     . furosemide (LASIX) 20 MG tablet Take 1 tablet (20 mg total) by mouth 2 (two) times daily as needed for fluid or edema. 30 tablet 1  . gabapentin (NEURONTIN) 400 MG capsule Take 2 capsules (800 mg total) by mouth 2 (two) times daily.    Marland Kitchen glucose blood test strip Use to check blood glucose once daily.  Dx:  Type 2 diabetes E11.9 100 each 3  . meclizine (ANTIVERT) 50 MG tablet Take 1 tablet (50 mg total) by mouth 2 (two) times daily as needed. (Patient taking differently: Take 50 mg by mouth 2 (two) times daily as needed for dizziness. ) 60 tablet 11  . metFORMIN (GLUCOPHAGE) 500 MG tablet Take 1 tablet (500 mg total) by mouth 2 (two) times daily with a meal.    . pantoprazole (PROTONIX) 40 MG tablet Take 1 tablet (40 mg total) by mouth daily. 90 tablet 3  . potassium chloride SA (K-DUR,KLOR-CON) 20 MEQ tablet Take 0.5 tablets (10 mEq total) by mouth 2 (two) times daily as needed. When you take lasix. 30 tablet 0  . warfarin (COUMADIN)  5 MG tablet Take 0.5-1 tablets (2.5-5 mg total) by mouth daily. Take 1 tablet (48m) evry day except 1/2 tablet (2.548m on Mon & Fri     No current facility-administered medications for this visit.    Functional Status:   In your present state of health, do you have any difficulty performing the following activities: 01/01/2015 12/31/2014  Hearing? - N  Vision? - N  Difficulty concentrating or making decisions? - N  Walking or climbing stairs? - Y  Dressing or bathing? - N  Doing errands, shopping? N -  Preparing Food and eating ? - -  Using the Toilet? - -  In the past six months, have you accidently leaked urine? - -  Do you have problems with loss of bowel control? - -  Managing your Medications? - -  Managing your Finances? - -  Housekeeping or managing your Housekeeping? - -     Fall/Depression Screening:    PHQ 2/9 Scores 12/26/2014 12/05/2014 10/03/2014 02/04/2014  PHQ - 2 Score 0 0 0 0    Assessment:  Telephone call to [employer]/ [employer], spoke with TeHelene Kelpegarding claim dated 11/28/14 for 715.00 denied as pt did not have prior approval, no referral on file, per TeHelene Kelppt/ RN CM manager needs to contact [employer] so they can start the appeal process on behalf of pt and Dr. Case can also file an appeal for pt,  Also per [place] 75.00 dated 11/20/14 for EMS, pt does not owe as this is something medicare does not reimburse and cannot be balance billed and [employer] needs to contact provider and let them know that this bill needs to be written off. [facility] [employer] [facility], reported the above and spoke with DoLu Duffelho states the EMS bill for 75.00 will need to be taken up [facility] says this is not issue of balance billing and states correct payor on this claim is [employer] for ambulance response. DoLu Duffeltates the 715.00 claim- [employer] does not do the "leg work", only if life threatening situation and Dr. Case needs to be notfied to start the appeal process, RN CM will complete this and due to length of visit/ phone calls today, will come back to pt home for 3 way call with [employer] and [employer] to assist pt. RN CM talked with pt about telephone for hearing impairment and pt reports he can hear on home phone with speaker turned all the way up, pt continues having difficulty understanding the process for denied claims, etc, which is understandable.   RN CM reviewed GI MD instructions for diet, eat meats, beans, need adequate iron in diet, gave examples of foods to choose. RN CM tested pt life alert since now have to dial area code and pt life alert does work.  [facility] Care [facility] Problem One        Patient Outreach from 01/22/2015 in [place]  difficulty navigating health care system   Care Plan for  Problem One  Active   THN Long Term Goal (31-90 days)  pt will be able to navigate health care system (MD appointments, insurance questions, reportable signs/ symptoms) within 90 days   THN Long Term Goal Start Date  12/12/14   Interventions for Problem One Long Term Goal  RN CM reviewed resources to call for questions regarding health status or any other health related need (MD, RN CM, 24 hour nurse line, [employer]  nurse line), RN CM called [employer] claims and [employer] on pt behalf, see note in assessment [pt needs reinforcement]   THN CM Short Term Goal #1 (0-30 days)  pt will keep all scheduled appointments- primary MD 12/16/14  within one week   [facility] CM Short Term Goal #1 Start Date  12/12/14   [facility] CM Short Term Goal #1 Met Date  12/20/14   THN CM Short Term Goal #2 (0-30 days)  pt will verbalize all scheduled appointments within 30 days.   THN CM Short Term Goal #2 Start Date  12/12/14   [facility] CM Short Term Goal #2 Met Date  01/22/15 [pt verbalizes all upcoming MD appointments]   Interventions for Short Term Goal #2  RN CM reviewed all upcoming appointments with pt,    THN CM Short Term Goal #3 (0-30 days)  pt will have appeal process started for [employer] claim within 30 days   THN CM Short Term Goal #3 Start Date  01/22/15   Interventions for Short Tern Goal #3  See assessment,  RN CM contacted [employer], [employer] about denied claim    [place] Problem Two        Patient Outreach from 01/22/2015 in [place] Problem Two  Knowledge deficit related to low sodium diet (for hypertension, CHF although controlled).   Care Plan for Problem Two  Active   Interventions for Problem Two Long Term Goal   Reviewed being mindful of sodium intake at each meal and reading labels   THN Long Term Goal (31-90) days  pt will make food choices that support low sodium diet within 90 days.   THN Long Term Goal Start Date  12/26/14   THN CM Short Term Goal #1 (0-30 days)  pt will make  low sodium food choices and limit restaurant food within 30 days.   THN CM Short Term Goal #1 Start Date  12/26/14   [facility] CM Short Term Goal #1 Met Date   01/22/15 [pt reports he is trying to be more mindful of sodium intake]   Interventions for Short Term Goal #2   RN CM reviewed sodium hidden in foods such as canned goods, cheese, buttermilk.    [place] Problem Three        Patient Outreach from 01/22/2015 in [place] Problem Three  High risk for falls   Care Plan for Problem Three  Active   THN Long Term Goal (31-90) days  pt will have no falls within 90 days.   THN Long Term Goal Start Date  12/26/14   Interventions for Problem Three Long Term Goal  RN CM reviewed safety precautions and instructed pt to ask for assistance if dizzy, etc.   THN CM Short Term Goal #1 (0-30 days)  pt will use proper safety techniques within 30 days aeb by no falls [goal restarted, pt states he lost balance and hit elbow]   THN CM Short Term Goal #1 Start Date  01/22/15   Interventions for Short Term Goal #1  RN CM reviewed being careful when leaving home, stepping down at curbs, climbing stairs.     Plan:  Contact pt to set up home visit for next week for 3 way call with [facility] and [employer]  follow up with home visit on 02/19/15   [name] [facility], [place] Coordinator [phone]

## 2015-01-23 ENCOUNTER — Other Ambulatory Visit: Payer: Self-pay

## 2015-01-24 ENCOUNTER — Encounter: Payer: Self-pay | Admitting: Family

## 2015-01-24 ENCOUNTER — Ambulatory Visit (INDEPENDENT_AMBULATORY_CARE_PROVIDER_SITE_OTHER): Payer: Commercial Managed Care - HMO | Admitting: Family

## 2015-01-24 VITALS — BP 141/76 | HR 62 | Temp 97.5°F | Ht 72.0 in | Wt 219.6 lb

## 2015-01-24 DIAGNOSIS — D6832 Hemorrhagic disorder due to extrinsic circulating anticoagulants: Secondary | ICD-10-CM

## 2015-01-24 DIAGNOSIS — K922 Gastrointestinal hemorrhage, unspecified: Secondary | ICD-10-CM

## 2015-01-24 DIAGNOSIS — D699 Hemorrhagic condition, unspecified: Secondary | ICD-10-CM | POA: Diagnosis not present

## 2015-01-24 DIAGNOSIS — T45515A Adverse effect of anticoagulants, initial encounter: Secondary | ICD-10-CM

## 2015-01-24 DIAGNOSIS — I82409 Acute embolism and thrombosis of unspecified deep veins of unspecified lower extremity: Secondary | ICD-10-CM

## 2015-01-24 DIAGNOSIS — I480 Paroxysmal atrial fibrillation: Secondary | ICD-10-CM | POA: Diagnosis not present

## 2015-01-24 LAB — POCT CBC
Granulocyte percent: 71.1 %G (ref 37–80)
HCT, POC: 34.8 % — AB (ref 43.5–53.7)
HEMOGLOBIN: 10.4 g/dL — AB (ref 14.1–18.1)
LYMPH, POC: 1.6 (ref 0.6–3.4)
MCH, POC: 25.5 pg — AB (ref 27–31.2)
MCHC: 29.9 g/dL — AB (ref 31.8–35.4)
MCV: 85.2 fL (ref 80–97)
MPV: 7.6 fL (ref 0–99.8)
POC GRANULOCYTE: 5.3 (ref 2–6.9)
POC LYMPH PERCENT: 21.4 %L (ref 10–50)
Platelet Count, POC: 296 10*3/uL (ref 142–424)
RBC: 4.08 M/uL — AB (ref 4.69–6.13)
RDW, POC: 19.5 %
WBC: 7.4 10*3/uL (ref 4.6–10.2)

## 2015-01-24 LAB — POCT INR: INR: 2.1

## 2015-01-24 NOTE — Patient Instructions (Signed)

## 2015-01-24 NOTE — Progress Notes (Signed)
   Subjective:    Patient ID: Keith Doyle, male    DOB: 08/22/1936, 79 y.o.   MRN: [id number]  HPI Pt presents to the office today to recheck hgb. Pt has been hospitalized several times in the last month for GI bleed. Pt was seen in the office  on 01/15/15 and had a Hgb of 10.7. Today his Hgb is 10.4  and his INR is 2.1.  PT states he is feeling a lot better and states he has not been dizzy. Pt states he is taking his "iron pill and eating a lot of meat and liver pudding".  Pt denies any headache, palpitations, SOB, weakness, dizziness or bleeding at this time.   Review of Systems  Constitutional: Negative.   HENT: Negative.   Respiratory: Negative.   Cardiovascular: Negative.   Gastrointestinal: Negative.   Endocrine: Negative.   Genitourinary: Negative.   Musculoskeletal: Negative.   Neurological: Negative.   Hematological: Negative.   Psychiatric/Behavioral: Negative.   All other systems reviewed and are negative.      Objective:   Physical Exam  Constitutional: He is oriented to person, place, and time. He appears well-developed and well-nourished. No distress.  HENT:  Head: Normocephalic.  Right Ear: External ear normal.  Left Ear: External ear normal.  Mouth/Throat: Oropharynx is clear and moist.  Eyes: Pupils are equal, round, and reactive to light. Right eye exhibits no discharge. Left eye exhibits no discharge.  Neck: Normal range of motion. Neck supple. No thyromegaly present.  Cardiovascular: Normal rate, regular rhythm, normal heart sounds and intact distal pulses.   No murmur heard. Pulmonary/Chest: Effort normal and breath sounds normal. No respiratory distress. He has no wheezes.  Abdominal: Soft. Bowel sounds are normal. He exhibits no distension. There is no tenderness.  Musculoskeletal: Normal range of motion. He exhibits no edema or tenderness.  Neurological: He is alert and oriented to person, place, and time. He has normal reflexes. No cranial nerve  deficit.  Skin: Skin is warm and dry. No rash noted. No erythema.  Psychiatric: He has a normal mood and affect. His behavior is normal. Judgment and thought content normal.  Vitals reviewed.     BP 141/76 mmHg  Pulse 62  Temp(Src) 97.5 F (36.4 C) (Oral)  Ht 6' (1.829 m)  Wt 219 lb 9.6 oz (99.61 kg)  BMI 29.78 kg/m2     Assessment & Plan:  1. DVT of lower extremity (deep venous thrombosis), unspecified laterality - POCT INR  2. Paroxysmal atrial fibrillation - POCT INR  3. Warfarin-induced coagulopathy - POCT INR  4. Gastrointestinal hemorrhage, unspecified gastritis, unspecified gastrointestinal hemorrhage type - POCT CBC;  Falls precaution discussed Iron enriched diet discussed To keep all chronic follow up appointment and follow up appts with GI -Pt to follow up with Tammy in 2 weeks RTO if pt has any bleeding or  new dizziness  Evelina Dun, FNP

## 2015-01-28 ENCOUNTER — Other Ambulatory Visit: Payer: Self-pay | Admitting: Family Medicine

## 2015-01-28 ENCOUNTER — Other Ambulatory Visit: Payer: Self-pay | Admitting: *Deleted

## 2015-01-28 MED ORDER — BENAZEPRIL HCL 40 MG PO TABS: 40.0000 mg | ORAL_TABLET | Freq: Two times a day (BID) | ORAL | Status: DC

## 2015-01-28 MED ORDER — DICLOFENAC SODIUM 1 % TD GEL: 2.0000 g | Freq: Three times a day (TID) | TRANSDERMAL | Status: DC

## 2015-01-28 MED ORDER — FENOFIBRATE 160 MG PO TABS: 160.0000 mg | ORAL_TABLET | Freq: Every day | ORAL | Status: DC

## 2015-01-28 MED ORDER — ATORVASTATIN CALCIUM 40 MG PO TABS: 40.0000 mg | ORAL_TABLET | Freq: Every day | ORAL | Status: DC

## 2015-01-28 NOTE — Telephone Encounter (Signed)
done

## 2015-01-28 NOTE — Patient Outreach (Signed)
01/27/15- RN CM spoke with Keith Doyle at Dr. Remo Lipps Case regarding filing an appeal for date of service 11/28/14, per Keith Doyle, they are aware of situation and that pt did not obtain referral before seeing Dr. Case (pt previous MD Dr. Melina Copa was exempt from this process per Keith Doyle and they did not know pt had switched to another provider)  Keith Doyle will fax appeal with supporting documentation to [facility] at [phone]. 01/28/15- RN CM called pt and informed him of the above and that [facility] assistant director has spoken with [employer] and [facility] is awaiting return phone call regarding pt claims, pt verbalizes understanding.

## 2015-01-29 ENCOUNTER — Encounter: Payer: Self-pay | Admitting: Cardiology

## 2015-01-29 ENCOUNTER — Ambulatory Visit (INDEPENDENT_AMBULATORY_CARE_PROVIDER_SITE_OTHER): Payer: Commercial Managed Care - HMO | Admitting: Cardiology

## 2015-01-29 VITALS — BP 130/70 | HR 60 | Ht 72.0 in | Wt 223.0 lb

## 2015-01-29 DIAGNOSIS — I5032 Chronic diastolic (congestive) heart failure: Secondary | ICD-10-CM

## 2015-01-29 DIAGNOSIS — I480 Paroxysmal atrial fibrillation: Secondary | ICD-10-CM | POA: Diagnosis not present

## 2015-01-29 NOTE — Patient Instructions (Signed)
Medication Instructions:  Your physician recommends that you continue on your current medications as directed. Please refer to the Current Medication list given to you today.  Follow-Up: Follow up in 6 months with Dr. Hochrein.  You will receive a letter in the mail 2 months before you are due.  Please call us when you receive this letter to schedule your follow up appointment.  Thank you for choosing [facility]!!       
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
## 2015-02-06 ENCOUNTER — Other Ambulatory Visit: Payer: Self-pay | Admitting: *Deleted

## 2015-02-06 NOTE — Patient Outreach (Signed)
02/06/15-Telephone call from Alton Revere from [place] stating she had talked with patient about his denied claims,  Verdis Frederickson reports she will be following up with Dr.Case (since [place] talked with MD about starting appeal process) and Verdis Frederickson will also follow up on EMS bill for 75.00.  Maria to be back in touch with [facility] RN CM.  [name] [facility], [place] Coordinator [phone]

## 2015-02-07 ENCOUNTER — Ambulatory Visit (INDEPENDENT_AMBULATORY_CARE_PROVIDER_SITE_OTHER): Payer: Commercial Managed Care - HMO | Admitting: Pharmacist

## 2015-02-07 DIAGNOSIS — D500 Iron deficiency anemia secondary to blood loss (chronic): Secondary | ICD-10-CM

## 2015-02-07 DIAGNOSIS — I480 Paroxysmal atrial fibrillation: Secondary | ICD-10-CM | POA: Diagnosis not present

## 2015-02-07 LAB — POCT CBC
Granulocyte percent: 57.5 %G (ref 37–80)
HCT, POC: 35.2 % — AB (ref 43.5–53.7)
Hemoglobin: 10.8 g/dL — AB (ref 14.1–18.1)
Lymph, poc: 2.3 (ref 0.6–3.4)
MCH, POC: 25.8 pg — AB (ref 27–31.2)
MCHC: 30.6 g/dL — AB (ref 31.8–35.4)
MCV: 84.4 fL (ref 80–97)
MPV: 7.9 fL (ref 0–99.8)
PLATELET COUNT, POC: 218 10*3/uL (ref 142–424)
POC GRANULOCYTE: 3.8 (ref 2–6.9)
POC LYMPH PERCENT: 34.6 %L (ref 10–50)
RBC: 4.17 M/uL — AB (ref 4.69–6.13)
RDW, POC: 19.3 %
WBC: 6.6 10*3/uL (ref 4.6–10.2)

## 2015-02-07 LAB — POCT INR: INR: 1.9

## 2015-02-07 NOTE — Patient Instructions (Signed)
Anticoagulation Dose Instructions as of 02/07/2015      Keith Doyle Tue Wed Thu Fri Sat   New Dose 5 mg 2.5 mg 5 mg 5 mg 5 mg 2.5 mg 5 mg    Description        Take extra 1/2 tablet today - Friday, June 3rd.  Then continue current warfarin dose of 1/2 tablet on mondays and fridays and 1 tablet all other days.     INR was 1.9 today

## 2015-02-10 ENCOUNTER — Other Ambulatory Visit: Payer: Self-pay | Admitting: *Deleted

## 2015-02-10 NOTE — Patient Outreach (Signed)
02/10/15- Telephone call to pt to provide update, RN CM received voicemail from Theotis Barrio with [employer] [phone] stating claims for Dr. Case have been appealed and Verdis Frederickson has a copy and forwarded to her supervisor and waiting on a response,  the EMS claim for 75.00 is denied, not covered and no hardship plan in place but EMS can set up a payment, RN CM explained all the above to pt and he verbalizes understanding.  [name] [facility], [place] Coordinator [phone]

## 2015-02-19 ENCOUNTER — Other Ambulatory Visit: Payer: Self-pay | Admitting: *Deleted

## 2015-02-19 ENCOUNTER — Encounter: Payer: Self-pay | Admitting: *Deleted

## 2015-02-19 NOTE — Patient Outreach (Signed)
[facility]) Care Management   02/19/2015  Keith Doyle 1936-06-22 [id number]  Keith Doyle is an 79 y.o. male  Subjective: Routine home visit with pt, HIPAA verified, pt reports [employer] [place] is coming to his home end of June to do repair work on his porch and build a ramp so he can leave home easily and avoid going down steps. Pt reports his hemoglobin "is up to 10.8 and eating more meat in diet"  Pt states he checked CBG end of May with random reading of 91.  Pt using voltaren gel for back pain and states " it really does help me"  Pt would like his [employer] claim resolved and "still has not heard anything"   Objective:   Filed Vitals:   02/19/15 1043  BP: 128/58  Pulse: 66  Resp: 18  SpO2: 97%  CBG 91 end of May- pt checks 1-2 times per month ROS  Physical Exam  Constitutional: He is oriented to person, place, and time. He appears well-developed and well-nourished.  HENT:  Head: Normocephalic.  Neck: Normal range of motion. Neck supple.  Cardiovascular: Normal rate and regular rhythm.   Respiratory: Breath sounds normal.  GI: Soft. Bowel sounds are normal.  Musculoskeletal: Normal range of motion. He exhibits edema.  Dependent edema lower extremities bil.  Neurological: He is alert and oriented to person, place, and time.  Skin: Skin is warm and dry.  Psychiatric: He has a normal mood and affect. His behavior is normal. Judgment and thought content normal.    Current Medications:   Current Outpatient Prescriptions  Medication Sig Dispense Refill  . amLODipine (NORVASC) 10 MG tablet Take 10 mg by mouth daily.    Marland Kitchen atorvastatin (LIPITOR) 40 MG tablet Take 1 tablet (40 mg total) by mouth daily. 90 tablet 1  . benazepril (LOTENSIN) 40 MG tablet Take 1 tablet (40 mg total) by mouth 2 (two) times daily. 180 tablet 1  . fenofibrate 160 MG tablet Take 1 tablet (160 mg total) by mouth daily. 90 tablet 1  . ferrous sulfate 325 (65 FE) MG tablet  Take 325 mg by mouth daily with breakfast.     . fish oil-omega-3 fatty acids 1000 MG capsule Take 1 g by mouth daily.     . furosemide (LASIX) 20 MG tablet Take 1 tablet (20 mg total) by mouth 2 (two) times daily as needed for fluid or edema. 30 tablet 1  . gabapentin (NEURONTIN) 400 MG capsule Take 2 capsules (800 mg total) by mouth 2 (two) times daily.    Marland Kitchen glucose blood test strip Use to check blood glucose once daily.  Dx:  Type 2 diabetes E11.9 100 each 3  . meclizine (ANTIVERT) 50 MG tablet Take 1 tablet (50 mg total) by mouth 2 (two) times daily as needed. 60 tablet 11  . metFORMIN (GLUCOPHAGE) 500 MG tablet Take 1 tablet (500 mg total) by mouth 2 (two) times daily with a meal.    . pantoprazole (PROTONIX) 40 MG tablet Take 1 tablet (40 mg total) by mouth daily. 90 tablet 3  . potassium chloride SA (K-DUR,KLOR-CON) 20 MEQ tablet Take 0.5 tablets (10 mEq total) by mouth 2 (two) times daily as needed. When you take lasix. 30 tablet 0  . warfarin (COUMADIN) 5 MG tablet Take 0.5-1 tablets (2.5-5 mg total) by mouth daily. Take 1 tablet (109m) evry day except 1/2 tablet (2.523m on Mon & Fri     No current  facility-administered medications for this visit.    Functional Status:   In your present state of health, do you have any difficulty performing the following activities: 01/01/2015 12/31/2014  Hearing? - N  Vision? - N  Difficulty concentrating or making decisions? - N  Walking or climbing stairs? - Y  Dressing or bathing? - N  Doing errands, shopping? N -  Preparing Food and eating ? - -  Using the Toilet? - -  In the past six months, have you accidently leaked urine? - -  Do you have problems with loss of bowel control? - -  Managing your Medications? - -  Managing your Finances? - -  Housekeeping or managing your Housekeeping? - -    Fall/Depression Screening:    PHQ 2/9 Scores 12/26/2014 12/05/2014 10/03/2014 02/04/2014  PHQ - 2 Score 0 0 0 0    Assessment:  RN CM called  [employer] to talk with Alton Revere to discuss outstanding claim that has been appealed, spoke with overflow operator after holding for 15 minutes and unable to speak with Verdis Frederickson, left message with operator requesting call back for update on claims/ appeal.  RN CM talked with pt about diet including meats, beans and importance of keeping hemoglobin within normal limits.  Pt seems to be trying to do better with his diet citing he is eating more meat.  [facility] CM Care Plan Problem One        Patient Outreach from 02/19/2015 in [place] Problem One  difficulty navigating health care system   Care Plan for Problem One  Active   THN Long Term Goal (31-90 days)  pt will be able to navigate health care system (MD appointments, insurance questions, reportable signs/ symptoms) within 90 days   THN Long Term Goal Start Date  12/12/14   Interventions for Problem One Long Term Goal  RN CM reviewed resources to call for questions regarding health status or any other health related need (MD, RN CM, 24 hour nurse line, [employer] nurse line), RN CM called [employer] claims and left message to have Alysia Penna call back regarding claims/ appeal. [pt needs reinforcement]   THN CM Short Term Goal #2 Met Date  -- [pt verbalizes all upcoming MD appointments]   THN CM Short Term Goal #3 (0-30 days)  pt will have appeal process started for [facility] claim within 30 days   THN CM Short Term Goal #3 Start Date  01/22/15   [facility] CM Short Term Goal #3 Met Date  02/19/15 Etta Quill process has been started]    [facility] CM Care Plan Problem Two        Patient Outreach from 02/19/2015 in [place] Problem Two  Knowledge deficit related to low sodium diet (for hypertension, CHF although controlled).   Care Plan for Problem Two  Active   Interventions for Problem Two Long Term Goal   Reviewed being mindful of sodium intake at each meal and reading labels   THN Long Term Goal (31-90) days   pt will make food choices that support low sodium diet within 90 days.   THN Long Term Goal Start Date  12/26/14   THN CM Short Term Goal #1 Met Date   -- [pt reports he is trying to be more mindful of sodium intake]    [facility] CM Care Plan Problem Three        Patient Outreach from 02/19/2015 in [employer]  Care Plan Problem Three  High risk for falls   Care Plan for Problem Three  Active   THN Long Term Goal (31-90) days  pt will have no falls within 90 days.   THN Long Term Goal Start Date  12/26/14   Interventions for Problem Three Long Term Goal  RN CM reviewed safety precautions and instructed pt to ask for assistance if dizzy, etc.   THN CM Short Term Goal #1 (0-30 days)  pt will use proper safety techniques within 30 days aeb by no falls [goal restarted, pt states he lost balance and hit elbow]   THN CM Short Term Goal #1 Start Date  02/19/15 [goal restarted, pt has had no falls but nees reinforcement]   Interventions for Short Term Goal #1  RN CM reviewed being careful when leaving home, stepping down at curbs, climbing stairs.   THN CM Short Term Goal #2 (0-30 days)  pt will have ramp built at front of home for easier access to leave home within 30 days   THN CM Short Term Goal #2 Start Date  02/19/15   Interventions for Short Term Goal #2  RN CM reviewed expectations and work to be completed, with pt, provided by [employer]        Plan: follow up with home visit 03/26/15 Discharge if no further outstanding needs, new health issues or may transfer to health coach Follow up [place] pt contact number to [facility] for any issues with future claims  [name] [facility], [place] Coordinator [phone]

## 2015-02-25 ENCOUNTER — Ambulatory Visit: Payer: Commercial Managed Care - HMO | Admitting: Urology

## 2015-02-28 ENCOUNTER — Other Ambulatory Visit: Payer: Self-pay

## 2015-03-11 ENCOUNTER — Other Ambulatory Visit: Payer: Self-pay

## 2015-03-11 DIAGNOSIS — M5442 Lumbago with sciatica, left side: Principal | ICD-10-CM

## 2015-03-11 DIAGNOSIS — M5441 Lumbago with sciatica, right side: Secondary | ICD-10-CM

## 2015-03-13 ENCOUNTER — Encounter: Payer: Self-pay | Admitting: Physician Assistant

## 2015-03-13 ENCOUNTER — Ambulatory Visit (INDEPENDENT_AMBULATORY_CARE_PROVIDER_SITE_OTHER): Payer: Commercial Managed Care - HMO | Admitting: Physician Assistant

## 2015-03-13 VITALS — BP 125/65 | HR 58 | Temp 97.5°F | Ht 72.0 in | Wt 222.0 lb

## 2015-03-13 DIAGNOSIS — I480 Paroxysmal atrial fibrillation: Secondary | ICD-10-CM | POA: Diagnosis not present

## 2015-03-13 DIAGNOSIS — I878 Other specified disorders of veins: Secondary | ICD-10-CM

## 2015-03-13 DIAGNOSIS — R600 Localized edema: Secondary | ICD-10-CM | POA: Diagnosis not present

## 2015-03-13 LAB — POCT INR: INR: 2.2

## 2015-03-13 NOTE — Patient Instructions (Addendum)
Peripheral Edema You have swelling in your legs (peripheral edema). This swelling is due to excess accumulation of salt and water in your body. Edema may be a sign of heart, kidney or liver disease, or a side effect of a medication. It may also be due to problems in the leg veins. Elevating your legs and using special support stockings may be very helpful, if the cause of the swelling is due to poor venous circulation. Avoid long periods of standing, whatever the cause. Treatment of edema depends on identifying the cause. Chips, pretzels, pickles and other salty foods should be avoided. Restricting salt in your diet is almost always needed. Water pills (diuretics) are often used to remove the excess salt and water from your body via urine. These medicines prevent the kidney from reabsorbing sodium. This increases urine flow. Diuretic treatment may also result in lowering of potassium levels in your body. Potassium supplements may be needed if you have to use diuretics daily. Daily weights can help you keep track of your progress in clearing your edema. You should call your caregiver for follow up care as recommended. SEEK IMMEDIATE MEDICAL CARE IF:   You have increased swelling, pain, redness, or heat in your legs.  You develop shortness of breath, especially when lying down.  You develop chest or abdominal pain, weakness, or fainting.  You have a fever. Document Released: 09/30/2004 Document Revised: 11/15/2011 Document Reviewed: 09/10/2009 [facility] Patient Information 2015 [place], [place]. This information is not intended to replace advice given to you by your health care provider. Make sure you discuss any questions you have with your health care provider.   Anticoagulation Dose Instructions as of 03/13/2015      Dorene Grebe Tue Wed Thu Fri Sat   New Dose 5 mg 2.5 mg 5 mg 5 mg 5 mg 2.5 mg 5 mg    Description        Continue current warfarin dose of 1/2 tablet on mondays and fridays and 1 tablet  all other days.     INR was 2.2 today

## 2015-03-13 NOTE — Progress Notes (Signed)
   Subjective:    Patient ID: Keith Doyle, male    DOB: 1935/09/11, 79 y.o.   MRN: [id number]  HPI 79 y/o male presents with increased swelling in BLE over the past week. .He has chronic LE edema with numerous comorbitidies. He takes Lasix on a daily basis and states that he has not missed any of his medication. He states that the swelling went down overnight and is not as bad today. He states that he normally goes "dancing" but he hasn't been doing this in the past 3-4 weeks because his feet swell. He also states that he was up on his feet more yesterday because he push mowed the yard, with no SOB or difficulty. Also states increased salt intake     Review of Systems  Constitutional: Negative for unexpected weight change.  Respiratory: Negative for shortness of breath.   Cardiovascular: Negative for chest pain.  Genitourinary: Positive for frequency (with taking Lasix ). Negative for dysuria, urgency, hematuria, decreased urine volume and difficulty urinating.       Objective:   Physical Exam  Constitutional: He is oriented to person, place, and time. He appears well-developed and well-nourished.  Cardiovascular: Exam reveals no gallop and no friction rub.   No murmur heard. Pulmonary/Chest: Effort normal and breath sounds normal. No respiratory distress. He has no wheezes. He has no rales. He exhibits no tenderness.  Musculoskeletal:  Has on tight ankle socks Minimal edema, possible trace on  BLE - no pitting. Slight line on leg where ankle socks cut off  Neurological: He is alert and oriented to person, place, and time.  Psychiatric: He has a normal mood and affect. His behavior is normal. Judgment and thought content normal.  Nursing note and vitals reviewed.         Assessment & Plan:  1. Bilateral lower extremity edema -Rx written for compression hose to wear daily. May take off at bedtime - [id number]+EGFR - I feel that this is due to patients increased salt intake and  increased standing yesterday while working in the yard. I have advised him to limit water and sodium intake and keep legs elevated as much as possible   2. Venous stasis - Compression hose daily   3. Paroxysmal atrial fibrillation  - POCT INR   Continue all meds Labs pending Health Maintenance reviewed Diet and exercise encouraged RTO {prn   Riona Lahti A. Benjamin Stain PA-C

## 2015-03-14 ENCOUNTER — Telehealth: Payer: Self-pay | Admitting: *Deleted

## 2015-03-14 LAB — CMP14+EGFR
A/G RATIO: 2 (ref 1.1–2.5)
ALBUMIN: 4.2 g/dL (ref 3.5–4.8)
ALK PHOS: 45 IU/L (ref 39–117)
ALT: 21 IU/L (ref 0–44)
AST: 16 IU/L (ref 0–40)
BILIRUBIN TOTAL: 0.2 mg/dL (ref 0.0–1.2)
BUN / CREAT RATIO: 11 (ref 10–22)
BUN: 15 mg/dL (ref 8–27)
CO2: 23 mmol/L (ref 18–29)
CREATININE: 1.33 mg/dL — AB (ref 0.76–1.27)
Calcium: 9.6 mg/dL (ref 8.6–10.2)
Chloride: 106 mmol/L (ref 97–108)
GFR, EST AFRICAN AMERICAN: 60 mL/min/{1.73_m2} (ref >59)
GFR, EST NON AFRICAN AMERICAN: 52 mL/min/{1.73_m2} — AB (ref >59)
GLOBULIN, TOTAL: 2.1 g/dL (ref 1.5–4.5)
Glucose: 105 mg/dL — ABNORMAL HIGH (ref 65–99)
POTASSIUM: 4 mmol/L (ref 3.5–5.2)
SODIUM: 147 mmol/L — AB (ref 134–144)
TOTAL PROTEIN: 6.3 g/dL (ref 6.0–8.5)

## 2015-03-14 NOTE — Telephone Encounter (Signed)
-----   Message from Adella Nissen, PA-C sent at 03/14/2015  8:31 AM EDT ----- Sodium levels elevated - His increased leg swelling likely due to increased salt intake as we discussed during visit. Make sure he is limiting salt intake and not adding additional salt  to foods. Tiffany A. Benjamin Stain PA-C

## 2015-03-14 NOTE — Telephone Encounter (Signed)
Please call.

## 2015-03-18 ENCOUNTER — Telehealth: Payer: Self-pay | Admitting: Family Medicine

## 2015-03-18 NOTE — Telephone Encounter (Signed)
Pt aware of lab results & copy mailed

## 2015-03-21 ENCOUNTER — Ambulatory Visit (INDEPENDENT_AMBULATORY_CARE_PROVIDER_SITE_OTHER): Payer: Commercial Managed Care - HMO | Admitting: Physician Assistant

## 2015-03-21 ENCOUNTER — Encounter: Payer: Self-pay | Admitting: Physician Assistant

## 2015-03-21 VITALS — BP 122/71 | HR 74 | Ht 72.0 in | Wt 220.2 lb

## 2015-03-21 DIAGNOSIS — D500 Iron deficiency anemia secondary to blood loss (chronic): Secondary | ICD-10-CM | POA: Diagnosis not present

## 2015-03-21 DIAGNOSIS — R42 Dizziness and giddiness: Secondary | ICD-10-CM | POA: Diagnosis not present

## 2015-03-21 LAB — POCT HEMOGLOBIN: Hemoglobin: 12.1 g/dL — AB (ref 14.1–18.1)

## 2015-03-21 NOTE — Progress Notes (Signed)
Subjective:     Patient ID: Keith Doyle, male   DOB: [birth year], 79 y.o.   MRN: [id number]  HPI Pt here for recheck of his anemia State he has been doing well Still with some positional vertigo if he changes position too quick  Review of Systems     Objective:   Physical Exam Results for orders placed or performed in visit on 03/21/15  POCT hemoglobin  Result Value Ref Range   Hemoglobin 12.1 (A) 14.1 - 18.1 g/dL       Assessment:     Anemia    Plan:     Hgb continues to improve Continue with current tx Would still like him to change positions slowly

## 2015-03-21 NOTE — Patient Instructions (Signed)
Anemia, Nonspecific Anemia is a condition in which the concentration of red blood cells or hemoglobin in the blood is below normal. Hemoglobin is a substance in red blood cells that carries oxygen to the tissues of the body. Anemia results in not enough oxygen reaching these tissues.  CAUSES  Common causes of anemia include:   Excessive bleeding. Bleeding may be internal or external. This includes excessive bleeding from periods (in women) or from the intestine.   Poor nutrition.   Chronic kidney, thyroid, and liver disease.  Bone marrow disorders that decrease red blood cell production.  Cancer and treatments for cancer.  HIV, AIDS, and their treatments.  Spleen problems that increase red blood cell destruction.  Blood disorders.  Excess destruction of red blood cells due to infection, medicines, and autoimmune disorders. SIGNS AND SYMPTOMS   Minor weakness.   Dizziness.   Headache.  Palpitations.   Shortness of breath, especially with exercise.   Paleness.  Cold sensitivity.  Indigestion.  Nausea.  Difficulty sleeping.  Difficulty concentrating. Symptoms may occur suddenly or they may develop slowly.  DIAGNOSIS  Additional blood tests are often needed. These help your health care provider determine the best treatment. Your health care provider will check your stool for blood and look for other causes of blood loss.  TREATMENT  Treatment varies depending on the cause of the anemia. Treatment can include:   Supplements of iron, vitamin B12, or folic acid.   Hormone medicines.   A blood transfusion. This may be needed if blood loss is severe.   Hospitalization. This may be needed if there is significant continual blood loss.   Dietary changes.  Spleen removal. HOME CARE INSTRUCTIONS Keep all follow-up appointments. It often takes many weeks to correct anemia, and having your health care provider check on your condition and your response to  treatment is very important. SEEK IMMEDIATE MEDICAL CARE IF:   You develop extreme weakness, shortness of breath, or chest pain.   You become dizzy or have trouble concentrating.  You develop heavy vaginal bleeding.   You develop a rash.   You have bloody or black, tarry stools.   You faint.   You vomit up blood.   You vomit repeatedly.   You have abdominal pain.  You have a fever or persistent symptoms for more than 2-3 days.   You have a fever and your symptoms suddenly get worse.   You are dehydrated.  MAKE SURE YOU:  Understand these instructions.  Will watch your condition.  Will get help right away if you are not doing well or get worse. Document Released: 09/30/2004 Document Revised: 04/25/2013 Document Reviewed: 02/16/2013 Exit[employer] Patient Information 2015 [employer]. This information is not intended to replace advice given to you by your health care provider. Make sure you discuss any questions you have with your health care provider.  

## 2015-03-26 ENCOUNTER — Other Ambulatory Visit: Payer: Self-pay | Admitting: *Deleted

## 2015-03-26 ENCOUNTER — Encounter: Payer: Self-pay | Admitting: *Deleted

## 2015-03-26 NOTE — Patient Outreach (Signed)
[place] [facility]) Care Management   03/26/2015  Keith Doyle 19-Jul-1936 [id number]  Keith Doyle is an 79 y.o. male  Subjective: Initial home visit with pt, HIPAA verified,  Pt states "my hemoglobin is up to 12 and I feel better"  Pt states " I never heard from my claims, don't know if bill has been paid or not, now I have a new bill from [facility] and I need help setting up payments"  Pt states his family does not assist him with insurance claims, setting up payments and that pt has difficulty understanding the process.  Pt states " can you call girl at doctor office and find out why I ow them $20, they explained it but I don't know what they're talking about"  Pt states he is still eating restaurant food and most likely consuming too much sodium at times because "feet swell sometime".  Objective:   Filed Vitals:   03/26/15 1116  BP: 118/60  Pulse: 60  Resp: 18  SpO2: 96%  CBG ranges fasting 98-110 Weight 218 pounds ROS  Physical Exam  Constitutional: He is oriented to person, place, and time. He appears well-developed.  HENT:  Head: Normocephalic.  Neck: Normal range of motion.  Cardiovascular:  Irregular rhythym  Respiratory: Effort normal and breath sounds normal.  GI: Soft. Bowel sounds are normal.  Musculoskeletal: Normal range of motion. He exhibits no edema.  Neurological: He is alert and oriented to person, place, and time.  Skin: Skin is dry.  Psychiatric: He has a normal mood and affect. His behavior is normal. Thought content normal.    Current Medications:   Current Outpatient Prescriptions  Medication Sig Dispense Refill  . amLODipine (NORVASC) 10 MG tablet Take 10 mg by mouth daily.    Marland Kitchen atorvastatin (LIPITOR) 40 MG tablet Take 1 tablet (40 mg total) by mouth daily. 90 tablet 1  . benazepril (LOTENSIN) 40 MG tablet Take 1 tablet (40 mg total) by mouth 2 (two) times daily. 180 tablet 1  . fenofibrate 160 MG tablet Take 1 tablet (160 mg total) by mouth  daily. 90 tablet 1  . ferrous sulfate 325 (65 FE) MG tablet Take 325 mg by mouth daily with breakfast.     . fish oil-omega-3 fatty acids 1000 MG capsule Take 1 g by mouth daily.     . furosemide (LASIX) 20 MG tablet Take 1 tablet (20 mg total) by mouth 2 (two) times daily as needed for fluid or edema. 30 tablet 1  . gabapentin (NEURONTIN) 400 MG capsule Take 2 capsules (800 mg total) by mouth 2 (two) times daily.    Marland Kitchen glucose blood test strip Use to check blood glucose once daily.  Dx:  Type 2 diabetes E11.9 100 each 3  . meclizine (ANTIVERT) 50 MG tablet Take 1 tablet (50 mg total) by mouth 2 (two) times daily as needed. 60 tablet 11  . metFORMIN (GLUCOPHAGE) 500 MG tablet Take 1 tablet (500 mg total) by mouth 2 (two) times daily with a meal.    . pantoprazole (PROTONIX) 40 MG tablet Take 1 tablet (40 mg total) by mouth daily. 90 tablet 3  . potassium chloride SA (K-DUR,KLOR-CON) 20 MEQ tablet Take 0.5 tablets (10 mEq total) by mouth 2 (two) times daily as needed. When you take lasix. 30 tablet 0  . warfarin (COUMADIN) 5 MG tablet Take 0.5-1 tablets (2.5-5 mg total) by mouth daily. Take 1 tablet (15m) evry day except 1/2 tablet (2.569m on  Mon & Fri     No current facility-administered medications for this visit.    Functional Status:   In your present state of health, do you have any difficulty performing the following activities: 01/01/2015 12/31/2014  Hearing? - N  Vision? - N  Difficulty concentrating or making decisions? - N  Walking or climbing stairs? - Y  Dressing or bathing? - N  Doing errands, shopping? N -  Preparing Food and eating ? - -  Using the Toilet? - -  In the past six months, have you accidently leaked urine? - -  Do you have problems with loss of bowel control? - -  Managing your Medications? - -  Managing your Finances? - -  Housekeeping or managing your Housekeeping? - -    Fall/Depression Screening:    PHQ 2/9 Scores 03/26/2015 12/26/2014 12/05/2014 10/03/2014  02/04/2014  PHQ - 2 Score 0 0 0 0 0   Fall Risk  03/26/2015 01/22/2015 12/26/2014 12/05/2014 10/03/2014  Falls in the past year? Yes - Yes Yes Yes  Number falls in past yr: 2 or more - 2 or more 1 1  Injury with Fall? No - - Yes No  Risk Factor Category  High Fall Risk - - - -  Risk for fall due to : History of fall(s);Medication side effect History of fall(s);Medication side effect (No Data) - Impaired mobility  Risk for fall due to (comments): - - tripped on uneven surfaces - -  Follow up Education provided;Falls prevention discussed;Falls evaluation completed - Falls evaluation completed;Education provided;Falls prevention discussed - -    Assessment:  RN CM called Dr. Ammie Ferrier office, spoke with Suanne Marker Case about bill for 20.36, per Suanne Marker pt now owes a small portion of the bill when he has venipuncture due to being applied to 166.00 deductible and at times pt has not paid his $10 copay for his visit and this is part of his balance. RN CM called [facility] to set up payment plan for pt on $1100.00 bill, per customer service, [employer] will send information in the mail and pt has to call them within 28 days to activate account, pt chose to do the 37 month payment plan with interest as this is easier for him. RN CM explained to pt why he owes the balances he does owe and pt verbalizes understanding, pt is very grateful that appeal for Dr. Case went through and this bill is paid in full (see care plan for details).  Pt still does not understand the process about insurance claims, deductibles, copays or calling to set up payments.  Pt is hard of hearing and states he likes his telephone (uses speaker phone) and says he can hear "good enough", not interested in another type of phone. RN CM will continue to see pt in the home for now to assist with navigating health care system and to reinforce low sodium diet that also has adequate iron.  RN CM reviewed foods high in iron and low in sodium as food choices  (see care plan). RN CM faxed quarterly update to primary care MD- Dr. Sabra Heck.  [facility] CM Care Plan Problem One        Patient Outreach from 03/26/2015 in [place] Problem One  difficulty navigating health care system   Care Plan for Problem One  Active   [facility] Long Term Goal (31-90 days)  pt will be able to navigate health care system (MD appointments, insurance questions,  reportable signs/ symptoms) within 90 days   [facility] Long Term Goal Start Date  03/26/15 Barrie Folk restarted- pt not independent]   Interventions for Problem One Long Term Goal  RN CM reiterated resources to call for questions regarding health status or any other health related need (MD, RN CM, 24 hour nurse line, [employer] nurse line), RN CM called [employer] claims to talk with Theotis Barrio to make sure claims have been paid, was on hold 15 minutes and unable to speak to anyone.  RN CM called Dr. Aliene Beams office, spoke with Claiborne Billings who reports all bills have been paid, the appeal went through. [pt needs reinforcement]   [facility] CM Short Term Goal #2 Met Date  -- [pt verbalizes all upcoming MD appointments]   [facility] CM Short Term Goal #3 Met Date  -- [appeal process has been started]    [facility] CM Care Plan Problem Two        Patient Outreach from 03/26/2015 in [place] Problem Two  Knowledge deficit related to low sodium diet (for hypertension, CHF although controlled).   Care Plan for Problem Two  Active   Interventions for Problem Two Long Term Goal   Reiterated being mindful of sodium intake at each meal and reading labels, also being careful with restaurant food which has lots of hidden sodium and sugar.   [facility] Long Term Goal (31-90) days  pt will make food choices that support low sodium diet within 90 days.   [facility] Long Term Goal Start Date  03/26/15 Barrie Folk restarted- pt needs reinforcement]   [facility] CM Short Term Goal #1 Met Date   -- [pt reports he is trying to be more mindful of sodium intake]     [facility] CM Care Plan Problem Three        Patient Outreach from 03/26/2015 in [place] Problem Three  High risk for falls   Care Plan for Problem Three  Active   [facility] Long Term Goal (31-90) days  pt will have no falls within 90 days.   [facility] Long Term Goal Start Date  03/26/15 [pt has had fall within past 90 days/ goal not met]   Interventions for Problem Three Long Term Goal  RN CM reiterated safety precautions and instructed pt to ask for assistance if dizzy, etc.   [facility] CM Short Term Goal #1 (0-30 days)  pt will use proper safety techniques within 30 days aeb by no falls [goal restarted, pt states he lost balance and hit elbow]   [facility] CM Short Term Goal #1 Start Date  02/19/15 [goal restarted, pt has had no falls but nees reinforcement]   [facility] CM Short Term Goal #1 Met Date  03/26/15   Interventions for Short Term Goal #1  RN CM reinforced being careful when leaving home, stepping down at curbs, climbing stairs.   [facility] CM Short Term Goal #2 (0-30 days)  pt will have ramp built at front of home for easier access to leave home within 30 days   [facility] CM Short Term Goal #2 Start Date  02/19/15   [facility] CM Short Term Goal #2 Met Date  03/26/15 [pt now has wheelchair ramp]      Plan: follow up with home visit 04/22/15 Ask if received correspondence from [employer] for monthly payments of hospital bill, and called to activate.  [name] [facility], [place] Coordinator [phone]

## 2015-04-07 ENCOUNTER — Encounter: Payer: Self-pay | Admitting: Gastroenterology

## 2015-04-15 ENCOUNTER — Ambulatory Visit (INDEPENDENT_AMBULATORY_CARE_PROVIDER_SITE_OTHER): Payer: Commercial Managed Care - HMO | Admitting: Pharmacist Clinician (PhC)/ Clinical Pharmacy Specialist

## 2015-04-15 DIAGNOSIS — I480 Paroxysmal atrial fibrillation: Secondary | ICD-10-CM | POA: Diagnosis not present

## 2015-04-15 LAB — POCT INR: INR: 1.9

## 2015-04-21 ENCOUNTER — Ambulatory Visit: Payer: Commercial Managed Care - HMO | Admitting: Family Medicine

## 2015-04-22 ENCOUNTER — Ambulatory Visit (INDEPENDENT_AMBULATORY_CARE_PROVIDER_SITE_OTHER): Payer: Commercial Managed Care - HMO | Admitting: Family Medicine

## 2015-04-22 ENCOUNTER — Encounter: Payer: Self-pay | Admitting: Gastroenterology

## 2015-04-22 ENCOUNTER — Encounter: Payer: Self-pay | Admitting: *Deleted

## 2015-04-22 ENCOUNTER — Other Ambulatory Visit: Payer: Self-pay | Admitting: *Deleted

## 2015-04-22 ENCOUNTER — Encounter: Payer: Self-pay | Admitting: Family Medicine

## 2015-04-22 ENCOUNTER — Encounter: Payer: Self-pay | Admitting: Nurse Practitioner

## 2015-04-22 VITALS — BP 96/53 | HR 60 | Temp 98.3°F | Ht 72.0 in | Wt 221.0 lb

## 2015-04-22 DIAGNOSIS — R710 Precipitous drop in hematocrit: Secondary | ICD-10-CM

## 2015-04-22 DIAGNOSIS — D649 Anemia, unspecified: Secondary | ICD-10-CM | POA: Diagnosis not present

## 2015-04-22 DIAGNOSIS — D500 Iron deficiency anemia secondary to blood loss (chronic): Secondary | ICD-10-CM | POA: Diagnosis not present

## 2015-04-22 LAB — POCT HEMOGLOBIN: HEMOGLOBIN: 9.7 g/dL — AB (ref 14.1–18.1)

## 2015-04-22 NOTE — Patient Outreach (Signed)
[place] [facility]) Care Management   04/22/2015  Keith Doyle 18-Jan-1936 [id number]  Keith Doyle is an 79 y.o. male  Subjective: Routine home visit with pt. HIPAA verified, pt reports " I'm doing well, trying to stay cool"  Pt states " I get short of breath sometimes, especially with the heat".  Pt to see his primary MD today, no medication changes reported, pt states " I ran out of my potassium and called [employer], it's been mailed and on the way, my son has same dosage and he's giving me a few pills to get me through until mine arrives"  Pt states he is not interested in requesting prescription for a fews day of potassium to get him through until his arrives.  Pt reports he called [employer] and "have all my hospital payments set up now"  Pt states he has no outstanding claims that he needs assistance with.  Objective:   ROS  Physical Exam  Constitutional: He is oriented to person, place, and time. He appears well-developed.  HENT:  Head: Normocephalic.  Neck: Normal range of motion.  Cardiovascular: Normal rate and regular rhythm.   Respiratory: Effort normal.  Pt has dypsnea with exertion, especially outdoors in the hot weather.  GI: Soft. Bowel sounds are normal.  Musculoskeletal: Normal range of motion.  Dependent edema lower extremities bil.  Neurological: He is alert and oriented to person, place, and time.  Skin: Skin is warm and dry.  Psychiatric: He has a normal mood and affect. His behavior is normal. Judgment and thought content normal.    Current Medications:   Current Outpatient Prescriptions  Medication Sig Dispense Refill  . amLODipine (NORVASC) 10 MG tablet Take 10 mg by mouth daily.    Marland Kitchen atorvastatin (LIPITOR) 40 MG tablet Take 1 tablet (40 mg total) by mouth daily. 90 tablet 1  . benazepril (LOTENSIN) 40 MG tablet Take 1 tablet (40 mg total) by mouth 2 (two) times daily. 180 tablet 1  . fenofibrate 160 MG tablet Take 1 tablet (160 mg total) by mouth  daily. 90 tablet 1  . ferrous sulfate 325 (65 FE) MG tablet Take 325 mg by mouth daily with breakfast.     . fish oil-omega-3 fatty acids 1000 MG capsule Take 1 g by mouth daily.     . furosemide (LASIX) 20 MG tablet Take 1 tablet (20 mg total) by mouth 2 (two) times daily as needed for fluid or edema. 30 tablet 1  . gabapentin (NEURONTIN) 400 MG capsule Take 2 capsules (800 mg total) by mouth 2 (two) times daily.    Marland Kitchen glucose blood test strip Use to check blood glucose once daily.  Dx:  Type 2 diabetes E11.9 100 each 3  . meclizine (ANTIVERT) 50 MG tablet Take 1 tablet (50 mg total) by mouth 2 (two) times daily as needed. 60 tablet 11  . metFORMIN (GLUCOPHAGE) 500 MG tablet Take 1 tablet (500 mg total) by mouth 2 (two) times daily with a meal.    . pantoprazole (PROTONIX) 40 MG tablet Take 1 tablet (40 mg total) by mouth daily. 90 tablet 3  . potassium chloride SA (K-DUR,KLOR-CON) 20 MEQ tablet Take 0.5 tablets (10 mEq total) by mouth 2 (two) times daily as needed. When you take lasix. 30 tablet 0  . warfarin (COUMADIN) 5 MG tablet Take 0.5-1 tablets (2.5-5 mg total) by mouth daily. Take 1 tablet (76m) evry day except 1/2 tablet (2.563m on Mon & Fri  No current facility-administered medications for this visit.    Functional Status:   In your present state of health, do you have any difficulty performing the following activities: 04/22/2015 01/01/2015  Hearing? [place]? N -  Difficulty concentrating or making decisions? N -  Walking or climbing stairs? Y -  Dressing or bathing? N -  Doing errands, shopping? N N  Preparing Food and eating ? - -  Using the Toilet? - -  In the past six months, have you accidently leaked urine? - -  Do you have problems with loss of bowel control? - -  Managing your Medications? - -  Managing your Finances? - -  Housekeeping or managing your Housekeeping? - -    Fall/Depression Screening:    PHQ 2/9 Scores 03/26/2015 12/26/2014 12/05/2014 10/03/2014  02/04/2014  PHQ - 2 Score 0 0 0 0 0    Assessment:  RN CM talked with pt about importance of not running completely out of any medication and to call ahead of time for refills.  RN CM talked with assistant director Bary Castilla about pt case being closed today and agreed upon, that if in the future, pt has medical claims that he cannot get resolved, to call [facility] main number and then pt can be assisted for collaboration with [employer].  Pt agreeable to discharge plan and verbalizes understanding of the above instructions. Mailed case closure letter to primary MD and to patient's home.  [facility] CM Care Plan Problem One        Patient Outreach from 04/22/2015 in [place] Problem One  difficulty navigating health care system   Care Plan for Problem One  Active   [facility] Long Term Goal (31-90 days)  pt will be able to navigate health care system (MD appointments, insurance questions, reportable signs/ symptoms) within 90 days   [facility] Long Term Goal Start Date  03/26/15 Barrie Folk restarted- pt not independent]   [facility] Long Term Goal Met Date  04/22/15 [pt has been contacting providers, etc on his own]   Interventions for Problem One Long Term Goal  RN CM reiterated resources to call for questions regarding health status or any other health related need (MD, RN CM, 24 hour nurse line), RN CM wrote out toll free main number for [facility] office and ask pt to call in the future if he has any issues with claims that he is not able to handle on his own and ask pt to leave voicemail if unable to speak with anyone, instructed that [facility] can assist with collaboration with [employer]. [pt needs reinforcement]   [facility] CM Short Term Goal #2 Met Date  -- [pt verbalizes all upcoming MD appointments]   [facility] CM Short Term Goal #3 Met Date  -- [appeal process has been started]    [facility] CM Care Plan Problem Two        Patient Outreach from 04/22/2015 in [place] Problem Two  Knowledge deficit  related to low sodium diet (for hypertension, CHF although controlled).   Care Plan for Problem Two  Active   Interventions for Problem Two Long Term Goal   Reinforced being mindful of sodium intake at each meal and reading labels, also being careful with restaurant food which has lots of hidden sodium and sugar.   [facility] Long Term Goal (31-90) days  pt will make food choices that support low sodium diet within 90 days.   [facility]  Long Term Goal Start Date  03/26/15 Barrie Folk restarted- pt needs reinforcement]   [facility] Long Term Goal Met Date  04/22/15   [facility] CM Short Term Goal #1 Met Date   -- [pt reports he is trying to be more mindful of sodium intake]    [facility] CM Care Plan Problem Three        Patient Outreach from 04/22/2015 in [place] Problem Three  High risk for falls   Care Plan for Problem Three  Active   [facility] Long Term Goal (31-90) days  pt will have no falls within 90 days.   [facility] Long Term Goal Start Date  03/26/15 [pt has had fall within past 22 days/ goal not met]   [facility] Long Term Goal Met Date  04/22/15 [pt has had no recent falls]   Interventions for Problem Three Long Term Goal  RN CM reinforced safety precautions and instructed pt to ask for assistance if dizzy, etc.   [facility] CM Short Term Goal #1 (0-30 days)  -- Barrie Folk restarted, pt states he lost balance and hit elbow]   [facility] CM Short Term Goal #1 Start Date  -- [goal restarted, pt has had no falls but nees reinforcement]   [facility] CM Short Term Goal #2 Met Date  -- [pt now has wheelchair ramp]      Plan: Case closed today.  [name] [facility], [place] Coordinator [phone]

## 2015-04-22 NOTE — Progress Notes (Signed)
Subjective:    Patient ID: Keith Doyle, male    DOB: 1936-05-11, 79 y.o.   MRN: [id number]  HPI 79 year old gentleman with bilateral knee pain.; he came today to really have the knees injected. This has been beneficial in the past. As a side note he is on iron therapy for blood loss anemia and he would like to have hemoglobin checked. It was last checked 1 month ago that found to be 12.1 g.gentleman with bilateral knee pain.  Patient Active Problem List   Diagnosis Date Noted  . Chronic anticoagulation   . Renal insufficiency 12/30/2014  . Multiple gastric ulcers 12/07/2014  . Colon polyps 12/07/2014  . Hemorrhoids 12/07/2014  . Warfarin-induced coagulopathy 12/06/2014  . Bradycardia 12/06/2014  . Anemia due to chronic blood loss 12/05/2014  . AKI (acute kidney injury) 12/05/2014  . Lumbar back pain 12/05/2014  . Paroxysmal a-fib 12/05/2014  . Chronic diastolic congestive heart failure 12/05/2014  . HLD (hyperlipidemia) 12/05/2014  . Back pain   . Paroxysmal atrial fibrillation 10/03/2014  . Osteopenia of the elderly 10/03/2014  . Dizziness 06/20/2012  . Exertional dyspnea 06/20/2012  . Diabetes mellitus with neuropathy 06/20/2012  . Influenza A 08/28/2011  . Hypokalemia 08/28/2011  . ARF (acute renal failure) 08/28/2011  . BPH (benign prostatic hyperplasia) 11/28/2010  . DVT of lower extremity (deep venous thrombosis) 11/28/2010  . Osteopenia 11/28/2010  . ED (erectile dysfunction) 11/28/2010  . Peripheral neuropathy 11/28/2010  . Hyperlipidemia 11/28/2010  . OVERWEIGHT 01/02/2009  . CARDIOVASCULAR FUNCTION STUDY, ABNORMAL 01/02/2009  . OTH NONSPECIFIC ABNORM CV SYSTEM FUNCTION STUDY 01/02/2009  . Essential hypertension 01/01/2009   Outpatient Encounter Prescriptions as of 04/22/2015  Medication Sig  . amLODipine (NORVASC) 10 MG tablet Take 10 mg by mouth daily.  Marland Kitchen atorvastatin (LIPITOR) 40 MG tablet Take 1 tablet (40 mg total) by mouth daily.  . benazepril (LOTENSIN)  40 MG tablet Take 1 tablet (40 mg total) by mouth 2 (two) times daily.  . fenofibrate 160 MG tablet Take 1 tablet (160 mg total) by mouth daily.  . ferrous sulfate 325 (65 FE) MG tablet Take 325 mg by mouth daily with breakfast.   . fish oil-omega-3 fatty acids 1000 MG capsule Take 1 g by mouth daily.   . furosemide (LASIX) 20 MG tablet Take 1 tablet (20 mg total) by mouth 2 (two) times daily as needed for fluid or edema.  . gabapentin (NEURONTIN) 400 MG capsule Take 2 capsules (800 mg total) by mouth 2 (two) times daily.  Marland Kitchen glucose blood test strip Use to check blood glucose once daily.  Dx:  Type 2 diabetes E11.9  . meclizine (ANTIVERT) 50 MG tablet Take 1 tablet (50 mg total) by mouth 2 (two) times daily as needed.  . metFORMIN (GLUCOPHAGE) 500 MG tablet Take 1 tablet (500 mg total) by mouth 2 (two) times daily with a meal.  . pantoprazole (PROTONIX) 40 MG tablet Take 1 tablet (40 mg total) by mouth daily.  . potassium chloride SA (K-DUR,KLOR-CON) 20 MEQ tablet Take 0.5 tablets (10 mEq total) by mouth 2 (two) times daily as needed. When you take lasix.  Marland Kitchen warfarin (COUMADIN) 5 MG tablet Take 0.5-1 tablets (2.5-5 mg total) by mouth daily. Take 1 tablet (5mg ) evry day except 1/2 tablet (2.5mg ) on Mon & Fri   No facility-administered encounter medications on file as of 04/22/2015.      Review of Systems  Respiratory: Negative.   Cardiovascular: Negative.   Gastrointestinal: Negative.  Musculoskeletal: Positive for arthralgias.       Objective:   Physical Exam  Musculoskeletal:  Both knees injected with Depo-Medrol and Marcaine after identifying landmarks skin prep with alcohol and ethyl chloride spray for anesthesia. He tolerated this well.    BP 96/53 mmHg  Pulse 60  Temp(Src) 98.3 F (36.8 C) (Oral)  Ht 6' (1.829 m)  Wt 221 lb (100.245 kg)  BMI 29.97 kg/m2       Assessment & Plan:  1. Iron deficiency anemia due to chronic blood loss   Hemoglobin has dropped almost 3 g in  one month. He has no symptoms but this is worrisome and needs further evaluation  - Ambulatory referral to Gastroenterology  2. Decreased hemoglobin  - Ambulatory referral to Gastroenterology

## 2015-04-25 NOTE — Patient Outreach (Signed)
[place] [facility]) Care Management  04/22/2015  Keith Doyle 04/29/1936 [id number]   Notification from [name], RN to close case due to goals met with [place] Management.  Thanks, [name]. [place], [place] Assistant Phone: [phone] Fax: [phone]

## 2015-05-07 ENCOUNTER — Ambulatory Visit (INDEPENDENT_AMBULATORY_CARE_PROVIDER_SITE_OTHER): Payer: Commercial Managed Care - HMO | Admitting: Nurse Practitioner

## 2015-05-07 ENCOUNTER — Encounter: Payer: Self-pay | Admitting: Nurse Practitioner

## 2015-05-07 ENCOUNTER — Other Ambulatory Visit: Payer: Self-pay

## 2015-05-07 VITALS — BP 106/71 | HR 85 | Temp 97.9°F | Ht 72.0 in | Wt 218.8 lb

## 2015-05-07 DIAGNOSIS — D500 Iron deficiency anemia secondary to blood loss (chronic): Secondary | ICD-10-CM

## 2015-05-07 DIAGNOSIS — K259 Gastric ulcer, unspecified as acute or chronic, without hemorrhage or perforation: Secondary | ICD-10-CM

## 2015-05-07 DIAGNOSIS — R42 Dizziness and giddiness: Secondary | ICD-10-CM | POA: Diagnosis not present

## 2015-05-07 LAB — CBC
HEMATOCRIT: 34.9 % — AB (ref 39.0–52.0)
Hemoglobin: 11.8 g/dL — ABNORMAL LOW (ref 13.0–17.0)
MCH: 28.9 pg (ref 26.0–34.0)
MCHC: 33.8 g/dL (ref 30.0–36.0)
MCV: 85.5 fL (ref 78.0–100.0)
MPV: 10.3 fL (ref 8.6–12.4)
Platelets: 205 10*3/uL (ref 150–400)
RBC: 4.08 MIL/uL — AB (ref 4.22–5.81)
RDW: 17.2 % — ABNORMAL HIGH (ref 11.5–15.5)
WBC: 6 10*3/uL (ref 4.0–10.5)

## 2015-05-07 MED ORDER — PANTOPRAZOLE SODIUM 40 MG PO TBEC: 40.0000 mg | DELAYED_RELEASE_TABLET | Freq: Two times a day (BID) | ORAL | Status: DC
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
## 2015-05-07 NOTE — Patient Instructions (Addendum)
1. Take her Protonix twice a day. He can keep using the bottle you have but she will run out sooner. I will send in a new prescription for 79-year-old run out of her current bottle. This will make sure you have an of pills. 2. I will check her blood work today. 3. We will send you to the hematologist (blood Dr.) at [facility] so they can monitor your blood counts and give you iron or blood transfusions as necessary.

## 2015-05-13 ENCOUNTER — Telehealth: Payer: Self-pay | Admitting: Gastroenterology

## 2015-05-13 ENCOUNTER — Other Ambulatory Visit: Payer: Self-pay | Admitting: Family Medicine

## 2015-05-13 NOTE — Telephone Encounter (Signed)
Pt called for lab results. I told him we have 10-14 business days to sign off and call with results. I will let Walden Field, NP, know that he has called. I did tell him it looked better than it had looked at times.

## 2015-05-13 NOTE — Telephone Encounter (Signed)
Pt called asking about his labs and what the results were. He is anxious about his anemia and wants to speak with the nurse. [phone]

## 2015-05-14 NOTE — Assessment & Plan Note (Signed)
Patient with anemia demonstrated Cameron ulcers on endoscopy. Follow-up capsule endoscopy showed again iron deficiency anemia likely related to [facility] ulcers/linear ulcers previously on EGD, very rapid small bowel transition, small single bowel erosions noted, otherwise unremarkable study. Discussion was had with the patient and family about likely anemia is also remains on anticoagulation. His anemia seems to be episodic in nature. I will recheck his CBC, increase his Protonix back to twice a day due to of recent drop in his hemoglobin again and have him return in 3 months for follow-up. We'll also refer him to hematology today can monitor his blood counts and transfuse and/or give IV iron as necessary.

## 2015-05-14 NOTE — Assessment & Plan Note (Signed)
Patient is had an extensive workup for iron deficiency anemia and deemed likely due to [facility] ulcers/linear ulcers. He continues to have episodic drops in his hemoglobin which tend to rebound relatively quickly. Continue to monitor, I have increased his PPI back to twice a day due to significant drop in his hemoglobin. We'll also refer him to hematology for blood count monitoring and transfusion/IV iron as necessary.

## 2015-05-14 NOTE — Assessment & Plan Note (Signed)
Recent exacerbation of dizziness likely due to his drop in hemoglobin as of late. Continue to monitor. Refer to hematology for monitoring of blood counts and transfusion/IV iron as necessary.

## 2015-05-15 NOTE — Progress Notes (Signed)
cc'ed to pcp °

## 2015-05-16 ENCOUNTER — Encounter (HOSPITAL_COMMUNITY): Payer: Commercial Managed Care - HMO | Attending: Oncology | Admitting: Oncology

## 2015-05-16 VITALS — BP 135/86 | HR 75 | Temp 98.3°F | Resp 16 | Wt 219.1 lb

## 2015-05-16 DIAGNOSIS — D500 Iron deficiency anemia secondary to blood loss (chronic): Secondary | ICD-10-CM | POA: Insufficient documentation

## 2015-05-16 DIAGNOSIS — D649 Anemia, unspecified: Secondary | ICD-10-CM | POA: Diagnosis not present

## 2015-05-16 LAB — COMPREHENSIVE METABOLIC PANEL
ALK PHOS: 37 U/L — AB (ref 38–126)
ALT: 26 U/L (ref 17–63)
AST: 21 U/L (ref 15–41)
Albumin: 3.9 g/dL (ref 3.5–5.0)
Anion gap: 7 (ref 5–15)
BILIRUBIN TOTAL: 0.4 mg/dL (ref 0.3–1.2)
BUN: 15 mg/dL (ref 6–20)
CALCIUM: 9.1 mg/dL (ref 8.9–10.3)
CHLORIDE: 108 mmol/L (ref 101–111)
CO2: 27 mmol/L (ref 22–32)
CREATININE: 1.11 mg/dL (ref 0.61–1.24)
Glucose, Bld: 96 mg/dL (ref 65–99)
Potassium: 3.8 mmol/L (ref 3.5–5.1)
Sodium: 142 mmol/L (ref 135–145)
TOTAL PROTEIN: 6.7 g/dL (ref 6.5–8.1)

## 2015-05-16 LAB — CBC WITH DIFFERENTIAL/PLATELET
Basophils Absolute: 0 10*3/uL (ref 0.0–0.1)
Basophils Relative: 0 % (ref 0–1)
EOS PCT: 2 % (ref 0–5)
Eosinophils Absolute: 0.1 10*3/uL (ref 0.0–0.7)
HEMATOCRIT: 35.9 % — AB (ref 39.0–52.0)
Hemoglobin: 11.4 g/dL — ABNORMAL LOW (ref 13.0–17.0)
LYMPHS ABS: 1.7 10*3/uL (ref 0.7–4.0)
LYMPHS PCT: 28 % (ref 12–46)
MCH: 28.9 pg (ref 26.0–34.0)
MCHC: 31.8 g/dL (ref 30.0–36.0)
MCV: 90.9 fL (ref 78.0–100.0)
Monocytes Absolute: 0.4 10*3/uL (ref 0.1–1.0)
Monocytes Relative: 6 % (ref 3–12)
NEUTROS ABS: 3.9 10*3/uL (ref 1.7–7.7)
Neutrophils Relative %: 64 % (ref 43–77)
PLATELETS: 193 10*3/uL (ref 150–400)
RBC: 3.95 MIL/uL — AB (ref 4.22–5.81)
RDW: 15.9 % — ABNORMAL HIGH (ref 11.5–15.5)
WBC: 6.2 10*3/uL (ref 4.0–10.5)

## 2015-05-16 LAB — RETICULOCYTES
RBC.: 4.02 MIL/uL — AB (ref 4.22–5.81)
RETIC COUNT ABSOLUTE: 64.3 10*3/uL (ref 19.0–186.0)
RETIC CT PCT: 1.6 % (ref 0.4–3.1)

## 2015-05-16 LAB — IRON AND TIBC
Iron: 41 ug/dL — ABNORMAL LOW (ref 45–182)
Saturation Ratios: 9 % — ABNORMAL LOW (ref 17.9–39.5)
TIBC: 462 ug/dL — ABNORMAL HIGH (ref 250–450)
UIBC: 421 ug/dL

## 2015-05-16 LAB — VITAMIN B12: VITAMIN B 12: 358 pg/mL (ref 180–914)

## 2015-05-16 LAB — FOLATE: FOLATE: 20.2 ng/mL (ref >5.9)

## 2015-05-16 LAB — FERRITIN: Ferritin: 19 ng/mL — ABNORMAL LOW (ref 24–336)

## 2015-05-16 NOTE — Assessment & Plan Note (Addendum)
Iron deficiency anemia secondary to chronic GI blood loss from [facility] Ulcers/Linear Ulcers in the setting of chronic anticoagulation with Vitamin K Antagonist and documented fecal tests positive for blood.   INRs are closely monitored and historically are near 2 with infrequent episodes of being supratherapeutic, but multiple occassions of being subtherapeutic.  We will update labs and complete anemia work-up: CBC diff, CMET, anemia panel, retic count, peripheral smear review by pathologist.  If ferritin is low, will replace with IV iron according to calculated iron deficit.  Then, we change PO ferrous sulfate to Niferex or Poly iron (or the like).  Return in 4 weeks for follow-up.
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
## 2015-05-16 NOTE — Patient Instructions (Signed)
[place] at [facility] Discharge Instructions  RECOMMENDATIONS MADE BY THE CONSULTANT AND ANY TEST RESULTS WILL BE SENT TO YOUR REFERRING PHYSICIAN.  1.  Return in 4 months to see Dr. Whitney Muse as scheduled. 2.  Over the counter water with quinine.  Contact us sooner if questions or concerns.  Thank you!  Thank you for choosing [place] at [facility] to provide your oncology and hematology care.  To afford each patient quality time with our provider, please arrive at least 15 minutes before your scheduled appointment time.    You need to re-schedule your appointment should you arrive 10 or more minutes late.  We strive to give you quality time with our providers, and arriving late affects you and other patients whose appointments are after yours.  Also, if you no show three or more times for appointments you may be dismissed from the clinic at the providers discretion.     Again, thank you for choosing [facility].  Our hope is that these requests will decrease the amount of time that you wait before being seen by our physicians.       _____________________________________________________________  Should you have questions after your visit to [facility], please contact our office at [phone] between the hours of 8:30 a.m. and 4:30 p.m.  Voicemails left after 4:30 p.m. will not be returned until the following business day.  For prescription refill requests, have your pharmacy contact our office.

## 2015-05-16 NOTE — Telephone Encounter (Signed)
Results have already been called back to him. See result note.

## 2015-05-16 NOTE — Telephone Encounter (Signed)
See result note. Ginger has left message to call.

## 2015-05-19 ENCOUNTER — Other Ambulatory Visit: Payer: Self-pay

## 2015-05-19 ENCOUNTER — Other Ambulatory Visit: Payer: Self-pay | Admitting: Family Medicine

## 2015-05-19 ENCOUNTER — Other Ambulatory Visit (HOSPITAL_COMMUNITY): Payer: Self-pay | Admitting: Oncology

## 2015-05-19 DIAGNOSIS — D500 Iron deficiency anemia secondary to blood loss (chronic): Secondary | ICD-10-CM

## 2015-05-19 MED ORDER — POTASSIUM CHLORIDE CRYS ER 20 MEQ PO TBCR: 10.0000 meq | EXTENDED_RELEASE_TABLET | Freq: Two times a day (BID) | ORAL | Status: DC | PRN

## 2015-05-19 MED ORDER — POLYSACCHARIDE IRON COMPLEX 150 MG PO CAPS: 150.0000 mg | ORAL_CAPSULE | Freq: Every day | ORAL | Status: DC

## 2015-05-20 ENCOUNTER — Other Ambulatory Visit (HOSPITAL_COMMUNITY): Payer: Self-pay | Admitting: Oncology

## 2015-05-20 DIAGNOSIS — D500 Iron deficiency anemia secondary to blood loss (chronic): Secondary | ICD-10-CM

## 2015-05-20 LAB — PATHOLOGIST SMEAR REVIEW

## 2015-05-20 MED ORDER — IRON POLYSACCH CMPLX-B12-FA 150-0.025-1 MG PO CAPS: 1.0000 | ORAL_CAPSULE | Freq: Every day | ORAL | Status: DC

## 2015-05-20 MED ORDER — POLYSACCHARIDE IRON COMPLEX 150 MG PO CAPS: 150.0000 mg | ORAL_CAPSULE | Freq: Every day | ORAL | Status: DC

## 2015-05-21 ENCOUNTER — Encounter (HOSPITAL_BASED_OUTPATIENT_CLINIC_OR_DEPARTMENT_OTHER): Payer: Commercial Managed Care - HMO

## 2015-05-21 VITALS — BP 123/68 | HR 58 | Temp 98.0°F | Resp 18

## 2015-05-21 DIAGNOSIS — D500 Iron deficiency anemia secondary to blood loss (chronic): Secondary | ICD-10-CM | POA: Diagnosis not present

## 2015-05-21 MED ORDER — SODIUM CHLORIDE 0.9 % IV SOLN
INTRAVENOUS | Status: DC
  Administered 2015-05-21: 11:00:00 via INTRAVENOUS

## 2015-05-21 MED ORDER — SODIUM CHLORIDE 0.9 % IV SOLN
510.0000 mg | Freq: Once | INTRAVENOUS | Status: AC
  Administered 2015-05-21: 510 mg via INTRAVENOUS
  Filled 2015-05-21: qty 17

## 2015-05-21 NOTE — Progress Notes (Signed)
Patient tolerated infusion well.  VSS 30 minutes post infusion.   

## 2015-05-21 NOTE — Patient Instructions (Signed)

## 2015-05-22 ENCOUNTER — Other Ambulatory Visit: Payer: Self-pay | Admitting: Nurse Practitioner

## 2015-05-29 ENCOUNTER — Ambulatory Visit (INDEPENDENT_AMBULATORY_CARE_PROVIDER_SITE_OTHER): Payer: Commercial Managed Care - HMO | Admitting: Pediatrics

## 2015-05-29 ENCOUNTER — Encounter: Payer: Self-pay | Admitting: Pediatrics

## 2015-05-29 ENCOUNTER — Ambulatory Visit (INDEPENDENT_AMBULATORY_CARE_PROVIDER_SITE_OTHER): Payer: Commercial Managed Care - HMO | Admitting: Pharmacist

## 2015-05-29 VITALS — BP 127/85 | HR 83 | Temp 98.2°F | Ht 72.0 in | Wt 217.0 lb

## 2015-05-29 DIAGNOSIS — D509 Iron deficiency anemia, unspecified: Secondary | ICD-10-CM | POA: Diagnosis not present

## 2015-05-29 DIAGNOSIS — M7022 Olecranon bursitis, left elbow: Secondary | ICD-10-CM | POA: Diagnosis not present

## 2015-05-29 DIAGNOSIS — I480 Paroxysmal atrial fibrillation: Secondary | ICD-10-CM | POA: Diagnosis not present

## 2015-05-29 LAB — POCT INR: INR: 2

## 2015-05-29 NOTE — Progress Notes (Signed)
Patient triaged to other provider to evaluate swollen left elbow

## 2015-05-29 NOTE — Addendum Note (Signed)
Addended by: Cherre Robins on: 05/29/2015 11:24 AM   Modules accepted: Level of Service

## 2015-05-29 NOTE — Progress Notes (Signed)
Subjective:    Patient ID: Keith Doyle, male    DOB: 04-14-1936, 79 y.o.   MRN: [id number]  HPI: Keith Doyle is a 79 y.o. male presenting on 05/29/2015 for Joint Swelling  Swelling on elbow, has been there since he broke his elbow 6 months ago. The swelling got better, then he fell on his elbow again and the swelling got much worse. Now smaller than it was, still present. Does not hurt. Bothers him when he puts pressure on his elbow. Able to move and use elbow without pain.  Starting iron infusions, followed by hematology and GI. On warfarin for paroxsymal afib. Otherwise feels well today, no recent fevers/chills. Normal appetite. No blood in stools.    Relevant past medical, surgical, family and social history reviewed and updated as indicated. Interim medical history since our last visit reviewed. Allergies and medications reviewed and updated.   ROS: Per HPI unless specifically indicated above  Past Medical History Patient Active Problem List   Diagnosis Date Noted  . Chronic anticoagulation   . Renal insufficiency 12/30/2014  . Multiple gastric ulcers 12/07/2014  . Colon polyps 12/07/2014  . Hemorrhoids 12/07/2014  . Warfarin-induced coagulopathy 12/06/2014  . Bradycardia 12/06/2014  . Anemia due to chronic blood loss 12/05/2014  . AKI (acute kidney injury) 12/05/2014  . Lumbar back pain 12/05/2014  . Paroxysmal a-fib 12/05/2014  . Chronic diastolic congestive heart failure 12/05/2014  . HLD (hyperlipidemia) 12/05/2014  . Back pain   . Paroxysmal atrial fibrillation 10/03/2014  . Osteopenia of the elderly 10/03/2014  . Dizziness 06/20/2012  . Exertional dyspnea 06/20/2012  . Diabetes mellitus with neuropathy 06/20/2012  . Influenza A 08/28/2011  . Hypokalemia 08/28/2011  . ARF (acute renal failure) 08/28/2011  . BPH (benign prostatic hyperplasia) 11/28/2010  . DVT of lower extremity (deep venous thrombosis) 11/28/2010  . Osteopenia 11/28/2010  . ED (erectile  dysfunction) 11/28/2010  . Peripheral neuropathy 11/28/2010  . Hyperlipidemia 11/28/2010  . OVERWEIGHT 01/02/2009  . CARDIOVASCULAR FUNCTION STUDY, ABNORMAL 01/02/2009  . OTH NONSPECIFIC ABNORM CV SYSTEM FUNCTION STUDY 01/02/2009  . Essential hypertension 01/01/2009    Current Outpatient Prescriptions  Medication Sig Dispense Refill  . amLODipine (NORVASC) 10 MG tablet TAKE 1 TABLET EVERY DAY 90 tablet 0  . atorvastatin (LIPITOR) 40 MG tablet Take 1 tablet (40 mg total) by mouth daily. 90 tablet 1  . benazepril (LOTENSIN) 40 MG tablet Take 1 tablet (40 mg total) by mouth 2 (two) times daily. 180 tablet 1  . fenofibrate 160 MG tablet Take 1 tablet (160 mg total) by mouth daily. 90 tablet 1  . furosemide (LASIX) 20 MG tablet     . gabapentin (NEURONTIN) 400 MG capsule Take 2 capsules (800 mg total) by mouth 2 (two) times daily.    Marland Kitchen glucose blood test strip Use to check blood glucose once daily.  Dx:  Type 2 diabetes E11.9 100 each 3  . Iron Polysacch Cmplx-B12-FA (FERREX 150 FORTE) 150-0.025-1 MG CAPS Take 1 capsule by mouth daily. 30 each 2  . meclizine (ANTIVERT) 50 MG tablet Take 1 tablet (50 mg total) by mouth 2 (two) times daily as needed. 60 tablet 11  . metFORMIN (GLUCOPHAGE) 500 MG tablet Take 1 tablet (500 mg total) by mouth 2 (two) times daily with a meal.    . pantoprazole (PROTONIX) 40 MG tablet Take 1 tablet (40 mg total) by mouth 2 (two) times daily before a meal. 180 tablet 3  . potassium chloride  SA (K-DUR,KLOR-CON) 20 MEQ tablet TAKE TWO TABLETS BY MOUTH DAILY 60 tablet 3  . warfarin (COUMADIN) 5 MG tablet Take 0.5-1 tablets (2.5-5 mg total) by mouth daily. Take 1 tablet (5mg ) evry day except 1/2 tablet (2.5mg ) on Mon & Fri     No current facility-administered medications for this visit.       Objective:    BP 127/85 mmHg  Pulse 83  Temp(Src) 98.2 F (36.8 C) (Oral)  Ht 6' (1.829 m)  Wt 217 lb (98.431 kg)  BMI 29.42 kg/m2  Wt Readings from Last 3 Encounters:    05/29/15 217 lb (98.431 kg)  05/16/15 219 lb 1.6 oz (99.383 kg)  05/07/15 218 lb 12.8 oz (99.247 kg)     Gen: NAD, alert, cooperative with exam, NCAT EYES:  no scleral injection or icterus Resp:  normal WOB Abd: +BS, soft, NTND. no guarding or organomegaly Ext: No edema, warm Neuro: Alert and oriented MSK: soft, fluid filled golf-ball sized swelling on extensor surface L elbow. Not warm or tender. Full ROM elbows b/l. No point tenderness over elbow.     Assessment & Plan:   Keith Doyle was seen today for olecranon bursitis. Recommend he use a heating pad regularly, with time should improve as long as he is able to protect it and not injure it again. He is on warfarin for afib, risk of bleeding is greater than the likely only short term benefit that aspiration of fluid would give before reaccumulation. Is not sore, tender, red, or hot. Not concerned for infection or gout.  Diagnoses and all orders for this visit:  Olecranon bursitis, left  Follow up plan: As scheduled  Assunta Found, MD [facility] 05/29/2015, 12:06 PM

## 2015-05-29 NOTE — Addendum Note (Signed)
Addended by: Earlene Plater on: 05/29/2015 11:43 AM   Modules accepted: Orders

## 2015-05-29 NOTE — Patient Instructions (Signed)
Use heating pad on elbow

## 2015-05-29 NOTE — Patient Instructions (Signed)
Anticoagulation Dose Instructions as of 05/29/2015      Keith Doyle Tue Wed Thu Fri Sat   New Dose 5 mg 2.5 mg 5 mg 5 mg 5 mg 2.5 mg 5 mg    Description        Continue current warfarin dose of 1/2 tablet on mondays and fridays and 1 tablet all other days.     INR was 2.0 today

## 2015-05-30 LAB — CBC WITH DIFFERENTIAL/PLATELET
BASOS: 1 %
Basophils Absolute: 0 10*3/uL (ref 0.0–0.2)
EOS (ABSOLUTE): 0.1 10*3/uL (ref 0.0–0.4)
EOS: 1 %
HEMATOCRIT: 35.5 % — AB (ref 37.5–51.0)
Hemoglobin: 11.6 g/dL — ABNORMAL LOW (ref 12.6–17.7)
IMMATURE GRANULOCYTES: 0 %
Immature Grans (Abs): 0 10*3/uL (ref 0.0–0.1)
LYMPHS ABS: 1.7 10*3/uL (ref 0.7–3.1)
Lymphs: 30 %
MCH: 28.4 pg (ref 26.6–33.0)
MCHC: 32.7 g/dL (ref 31.5–35.7)
MCV: 87 fL (ref 79–97)
MONOS ABS: 0.4 10*3/uL (ref 0.1–0.9)
Monocytes: 7 %
NEUTROS ABS: 3.4 10*3/uL (ref 1.4–7.0)
NEUTROS PCT: 61 %
PLATELETS: 290 10*3/uL (ref 150–379)
RBC: 4.09 x10E6/uL — ABNORMAL LOW (ref 4.14–5.80)
RDW: 16.3 % — AB (ref 12.3–15.4)
WBC: 5.7 10*3/uL (ref 3.4–10.8)

## 2015-06-02 ENCOUNTER — Ambulatory Visit: Payer: Commercial Managed Care - HMO | Admitting: Family Medicine

## 2015-06-06 ENCOUNTER — Encounter (HOSPITAL_COMMUNITY): Payer: Self-pay | Admitting: Oncology

## 2015-06-17 ENCOUNTER — Encounter (HOSPITAL_COMMUNITY): Payer: Commercial Managed Care - HMO | Attending: Hematology & Oncology | Admitting: Hematology & Oncology

## 2015-06-17 ENCOUNTER — Encounter (HOSPITAL_COMMUNITY): Payer: Commercial Managed Care - HMO | Attending: Oncology

## 2015-06-17 VITALS — BP 131/82 | HR 78 | Temp 97.7°F | Resp 16 | Wt 217.0 lb

## 2015-06-17 DIAGNOSIS — Z86718 Personal history of other venous thrombosis and embolism: Secondary | ICD-10-CM | POA: Diagnosis not present

## 2015-06-17 DIAGNOSIS — D500 Iron deficiency anemia secondary to blood loss (chronic): Secondary | ICD-10-CM | POA: Insufficient documentation

## 2015-06-17 DIAGNOSIS — K259 Gastric ulcer, unspecified as acute or chronic, without hemorrhage or perforation: Secondary | ICD-10-CM

## 2015-06-17 DIAGNOSIS — D509 Iron deficiency anemia, unspecified: Secondary | ICD-10-CM

## 2015-06-17 DIAGNOSIS — T45515A Adverse effect of anticoagulants, initial encounter: Principal | ICD-10-CM

## 2015-06-17 DIAGNOSIS — D6832 Hemorrhagic disorder due to extrinsic circulating anticoagulants: Secondary | ICD-10-CM

## 2015-06-17 LAB — IRON AND TIBC
IRON: 46 ug/dL (ref 45–182)
Saturation Ratios: 14 % — ABNORMAL LOW (ref 17.9–39.5)
TIBC: 330 ug/dL (ref 250–450)
UIBC: 284 ug/dL

## 2015-06-17 LAB — CBC
HEMATOCRIT: 36.8 % — AB (ref 39.0–52.0)
HEMOGLOBIN: 11.8 g/dL — AB (ref 13.0–17.0)
MCH: 29.7 pg (ref 26.0–34.0)
MCHC: 32.1 g/dL (ref 30.0–36.0)
MCV: 92.7 fL (ref 78.0–100.0)
Platelets: 179 10*3/uL (ref 150–400)
RBC: 3.97 MIL/uL — AB (ref 4.22–5.81)
RDW: 16.8 % — ABNORMAL HIGH (ref 11.5–15.5)
WBC: 6.1 10*3/uL (ref 4.0–10.5)

## 2015-06-17 LAB — FERRITIN: FERRITIN: 55 ng/mL (ref 24–336)
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
## 2015-06-17 NOTE — Patient Instructions (Signed)
[place] at [facility] Discharge Instructions  RECOMMENDATIONS MADE BY THE CONSULTANT AND ANY TEST RESULTS WILL BE SENT TO YOUR REFERRING PHYSICIAN.  Labs every 6 weeks. If you need an iron infusion we will call and schedule an appointment for you to come in. MD appointment in 3 months. Return as scheduled.  Thank you for choosing [place] at [facility] to provide your oncology and hematology care.  To afford each patient quality time with our provider, please arrive at least 15 minutes before your scheduled appointment time.    You need to re-schedule your appointment should you arrive 10 or more minutes late.  We strive to give you quality time with our providers, and arriving late affects you and other patients whose appointments are after yours.  Also, if you no show three or more times for appointments you may be dismissed from the clinic at the providers discretion.     Again, thank you for choosing [facility].  Our hope is that these requests will decrease the amount of time that you wait before being seen by our physicians.       _____________________________________________________________  Should you have questions after your visit to [facility], please contact our office at [phone] between the hours of 8:30 a.m. and 4:30 p.m.  Voicemails left after 4:30 p.m. will not be returned until the following business day.  For prescription refill requests, have your pharmacy contact our office.

## 2015-06-17 NOTE — Progress Notes (Signed)
Keith Doyle presented for [employer]. Labs per MD order drawn via Peripheral Line 23 gauge needle inserted in left antecubital.  Good blood return present. Procedure without incident.  Needle removed intact. Patient tolerated procedure well.

## 2015-06-18 ENCOUNTER — Encounter (HOSPITAL_COMMUNITY): Payer: Self-pay | Admitting: Hematology & Oncology

## 2015-06-18 ENCOUNTER — Other Ambulatory Visit (HOSPITAL_COMMUNITY): Payer: Self-pay | Admitting: Hematology & Oncology

## 2015-06-23 ENCOUNTER — Other Ambulatory Visit: Payer: Self-pay | Admitting: *Deleted

## 2015-06-23 NOTE — Patient Outreach (Addendum)
06/23/15- Telephone call received from patient stating his hemoglobin and iron level was low recently due to chronic bleeding ulcers and had iron infusion, states " can someone come out here to my home and monitor my hemoglobin"  Pt reports he has all medications, has no issues getting refills, pt still drives and says he is attending all appointments, has no problems leaving home, seeing primary MD, GI doctor, going to cancer center for iron infusion as needed.  RN CM explained to and reminded pt (as he has was a former [facility] pt and familiar with program) the program involves care planning and goal setting, pt cannot verbalize any goals to work towards other than someone monitoring his hemoglobin, which MD is already doing. RN CM called to primary MD office/ [place] and spoke with Marco Collie nurse and explained the above mentioned phone conversation, per Barnett Applebaum, there are no needs for [facility] care management at present, she is in agreement that pt is attending all appointments, still drives and leaves home daily, is under the care of MD with frequent blood work/monitoring and also meeting with pharmacist regulary, pt has had no admissions in over 6 months. RN CM called pt back and explained primary MD does not feel pt has any needs for [facility] care management at present, RN CM urged pt to continue seeing all his doctors regularly and continue having hemoglobin/bloodwork done at MD office, keep all medications refilled timely and take as prescribed and to call MD early for change in health status.  Pt verbalizes understanding and states if anything changes with his health conditions or understanding of health conditions, he will contact RN CM/ MD about referral to [facility].  [name] [facility], [place] Coordinator [phone]

## 2015-06-25 ENCOUNTER — Encounter (HOSPITAL_COMMUNITY): Payer: Self-pay

## 2015-06-25 ENCOUNTER — Encounter (HOSPITAL_BASED_OUTPATIENT_CLINIC_OR_DEPARTMENT_OTHER): Payer: Commercial Managed Care - HMO

## 2015-06-25 DIAGNOSIS — D500 Iron deficiency anemia secondary to blood loss (chronic): Secondary | ICD-10-CM

## 2015-06-25 MED ORDER — SODIUM CHLORIDE 0.9 % IV SOLN
INTRAVENOUS | Status: DC
  Administered 2015-06-25: 14:00:00 via INTRAVENOUS

## 2015-06-25 MED ORDER — SODIUM CHLORIDE 0.9 % IV SOLN
510.0000 mg | Freq: Once | INTRAVENOUS | Status: AC
  Administered 2015-06-25: 510 mg via INTRAVENOUS
  Filled 2015-06-25: qty 17

## 2015-06-25 NOTE — Progress Notes (Signed)
Keith Doyle Tolerated iron infusion well today discharged ambulatory

## 2015-06-25 NOTE — Patient Instructions (Signed)
Iron infusion today Follow up as scheduled Please call the clinic if you have any questions or concerns

## 2015-06-28 ENCOUNTER — Other Ambulatory Visit: Payer: Self-pay | Admitting: Family Medicine

## 2015-06-30 ENCOUNTER — Ambulatory Visit (INDEPENDENT_AMBULATORY_CARE_PROVIDER_SITE_OTHER): Payer: Commercial Managed Care - HMO | Admitting: Pharmacist

## 2015-06-30 DIAGNOSIS — I480 Paroxysmal atrial fibrillation: Secondary | ICD-10-CM | POA: Diagnosis not present

## 2015-06-30 DIAGNOSIS — E1140 Type 2 diabetes mellitus with diabetic neuropathy, unspecified: Secondary | ICD-10-CM | POA: Diagnosis not present

## 2015-06-30 DIAGNOSIS — Z23 Encounter for immunization: Secondary | ICD-10-CM | POA: Diagnosis not present

## 2015-06-30 LAB — POCT INR: INR: 2.9

## 2015-06-30 NOTE — Patient Instructions (Signed)
Anticoagulation Dose Instructions as of 06/30/2015      Keith Doyle Tue Wed Thu Fri Sat   New Dose 5 mg 2.5 mg 5 mg 5 mg 5 mg 2.5 mg 5 mg    Description        Continue current warfarin dose of 1/2 tablet on mondays and fridays and 1 tablet all other days.     INR was 2.9 today

## 2015-06-30 NOTE — Telephone Encounter (Signed)
Last seen 05/29/15 Dr Evette Doffing  Last lipid 09/20/14

## 2015-07-01 LAB — PROTEIN / CREATININE RATIO, URINE
CREATININE, UR: 18.7 mg/dL
Protein, Ur: <4 mg/dL
Protein/Creat Ratio: <214 mg/g creat — ABNORMAL HIGH (ref 0–200)

## 2015-07-16 ENCOUNTER — Encounter: Payer: Self-pay | Admitting: Gastroenterology

## 2015-07-23 ENCOUNTER — Ambulatory Visit (INDEPENDENT_AMBULATORY_CARE_PROVIDER_SITE_OTHER): Payer: Commercial Managed Care - HMO | Admitting: Family Medicine

## 2015-07-23 ENCOUNTER — Encounter: Payer: Self-pay | Admitting: Family Medicine

## 2015-07-23 VITALS — BP 131/73 | HR 87 | Temp 98.1°F | Ht 72.0 in | Wt 218.2 lb

## 2015-07-23 DIAGNOSIS — I10 Essential (primary) hypertension: Secondary | ICD-10-CM | POA: Diagnosis not present

## 2015-07-23 DIAGNOSIS — I480 Paroxysmal atrial fibrillation: Secondary | ICD-10-CM

## 2015-07-23 NOTE — Progress Notes (Signed)
Subjective:    Patient ID: Keith Doyle, male    DOB: September 18, 1935, 79 y.o.   MRN: [id number]  HPI 79 year old gentleman here to have his knees injected. He would also like his heart and lungs checked. He has been told he had an irregular heartbeat before but he has no symptoms. He denies any chest pain or shortness of breath.  Patient Active Problem List   Diagnosis Date Noted  . Chronic anticoagulation   . Renal insufficiency 12/30/2014  . Multiple gastric ulcers 12/07/2014  . Colon polyps 12/07/2014  . Hemorrhoids 12/07/2014  . Warfarin-induced coagulopathy ([place]) 12/06/2014  . Bradycardia 12/06/2014  . Anemia due to chronic blood loss 12/05/2014  . AKI (acute kidney injury) ([place]) 12/05/2014  . Lumbar back pain 12/05/2014  . Paroxysmal a-fib ([place]) 12/05/2014  . Chronic diastolic congestive heart failure ([place]) 12/05/2014  . HLD (hyperlipidemia) 12/05/2014  . Back pain   . Paroxysmal atrial fibrillation ([place]) 10/03/2014  . Osteopenia of the elderly 10/03/2014  . Dizziness 06/20/2012  . Exertional dyspnea 06/20/2012  . Diabetes mellitus with neuropathy ([place]) 06/20/2012  . Influenza A 08/28/2011  . Hypokalemia 08/28/2011  . ARF (acute renal failure) ([place]) 08/28/2011  . BPH (benign prostatic hyperplasia) 11/28/2010  . DVT of lower extremity (deep venous thrombosis) ([place]) 11/28/2010  . Osteopenia 11/28/2010  . ED (erectile dysfunction) 11/28/2010  . Peripheral neuropathy ([place]) 11/28/2010  . Hyperlipidemia 11/28/2010  . OVERWEIGHT 01/02/2009  . CARDIOVASCULAR FUNCTION STUDY, ABNORMAL 01/02/2009  . OTH NONSPECIFIC ABNORM CV SYSTEM FUNCTION STUDY 01/02/2009  . Essential hypertension 01/01/2009   Outpatient Encounter Prescriptions as of 07/23/2015  Medication Sig  . amLODipine (NORVASC) 10 MG tablet TAKE 1 TABLET EVERY DAY  . atorvastatin (LIPITOR) 40 MG tablet TAKE 1 TABLET EVERY DAY  . benazepril (LOTENSIN) 40 MG tablet Take 1 tablet (40 mg total) by mouth 2 (two) times daily.    . fenofibrate 160 MG tablet Take 1 tablet (160 mg total) by mouth daily.  . furosemide (LASIX) 20 MG tablet Take 20 mg by mouth daily.   Marland Kitchen gabapentin (NEURONTIN) 400 MG capsule Take 2 capsules (800 mg total) by mouth 2 (two) times daily.  Marland Kitchen glucose blood test strip Use to check blood glucose once daily.  Dx:  Type 2 diabetes E11.9  . Iron Polysacch Cmplx-B12-FA (FERREX 150 FORTE) 150-0.025-1 MG CAPS Take 1 capsule by mouth daily.  . meclizine (ANTIVERT) 50 MG tablet Take 1 tablet (50 mg total) by mouth 2 (two) times daily as needed.  . metFORMIN (GLUCOPHAGE) 500 MG tablet Take 1 tablet (500 mg total) by mouth 2 (two) times daily with a meal.  . pantoprazole (PROTONIX) 40 MG tablet Take 1 tablet (40 mg total) by mouth 2 (two) times daily before a meal.  . potassium chloride SA (K-DUR,KLOR-CON) 20 MEQ tablet TAKE TWO TABLETS BY MOUTH DAILY  . Vitamins A & D 5000-400 UNITS CAPS 1 capsule daily.  Marland Kitchen warfarin (COUMADIN) 5 MG tablet Take 0.5-1 tablets (2.5-5 mg total) by mouth daily. Take 1 tablet (5mg ) evry day except 1/2 tablet (2.5mg ) on Mon & Fri   No facility-administered encounter medications on file as of 07/23/2015.      Review of Systems  Constitutional: Negative.   Respiratory: Negative.   Cardiovascular: Negative.   Musculoskeletal: Positive for arthralgias.       Objective:   Physical Exam  Constitutional: He appears well-developed and well-nourished.  Cardiovascular: Normal rate and normal heart sounds.   There are infrequent  extrasystoles  Musculoskeletal:  Knees injected bilaterally with combination Marcaine and Depo-Medrol. Injections were accomplished without complications and patient tolerated well          Assessment & Plan:   1. Essential hypertension Blood pressure is well controlled at 131/73. Continue same treatment  2. Paroxysmal a-fib (HCC) He seems to be in sinus rhythm with occasional extrasystole today. He is asymptomatic but continues on with  Coumadin  [name] MD

## 2015-07-24 ENCOUNTER — Other Ambulatory Visit (HOSPITAL_COMMUNITY): Payer: Self-pay

## 2015-07-29 ENCOUNTER — Encounter (HOSPITAL_COMMUNITY): Payer: Commercial Managed Care - HMO | Attending: Oncology

## 2015-07-29 DIAGNOSIS — D509 Iron deficiency anemia, unspecified: Secondary | ICD-10-CM | POA: Diagnosis not present

## 2015-07-29 DIAGNOSIS — D6832 Hemorrhagic disorder due to extrinsic circulating anticoagulants: Secondary | ICD-10-CM

## 2015-07-29 DIAGNOSIS — D500 Iron deficiency anemia secondary to blood loss (chronic): Secondary | ICD-10-CM | POA: Insufficient documentation

## 2015-07-29 DIAGNOSIS — T45515A Adverse effect of anticoagulants, initial encounter: Secondary | ICD-10-CM

## 2015-07-29 DIAGNOSIS — K259 Gastric ulcer, unspecified as acute or chronic, without hemorrhage or perforation: Secondary | ICD-10-CM

## 2015-07-29 LAB — CBC WITH DIFFERENTIAL/PLATELET
Basophils Absolute: 0 10*3/uL (ref 0.0–0.1)
Basophils Relative: 0 %
EOS PCT: 2 %
Eosinophils Absolute: 0.2 10*3/uL (ref 0.0–0.7)
HEMATOCRIT: 39.6 % (ref 39.0–52.0)
HEMOGLOBIN: 13.3 g/dL (ref 13.0–17.0)
LYMPHS ABS: 2.4 10*3/uL (ref 0.7–4.0)
LYMPHS PCT: 30 %
MCH: 31.4 pg (ref 26.0–34.0)
MCHC: 33.6 g/dL (ref 30.0–36.0)
MCV: 93.6 fL (ref 78.0–100.0)
Monocytes Absolute: 0.5 10*3/uL (ref 0.1–1.0)
Monocytes Relative: 6 %
NEUTROS ABS: 5 10*3/uL (ref 1.7–7.7)
NEUTROS PCT: 62 %
Platelets: 249 10*3/uL (ref 150–400)
RBC: 4.23 MIL/uL (ref 4.22–5.81)
RDW: 15.7 % — ABNORMAL HIGH (ref 11.5–15.5)
WBC: 8.1 10*3/uL (ref 4.0–10.5)

## 2015-07-29 LAB — COMPREHENSIVE METABOLIC PANEL
ALK PHOS: 44 U/L (ref 38–126)
ALT: 21 U/L (ref 17–63)
AST: 18 U/L (ref 15–41)
Albumin: 3.9 g/dL (ref 3.5–5.0)
Anion gap: 6 (ref 5–15)
BUN: 9 mg/dL (ref 6–20)
CALCIUM: 9.3 mg/dL (ref 8.9–10.3)
CO2: 28 mmol/L (ref 22–32)
CREATININE: 0.93 mg/dL (ref 0.61–1.24)
Chloride: 107 mmol/L (ref 101–111)
Glucose, Bld: 79 mg/dL (ref 65–99)
Potassium: 3.7 mmol/L (ref 3.5–5.1)
Sodium: 141 mmol/L (ref 135–145)
TOTAL PROTEIN: 6.9 g/dL (ref 6.5–8.1)
Total Bilirubin: 0.2 mg/dL — ABNORMAL LOW (ref 0.3–1.2)

## 2015-07-29 LAB — IRON AND TIBC
Iron: 50 ug/dL (ref 45–182)
Saturation Ratios: 14 % — ABNORMAL LOW (ref 17.9–39.5)
TIBC: 367 ug/dL (ref 250–450)
UIBC: 317 ug/dL

## 2015-07-29 LAB — FERRITIN: Ferritin: 95 ng/mL (ref 24–336)

## 2015-07-29 NOTE — Progress Notes (Signed)
Labs drawn

## 2015-07-30 ENCOUNTER — Other Ambulatory Visit (HOSPITAL_COMMUNITY): Payer: Self-pay | Admitting: *Deleted

## 2015-07-30 ENCOUNTER — Other Ambulatory Visit (HOSPITAL_COMMUNITY): Payer: Self-pay | Admitting: Hematology & Oncology

## 2015-07-30 DIAGNOSIS — Z7901 Long term (current) use of anticoagulants: Secondary | ICD-10-CM

## 2015-07-30 DIAGNOSIS — K259 Gastric ulcer, unspecified as acute or chronic, without hemorrhage or perforation: Secondary | ICD-10-CM

## 2015-07-30 DIAGNOSIS — D500 Iron deficiency anemia secondary to blood loss (chronic): Secondary | ICD-10-CM

## 2015-08-04 ENCOUNTER — Ambulatory Visit (INDEPENDENT_AMBULATORY_CARE_PROVIDER_SITE_OTHER): Payer: Commercial Managed Care - HMO | Admitting: Pharmacist

## 2015-08-04 DIAGNOSIS — I480 Paroxysmal atrial fibrillation: Secondary | ICD-10-CM | POA: Diagnosis not present

## 2015-08-04 LAB — POCT INR: INR: 2.8

## 2015-08-04 NOTE — Patient Instructions (Signed)
Anticoagulation Dose Instructions as of 08/04/2015      Keith Doyle Tue Wed Thu Fri Sat   New Dose 5 mg 2.5 mg 5 mg 5 mg 5 mg 2.5 mg 5 mg    Description        Continue current warfarin dose of 1/2 tablet on mondays and fridays and 1 tablet all other days.       INR today 2.8

## 2015-08-06 ENCOUNTER — Ambulatory Visit (INDEPENDENT_AMBULATORY_CARE_PROVIDER_SITE_OTHER): Payer: Commercial Managed Care - HMO | Admitting: Nurse Practitioner

## 2015-08-06 ENCOUNTER — Ambulatory Visit: Payer: Commercial Managed Care - HMO | Admitting: Nurse Practitioner

## 2015-08-06 ENCOUNTER — Encounter: Payer: Self-pay | Admitting: Nurse Practitioner

## 2015-08-06 VITALS — BP 117/63 | HR 76 | Temp 97.6°F | Ht 70.0 in | Wt 216.8 lb

## 2015-08-06 DIAGNOSIS — D500 Iron deficiency anemia secondary to blood loss (chronic): Secondary | ICD-10-CM | POA: Diagnosis not present

## 2015-08-06 DIAGNOSIS — K259 Gastric ulcer, unspecified as acute or chronic, without hemorrhage or perforation: Secondary | ICD-10-CM

## 2015-08-06 NOTE — Assessment & Plan Note (Signed)
Patient with Keith Doyle ulcers/linear ulcerations on EGD at last hospitalization. He is on warfarin anticoagulation as well. This lead to iron deficiency anemia. Currently asymptomatic from a GI standpoint. Continues to see hematology/oncology for management of his slow oozing anemia. Extensive GI workup thus far. We will have him continue his PPI twice a day, return for follow-up in 6 months here. Continue to see hematology/oncology for management of his anemia. Call if any questions or worsening symptoms. No recent acute bleed.

## 2015-08-06 NOTE — Patient Instructions (Signed)
1. Into taking your acid blocker twice a day. 2. Continue seeing hematology/oncology for management of your anemia. 3. Return for follow-up appointment here in 6 months.

## 2015-08-06 NOTE — Assessment & Plan Note (Signed)
Chronic iron deficiency anemia with episodic decreases in hemoglobin to 11 range. This is likely due to [facility] ulcers/anticoagulation warfarin as noted in history of present illness. His last labs looked good. Continues to see hematology/oncology for anemia management. Currently asymptomatic from a GI standpoint. Continue PPI, return for follow-up in 6 months. Call us if any worsening symptoms or problems.
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
## 2015-08-07 NOTE — Progress Notes (Signed)
CC'D TO PCP °

## 2015-08-11 ENCOUNTER — Encounter (HOSPITAL_COMMUNITY): Payer: Commercial Managed Care - HMO | Attending: Oncology

## 2015-08-11 VITALS — BP 126/61 | HR 66 | Temp 97.5°F | Resp 16

## 2015-08-11 DIAGNOSIS — D500 Iron deficiency anemia secondary to blood loss (chronic): Secondary | ICD-10-CM | POA: Insufficient documentation

## 2015-08-11 MED ORDER — SODIUM CHLORIDE 0.9 % IJ SOLN
10.0000 mL | Freq: Once | INTRAMUSCULAR | Status: AC
  Administered 2015-08-11: 10 mL via INTRAVENOUS

## 2015-08-11 MED ORDER — SODIUM CHLORIDE 0.9 % IV SOLN
INTRAVENOUS | Status: DC
  Administered 2015-08-11: 14:00:00 via INTRAVENOUS

## 2015-08-11 MED ORDER — FERUMOXYTOL INJECTION 510 MG/17 ML
510.0000 mg | Freq: Once | INTRAVENOUS | Status: AC
  Administered 2015-08-11: 510 mg via INTRAVENOUS
  Filled 2015-08-11: qty 17

## 2015-08-11 NOTE — Progress Notes (Signed)
Tolerated iron infusion well. 

## 2015-08-19 ENCOUNTER — Other Ambulatory Visit: Payer: Self-pay | Admitting: Family Medicine

## 2015-08-20 NOTE — Telephone Encounter (Signed)
Last seen 07/23/15  Dr Sabra Heck  Last lipid 09/20/14

## 2015-08-25 ENCOUNTER — Other Ambulatory Visit: Payer: Self-pay | Admitting: Family Medicine

## 2015-08-25 ENCOUNTER — Telehealth: Payer: Self-pay | Admitting: Family Medicine

## 2015-08-25 DIAGNOSIS — H8110 Benign paroxysmal vertigo, unspecified ear: Secondary | ICD-10-CM

## 2015-08-25 MED ORDER — MECLIZINE HCL 50 MG PO TABS: 50.0000 mg | ORAL_TABLET | Freq: Two times a day (BID) | ORAL | Status: DC | PRN

## 2015-08-25 MED ORDER — GABAPENTIN 400 MG PO CAPS: 800.0000 mg | ORAL_CAPSULE | Freq: Two times a day (BID) | ORAL | Status: DC

## 2015-08-25 NOTE — Telephone Encounter (Signed)
done

## 2015-08-25 NOTE — Telephone Encounter (Signed)
[name] and advised he was just seen in November with Dr.Miller and doesn't need to be seen until March. Pt voiced understanding.

## 2015-08-28 ENCOUNTER — Other Ambulatory Visit: Payer: Self-pay | Admitting: Pediatrics

## 2015-08-28 NOTE — Telephone Encounter (Signed)
Last seen 07/23/15  Dr Sabra Heck last lipid 09/20/14

## 2015-09-03 ENCOUNTER — Encounter: Payer: Self-pay | Admitting: Pharmacist

## 2015-09-09 ENCOUNTER — Encounter (HOSPITAL_COMMUNITY): Payer: Commercial Managed Care - HMO | Attending: Oncology | Admitting: Hematology & Oncology

## 2015-09-09 ENCOUNTER — Encounter (HOSPITAL_COMMUNITY): Payer: Self-pay | Admitting: Hematology & Oncology

## 2015-09-09 ENCOUNTER — Encounter (HOSPITAL_BASED_OUTPATIENT_CLINIC_OR_DEPARTMENT_OTHER): Payer: Commercial Managed Care - HMO

## 2015-09-09 VITALS — BP 124/78 | HR 85 | Temp 97.8°F | Resp 18 | Wt 218.2 lb

## 2015-09-09 DIAGNOSIS — K259 Gastric ulcer, unspecified as acute or chronic, without hemorrhage or perforation: Secondary | ICD-10-CM

## 2015-09-09 DIAGNOSIS — K254 Chronic or unspecified gastric ulcer with hemorrhage: Secondary | ICD-10-CM | POA: Diagnosis not present

## 2015-09-09 DIAGNOSIS — D6832 Hemorrhagic disorder due to extrinsic circulating anticoagulants: Secondary | ICD-10-CM

## 2015-09-09 DIAGNOSIS — D509 Iron deficiency anemia, unspecified: Secondary | ICD-10-CM

## 2015-09-09 DIAGNOSIS — T45515A Adverse effect of anticoagulants, initial encounter: Principal | ICD-10-CM

## 2015-09-09 DIAGNOSIS — D500 Iron deficiency anemia secondary to blood loss (chronic): Secondary | ICD-10-CM | POA: Diagnosis not present

## 2015-09-09 LAB — IRON AND TIBC
Iron: 73 ug/dL (ref 45–182)
Saturation Ratios: 23 % (ref 17.9–39.5)
TIBC: 321 ug/dL (ref 250–450)
UIBC: 248 ug/dL

## 2015-09-09 LAB — CBC WITH DIFFERENTIAL/PLATELET
BASOS ABS: 0 10*3/uL (ref 0.0–0.1)
Basophils Relative: 1 %
Eosinophils Absolute: 0.2 10*3/uL (ref 0.0–0.7)
Eosinophils Relative: 3 %
HEMATOCRIT: 38.3 % — AB (ref 39.0–52.0)
Hemoglobin: 12.6 g/dL — ABNORMAL LOW (ref 13.0–17.0)
LYMPHS ABS: 2.4 10*3/uL (ref 0.7–4.0)
LYMPHS PCT: 37 %
MCH: 31.3 pg (ref 26.0–34.0)
MCHC: 32.9 g/dL (ref 30.0–36.0)
MCV: 95 fL (ref 78.0–100.0)
MONO ABS: 0.5 10*3/uL (ref 0.1–1.0)
Monocytes Relative: 7 %
NEUTROS ABS: 3.4 10*3/uL (ref 1.7–7.7)
Neutrophils Relative %: 52 %
Platelets: 219 10*3/uL (ref 150–400)
RBC: 4.03 MIL/uL — ABNORMAL LOW (ref 4.22–5.81)
RDW: 15.4 % (ref 11.5–15.5)
WBC: 6.5 10*3/uL (ref 4.0–10.5)

## 2015-09-09 LAB — COMPREHENSIVE METABOLIC PANEL
ALT: 23 U/L (ref 17–63)
AST: 20 U/L (ref 15–41)
Albumin: 3.8 g/dL (ref 3.5–5.0)
Alkaline Phosphatase: 38 U/L (ref 38–126)
Anion gap: 8 (ref 5–15)
BILIRUBIN TOTAL: 0.3 mg/dL (ref 0.3–1.2)
BUN: 9 mg/dL (ref 6–20)
CO2: 26 mmol/L (ref 22–32)
CREATININE: 0.93 mg/dL (ref 0.61–1.24)
Calcium: 9.4 mg/dL (ref 8.9–10.3)
Chloride: 109 mmol/L (ref 101–111)
Glucose, Bld: 115 mg/dL — ABNORMAL HIGH (ref 65–99)
POTASSIUM: 4.2 mmol/L (ref 3.5–5.1)
Sodium: 143 mmol/L (ref 135–145)
TOTAL PROTEIN: 6.3 g/dL — AB (ref 6.5–8.1)

## 2015-09-09 LAB — FERRITIN: FERRITIN: 137 ng/mL (ref 24–336)
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
## 2015-09-09 NOTE — Patient Instructions (Addendum)
[place] [facility] Discharge Instructions  RECOMMENDATIONS MADE BY THE CONSULTANT AND ANY TEST RESULTS WILL BE SENT TO YOUR REFERRING PHYSICIAN.  Return every 6 weeks for labs and we will call you with the results and any further instruction.   You will return in 3 months to see Marcello Moores, the [place].    Thank you for choosing [place] [facility] to provide your oncology and hematology care.  To afford each patient quality time with our provider, please arrive at least 15 minutes before your scheduled appointment time.    You need to re-schedule your appointment should you arrive 10 or more minutes late.  We strive to give you quality time with our providers, and arriving late affects you and other patients whose appointments are after yours.  Also, if you no show three or more times for appointments you may be dismissed from the clinic at the providers discretion.     Again, thank you for choosing [facility].  Our hope is that these requests will decrease the amount of time that you wait before being seen by our physicians.       _____________________________________________________________  Should you have questions after your visit to [facility], please contact our office at [phone] between the hours of 8:30 a.m. and 4:30 p.m.  Voicemails left after 4:30 p.m. will not be returned until the following business day.  For prescription refill requests, have your pharmacy contact our office.

## 2015-09-11 ENCOUNTER — Telehealth: Payer: Self-pay | Admitting: Pharmacist

## 2015-09-11 ENCOUNTER — Other Ambulatory Visit: Payer: Self-pay | Admitting: Family

## 2015-09-11 NOTE — Progress Notes (Signed)
LABS DRAWN

## 2015-09-11 NOTE — Telephone Encounter (Signed)
Patient missed appt to recheck protime 09/03/2015.  Called to reschedule but NA.   LM to call office to schedule protime.  (patient has AWV visit 10/10/15 but this too long to wait for protime since last protime was 08/04/2015)

## 2015-09-15 ENCOUNTER — Telehealth: Payer: Self-pay | Admitting: Family Medicine

## 2015-09-15 NOTE — Telephone Encounter (Signed)
KCL was just done. Needs meclizine

## 2015-09-15 NOTE — Telephone Encounter (Signed)
TC to pt first, asking about his K+ & meclizine refills. I called [employer] meclizine was not  Filled in December d/t dosage, they only have 25 mg not 50 mg - pt stated during earlier conversation that he is not having dizziness issues right now, so this one will not be filled & will be addressed if needed in the future. His K+ was filled for #90 in November & they will auto mail him his next refill on 09/30/15. Pt aware of this

## 2015-09-19 ENCOUNTER — Ambulatory Visit (HOSPITAL_COMMUNITY): Payer: Commercial Managed Care - HMO | Admitting: Hematology & Oncology

## 2015-09-19 ENCOUNTER — Other Ambulatory Visit (HOSPITAL_COMMUNITY): Payer: Commercial Managed Care - HMO

## 2015-10-10 ENCOUNTER — Telehealth: Payer: Self-pay | Admitting: Pharmacist

## 2015-10-10 ENCOUNTER — Encounter: Payer: Self-pay | Admitting: Pharmacist

## 2015-10-10 ENCOUNTER — Ambulatory Visit (INDEPENDENT_AMBULATORY_CARE_PROVIDER_SITE_OTHER): Payer: Commercial Managed Care - HMO | Admitting: Pharmacist

## 2015-10-10 VITALS — BP 128/68 | HR 62 | Ht 72.0 in | Wt 217.0 lb

## 2015-10-10 DIAGNOSIS — Z0000 Encounter for general adult medical examination without abnormal findings: Secondary | ICD-10-CM

## 2015-10-10 DIAGNOSIS — E785 Hyperlipidemia, unspecified: Secondary | ICD-10-CM | POA: Diagnosis not present

## 2015-10-10 DIAGNOSIS — M8580 Other specified disorders of bone density and structure, unspecified site: Secondary | ICD-10-CM

## 2015-10-10 DIAGNOSIS — E1169 Type 2 diabetes mellitus with other specified complication: Secondary | ICD-10-CM

## 2015-10-10 DIAGNOSIS — I480 Paroxysmal atrial fibrillation: Secondary | ICD-10-CM | POA: Diagnosis not present

## 2015-10-10 DIAGNOSIS — D500 Iron deficiency anemia secondary to blood loss (chronic): Secondary | ICD-10-CM | POA: Diagnosis not present

## 2015-10-10 DIAGNOSIS — E1140 Type 2 diabetes mellitus with diabetic neuropathy, unspecified: Secondary | ICD-10-CM

## 2015-10-10 LAB — POCT INR: INR: 2.2

## 2015-10-10 LAB — POCT GLYCOSYLATED HEMOGLOBIN (HGB A1C): HEMOGLOBIN A1C: 6

## 2015-10-10 MED ORDER — METFORMIN HCL ER 500 MG PO TB24: 500.0000 mg | ORAL_TABLET | Freq: Every day | ORAL | Status: DC
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
## 2015-10-10 NOTE — Patient Instructions (Addendum)
Anticoagulation Dose Instructions as of 10/10/2015      Keith Doyle Tue Wed Thu Fri Sat   New Dose 5 mg 2.5 mg 5 mg 5 mg 5 mg 2.5 mg 5 mg    Description        Continue current warfarin 5mg  dose of 1/2 tablet on mondays and fridays and 1 tablet all other days.      Keith Doyle , Thank you for taking time to come for your Medicare Wellness Visit. I appreciate your ongoing commitment to your health goals. Please review the following plan we discussed and let me know if I can assist you in the future.   These are the goals we discussed: Continue to dance and stay active.  Try the handout I gave you with chair exercises to help with strength and prevent falls Decrease metformin to 500mg  take 1 tablet once a day with food - this should help with the loose bowel movements you are having.  I have made appointment for you to follow up with Dr Sabra Heck.  Recommend eye exam yearly for all  Diabetics. I have sent a referral for DEXA - this is a test to check how thick your bones are and assess your risk of having broken bones.  Our x-ray technician will call you with an appointment time for this.     This is a list of the screening recommended for you and due dates:  Health Maintenance  Topic Date Due  . Shingles Vaccine  03/17/1998  . Hemoglobin A1C  01/2016  . Complete foot exam   10/04/2015  . Eye exam for diabetics  11/07/2015*  . DEXA scan (bone density measurement)  11/07/2015*  . Flu Shot  04/06/2016  . Tetanus Vaccine  03/01/2021  . Pneumonia vaccines  Completed  *Topic was postponed. The date shown is not the original due date.      Fall Prevention in the Home  Falls can cause injuries and can affect people from all age groups. There are many simple things that you can do to make your home safe and to help prevent falls. WHAT CAN I DO ON THE OUTSIDE OF MY HOME?  Regularly repair the edges of walkways and driveways and fix any cracks.  Remove high doorway thresholds.  Trim any shrubbery  on the main path into your home.  Use bright outdoor lighting.  Clear walkways of debris and clutter, including tools and rocks.  Regularly check that handrails are securely fastened and in good repair. Both sides of any steps should have handrails.  Install guardrails along the edges of any raised decks or porches.  Have leaves, snow, and ice cleared regularly.  Use sand or salt on walkways during winter months.  In the garage, clean up any spills right away, including grease or oil spills. WHAT CAN I DO IN THE BATHROOM?  Use night lights.  Install grab bars by the toilet and in the tub and shower. Do not use towel bars as grab bars.  Use non-skid mats or decals on the floor of the tub or shower.  If you need to sit down while you are in the shower, use a plastic, non-slip stool.Marland Kitchen  Keep the floor dry. Immediately clean up any water that spills on the floor.  Remove soap buildup in the tub or shower on a regular basis.  Attach bath mats securely with double-sided non-slip rug tape.  Remove throw rugs and other tripping hazards from the floor. WHAT CAN  I DO IN THE BEDROOM?  Use night lights.  Make sure that a bedside light is easy to reach.  Do not use oversized bedding that drapes onto the floor.  Have a firm chair that has side arms to use for getting dressed.  Remove throw rugs and other tripping hazards from the floor. WHAT CAN I DO IN THE KITCHEN?   Clean up any spills right away.  Avoid walking on wet floors.  Place frequently used items in easy-to-reach places.  If you need to reach for something above you, use a sturdy step stool that has a grab bar.  Keep electrical cables out of the way.  Do not use floor polish or wax that makes floors slippery. If you have to use wax, make sure that it is non-skid floor wax.  Remove throw rugs and other tripping hazards from the floor. WHAT CAN I DO IN THE STAIRWAYS?  Do not leave any items on the stairs.  Make  sure that there are handrails on both sides of the stairs. Fix handrails that are broken or loose. Make sure that handrails are as long as the stairways.  Check any carpeting to make sure that it is firmly attached to the stairs. Fix any carpet that is loose or worn.  Avoid having throw rugs at the top or bottom of stairways, or secure the rugs with carpet tape to prevent them from moving.  Make sure that you have a light switch at the top of the stairs and the bottom of the stairs. If you do not have them, have them installed. WHAT ARE SOME OTHER FALL PREVENTION TIPS?  Wear closed-toe shoes that fit well and support your feet. Wear shoes that have rubber soles or low heels.  When you use a stepladder, make sure that it is completely opened and that the sides are firmly locked. Have someone hold the ladder while you are using it. Do not climb a closed stepladder.  Add color or contrast paint or tape to grab bars and handrails in your home. Place contrasting color strips on the first and last steps.  Use mobility aids as needed, such as canes, walkers, scooters, and crutches.  Turn on lights if it is dark. Replace any light bulbs that burn out.  Set up furniture so that there are clear paths. Keep the furniture in the same spot.  Fix any uneven floor surfaces.  Choose a carpet design that does not hide the edge of steps of a stairway.  Be aware of any and all pets.  Review your medicines with your healthcare provider. Some medicines can cause dizziness or changes in blood pressure, which increase your risk of falling. Talk with your health care provider about other ways that you can decrease your risk of falls. This may include working with a physical therapist or trainer to improve your strength, balance, and endurance.   This information is not intended to replace advice given to you by your health care provider. Make sure you discuss any questions you have with your health care  provider.   Document Released: 08/13/2002 Document Revised: 01/07/2015 Document Reviewed: 09/27/2014 Elsevier Interactive Patient Education [employer].

## 2015-10-10 NOTE — Telephone Encounter (Signed)
Patient notified that he forgot handouts and AVS  From today.  He will stop by later to pick up. - left at front desk for him.

## 2015-10-11 LAB — CBC WITH DIFFERENTIAL/PLATELET
BASOS ABS: 0.1 10*3/uL (ref 0.0–0.2)
BASOS: 1 %
EOS (ABSOLUTE): 0.1 10*3/uL (ref 0.0–0.4)
Eos: 3 %
Hematocrit: 37.9 % (ref 37.5–51.0)
Hemoglobin: 13 g/dL (ref 12.6–17.7)
IMMATURE GRANS (ABS): 0 10*3/uL (ref 0.0–0.1)
IMMATURE GRANULOCYTES: 1 %
LYMPHS: 38 %
Lymphocytes Absolute: 1.6 10*3/uL (ref 0.7–3.1)
MCH: 32.3 pg (ref 26.6–33.0)
MCHC: 34.3 g/dL (ref 31.5–35.7)
MCV: 94 fL (ref 79–97)
Monocytes Absolute: 0.3 10*3/uL (ref 0.1–0.9)
Monocytes: 8 %
NEUTROS PCT: 49 %
Neutrophils Absolute: 2.1 10*3/uL (ref 1.4–7.0)
PLATELETS: 233 10*3/uL (ref 150–379)
RBC: 4.03 x10E6/uL — AB (ref 4.14–5.80)
RDW: 15.1 % (ref 12.3–15.4)
WBC: 4.2 10*3/uL (ref 3.4–10.8)

## 2015-10-11 LAB — LIPID PANEL
CHOLESTEROL TOTAL: 124 mg/dL (ref 100–199)
Chol/HDL Ratio: 4.4 ratio units (ref 0.0–5.0)
HDL: 28 mg/dL — ABNORMAL LOW (ref >39)
LDL CALC: 41 mg/dL (ref 0–99)
Triglycerides: 276 mg/dL — ABNORMAL HIGH (ref 0–149)
VLDL CHOLESTEROL CAL: 55 mg/dL — AB (ref 5–40)

## 2015-10-11 LAB — HEPATIC FUNCTION PANEL
ALBUMIN: 4.2 g/dL (ref 3.5–4.8)
ALT: 18 IU/L (ref 0–44)
AST: 18 IU/L (ref 0–40)
Alkaline Phosphatase: 44 IU/L (ref 39–117)
Bilirubin Total: 0.3 mg/dL (ref 0.0–1.2)
Bilirubin, Direct: 0.09 mg/dL (ref 0.00–0.40)
Total Protein: 6 g/dL (ref 6.0–8.5)

## 2015-10-21 ENCOUNTER — Encounter (HOSPITAL_COMMUNITY): Payer: Commercial Managed Care - HMO | Attending: Oncology

## 2015-10-21 ENCOUNTER — Other Ambulatory Visit: Payer: Self-pay | Admitting: Family Medicine

## 2015-10-21 DIAGNOSIS — D500 Iron deficiency anemia secondary to blood loss (chronic): Secondary | ICD-10-CM | POA: Insufficient documentation

## 2015-10-27 ENCOUNTER — Other Ambulatory Visit: Payer: Self-pay | Admitting: Family Medicine

## 2015-11-05 ENCOUNTER — Other Ambulatory Visit: Payer: Self-pay | Admitting: Cardiology

## 2015-11-05 NOTE — Telephone Encounter (Signed)
REFILL 

## 2015-11-07 ENCOUNTER — Encounter: Payer: Self-pay | Admitting: Family Medicine

## 2015-11-07 ENCOUNTER — Ambulatory Visit (INDEPENDENT_AMBULATORY_CARE_PROVIDER_SITE_OTHER): Payer: Commercial Managed Care - HMO | Admitting: Family Medicine

## 2015-11-07 VITALS — BP 145/81 | HR 83 | Temp 97.8°F | Ht 72.0 in | Wt 215.8 lb

## 2015-11-07 DIAGNOSIS — I5032 Chronic diastolic (congestive) heart failure: Secondary | ICD-10-CM

## 2015-11-07 DIAGNOSIS — I10 Essential (primary) hypertension: Secondary | ICD-10-CM

## 2015-11-07 DIAGNOSIS — M25561 Pain in right knee: Secondary | ICD-10-CM

## 2015-11-07 DIAGNOSIS — E1140 Type 2 diabetes mellitus with diabetic neuropathy, unspecified: Secondary | ICD-10-CM

## 2015-11-07 DIAGNOSIS — I480 Paroxysmal atrial fibrillation: Secondary | ICD-10-CM

## 2015-11-07 DIAGNOSIS — M25562 Pain in left knee: Secondary | ICD-10-CM

## 2015-11-07 DIAGNOSIS — M25522 Pain in left elbow: Secondary | ICD-10-CM

## 2015-11-07 LAB — POCT INR: INR: 2.6

## 2015-11-07 NOTE — Patient Instructions (Signed)
Thank you for allowing us to care for you today. We strive to provide exceptional quality and compassionate care. Please let us know how we are doing and how we can help serve you better by filling out the survey that you receive from [employer].     

## 2015-11-07 NOTE — Progress Notes (Signed)
Subjective:    Patient ID: Keith Doyle, male    DOB: [birth year], 80 y.o.   MRN: [id number]  HPI 80 year old gentleman here to follow-up diabetes anemia and degenerative joint disease primarily in his knees. Today he also questions swelling over his left elbow that has been there for several months. It occurred after a fall and has gone away. It does not affect his range of motion at the elbow. He was seen at her 44 office in [place] one month ago to today and his lipids were all at goal but his LDL was 41. His A1c at that time was 6.0. He is also requesting injections for his knees. This seems to help and he has been getting these injections with steroid about every 4 months.  Patient Active Problem List   Diagnosis Date Noted  . Iron deficiency anemia due to chronic blood loss 07/30/2015  . Chronic anticoagulation   . Renal insufficiency 12/30/2014  . Multiple gastric ulcers 12/07/2014  . Colon polyps 12/07/2014  . Hemorrhoids 12/07/2014  . Warfarin-induced coagulopathy ([place]) 12/06/2014  . Bradycardia 12/06/2014  . Anemia due to chronic blood loss 12/05/2014  . AKI (acute kidney injury) ([place]) 12/05/2014  . Lumbar back pain 12/05/2014  . Paroxysmal a-fib ([place]) 12/05/2014  . Chronic diastolic congestive heart failure ([place]) 12/05/2014  . HLD (hyperlipidemia) 12/05/2014  . Back pain   . Paroxysmal atrial fibrillation ([place]) 10/03/2014  . Osteopenia of the elderly 10/03/2014  . Dizziness 06/20/2012  . Exertional dyspnea 06/20/2012  . Diabetes mellitus with neuropathy ([place]) 06/20/2012  . Influenza A 08/28/2011  . Hypokalemia 08/28/2011  . ARF (acute renal failure) ([place]) 08/28/2011  . BPH (benign prostatic hyperplasia) 11/28/2010  . DVT of lower extremity (deep venous thrombosis) ([place]) 11/28/2010  . Osteopenia 11/28/2010  . ED (erectile dysfunction) 11/28/2010  . Peripheral neuropathy ([place]) 11/28/2010  . Hyperlipidemia 11/28/2010  . OVERWEIGHT 01/02/2009  .  CARDIOVASCULAR FUNCTION STUDY, ABNORMAL 01/02/2009  . OTH NONSPECIFIC ABNORM CV SYSTEM FUNCTION STUDY 01/02/2009  . Essential hypertension 01/01/2009   Outpatient Encounter Prescriptions as of 11/07/2015  Medication Sig  . amLODipine (NORVASC) 10 MG tablet TAKE 1 TABLET EVERY DAY  . atorvastatin (LIPITOR) 40 MG tablet TAKE 1 TABLET EVERY DAY  . benazepril (LOTENSIN) 40 MG tablet Take 1 tablet (40 mg total) by mouth 2 (two) times daily.  . fenofibrate 160 MG tablet TAKE 1 TABLET EVERY DAY  . furosemide (LASIX) 20 MG tablet Take 1 tablet (20 mg total) by mouth 2 (two) times daily. NEED OV.  Marland Kitchen gabapentin (NEURONTIN) 400 MG capsule TAKE 2 CAPSULES TWICE DAILY  . glucose blood test strip Use to check blood glucose once daily.  Dx:  Type 2 diabetes E11.9  . Iron Polysacch Cmplx-B12-FA (FERREX 150 FORTE) 150-0.025-1 MG CAPS Take 1 capsule by mouth daily.  . meclizine (ANTIVERT) 50 MG tablet Take 1 tablet (50 mg total) by mouth 2 (two) times daily as needed.  . metFORMIN (GLUCOPHAGE-XR) 500 MG 24 hr tablet Take 1 tablet (500 mg total) by mouth daily. Take with a meal  . pantoprazole (PROTONIX) 40 MG tablet Take 1 tablet (40 mg total) by mouth 2 (two) times daily before a meal.  . potassium chloride SA (K-DUR,KLOR-CON) 20 MEQ tablet TAKE 1/2 TABLET TWICE DAILY AS NEEDED  .  WHEN  YOU  TAKE  LASIX ( FUROSEMIDE)  . Vitamins A & D 5000-400 UNITS CAPS 1 capsule daily.  Marland Kitchen warfarin (COUMADIN) 5 MG tablet Take 0.5-1 tablets (  2.5-5 mg total) by mouth daily. Take 1 tablet (5mg ) evry day except 1/2 tablet (2.5mg ) on Mon & Fri   No facility-administered encounter medications on file as of 11/07/2015.      Review of Systems  Constitutional: Negative.   Respiratory: Negative.   Cardiovascular: Negative.   Neurological: Negative.   Psychiatric/Behavioral: Negative.        Objective:   Physical Exam  Constitutional: He appears well-developed and well-nourished.  Cardiovascular: Normal rate.     Pulmonary/Chest: Effort normal and breath sounds normal.  Musculoskeletal:  Left elbow: Clinically patient has olecranon bursitis about 25 mL of blood was aspirated from the bursa and 1 mL of Kenalog was instilled. Patient elbow was then wrapped with Ace wrap as a compression dressing instructed to leave this on 24 hours  Both knees were injected with Depo-Medrol and Marcaine as per our usual custom. He tolerated this well.          Assessment & Plan:  1. Type 2 diabetes mellitus with diabetic neuropathy, without long-term current use of insulin (HCC) C was 6.01 month ago diabetes is well managed on current regimen of amlodipine. - Microalbumin / creatinine urine ratio  2. Paroxysmal atrial fibrillation (HCC) Heart is irregular but rate is controlled at 83. - POCT INR  3. Chronic diastolic congestive heart failure (HCC) Denies shortness of breath or dependent edema and blood pressure is satisfactory at 145/81  4. Essential hypertension Pressure controlled on amlodipine and benazepril  5. Paroxysmal a-fib ([place]) See above rate controlled and he is on Coumadin.

## 2015-11-08 LAB — MICROALBUMIN / CREATININE URINE RATIO
CREATININE, UR: 29.4 mg/dL
MICROALB/CREAT RATIO: 304.8 mg/g creat — ABNORMAL HIGH (ref 0.0–30.0)
Microalbumin, Urine: 89.6 ug/mL

## 2015-11-11 NOTE — Progress Notes (Signed)
Okay that is all we can do for now

## 2015-11-24 ENCOUNTER — Other Ambulatory Visit: Payer: Self-pay | Admitting: Family Medicine

## 2015-12-02 ENCOUNTER — Encounter (HOSPITAL_COMMUNITY): Payer: Commercial Managed Care - HMO
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
## 2015-12-02 NOTE — Assessment & Plan Note (Addendum)
Iron deficiency anemia secondary to chronic GI blood loss with positive stool cards and GI work-up showing [name] Ulcers/Linear in the setting of chronic anticoagulation with Vitamin K Antagonist.  Oncology Flowsheet 08/11/2015  ferumoxytol (FERAHEME) IV 510 mg    Labs today: CBC diff, CMET, ferritin  Labs every 8 weeks: CBC diff, CMET, ferritin  Labs every 4 months: renal function panel  No chronic renal disease identified and therefore use of ferric gluconate is not indicated..  Return in 4 months for follow-up.

## 2015-12-03 ENCOUNTER — Ambulatory Visit (HOSPITAL_COMMUNITY): Payer: Commercial Managed Care - HMO | Admitting: Oncology

## 2015-12-03 ENCOUNTER — Encounter (HOSPITAL_COMMUNITY): Payer: Commercial Managed Care - HMO | Attending: Oncology | Admitting: Oncology

## 2015-12-03 ENCOUNTER — Encounter (HOSPITAL_COMMUNITY): Payer: Self-pay | Admitting: Oncology

## 2015-12-03 ENCOUNTER — Encounter (HOSPITAL_COMMUNITY): Payer: Commercial Managed Care - HMO

## 2015-12-03 VITALS — BP 133/84 | HR 79 | Temp 98.3°F | Resp 18 | Wt 212.0 lb

## 2015-12-03 DIAGNOSIS — Z7901 Long term (current) use of anticoagulants: Secondary | ICD-10-CM

## 2015-12-03 DIAGNOSIS — T45515A Adverse effect of anticoagulants, initial encounter: Principal | ICD-10-CM

## 2015-12-03 DIAGNOSIS — K922 Gastrointestinal hemorrhage, unspecified: Secondary | ICD-10-CM

## 2015-12-03 DIAGNOSIS — D500 Iron deficiency anemia secondary to blood loss (chronic): Secondary | ICD-10-CM | POA: Diagnosis not present

## 2015-12-03 DIAGNOSIS — K259 Gastric ulcer, unspecified as acute or chronic, without hemorrhage or perforation: Secondary | ICD-10-CM

## 2015-12-03 DIAGNOSIS — D509 Iron deficiency anemia, unspecified: Secondary | ICD-10-CM

## 2015-12-03 DIAGNOSIS — D6832 Hemorrhagic disorder due to extrinsic circulating anticoagulants: Secondary | ICD-10-CM

## 2015-12-03 LAB — CBC WITH DIFFERENTIAL/PLATELET
BASOS PCT: 0 %
Basophils Absolute: 0 10*3/uL (ref 0.0–0.1)
EOS ABS: 0.2 10*3/uL (ref 0.0–0.7)
EOS PCT: 3 %
HCT: 38.8 % — ABNORMAL LOW (ref 39.0–52.0)
HEMOGLOBIN: 13 g/dL (ref 13.0–17.0)
LYMPHS ABS: 2 10*3/uL (ref 0.7–4.0)
Lymphocytes Relative: 41 %
MCH: 32.1 pg (ref 26.0–34.0)
MCHC: 33.5 g/dL (ref 30.0–36.0)
MCV: 95.8 fL (ref 78.0–100.0)
MONO ABS: 0.3 10*3/uL (ref 0.1–1.0)
MONOS PCT: 6 %
Neutro Abs: 2.3 10*3/uL (ref 1.7–7.7)
Neutrophils Relative %: 50 %
PLATELETS: 189 10*3/uL (ref 150–400)
RBC: 4.05 MIL/uL — ABNORMAL LOW (ref 4.22–5.81)
RDW: 13.9 % (ref 11.5–15.5)
WBC: 4.7 10*3/uL (ref 4.0–10.5)

## 2015-12-03 LAB — COMPREHENSIVE METABOLIC PANEL
ALK PHOS: 48 U/L (ref 38–126)
ALT: 29 U/L (ref 17–63)
ANION GAP: 8 (ref 5–15)
AST: 21 U/L (ref 15–41)
Albumin: 3.7 g/dL (ref 3.5–5.0)
BUN: 12 mg/dL (ref 6–20)
CALCIUM: 8.9 mg/dL (ref 8.9–10.3)
CO2: 27 mmol/L (ref 22–32)
Chloride: 106 mmol/L (ref 101–111)
Creatinine, Ser: 0.9 mg/dL (ref 0.61–1.24)
GFR calc non Af Amer: >60 mL/min (ref >60)
Glucose, Bld: 101 mg/dL — ABNORMAL HIGH (ref 65–99)
POTASSIUM: 4 mmol/L (ref 3.5–5.1)
SODIUM: 141 mmol/L (ref 135–145)
Total Bilirubin: 0.4 mg/dL (ref 0.3–1.2)
Total Protein: 6.6 g/dL (ref 6.5–8.1)

## 2015-12-03 LAB — IRON AND TIBC
IRON: 90 ug/dL (ref 45–182)
SATURATION RATIOS: 26 % (ref 17.9–39.5)
TIBC: 342 ug/dL (ref 250–450)
UIBC: 252 ug/dL

## 2015-12-03 LAB — FERRITIN: Ferritin: 132 ng/mL (ref 24–336)

## 2015-12-03 NOTE — Patient Instructions (Addendum)
[place] at [facility] Discharge Instructions  RECOMMENDATIONS MADE BY THE CONSULTANT AND ANY TEST RESULTS WILL BE SENT TO YOUR REFERRING PHYSICIAN.  Exam done and seen today by Kirby Crigler Labs look good, waiting on others,will call you with those results. Will need to get labs every 8 weeks Return to see the Doctor in 4 months Call the clinic for any concerns or questions.   Thank you for choosing [place] at [facility] to provide your oncology and hematology care.  To afford each patient quality time with our provider, please arrive at least 15 minutes before your scheduled appointment time.   Beginning January 23rd 2017 lab work for the [facility] will be done in the  Main lab at [facility] on 1st floor. If you have a lab appointment with the [facility] please come in thru the  Main Entrance and check in at the main information desk  You need to re-schedule your appointment should you arrive 10 or more minutes late.  We strive to give you quality time with our providers, and arriving late affects you and other patients whose appointments are after yours.  Also, if you no show three or more times for appointments you may be dismissed from the clinic at the providers discretion.     Again, thank you for choosing [facility].  Our hope is that these requests will decrease the amount of time that you wait before being seen by our physicians.       _____________________________________________________________  Should you have questions after your visit to [facility], please contact our office at [phone] between the hours of 8:30 a.m. and 4:30 p.m.  Voicemails left after 4:30 p.m. will not be returned until the following business day.  For prescription refill requests, have your pharmacy contact our office.         Resources For Cancer Patients and their Caregivers ? American Cancer Society: Can  assist with transportation, wigs, general needs, runs Look Good Feel Better.        702-527-5373 ? Cancer Care: Provides financial assistance, online support groups, medication/co-pay assistance.  1-800-813-HOPE 905-640-2904) ? [facility] Assists [place] cancer patients and their families through emotional , educational and financial support.  678-050-7304 ? [place] DSS Where to apply for food stamps, Medicaid and utility assistance. 862-366-5421 ? RCATS: Transportation to medical appointments. 978-536-3191 ? Social Security Administration: May apply for disability if have a Stage IV cancer. [phone] 432-327-5657 ? [employer], Disability and Transit Services: Assists with nutrition, care and transit needs. 6692200627

## 2015-12-08 ENCOUNTER — Ambulatory Visit (HOSPITAL_COMMUNITY): Payer: Commercial Managed Care - HMO | Admitting: Oncology

## 2015-12-10 ENCOUNTER — Ambulatory Visit (INDEPENDENT_AMBULATORY_CARE_PROVIDER_SITE_OTHER): Payer: Commercial Managed Care - HMO | Admitting: Pharmacist

## 2015-12-10 DIAGNOSIS — I480 Paroxysmal atrial fibrillation: Secondary | ICD-10-CM

## 2015-12-10 LAB — COAGUCHEK XS/INR WAIVED
INR: 2.6 — ABNORMAL HIGH (ref 0.9–1.1)
Prothrombin Time: 31.3 s

## 2015-12-10 NOTE — Patient Instructions (Signed)
Anticoagulation Dose Instructions as of 12/10/2015      Dorene Grebe Tue Wed Thu Fri Sat   New Dose 5 mg 2.5 mg 5 mg 5 mg 5 mg 2.5 mg 5 mg    Description        Continue current warfarin 5mg  dose of 1/2 tablet on mondays and fridays and 1 tablet all other days.     INR was 2.6 today

## 2015-12-16 ENCOUNTER — Encounter: Payer: Self-pay | Admitting: Gastroenterology

## 2015-12-24 ENCOUNTER — Other Ambulatory Visit (HOSPITAL_COMMUNITY): Payer: Commercial Managed Care - HMO

## 2016-01-07 ENCOUNTER — Other Ambulatory Visit: Payer: Self-pay | Admitting: Family Medicine

## 2016-01-14 ENCOUNTER — Other Ambulatory Visit (HOSPITAL_COMMUNITY): Payer: Commercial Managed Care - HMO

## 2016-01-21 ENCOUNTER — Encounter: Payer: Self-pay | Admitting: Pharmacist

## 2016-01-21 ENCOUNTER — Ambulatory Visit (INDEPENDENT_AMBULATORY_CARE_PROVIDER_SITE_OTHER): Payer: Commercial Managed Care - HMO | Admitting: Pharmacist

## 2016-01-21 DIAGNOSIS — I480 Paroxysmal atrial fibrillation: Secondary | ICD-10-CM | POA: Diagnosis not present

## 2016-01-21 LAB — COAGUCHEK XS/INR WAIVED
INR: 3.1 — AB (ref 0.9–1.1)
PROTHROMBIN TIME: 36.6 s

## 2016-01-21 NOTE — Patient Instructions (Signed)
Anticoagulation Dose Instructions as of 01/21/2016      Keith Doyle Tue Wed Thu Fri Sat   New Dose 5 mg 2.5 mg 5 mg 5 mg 5 mg 2.5 mg 5 mg    Description        No warfarin tomorrow - May 18th, 2017.  Continue current warfarin 5mg  dose of 1/2 tablet on mondays and fridays and 1 tablet all other days.     INR was 3.1 today

## 2016-01-28 ENCOUNTER — Other Ambulatory Visit (HOSPITAL_COMMUNITY): Payer: Self-pay | Admitting: Oncology

## 2016-01-28 ENCOUNTER — Encounter (HOSPITAL_COMMUNITY): Payer: Commercial Managed Care - HMO | Attending: Oncology

## 2016-01-28 DIAGNOSIS — D500 Iron deficiency anemia secondary to blood loss (chronic): Secondary | ICD-10-CM | POA: Insufficient documentation

## 2016-01-28 LAB — RENAL FUNCTION PANEL
ANION GAP: 6 (ref 5–15)
Albumin: 3.9 g/dL (ref 3.5–5.0)
BUN: 7 mg/dL (ref 6–20)
CALCIUM: 9.2 mg/dL (ref 8.9–10.3)
CO2: 27 mmol/L (ref 22–32)
CREATININE: 0.8 mg/dL (ref 0.61–1.24)
Chloride: 109 mmol/L (ref 101–111)
GFR calc non Af Amer: >60 mL/min (ref >60)
Glucose, Bld: 176 mg/dL — ABNORMAL HIGH (ref 65–99)
PHOSPHORUS: 3.2 mg/dL (ref 2.5–4.6)
Potassium: 3.3 mmol/L — ABNORMAL LOW (ref 3.5–5.1)
SODIUM: 142 mmol/L (ref 135–145)

## 2016-01-28 LAB — CBC WITH DIFFERENTIAL/PLATELET
BASOS ABS: 0 10*3/uL (ref 0.0–0.1)
BASOS PCT: 0 %
Eosinophils Absolute: 0.1 10*3/uL (ref 0.0–0.7)
Eosinophils Relative: 3 %
HEMATOCRIT: 36.2 % — AB (ref 39.0–52.0)
HEMOGLOBIN: 11.8 g/dL — AB (ref 13.0–17.0)
LYMPHS PCT: 38 %
Lymphs Abs: 1.8 10*3/uL (ref 0.7–4.0)
MCH: 31.1 pg (ref 26.0–34.0)
MCHC: 32.6 g/dL (ref 30.0–36.0)
MCV: 95.3 fL (ref 78.0–100.0)
MONO ABS: 0.3 10*3/uL (ref 0.1–1.0)
MONOS PCT: 7 %
NEUTROS ABS: 2.5 10*3/uL (ref 1.7–7.7)
NEUTROS PCT: 52 %
Platelets: 246 10*3/uL (ref 150–400)
RBC: 3.8 MIL/uL — ABNORMAL LOW (ref 4.22–5.81)
RDW: 15 % (ref 11.5–15.5)
WBC: 4.7 10*3/uL (ref 4.0–10.5)

## 2016-01-28 LAB — IRON AND TIBC
Iron: 48 ug/dL (ref 45–182)
Saturation Ratios: 13 % — ABNORMAL LOW (ref 17.9–39.5)
TIBC: 367 ug/dL (ref 250–450)
UIBC: 319 ug/dL

## 2016-01-28 LAB — FERRITIN: Ferritin: 81 ng/mL (ref 24–336)

## 2016-01-29 ENCOUNTER — Other Ambulatory Visit (HOSPITAL_COMMUNITY): Payer: Self-pay | Admitting: Oncology

## 2016-01-29 ENCOUNTER — Other Ambulatory Visit: Payer: Self-pay | Admitting: Family Medicine

## 2016-02-04 ENCOUNTER — Other Ambulatory Visit (HOSPITAL_COMMUNITY): Payer: Commercial Managed Care - HMO

## 2016-02-10 ENCOUNTER — Encounter (HOSPITAL_COMMUNITY): Payer: Commercial Managed Care - HMO | Attending: Oncology

## 2016-02-10 VITALS — BP 136/86 | HR 72 | Temp 98.1°F | Resp 18

## 2016-02-10 DIAGNOSIS — D500 Iron deficiency anemia secondary to blood loss (chronic): Secondary | ICD-10-CM | POA: Insufficient documentation

## 2016-02-10 DIAGNOSIS — K922 Gastrointestinal hemorrhage, unspecified: Secondary | ICD-10-CM | POA: Diagnosis not present

## 2016-02-10 MED ORDER — SODIUM CHLORIDE 0.9 % IV SOLN
510.0000 mg | Freq: Once | INTRAVENOUS | Status: AC
  Administered 2016-02-10: 510 mg via INTRAVENOUS
  Filled 2016-02-10: qty 17

## 2016-02-10 MED ORDER — SODIUM CHLORIDE 0.9 % IV SOLN
Freq: Once | INTRAVENOUS | Status: AC
  Administered 2016-02-10: 13:00:00 via INTRAVENOUS

## 2016-02-10 NOTE — Patient Instructions (Signed)
[facility] at [facility] Discharge Instructions  RECOMMENDATIONS MADE BY THE CONSULTANT AND ANY TEST RESULTS WILL BE SENT TO YOUR REFERRING PHYSICIAN.  IV iron infusion.    Thank you for choosing [facility] at [facility] to provide your oncology and hematology care.  To afford each patient quality time with our provider, please arrive at least 15 minutes before your scheduled appointment time.   Beginning January 23rd 2017 lab work for the Cancer Center will be done in the  Main lab at [facility] on 1st floor. If you have a lab appointment with the Cancer Center please come in thru the  Main Entrance and check in at the main information desk  You need to re-schedule your appointment should you arrive 10 or more minutes late.  We strive to give you quality time with our providers, and arriving late affects you and other patients whose appointments are after yours.  Also, if you no show three or more times for appointments you may be dismissed from the clinic at the providers discretion.     Again, thank you for choosing [facility].  Our hope is that these requests will decrease the amount of time that you wait before being seen by our physicians.       _____________________________________________________________  Should you have questions after your visit to  Cancer Center, please contact our office at [phone] between the hours of 8:30 a.m. and 4:30 p.m.  Voicemails left after 4:30 p.m. will not be returned until the following business day.  For prescription refill requests, have your pharmacy contact our office.         Resources For Cancer Patients and their Caregivers ? American Cancer Society: Can assist with transportation, wigs, general needs, runs Look Good Feel Better.        1-888-227-6333 ? Cancer Care: Provides financial assistance, online support groups, medication/co-pay assistance.   1-800-813-HOPE (4673) ? [facility] Assists [place] cancer patients and their families through emotional , educational and financial support.  336-427-4357 ? [place] DSS Where to apply for food stamps, Medicaid and utility assistance. 336-342-1394 ? RCATS: Transportation to medical appointments. 336-347-2287 ? Social Security Administration: May apply for disability if have a Stage IV cancer. 336-342-7796 1-800-772-1213 ? [place] Aging, Disability and Transit Services: Assists with nutrition, care and transit needs. 336-349-2343  Cancer Center Support Programs: @10RELATIVEDAYS@ > Cancer Support Group  2nd Tuesday of the month 1pm-2pm, Journey Room  > Creative Journey  3rd Tuesday of the month 1130am-1pm, Journey Room  > Look Good Feel Better  1st Wednesday of the month 10am-12 noon, Journey Room (Call American Cancer Society to register 1-800-395-5775)    

## 2016-02-10 NOTE — Progress Notes (Signed)
Patient tolerated infusion well.  VSS.   

## 2016-02-16 ENCOUNTER — Other Ambulatory Visit: Payer: Self-pay | Admitting: Family Medicine

## 2016-02-17 ENCOUNTER — Ambulatory Visit: Payer: Self-pay | Admitting: Family Medicine

## 2016-02-24 ENCOUNTER — Other Ambulatory Visit: Payer: Self-pay | Admitting: Family Medicine

## 2016-02-24 ENCOUNTER — Other Ambulatory Visit: Payer: Self-pay

## 2016-02-24 DIAGNOSIS — H8110 Benign paroxysmal vertigo, unspecified ear: Secondary | ICD-10-CM

## 2016-02-24 DIAGNOSIS — D500 Iron deficiency anemia secondary to blood loss (chronic): Secondary | ICD-10-CM

## 2016-02-24 MED ORDER — WARFARIN SODIUM 5 MG PO TABS: 2.5000 mg | ORAL_TABLET | Freq: Every day | ORAL | Status: DC

## 2016-02-25 ENCOUNTER — Other Ambulatory Visit (HOSPITAL_COMMUNITY): Payer: Commercial Managed Care - HMO

## 2016-03-01 ENCOUNTER — Encounter: Payer: Self-pay | Admitting: Family Medicine

## 2016-03-01 ENCOUNTER — Ambulatory Visit (INDEPENDENT_AMBULATORY_CARE_PROVIDER_SITE_OTHER): Payer: Commercial Managed Care - HMO | Admitting: Family Medicine

## 2016-03-01 VITALS — BP 124/79 | HR 62 | Temp 96.9°F | Ht 72.0 in | Wt 213.0 lb

## 2016-03-01 DIAGNOSIS — E1169 Type 2 diabetes mellitus with other specified complication: Secondary | ICD-10-CM

## 2016-03-01 DIAGNOSIS — I10 Essential (primary) hypertension: Secondary | ICD-10-CM | POA: Diagnosis not present

## 2016-03-01 DIAGNOSIS — E785 Hyperlipidemia, unspecified: Secondary | ICD-10-CM

## 2016-03-01 DIAGNOSIS — I480 Paroxysmal atrial fibrillation: Secondary | ICD-10-CM | POA: Diagnosis not present

## 2016-03-01 DIAGNOSIS — E1140 Type 2 diabetes mellitus with diabetic neuropathy, unspecified: Secondary | ICD-10-CM

## 2016-03-01 DIAGNOSIS — D649 Anemia, unspecified: Secondary | ICD-10-CM | POA: Diagnosis not present

## 2016-03-01 LAB — BAYER DCA HB A1C WAIVED: HB A1C: 6.4 % (ref <7.0)

## 2016-03-01 LAB — COAGUCHEK XS/INR WAIVED
INR: 2.1 — AB (ref 0.9–1.1)
PROTHROMBIN TIME: 25.3 s

## 2016-03-01 MED ORDER — TAMSULOSIN HCL 0.4 MG PO CAPS: 0.4000 mg | ORAL_CAPSULE | Freq: Every day | ORAL | Status: DC

## 2016-03-01 NOTE — Patient Instructions (Signed)
Medicare Annual Wellness Visit  [place] and the medical providers at [facility] strive to bring you the best medical care.  In doing so we not only want to address your current medical conditions and concerns but also to detect new conditions early and prevent illness, disease and health-related problems.    Medicare offers a yearly Wellness Visit which allows our clinical staff to assess your need for preventative services including immunizations, lifestyle education, counseling to decrease risk of preventable diseases and screening for fall risk and other medical concerns.    This visit is provided free of charge (no copay) for all Medicare recipients. The clinical pharmacists at [facility] have begun to conduct these Wellness Visits which will also include a thorough review of all your medications.    As you primary medical provider recommend that you make an appointment for your Annual Wellness Visit if you have not done so already this year.  You may set up this appointment before you leave today or you may call back [phone]) and schedule an appointment.  Please make sure when you call that you mention that you are scheduling your Annual Wellness Visit with the clinical pharmacist so that the appointment may be made for the proper length of time.     Continue current medications. Continue good therapeutic lifestyle changes which include good diet and exercise. Fall precautions discussed with patient. If an FOBT was given today- please return it to our front desk. If you are over 50 years old - you may need Prevnar 13 or the adult Pneumonia vaccine.  **Flu shots are available--- please call and schedule a FLU-CLINIC appointment**  After your visit with us today you will receive a survey in the mail or online from [employer] regarding your care with us. Please take a moment to fill this out. Your feedback is very  important to us as you can help us better understand your patient needs as well as improve your experience and satisfaction. WE CARE ABOUT YOU!!!    

## 2016-03-01 NOTE — Progress Notes (Signed)
Subjective:    Patient ID: Keith Doyle, male    DOB: [birth year], 80 y.o.   MRN: [id number]  HPI Pt here for follow up and management of chronic medical problems which includes diabetes, a fib and hyperlipidemia. He is taking medications regularly.  Blood sugars have been good at home and his last A1c 4 months ago was 6.0. He has 2 concerns today. First he has bilateral knee pain. We have injected his knees before the steroid which is effective for about 3 months. He also is concerned about some dribbling. Is hard to cut off urine after he has finished voiding. He also has nocturia having to get up about every hour to avoid. He may have had a TURP in the pas;t he is not sure     Patient Active Problem List   Diagnosis Date Noted  . Iron deficiency anemia due to chronic blood loss 07/30/2015  . Chronic anticoagulation   . Renal insufficiency 12/30/2014  . Multiple gastric ulcers 12/07/2014  . Colon polyps 12/07/2014  . Hemorrhoids 12/07/2014  . Warfarin-induced coagulopathy ([place]) 12/06/2014  . Bradycardia 12/06/2014  . Anemia due to chronic blood loss 12/05/2014  . AKI (acute kidney injury) ([place]) 12/05/2014  . Lumbar back pain 12/05/2014  . Paroxysmal a-fib ([place]) 12/05/2014  . Chronic diastolic congestive heart failure ([place]) 12/05/2014  . HLD (hyperlipidemia) 12/05/2014  . Back pain   . Paroxysmal atrial fibrillation ([place]) 10/03/2014  . Osteopenia of the elderly 10/03/2014  . Dizziness 06/20/2012  . Exertional dyspnea 06/20/2012  . Diabetes mellitus with neuropathy ([place]) 06/20/2012  . Influenza A 08/28/2011  . Hypokalemia 08/28/2011  . ARF (acute renal failure) ([place]) 08/28/2011  . BPH (benign prostatic hyperplasia) 11/28/2010  . DVT of lower extremity (deep venous thrombosis) ([place]) 11/28/2010  . Osteopenia 11/28/2010  . ED (erectile dysfunction) 11/28/2010  . Peripheral neuropathy ([place]) 11/28/2010  . Hyperlipidemia 11/28/2010  . OVERWEIGHT 01/02/2009  . CARDIOVASCULAR  FUNCTION STUDY, ABNORMAL 01/02/2009  . OTH NONSPECIFIC ABNORM CV SYSTEM FUNCTION STUDY 01/02/2009  . Essential hypertension 01/01/2009   Outpatient Encounter Prescriptions as of 03/01/2016  Medication Sig  . amLODipine (NORVASC) 10 MG tablet TAKE 1 TABLET EVERY DAY  . atorvastatin (LIPITOR) 40 MG tablet TAKE 1 TABLET EVERY DAY  . benazepril (LOTENSIN) 40 MG tablet TAKE 1 TABLET TWICE DAILY  . fenofibrate 160 MG tablet TAKE 1 TABLET EVERY DAY  . furosemide (LASIX) 20 MG tablet Take 1 tablet (20 mg total) by mouth 2 (two) times daily. NEED OV.  Marland Kitchen gabapentin (NEURONTIN) 400 MG capsule TAKE 2 CAPSULES TWICE DAILY  . glucose blood test strip Use to check blood glucose once daily.  Dx:  Type 2 diabetes E11.9  . Iron Polysacch Cmplx-B12-FA (FERREX 150 FORTE) 150-0.025-1 MG CAPS Take 1 capsule by mouth daily.  . meclizine (ANTIVERT) 50 MG tablet Take 1 tablet (50 mg total) by mouth 2 (two) times daily as needed.  . metFORMIN (GLUCOPHAGE-XR) 500 MG 24 hr tablet Take 1 tablet (500 mg total) by mouth daily. Take with a meal  . pantoprazole (PROTONIX) 40 MG tablet Take 1 tablet (40 mg total) by mouth 2 (two) times daily before a meal.  . potassium chloride SA (K-DUR,KLOR-CON) 20 MEQ tablet TAKE 1/2 TABLET TWICE DAILY AS NEEDED    WHEN  YOU  TAKE  LASIX ( FUROSEMIDE)  . Vitamins A & D 5000-400 UNITS CAPS 1 capsule daily.  Marland Kitchen warfarin (COUMADIN) 5 MG tablet Take 0.5-1 tablets (2.5-5 mg total)  by mouth daily. Take 1 tablet (5mg ) evry day except 1/2 tablet (2.5mg ) on Mon & Fri   No facility-administered encounter medications on file as of 03/01/2016.      Review of Systems  Constitutional: Negative.   HENT: Negative.   Eyes: Negative.   Respiratory: Negative.   Cardiovascular: Negative.   Gastrointestinal: Negative.   Endocrine: Negative.   Genitourinary: Negative.   Musculoskeletal: Positive for arthralgias (bilateral knee pain).  Skin: Negative.   Allergic/Immunologic: Negative.   Neurological:  Negative.   Hematological: Negative.   Psychiatric/Behavioral: Negative.        Objective:   Physical Exam  Constitutional: He is oriented to person, place, and time. He appears well-developed and well-nourished.  Cardiovascular: Normal rate and intact distal pulses.   Pulmonary/Chest: Effort normal and breath sounds normal.  Neurological: He is alert and oriented to person, place, and time.   knees were injected with Depo-Medrol and Marcaine using a lateral approach. Patient tolerated this well. There was no difficulties encountered.  BP 124/79 mmHg  Pulse 62  Temp(Src) 96.9 F (36.1 C) (Oral)  Ht 6' (1.829 m)  Wt 213 lb (96.616 kg)  BMI 28.88 kg/m2       Assessment & Plan:

## 2016-03-01 NOTE — Addendum Note (Signed)
Addended by: Juluis Rainier on: 03/01/2016 09:34 AM   Modules accepted: Orders

## 2016-03-02 LAB — CBC WITH DIFFERENTIAL/PLATELET
BASOS: 1 %
Basophils Absolute: 0 10*3/uL (ref 0.0–0.2)
EOS (ABSOLUTE): 0.1 10*3/uL (ref 0.0–0.4)
Eos: 2 %
Hematocrit: 36.6 % — ABNORMAL LOW (ref 37.5–51.0)
Hemoglobin: 12.2 g/dL — ABNORMAL LOW (ref 12.6–17.7)
IMMATURE GRANULOCYTES: 1 %
Immature Grans (Abs): 0 10*3/uL (ref 0.0–0.1)
Lymphocytes Absolute: 1.6 10*3/uL (ref 0.7–3.1)
Lymphs: 33 %
MCH: 31 pg (ref 26.6–33.0)
MCHC: 33.3 g/dL (ref 31.5–35.7)
MCV: 93 fL (ref 79–97)
MONOS ABS: 0.3 10*3/uL (ref 0.1–0.9)
Monocytes: 7 %
NEUTROS ABS: 2.8 10*3/uL (ref 1.4–7.0)
NEUTROS PCT: 56 %
PLATELETS: 254 10*3/uL (ref 150–379)
RBC: 3.94 x10E6/uL — ABNORMAL LOW (ref 4.14–5.80)
RDW: 15.7 % — AB (ref 12.3–15.4)
WBC: 4.9 10*3/uL (ref 3.4–10.8)

## 2016-03-02 LAB — CMP14+EGFR
A/G RATIO: 2.6 — AB (ref 1.2–2.2)
ALT: 19 IU/L (ref 0–44)
AST: 14 IU/L (ref 0–40)
Albumin: 4.2 g/dL (ref 3.5–4.8)
Alkaline Phosphatase: 43 IU/L (ref 39–117)
BUN/Creatinine Ratio: 8 — ABNORMAL LOW (ref 10–24)
BUN: 7 mg/dL — ABNORMAL LOW (ref 8–27)
Bilirubin Total: 0.4 mg/dL (ref 0.0–1.2)
CALCIUM: 9.3 mg/dL (ref 8.6–10.2)
CO2: 22 mmol/L (ref 18–29)
Chloride: 106 mmol/L (ref 96–106)
Creatinine, Ser: 0.89 mg/dL (ref 0.76–1.27)
GFR calc Af Amer: 95 mL/min/{1.73_m2} (ref >59)
GFR, EST NON AFRICAN AMERICAN: 82 mL/min/{1.73_m2} (ref >59)
GLOBULIN, TOTAL: 1.6 g/dL (ref 1.5–4.5)
Glucose: 129 mg/dL — ABNORMAL HIGH (ref 65–99)
POTASSIUM: 4.1 mmol/L (ref 3.5–5.2)
SODIUM: 147 mmol/L — AB (ref 134–144)
Total Protein: 5.8 g/dL — ABNORMAL LOW (ref 6.0–8.5)

## 2016-03-08 ENCOUNTER — Other Ambulatory Visit: Payer: Self-pay | Admitting: Pharmacist

## 2016-03-08 ENCOUNTER — Telehealth: Payer: Self-pay | Admitting: Family Medicine

## 2016-03-08 DIAGNOSIS — H8110 Benign paroxysmal vertigo, unspecified ear: Secondary | ICD-10-CM

## 2016-03-11 MED ORDER — MECLIZINE HCL 32 MG PO TABS: 32.0000 mg | ORAL_TABLET | Freq: Three times a day (TID) | ORAL | Status: DC | PRN

## 2016-03-11 NOTE — Addendum Note (Signed)
Addended by: [name] on: 03/11/2016 12:13 PM   Modules accepted: Orders, SmartSet

## 2016-03-17 ENCOUNTER — Other Ambulatory Visit (HOSPITAL_COMMUNITY): Payer: Commercial Managed Care - HMO

## 2016-03-24 ENCOUNTER — Other Ambulatory Visit (HOSPITAL_COMMUNITY): Payer: Commercial Managed Care - HMO

## 2016-03-24 ENCOUNTER — Ambulatory Visit (HOSPITAL_COMMUNITY): Payer: Commercial Managed Care - HMO | Admitting: Hematology & Oncology

## 2016-03-26 ENCOUNTER — Encounter (HOSPITAL_COMMUNITY): Payer: Commercial Managed Care - HMO

## 2016-03-26 ENCOUNTER — Encounter (HOSPITAL_COMMUNITY): Payer: Self-pay | Admitting: Hematology & Oncology

## 2016-03-26 ENCOUNTER — Encounter (HOSPITAL_COMMUNITY): Payer: Commercial Managed Care - HMO | Attending: Oncology | Admitting: Hematology & Oncology

## 2016-03-26 VITALS — BP 130/82 | HR 75 | Temp 98.2°F | Resp 18 | Wt 215.1 lb

## 2016-03-26 DIAGNOSIS — K259 Gastric ulcer, unspecified as acute or chronic, without hemorrhage or perforation: Secondary | ICD-10-CM | POA: Diagnosis not present

## 2016-03-26 DIAGNOSIS — Z7901 Long term (current) use of anticoagulants: Secondary | ICD-10-CM | POA: Diagnosis not present

## 2016-03-26 DIAGNOSIS — D500 Iron deficiency anemia secondary to blood loss (chronic): Secondary | ICD-10-CM

## 2016-03-26 LAB — RENAL FUNCTION PANEL
Albumin: 3.7 g/dL (ref 3.5–5.0)
Anion gap: 6 (ref 5–15)
BUN: 10 mg/dL (ref 6–20)
CHLORIDE: 108 mmol/L (ref 101–111)
CO2: 26 mmol/L (ref 22–32)
Calcium: 8.9 mg/dL (ref 8.9–10.3)
Creatinine, Ser: 1.01 mg/dL (ref 0.61–1.24)
Glucose, Bld: 128 mg/dL — ABNORMAL HIGH (ref 65–99)
POTASSIUM: 3.6 mmol/L (ref 3.5–5.1)
Phosphorus: 2.6 mg/dL (ref 2.5–4.6)
Sodium: 140 mmol/L (ref 135–145)

## 2016-03-26 LAB — CBC WITH DIFFERENTIAL/PLATELET
BASOS PCT: 1 %
Basophils Absolute: 0 10*3/uL (ref 0.0–0.1)
EOS ABS: 0.1 10*3/uL (ref 0.0–0.7)
EOS PCT: 2 %
HCT: 36.5 % — ABNORMAL LOW (ref 39.0–52.0)
Hemoglobin: 12.1 g/dL — ABNORMAL LOW (ref 13.0–17.0)
LYMPHS ABS: 2 10*3/uL (ref 0.7–4.0)
Lymphocytes Relative: 33 %
MCH: 32.4 pg (ref 26.0–34.0)
MCHC: 33.2 g/dL (ref 30.0–36.0)
MCV: 97.6 fL (ref 78.0–100.0)
Monocytes Absolute: 0.4 10*3/uL (ref 0.1–1.0)
Monocytes Relative: 7 %
Neutro Abs: 3.5 10*3/uL (ref 1.7–7.7)
Neutrophils Relative %: 57 %
PLATELETS: 221 10*3/uL (ref 150–400)
RBC: 3.74 MIL/uL — AB (ref 4.22–5.81)
RDW: 14.9 % (ref 11.5–15.5)
WBC: 6 10*3/uL (ref 4.0–10.5)

## 2016-03-26 LAB — IRON AND TIBC
IRON: 66 ug/dL (ref 45–182)
Saturation Ratios: 19 % (ref 17.9–39.5)
TIBC: 349 ug/dL (ref 250–450)
UIBC: 283 ug/dL

## 2016-03-26 LAB — FERRITIN: Ferritin: 103 ng/mL (ref 24–336)
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
## 2016-03-26 NOTE — Patient Instructions (Signed)
[place] at [facility] Discharge Instructions  RECOMMENDATIONS MADE BY THE CONSULTANT AND ANY TEST RESULTS WILL BE SENT TO YOUR REFERRING PHYSICIAN.  Exam done and seen today by Dr. Whitney Muse Labs every 6 weeks Return to see the doctor in 3 months with labs Will call with iron results Please call the clinic if you have any questions or concerns  Thank you for choosing [place] at [facility] to provide your oncology and hematology care.  To afford each patient quality time with our provider, please arrive at least 15 minutes before your scheduled appointment time.   Beginning January 23rd 2017 lab work for the [facility] will be done in the  Main lab at [facility] on 1st floor. If you have a lab appointment with the [facility] please come in thru the  Main Entrance and check in at the main information desk  You need to re-schedule your appointment should you arrive 10 or more minutes late.  We strive to give you quality time with our providers, and arriving late affects you and other patients whose appointments are after yours.  Also, if you no show three or more times for appointments you may be dismissed from the clinic at the providers discretion.     Again, thank you for choosing [facility].  Our hope is that these requests will decrease the amount of time that you wait before being seen by our physicians.       _____________________________________________________________  Should you have questions after your visit to [facility], please contact our office at [phone] between the hours of 8:30 a.m. and 4:30 p.m.  Voicemails left after 4:30 p.m. will not be returned until the following business day.  For prescription refill requests, have your pharmacy contact our office.         Resources For Cancer Patients and their Caregivers ? American Cancer Society: Can assist with transportation, wigs,  general needs, runs Look Good Feel Better.        843-871-4577 ? Cancer Care: Provides financial assistance, online support groups, medication/co-pay assistance.  1-800-813-HOPE 351-348-3058) ? [place] Assists [place] cancer patients and their families through emotional , educational and financial support.  [phone] ? [place] DSS Where to apply for food stamps, Medicaid and utility assistance. ([phone] ? RCATS: Transportation to medical appointments. 971-437-5107 ? Social Security Administration: May apply for disability if have a Stage IV cancer. 567-268-4838 [phone] ? [employer], Disability and Transit Services: Assists with nutrition, care and transit needs. [place] Support Programs: @10RELATIVEDAYS @ > Cancer Support Group  2nd Tuesday of the month 1pm-2pm, Journey Room  > Creative Journey  3rd Tuesday of the month 1130am-1pm, Journey Room  > Look Good Feel Better  1st Wednesday of the month 10am-12 noon, Journey Room (Call [place] to register [phone])

## 2016-04-05 ENCOUNTER — Other Ambulatory Visit: Payer: Self-pay | Admitting: Family Medicine

## 2016-04-09 ENCOUNTER — Encounter: Payer: Self-pay | Admitting: Pharmacist Clinician (PhC)/ Clinical Pharmacy Specialist

## 2016-04-16 ENCOUNTER — Ambulatory Visit (INDEPENDENT_AMBULATORY_CARE_PROVIDER_SITE_OTHER): Payer: Commercial Managed Care - HMO | Admitting: Pharmacist Clinician (PhC)/ Clinical Pharmacy Specialist

## 2016-04-16 DIAGNOSIS — I480 Paroxysmal atrial fibrillation: Secondary | ICD-10-CM

## 2016-04-16 LAB — COAGUCHEK XS/INR WAIVED
INR: 2.4 — ABNORMAL HIGH (ref 0.9–1.1)
PROTHROMBIN TIME: 29.1 s

## 2016-04-16 NOTE — Patient Instructions (Signed)
Anticoagulation Dose Instructions as of 04/16/2016      Keith Doyle Tue Wed Thu Fri Sat   New Dose 5 mg 2.5 mg 5 mg 5 mg 5 mg 2.5 mg 5 mg    Description   Continue current warfarin 5mg  dose of 1/2 tablet on mondays and fridays and 1 tablet all other days.  INR today 2.4

## 2016-04-22 ENCOUNTER — Encounter (HOSPITAL_COMMUNITY): Payer: Self-pay | Admitting: Hematology & Oncology

## 2016-05-01 ENCOUNTER — Other Ambulatory Visit: Payer: Self-pay | Admitting: Family Medicine

## 2016-05-07 ENCOUNTER — Encounter (HOSPITAL_COMMUNITY): Payer: Commercial Managed Care - HMO | Attending: Hematology & Oncology

## 2016-05-07 DIAGNOSIS — D500 Iron deficiency anemia secondary to blood loss (chronic): Secondary | ICD-10-CM | POA: Diagnosis not present

## 2016-05-07 LAB — CBC WITH DIFFERENTIAL/PLATELET
Basophils Absolute: 0 10*3/uL (ref 0.0–0.1)
Basophils Relative: 0 %
Eosinophils Absolute: 0.1 10*3/uL (ref 0.0–0.7)
Eosinophils Relative: 2 %
HEMATOCRIT: 36.2 % — AB (ref 39.0–52.0)
HEMOGLOBIN: 12.1 g/dL — AB (ref 13.0–17.0)
LYMPHS ABS: 1.8 10*3/uL (ref 0.7–4.0)
Lymphocytes Relative: 31 %
MCH: 32.4 pg (ref 26.0–34.0)
MCHC: 33.4 g/dL (ref 30.0–36.0)
MCV: 96.8 fL (ref 78.0–100.0)
MONO ABS: 0.3 10*3/uL (ref 0.1–1.0)
MONOS PCT: 6 %
NEUTROS ABS: 3.6 10*3/uL (ref 1.7–7.7)
NEUTROS PCT: 61 %
Platelets: 204 10*3/uL (ref 150–400)
RBC: 3.74 MIL/uL — ABNORMAL LOW (ref 4.22–5.81)
RDW: 15.2 % (ref 11.5–15.5)
WBC: 5.8 10*3/uL (ref 4.0–10.5)

## 2016-05-07 LAB — IRON AND TIBC
Iron: 54 ug/dL (ref 45–182)
Saturation Ratios: 17 % — ABNORMAL LOW (ref 17.9–39.5)
TIBC: 325 ug/dL (ref 250–450)
UIBC: 271 ug/dL

## 2016-05-07 LAB — FERRITIN: FERRITIN: 104 ng/mL (ref 24–336)

## 2016-05-17 ENCOUNTER — Other Ambulatory Visit: Payer: Self-pay | Admitting: Family Medicine

## 2016-05-17 DIAGNOSIS — D500 Iron deficiency anemia secondary to blood loss (chronic): Secondary | ICD-10-CM

## 2016-05-25 ENCOUNTER — Encounter: Payer: Self-pay | Admitting: Family Medicine

## 2016-05-25 ENCOUNTER — Ambulatory Visit (INDEPENDENT_AMBULATORY_CARE_PROVIDER_SITE_OTHER): Payer: Commercial Managed Care - HMO | Admitting: Family Medicine

## 2016-05-25 VITALS — BP 112/73 | HR 86 | Temp 97.3°F | Ht 72.0 in | Wt 216.0 lb

## 2016-05-25 DIAGNOSIS — I10 Essential (primary) hypertension: Secondary | ICD-10-CM

## 2016-05-25 DIAGNOSIS — N400 Enlarged prostate without lower urinary tract symptoms: Secondary | ICD-10-CM | POA: Diagnosis not present

## 2016-05-25 DIAGNOSIS — D500 Iron deficiency anemia secondary to blood loss (chronic): Secondary | ICD-10-CM | POA: Diagnosis not present

## 2016-05-25 DIAGNOSIS — R350 Frequency of micturition: Secondary | ICD-10-CM | POA: Diagnosis not present

## 2016-05-25 LAB — MICROSCOPIC EXAMINATION
Bacteria, UA: NONE SEEN
RBC, UA: NONE SEEN /hpf (ref 0–?)
WBC UA: NONE SEEN /HPF (ref 0–?)

## 2016-05-25 LAB — URINALYSIS, COMPLETE
BILIRUBIN UA: NEGATIVE
GLUCOSE, UA: NEGATIVE
Ketones, UA: NEGATIVE
Leukocytes, UA: NEGATIVE
Nitrite, UA: NEGATIVE
PROTEIN UA: NEGATIVE
RBC UA: NEGATIVE
Specific Gravity, UA: 1.01 (ref 1.005–1.030)
UUROB: 0.2 mg/dL (ref 0.2–1.0)
pH, UA: 6 (ref 5.0–7.5)

## 2016-05-25 NOTE — Addendum Note (Signed)
Addended by: Zannie Cove on: 05/25/2016 02:27 PM   Modules accepted: Orders

## 2016-05-25 NOTE — Progress Notes (Signed)
Subjective:    Patient ID: Keith Doyle, male    DOB: 1936-04-03, 80 y.o.   MRN: [id number]  HPI Patient here today for edema, Bowel issues and urine frequency. He is accompanied today by his son.  Keith Doyle also complains of some fatigue and shortness of breath with exertion. In reviewing his medicines he takes 10 mg of amlodipine. Blood pressure has been good recently and we could probably reduce that dose. He also complains of some diarrhea Thinks it may be related to the iron he is taking. He also complains of some urinary frequency and dribbling and nocturia. I think all his symptoms are probably related to enlarged prostate. By history he has had TURP in the past.     Patient Active Problem List   Diagnosis Date Noted  . Iron deficiency anemia due to chronic blood loss 07/30/2015  . Chronic anticoagulation   . Renal insufficiency 12/30/2014  . Multiple gastric ulcers 12/07/2014  . Colon polyps 12/07/2014  . Hemorrhoids 12/07/2014  . Warfarin-induced coagulopathy ([place]) 12/06/2014  . Bradycardia 12/06/2014  . Anemia due to chronic blood loss 12/05/2014  . AKI (acute kidney injury) ([place]) 12/05/2014  . Lumbar back pain 12/05/2014  . Paroxysmal a-fib ([place]) 12/05/2014  . Chronic diastolic congestive heart failure ([place]) 12/05/2014  . HLD (hyperlipidemia) 12/05/2014  . Back pain   . Paroxysmal atrial fibrillation ([place]) 10/03/2014  . Osteopenia of the elderly 10/03/2014  . Dizziness 06/20/2012  . Exertional dyspnea 06/20/2012  . Diabetes mellitus with neuropathy ([place]) 06/20/2012  . Influenza A 08/28/2011  . Hypokalemia 08/28/2011  . ARF (acute renal failure) ([place]) 08/28/2011  . BPH (benign prostatic hyperplasia) 11/28/2010  . DVT of lower extremity (deep venous thrombosis) ([place]) 11/28/2010  . Osteopenia 11/28/2010  . ED (erectile dysfunction) 11/28/2010  . Peripheral neuropathy ([place]) 11/28/2010  . Hyperlipidemia 11/28/2010  . OVERWEIGHT 01/02/2009  . CARDIOVASCULAR FUNCTION  STUDY, ABNORMAL 01/02/2009  . OTH NONSPECIFIC ABNORM CV SYSTEM FUNCTION STUDY 01/02/2009  . Essential hypertension 01/01/2009   Outpatient Encounter Prescriptions as of 05/25/2016  Medication Sig  . Acetaminophen 500 MG coapsule   . amLODipine (NORVASC) 10 MG tablet TAKE 1 TABLET EVERY DAY  . atorvastatin (LIPITOR) 40 MG tablet TAKE 1 TABLET EVERY DAY  . benazepril (LOTENSIN) 40 MG tablet TAKE 1 TABLET TWICE DAILY  . fenofibrate 160 MG tablet TAKE 1 TABLET EVERY DAY  . furosemide (LASIX) 20 MG tablet Take 1 tablet (20 mg total) by mouth 2 (two) times daily. NEED OV.  Marland Kitchen gabapentin (NEURONTIN) 400 MG capsule TAKE 2 CAPSULES TWICE DAILY  . glucose blood test strip Use to check blood glucose once daily.  Dx:  Type 2 diabetes E11.9  . Iron Polysacch Cmplx-B12-FA (FERREX 150 FORTE) 150-0.025-1 MG CAPS Take 1 capsule by mouth daily.  . meclizine (ANTIVERT) 25 MG tablet TAKE 1 TABLET THREE TIMES DAILY AS NEEDED  FOR  DIZZINESS  . metFORMIN (GLUCOPHAGE-XR) 500 MG 24 hr tablet TAKE 1 TABLET (500 MG TOTAL) BY MOUTH DAILY. TAKE WITH A MEAL  . pantoprazole (PROTONIX) 40 MG tablet Take 1 tablet (40 mg total) by mouth 2 (two) times daily before a meal.  . potassium chloride SA (K-DUR,KLOR-CON) 20 MEQ tablet TAKE 1/2 TABLET TWICE DAILY AS NEEDED    WHEN  YOU  TAKE  LASIX ( FUROSEMIDE)  . tamsulosin (FLOMAX) 0.4 MG CAPS capsule Take 1 capsule (0.4 mg total) by mouth daily after breakfast.  . Vitamins A & D 5000-400 UNITS CAPS 1  capsule daily.  Marland Kitchen warfarin (COUMADIN) 5 MG tablet TAKE 1 TABLET EVERY DAY EXCEPT 1/2 TABLET ON MONDAY AND FRIDAY AS DIRECTED   No facility-administered encounter medications on file as of 05/25/2016.      Review of Systems  Constitutional: Positive for fatigue.  HENT: Negative.   Eyes: Negative.   Respiratory: Negative.   Cardiovascular: Positive for leg swelling.  Gastrointestinal: Positive for constipation.  Endocrine: Negative.   Genitourinary: Positive for frequency.    Musculoskeletal: Negative.   Skin: Negative.   Allergic/Immunologic: Negative.   Neurological: Negative.   Hematological: Negative.   Psychiatric/Behavioral: Negative.        Objective:   Physical Exam  Constitutional: He is oriented to person, place, and time. He appears well-developed and well-nourished.  Cardiovascular: Normal rate and regular rhythm.   Pulmonary/Chest: Effort normal. He has no rales.  Abdominal: Soft. Bowel sounds are normal. There is no tenderness.  Neurological: He is alert and oriented to person, place, and time.   BP 112/73 (BP Location: Right Arm)   Pulse 86   Temp 97.3 F (36.3 C) (Oral)   Ht 6' (1.829 m)   Wt 216 lb (98 kg)   BMI 29.29 kg/m         Assessment & Plan:  1. Urinary frequency I suspect frequency is related to BPH but will check for infection - Urinalysis, Complete - Urine culture  2. Essential hypertension As he complains of edema, will reduce amlodipine from 10 mg to 5 mg  3. BPH (benign prostatic hyperplasia) I had tried him on Flomax at last visit he saw no change in symptoms  4. Anemia due to chronic blood loss If hemoglobin is normal believe stopping the iron will help his problems with loose stools.  [name] MD

## 2016-05-26 LAB — CBC WITH DIFFERENTIAL/PLATELET
Basophils Absolute: 0 10*3/uL (ref 0.0–0.2)
Basos: 1 %
EOS (ABSOLUTE): 0.1 10*3/uL (ref 0.0–0.4)
EOS: 2 %
HEMATOCRIT: 36.3 % — AB (ref 37.5–51.0)
Hemoglobin: 12.5 g/dL — ABNORMAL LOW (ref 12.6–17.7)
IMMATURE GRANULOCYTES: 1 %
Immature Grans (Abs): 0 10*3/uL (ref 0.0–0.1)
LYMPHS ABS: 2.1 10*3/uL (ref 0.7–3.1)
Lymphs: 32 %
MCH: 32.1 pg (ref 26.6–33.0)
MCHC: 34.4 g/dL (ref 31.5–35.7)
MCV: 93 fL (ref 79–97)
MONOS ABS: 0.4 10*3/uL (ref 0.1–0.9)
Monocytes: 6 %
NEUTROS PCT: 58 %
Neutrophils Absolute: 3.8 10*3/uL (ref 1.4–7.0)
PLATELETS: 259 10*3/uL (ref 150–379)
RBC: 3.89 x10E6/uL — AB (ref 4.14–5.80)
RDW: 14.6 % (ref 12.3–15.4)
WBC: 6.4 10*3/uL (ref 3.4–10.8)

## 2016-05-26 LAB — BMP8+EGFR
BUN / CREAT RATIO: 8 — AB (ref 10–24)
BUN: 7 mg/dL — ABNORMAL LOW (ref 8–27)
CO2: 25 mmol/L (ref 18–29)
Calcium: 9.4 mg/dL (ref 8.6–10.2)
Chloride: 102 mmol/L (ref 96–106)
Creatinine, Ser: 0.91 mg/dL (ref 0.76–1.27)
GFR calc Af Amer: 93 mL/min/{1.73_m2} (ref >59)
GFR calc non Af Amer: 80 mL/min/{1.73_m2} (ref >59)
GLUCOSE: 146 mg/dL — AB (ref 65–99)
POTASSIUM: 3.9 mmol/L (ref 3.5–5.2)
SODIUM: 141 mmol/L (ref 134–144)

## 2016-05-28 ENCOUNTER — Encounter: Payer: Self-pay | Admitting: Pharmacist Clinician (PhC)/ Clinical Pharmacy Specialist

## 2016-05-29 LAB — URINE CULTURE

## 2016-05-31 ENCOUNTER — Encounter: Payer: Self-pay | Admitting: Family Medicine

## 2016-06-02 ENCOUNTER — Other Ambulatory Visit: Payer: Self-pay | Admitting: Family Medicine

## 2016-06-02 ENCOUNTER — Other Ambulatory Visit: Payer: Self-pay | Admitting: Nurse Practitioner

## 2016-06-02 DIAGNOSIS — K259 Gastric ulcer, unspecified as acute or chronic, without hemorrhage or perforation: Secondary | ICD-10-CM

## 2016-06-03 ENCOUNTER — Ambulatory Visit (INDEPENDENT_AMBULATORY_CARE_PROVIDER_SITE_OTHER): Payer: Commercial Managed Care - HMO | Admitting: Pharmacist

## 2016-06-03 DIAGNOSIS — I480 Paroxysmal atrial fibrillation: Secondary | ICD-10-CM | POA: Diagnosis not present

## 2016-06-03 LAB — COAGUCHEK XS/INR WAIVED
INR: 3.5 — ABNORMAL HIGH (ref 0.9–1.1)
PROTHROMBIN TIME: 41.5 s

## 2016-06-04 ENCOUNTER — Other Ambulatory Visit: Payer: Self-pay | Admitting: *Deleted

## 2016-06-04 MED ORDER — CIPROFLOXACIN HCL 500 MG PO TABS: 500.0000 mg | ORAL_TABLET | Freq: Two times a day (BID) | ORAL | 0 refills | Status: DC

## 2016-06-17 ENCOUNTER — Ambulatory Visit (INDEPENDENT_AMBULATORY_CARE_PROVIDER_SITE_OTHER): Payer: Commercial Managed Care - HMO | Admitting: Pharmacist

## 2016-06-17 DIAGNOSIS — I480 Paroxysmal atrial fibrillation: Secondary | ICD-10-CM | POA: Diagnosis not present

## 2016-06-17 LAB — COAGUCHEK XS/INR WAIVED
INR: 3.5 — ABNORMAL HIGH (ref 0.9–1.1)
Prothrombin Time: 41.8 s

## 2016-06-25 ENCOUNTER — Encounter (HOSPITAL_COMMUNITY): Payer: Self-pay | Admitting: Oncology

## 2016-06-25 ENCOUNTER — Encounter (HOSPITAL_COMMUNITY): Payer: Commercial Managed Care - HMO

## 2016-06-25 ENCOUNTER — Encounter (HOSPITAL_COMMUNITY): Payer: Commercial Managed Care - HMO | Attending: Oncology | Admitting: Oncology

## 2016-06-25 VITALS — BP 111/62 | HR 67 | Temp 98.8°F | Resp 16 | Wt 218.4 lb

## 2016-06-25 DIAGNOSIS — K922 Gastrointestinal hemorrhage, unspecified: Secondary | ICD-10-CM | POA: Diagnosis not present

## 2016-06-25 DIAGNOSIS — D500 Iron deficiency anemia secondary to blood loss (chronic): Secondary | ICD-10-CM | POA: Insufficient documentation

## 2016-06-25 LAB — CBC WITH DIFFERENTIAL/PLATELET
BASOS ABS: 0 10*3/uL (ref 0.0–0.1)
Basophils Relative: 1 %
EOS PCT: 2 %
Eosinophils Absolute: 0.1 10*3/uL (ref 0.0–0.7)
HCT: 34.9 % — ABNORMAL LOW (ref 39.0–52.0)
HEMOGLOBIN: 11.6 g/dL — AB (ref 13.0–17.0)
LYMPHS ABS: 2 10*3/uL (ref 0.7–4.0)
LYMPHS PCT: 37 %
MCH: 31.6 pg (ref 26.0–34.0)
MCHC: 33.2 g/dL (ref 30.0–36.0)
MCV: 95.1 fL (ref 78.0–100.0)
Monocytes Absolute: 0.3 10*3/uL (ref 0.1–1.0)
Monocytes Relative: 6 %
NEUTROS ABS: 2.9 10*3/uL (ref 1.7–7.7)
NEUTROS PCT: 54 %
PLATELETS: 245 10*3/uL (ref 150–400)
RBC: 3.67 MIL/uL — AB (ref 4.22–5.81)
RDW: 13.5 % (ref 11.5–15.5)
WBC: 5.4 10*3/uL (ref 4.0–10.5)

## 2016-06-25 LAB — IRON AND TIBC
Iron: 40 ug/dL — ABNORMAL LOW (ref 45–182)
SATURATION RATIOS: 11 % — AB (ref 17.9–39.5)
TIBC: 357 ug/dL (ref 250–450)
UIBC: 317 ug/dL

## 2016-06-25 LAB — FERRITIN: FERRITIN: 68 ng/mL (ref 24–336)

## 2016-06-25 NOTE — Patient Instructions (Signed)
[place] at [facility] Discharge Instructions  RECOMMENDATIONS MADE BY THE CONSULTANT AND ANY TEST RESULTS WILL BE SENT TO YOUR REFERRING PHYSICIAN.  You saw Kirby Crigler, PA-C, today. Labs in 6 weeks. Follow up and labs in 12 weeks.  Thank you for choosing [place] at [facility] to provide your oncology and hematology care.  To afford each patient quality time with our provider, please arrive at least 15 minutes before your scheduled appointment time.   Beginning January 23rd 2017 lab work for the [facility] will be done in the  Main lab at [facility] on 1st floor. If you have a lab appointment with the [facility] please come in thru the  Main Entrance and check in at the main information desk  You need to re-schedule your appointment should you arrive 10 or more minutes late.  We strive to give you quality time with our providers, and arriving late affects you and other patients whose appointments are after yours.  Also, if you no show three or more times for appointments you may be dismissed from the clinic at the providers discretion.     Again, thank you for choosing [facility].  Our hope is that these requests will decrease the amount of time that you wait before being seen by our physicians.       _____________________________________________________________  Should you have questions after your visit to [facility], please contact our office at [phone] between the hours of 8:30 a.m. and 4:30 p.m.  Voicemails left after 4:30 p.m. will not be returned until the following business day.  For prescription refill requests, have your pharmacy contact our office.         Resources For Cancer Patients and their Caregivers ? American Cancer Society: Can assist with transportation, wigs, general needs, runs Look Good Feel Better.        949-400-5831 ? Cancer Care: Provides financial assistance,  online support groups, medication/co-pay assistance.  1-800-813-HOPE 631-503-3615) ? [place] Assists [place] cancer patients and their families through emotional , educational and financial support.  504-820-4357 ? [place] DSS Where to apply for food stamps, Medicaid and utility assistance. 914-127-5994 ? RCATS: Transportation to medical appointments. 623-618-3119 ? Social Security Administration: May apply for disability if have a Stage IV cancer. 231-180-7125 [phone] ? [employer], Disability and Transit Services: Assists with nutrition, care and transit needs. [place] Support Programs: @10RELATIVEDAYS @ > Cancer Support Group  2nd Tuesday of the month 1pm-2pm, Journey Room  > Creative Journey  3rd Tuesday of the month 1130am-1pm, Journey Room  > Look Good Feel Better  1st Wednesday of the month 10am-12 noon, Journey Room (Call [place] to register [phone])
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
## 2016-06-25 NOTE — Assessment & Plan Note (Signed)
Iron deficiency anemia secondary to chronic GI blood loss with positive stool cards and GI work-up showing [name] Ulcers/Linear in the setting of chronic anticoagulation with Vitamin K Antagonist.  Labs today: CBC diff, iron/TIBC, ferritin  Labs in 6 weeks: CBC diff, renal function panel, ferritin  Labs in 12 weeks: CBC diff, renal function panel, iron/TIBC, ferritin  Return in 3 months for follow-up.

## 2016-06-28 ENCOUNTER — Other Ambulatory Visit (HOSPITAL_COMMUNITY): Payer: Self-pay | Admitting: Oncology

## 2016-07-02 ENCOUNTER — Ambulatory Visit (INDEPENDENT_AMBULATORY_CARE_PROVIDER_SITE_OTHER): Payer: Commercial Managed Care - HMO | Admitting: Family Medicine

## 2016-07-02 ENCOUNTER — Ambulatory Visit (INDEPENDENT_AMBULATORY_CARE_PROVIDER_SITE_OTHER): Payer: Commercial Managed Care - HMO | Admitting: Pharmacist Clinician (PhC)/ Clinical Pharmacy Specialist

## 2016-07-02 ENCOUNTER — Encounter: Payer: Self-pay | Admitting: Family Medicine

## 2016-07-02 VITALS — BP 135/84 | HR 78 | Temp 97.1°F | Ht 72.0 in | Wt 213.0 lb

## 2016-07-02 DIAGNOSIS — M25561 Pain in right knee: Secondary | ICD-10-CM

## 2016-07-02 DIAGNOSIS — E1140 Type 2 diabetes mellitus with diabetic neuropathy, unspecified: Secondary | ICD-10-CM

## 2016-07-02 DIAGNOSIS — I480 Paroxysmal atrial fibrillation: Secondary | ICD-10-CM

## 2016-07-02 DIAGNOSIS — Z23 Encounter for immunization: Secondary | ICD-10-CM | POA: Diagnosis not present

## 2016-07-02 DIAGNOSIS — E7800 Pure hypercholesterolemia, unspecified: Secondary | ICD-10-CM

## 2016-07-02 DIAGNOSIS — M25562 Pain in left knee: Secondary | ICD-10-CM

## 2016-07-02 DIAGNOSIS — I10 Essential (primary) hypertension: Secondary | ICD-10-CM | POA: Diagnosis not present

## 2016-07-02 LAB — COAGUCHEK XS/INR WAIVED
INR: 4.6 — AB (ref 0.9–1.1)
Prothrombin Time: 55 s

## 2016-07-02 LAB — BAYER DCA HB A1C WAIVED: HB A1C (BAYER DCA - WAIVED): 7.3 % — ABNORMAL HIGH (ref <7.0)

## 2016-07-02 MED ORDER — METHYLPREDNISOLONE ACETATE 80 MG/ML IJ SUSP
80.0000 mg | Freq: Once | INTRAMUSCULAR | Status: AC
  Administered 2016-07-02: 80 mg via INTRAMUSCULAR

## 2016-07-02 NOTE — Patient Instructions (Signed)
Anticoagulation Dose Instructions as of 07/02/2016      Keith Doyle Tue Wed Thu Fri Sat   New Dose 5 mg 2.5 mg 5 mg 2.5 mg 5 mg 2.5 mg 5 mg    Description   No warfarin Saturday or Sunday. Then take 1/2 tablet on Monday and recheck on Tuesday.  INR today 4.6 (too thin)

## 2016-07-02 NOTE — Addendum Note (Signed)
Addended by: Michaela Corner on: 07/02/2016 09:09 AM   Modules accepted: Orders

## 2016-07-02 NOTE — Progress Notes (Signed)
Subjective:    Patient ID: Keith Doyle, male    DOB: May 24, 1936, 80 y.o.   MRN: [id number]  HPI   Patient is here today for four month followup of his chronic medical conditions including diabetes, hypertension and hyperlipidemia.  Patient has no other complaints today. Actually he is requesting injections in his knees he has degenerative arthritis and gets injections every 4-6 months. Last injection was 6 months ago. His sugars have been doing good at home. Last A1c was 6.4. At his last visit we reduced his amlodipine from 10 mg to 5 mg. He notes that his swelling has improved. Blood pressure is still good at 135/84. He is due for another iron infusion in the hospital. Last hemoglobin was in the 11 range.   Patient Active Problem List   Diagnosis Date Noted  . Iron deficiency anemia due to chronic blood loss 07/30/2015  . Chronic anticoagulation   . Renal insufficiency 12/30/2014  . Multiple gastric ulcers 12/07/2014  . Colon polyps 12/07/2014  . Hemorrhoids 12/07/2014  . Warfarin-induced coagulopathy ([place]) 12/06/2014  . Bradycardia 12/06/2014  . Anemia due to chronic blood loss 12/05/2014  . AKI (acute kidney injury) ([place]) 12/05/2014  . Lumbar back pain 12/05/2014  . Paroxysmal a-fib ([place]) 12/05/2014  . Chronic diastolic congestive heart failure ([place]) 12/05/2014  . HLD (hyperlipidemia) 12/05/2014  . Back pain   . Paroxysmal atrial fibrillation ([place]) 10/03/2014  . Osteopenia of the elderly 10/03/2014  . Dizziness 06/20/2012  . Exertional dyspnea 06/20/2012  . Diabetes mellitus with neuropathy ([place]) 06/20/2012  . Influenza A 08/28/2011  . Hypokalemia 08/28/2011  . ARF (acute renal failure) ([place]) 08/28/2011  . BPH (benign prostatic hyperplasia) 11/28/2010  . DVT of lower extremity (deep venous thrombosis) ([place]) 11/28/2010  . Osteopenia 11/28/2010  . ED (erectile dysfunction) 11/28/2010  . Peripheral neuropathy ([place]) 11/28/2010  . Hyperlipidemia 11/28/2010  . OVERWEIGHT  01/02/2009  . CARDIOVASCULAR FUNCTION STUDY, ABNORMAL 01/02/2009  . OTH NONSPECIFIC ABNORM CV SYSTEM FUNCTION STUDY 01/02/2009  . Essential hypertension 01/01/2009   Outpatient Encounter Prescriptions as of 07/02/2016  Medication Sig  . Acetaminophen 500 MG coapsule   . amLODipine (NORVASC) 10 MG tablet TAKE 1 TABLET EVERY DAY  . atorvastatin (LIPITOR) 40 MG tablet TAKE 1 TABLET EVERY DAY  . benazepril (LOTENSIN) 40 MG tablet TAKE 1 TABLET TWICE DAILY  . ciprofloxacin (CIPRO) 500 MG tablet Take 1 tablet (500 mg total) by mouth 2 (two) times daily.  . fenofibrate 160 MG tablet TAKE 1 TABLET EVERY DAY  . furosemide (LASIX) 20 MG tablet Take 1 tablet (20 mg total) by mouth 2 (two) times daily. NEED OV.  [place] gabapentin (NEURONTIN) 400 MG capsule TAKE 2 CAPSULES TWICE DAILY  . glucose blood test strip Use to check blood glucose once daily.  Dx:  Type 2 diabetes E11.9  . Iron Polysacch Cmplx-B12-FA (FERREX 150 FORTE) 150-0.025-1 MG CAPS Take 1 capsule by mouth daily.  . meclizine (ANTIVERT) 25 MG tablet TAKE 1 TABLET THREE TIMES DAILY AS NEEDED  FOR  DIZZINESS  . metFORMIN (GLUCOPHAGE-XR) 500 MG 24 hr tablet TAKE 1 TABLET (500 MG TOTAL) BY MOUTH DAILY. TAKE WITH A MEAL  . pantoprazole (PROTONIX) 40 MG tablet TAKE 1 TABLET TWICE DAILY  BEFORE  MEAL  . potassium chloride SA (K-DUR,KLOR-CON) 20 MEQ tablet TAKE 1/2 TABLET TWICE DAILY AS NEEDED    WHEN  YOU  TAKE  LASIX ( FUROSEMIDE)  . Vitamins A & D 5000-400 UNITS CAPS 1 capsule  daily.  . warfarin (COUMADIN) 5 MG tablet TAKE 1 TABLET EVERY DAY EXCEPT 1/2 TABLET ON MONDAY AND FRIDAY AS DIRECTED  . tamsulosin (FLOMAX) 0.4 MG CAPS capsule Take 1 capsule (0.4 mg total) by mouth daily after breakfast. (Patient not taking: Reported on 07/02/2016)   No facility-administered encounter medications on file as of 07/02/2016.      Review of Systems  Constitutional: Negative.   HENT: Negative.   Eyes: Negative.   Respiratory: Negative.   Cardiovascular:  Negative.   Gastrointestinal: Negative.   Endocrine: Negative.   Genitourinary: Negative.   Musculoskeletal: Negative.   Skin: Negative.   Allergic/Immunologic: Negative.   Neurological: Negative.   Hematological: Negative.   Psychiatric/Behavioral: Negative.        Objective:   Physical Exam  Constitutional: He is oriented to person, place, and time. He appears well-developed and well-nourished.  Cardiovascular: Normal rate, regular rhythm and normal heart sounds.   Pulmonary/Chest: Effort normal.  Musculoskeletal:  Knees have effusions bilaterally. Landmarks were prepped in both knees were injected with Depo-Medrol and Marcaine after skin prep with alcohol area he tolerated this well.  Neurological: He is alert and oriented to person, place, and time.  Psychiatric: He has a normal mood and affect. His behavior is normal.    BP 135/84   Pulse 78   Temp 97.1 F (36.2 C) (Oral)   Ht 6' (1.829 m)   Wt 213 lb (96.6 kg)   BMI 28.89 kg/m         Assessment & Plan:    1. Pure hypercholesterolemia Lipids were at goal when last assessed earlier this year. We'll repeat lipids in early 2018 - Bayer DCA Hb A1c Waived - Protime-INR  2. Essential hypertension Pressure is still controlled after reducing amlodipine from 10 mg to 5 mg - Bayer DCA Hb A1c Waived - Protime-INR  3. Type 2 diabetes mellitus with diabetic neuropathy, without long-term current use of insulin (HCC) Diabetes has been well controlled to this point do not anticipate any change - Bayer DCA Hb A1c Waived - Protime-INR  4. Need for vaccination Flu shot administered  5. Pain in both knees, unspecified chronicity As were injected as described in the exam - methylPREDNISolone acetate (DEPO-MEDROL) injection 80 mg; Inject 1 mL (80 mg total) into the muscle once.  6. Paroxysmal a-fib (HCC) Heart is in regular rhythm today. Pro times are therapeutic       [name] MD

## 2016-07-02 NOTE — Patient Instructions (Signed)
Medicare Annual Wellness Visit  [facility] and the medical providers at [facility] strive to bring you the best medical care.  In doing so we not only want to address your current medical conditions and concerns but also to detect new conditions early and prevent illness, disease and health-related problems.    Medicare offers a yearly Wellness Visit which allows our clinical staff to assess your need for preventative services including immunizations, lifestyle education, counseling to decrease risk of preventable diseases and screening for fall risk and other medical concerns.    This visit is provided free of charge (no copay) for all Medicare recipients. The clinical pharmacists at [facility] have begun to conduct these Wellness Visits which will also include a thorough review of all your medications.    As you primary medical provider recommend that you make an appointment for your Annual Wellness Visit if you have not done so already this year.  You may set up this appointment before you leave today or you may call back [phone]) and schedule an appointment.  Please make sure when you call that you mention that you are scheduling your Annual Wellness Visit with the clinical pharmacist so that the appointment may be made for the proper length of time.    

## 2016-07-06 ENCOUNTER — Ambulatory Visit (INDEPENDENT_AMBULATORY_CARE_PROVIDER_SITE_OTHER): Payer: Commercial Managed Care - HMO | Admitting: Pharmacist

## 2016-07-06 DIAGNOSIS — I480 Paroxysmal atrial fibrillation: Secondary | ICD-10-CM

## 2016-07-06 LAB — COAGUCHEK XS/INR WAIVED
INR: 1.5 — ABNORMAL HIGH (ref 0.9–1.1)
Prothrombin Time: 17.4 s

## 2016-07-06 NOTE — Progress Notes (Signed)
Subjective:     Indication: atrial fibrillation Bleeding signs/symptoms: None Thromboembolic signs/symptoms: None  Missed Coumadin doses: This week - misses 2 doses due to elevated INR on 07/02/16 Medication changes: no Dietary changes: yes - increase in green leafty vegetables Bacterial/viral infection: no Other concerns: no   Objective:    INR Today: 1.5 Current dose: was warfarin 5mg  daily except 2.5mg  MWF    Assessment:    Subtherapeutic INR for goal of 2-3   Plan:    1. New dose: change to 5mg  MF and 2.5mg  all other days.   2. Next INR: 2 weeks - patient refused earlier appt.

## 2016-07-07 ENCOUNTER — Encounter (HOSPITAL_COMMUNITY): Payer: Self-pay

## 2016-07-07 ENCOUNTER — Encounter (HOSPITAL_COMMUNITY): Payer: Commercial Managed Care - HMO | Attending: Hematology & Oncology

## 2016-07-07 VITALS — BP 96/56 | HR 78 | Temp 97.8°F | Resp 18

## 2016-07-07 DIAGNOSIS — D500 Iron deficiency anemia secondary to blood loss (chronic): Secondary | ICD-10-CM | POA: Diagnosis not present

## 2016-07-07 DIAGNOSIS — K922 Gastrointestinal hemorrhage, unspecified: Secondary | ICD-10-CM | POA: Diagnosis not present

## 2016-07-07 MED ORDER — SODIUM CHLORIDE 0.9 % IV SOLN
510.0000 mg | Freq: Once | INTRAVENOUS | Status: AC
  Administered 2016-07-07: 510 mg via INTRAVENOUS
  Filled 2016-07-07: qty 17

## 2016-07-07 MED ORDER — SODIUM CHLORIDE 0.9 % IV SOLN
Freq: Once | INTRAVENOUS | Status: AC
  Administered 2016-07-07: 14:00:00 via INTRAVENOUS

## 2016-07-07 NOTE — Patient Instructions (Signed)
[facility] at [facility] Discharge Instructions  RECOMMENDATIONS MADE BY THE CONSULTANT AND ANY TEST RESULTS WILL BE SENT TO YOUR REFERRING PHYSICIAN.  Iron infusion today. Return as scheduled for lab work and office visit.  Thank you for choosing [facility] at [facility] to provide your oncology and hematology care.  To afford each patient quality time with our provider, please arrive at least 15 minutes before your scheduled appointment time.   Beginning January 23rd 2017 lab work for the Cancer Center will be done in the  Main lab at [facility] on 1st floor. If you have a lab appointment with the Cancer Center please come in thru the  Main Entrance and check in at the main information desk  You need to re-schedule your appointment should you arrive 10 or more minutes late.  We strive to give you quality time with our providers, and arriving late affects you and other patients whose appointments are after yours.  Also, if you no show three or more times for appointments you may be dismissed from the clinic at the providers discretion.     Again, thank you for choosing [facility].  Our hope is that these requests will decrease the amount of time that you wait before being seen by our physicians.       _____________________________________________________________  Should you have questions after your visit to  Cancer Center, please contact our office at [phone] between the hours of 8:30 a.m. and 4:30 p.m.  Voicemails left after 4:30 p.m. will not be returned until the following business day.  For prescription refill requests, have your pharmacy contact our office.         Resources For Cancer Patients and their Caregivers ? American Cancer Society: Can assist with transportation, wigs, general needs, runs Look Good Feel Better.        1-888-227-6333 ? Cancer Care: Provides financial assistance, online  support groups, medication/co-pay assistance.  1-800-813-HOPE (4673) ? [facility] Assists [place] cancer patients and their families through emotional , educational and financial support.  336-427-4357 ? [place] DSS Where to apply for food stamps, Medicaid and utility assistance. 336-342-1394 ? [facility]: Transportation to medical appointments. 336-347-2287 ? Social Security Administration: May apply for disability if have a Stage IV cancer. 336-342-7796 1-800-772-1213 ? [place] Aging, Disability and Transit Services: Assists with nutrition, care and transit needs. 336-349-2343  Cancer Center Support Programs: @10RELATIVEDAYS@ > Cancer Support Group  2nd Tuesday of the month 1pm-2pm, Journey Room  > Creative Journey  3rd Tuesday of the month 1130am-1pm, Journey Room  > Look Good Feel Better  1st Wednesday of the month 10am-12 noon, Journey Room (Call American Cancer Society to register 1-800-395-5775)   

## 2016-07-07 NOTE — Progress Notes (Signed)
Tolerated infusion w/o adverse reaction.  Alert, in no distress.  VSS.  Discharged via wheelchair in c/o daughter.   

## 2016-07-08 ENCOUNTER — Encounter: Payer: Self-pay | Admitting: Pharmacist

## 2016-07-16 ENCOUNTER — Ambulatory Visit (INDEPENDENT_AMBULATORY_CARE_PROVIDER_SITE_OTHER): Payer: Commercial Managed Care - HMO | Admitting: Pediatrics

## 2016-07-16 ENCOUNTER — Encounter: Payer: Self-pay | Admitting: Pediatrics

## 2016-07-16 VITALS — BP 152/82 | HR 85 | Temp 97.5°F | Ht 72.0 in | Wt 205.2 lb

## 2016-07-16 DIAGNOSIS — R42 Dizziness and giddiness: Secondary | ICD-10-CM | POA: Diagnosis not present

## 2016-07-16 DIAGNOSIS — R739 Hyperglycemia, unspecified: Secondary | ICD-10-CM

## 2016-07-16 DIAGNOSIS — R350 Frequency of micturition: Secondary | ICD-10-CM

## 2016-07-16 LAB — GLUCOSE HEMOCUE WAIVED
GLU HEMOCUE WAIVED: 417 mg/dL — AB (ref 65–99)
Glu Hemocue Waived: 392 mg/dL — ABNORMAL HIGH (ref 65–99)

## 2016-07-16 LAB — URINALYSIS
Bilirubin, UA: NEGATIVE
Ketones, UA: NEGATIVE
LEUKOCYTES UA: NEGATIVE
NITRITE UA: NEGATIVE
PH UA: 6.5 (ref 5.0–7.5)
Protein, UA: NEGATIVE
RBC, UA: NEGATIVE
Specific Gravity, UA: 1.005 (ref 1.005–1.030)
UUROB: 0.2 mg/dL (ref 0.2–1.0)

## 2016-07-16 LAB — COAGUCHEK XS/INR WAIVED
INR: 2.5 — AB (ref 0.9–1.1)
Prothrombin Time: 30.4 s

## 2016-07-16 LAB — FINGERSTICK HEMOGLOBIN: Hemoglobin: 12.1 g/dL — ABNORMAL LOW (ref 12.6–17.7)

## 2016-07-16 MED ORDER — INSULIN ASPART 100 UNIT/ML ~~LOC~~ SOLN
5.0000 [IU] | Freq: Once | SUBCUTANEOUS | Status: AC
  Administered 2016-07-16: 5 [IU] via SUBCUTANEOUS

## 2016-07-16 NOTE — Progress Notes (Signed)
  Subjective:   Patient ID: [name], male    DOB: 1935/12/18, 80 y.o.   MRN: [id number] CC: Dizziness  HPI: LYNNE RIGHI is a 80 y.o. male presenting for Dizziness  Thinks his hg is low Has to walk slowly for the past week Has low energy Has had some green stools, no blood in stools H/o afib, chronic GI bleed, gets regular iron infusions, last 10 days ago Has had urinary urgency and frequency he says for months Does have some dysuria at times No fevers, appetite has been down Last few weeks has been feeling too tired/weak to go dancing on weekends as he usually does  Relevant past medical, surgical, family and social history reviewed. Allergies and medications reviewed and updated. History  Smoking Status  . Never Smoker  Smokeless Tobacco  . Never Used    Comment: does not smoke   ROS: Per HPI   Objective:    BP (!) 152/82   Pulse 85   Temp 97.5 F (36.4 C) (Oral)   Ht 6' (1.829 m)   Wt 205 lb 3.2 oz (93.1 kg)   BMI 27.83 kg/m   Wt Readings from Last 3 Encounters:  07/16/16 205 lb 3.2 oz (93.1 kg)  07/02/16 213 lb (96.6 kg)  06/25/16 218 lb 6.4 oz (99.1 kg)  orthostatics: Lying systolic 409/81, HR 81 Sitting 126/81, HR 82 Standing 115/75, HR 67  Gen: NAD, alert, cooperative with exam, NCAT EYES: EOMI, no conjunctival injection, or no icterus ENT:  OP without erythema LYMPH: no cervical LAD CV: NRRR, normal S1/S2 Resp: CTABL, no wheezes, normal WOB Abd: +BS, soft, NTND. no guarding or organomegaly Ext: No edema, warm Neuro: Alert and oriented MSK: normal muscle bulk  Assessment & Plan:   Strummer was seen today for feeling weak. INR 2.5. Fingerstick Hg 12.1. Orthostatics wnl. UA with 4+ glucose, no ketones. Fingerstick glucose 396.  Gave 5u novolog. Rechecked in an hour, glucose 420, pt had been drinking regular coke when elevated BGL found. Additional 5u insulin given. Pt says he is feeling better. Pt's grandson came to take pt home. Pt going to stay with  family tonight. Family going to help check BGL in an hour, if elevated over 350 give additional 3 units insulin, recheck BGL in an hour, repeat. Any worsening in symptoms, needs to be seen in ED. No sugary beverages tonight. Drink plenty of water. BMP as below.  Pt does have DM2. Most recent A1c from 2 weeks ago 7.3. On metformin alone. RTC tomorrow for recheck.  Diagnoses and all orders for this visit:  Dizziness and giddiness -     Fingerstick Hemoglobin -     CoaguChek XS/INR Waived -     CBC with Differential/Platelet -     BMP8+EGFR -     Glucose Hemocue Waived -     Glucose Hemocue Waived  Urinary frequency -     Urinalysis -     Urine culture  Blood glucose elevated -     insulin aspart (novoLOG) injection 5 Units; Inject 0.05 mLs (5 Units total) into the skin once. -     Glucose Hemocue Waived -     insulin aspart (novoLOG) injection 5 Units; Inject 0.05 mLs (5 Units total) into the skin once.  Follow up plan: tomorrow Assunta Found, MD [place]

## 2016-07-16 NOTE — Patient Instructions (Signed)
Check Blood sugar in 1 hour and if over 350 at that time - give 3 units of Novolog  Be here at 9:00 am for recheck

## 2016-07-17 ENCOUNTER — Ambulatory Visit (INDEPENDENT_AMBULATORY_CARE_PROVIDER_SITE_OTHER): Payer: Commercial Managed Care - HMO | Admitting: Pediatrics

## 2016-07-17 VITALS — BP 118/75 | HR 77 | Temp 96.8°F | Ht 72.0 in | Wt 207.0 lb

## 2016-07-17 DIAGNOSIS — E1140 Type 2 diabetes mellitus with diabetic neuropathy, unspecified: Secondary | ICD-10-CM | POA: Diagnosis not present

## 2016-07-17 DIAGNOSIS — R739 Hyperglycemia, unspecified: Secondary | ICD-10-CM | POA: Diagnosis not present

## 2016-07-17 LAB — CBC WITH DIFFERENTIAL/PLATELET
BASOS: 1 %
Basophils Absolute: 0.1 10*3/uL (ref 0.0–0.2)
EOS (ABSOLUTE): 0.3 10*3/uL (ref 0.0–0.4)
EOS: 4 %
HEMATOCRIT: 38.1 % (ref 37.5–51.0)
Hemoglobin: 12.8 g/dL (ref 12.6–17.7)
IMMATURE GRANS (ABS): 0 10*3/uL (ref 0.0–0.1)
Immature Granulocytes: 0 %
LYMPHS: 36 %
Lymphocytes Absolute: 2.1 10*3/uL (ref 0.7–3.1)
MCH: 31.4 pg (ref 26.6–33.0)
MCHC: 33.6 g/dL (ref 31.5–35.7)
MCV: 94 fL (ref 79–97)
Monocytes Absolute: 0.3 10*3/uL (ref 0.1–0.9)
Monocytes: 5 %
NEUTROS ABS: 3.2 10*3/uL (ref 1.4–7.0)
Neutrophils: 54 %
PLATELETS: 222 10*3/uL (ref 150–379)
RBC: 4.07 x10E6/uL — ABNORMAL LOW (ref 4.14–5.80)
RDW: 14.7 % (ref 12.3–15.4)
WBC: 5.9 10*3/uL (ref 3.4–10.8)

## 2016-07-17 LAB — BMP8+EGFR
BUN / CREAT RATIO: 13 (ref 10–24)
BUN: 14 mg/dL (ref 8–27)
CALCIUM: 9.6 mg/dL (ref 8.6–10.2)
CHLORIDE: 97 mmol/L (ref 96–106)
CO2: 25 mmol/L (ref 18–29)
CREATININE: 1.07 mg/dL (ref 0.76–1.27)
GFR, EST AFRICAN AMERICAN: 76 mL/min/{1.73_m2} (ref >59)
GFR, EST NON AFRICAN AMERICAN: 66 mL/min/{1.73_m2} (ref >59)
Glucose: 389 mg/dL — ABNORMAL HIGH (ref 65–99)
Potassium: 3.9 mmol/L (ref 3.5–5.2)
Sodium: 140 mmol/L (ref 134–144)

## 2016-07-17 MED ORDER — ONETOUCH ULTRASOFT LANCETS MISC: 12 refills | Status: DC

## 2016-07-17 MED ORDER — METFORMIN HCL ER 500 MG PO TB24: 500.0000 mg | ORAL_TABLET | Freq: Two times a day (BID) | ORAL | 0 refills | Status: DC

## 2016-07-17 MED ORDER — SITAGLIPTIN PHOSPHATE 50 MG PO TABS: 50.0000 mg | ORAL_TABLET | Freq: Every day | ORAL | 1 refills | Status: DC

## 2016-07-17 NOTE — Patient Instructions (Addendum)
If blood sugar level over 350, take 3 units of novolog Check before meals for the next few days and anytime you feel bad  Start januvia 50mg  once a day  Increase metformin to 500mg  tab morning and night

## 2016-07-17 NOTE — Progress Notes (Signed)
  Subjective:   Patient ID: Keith Doyle, male    DOB: 11-02-1935, 80 y.o.   MRN: [id number] CC: Recheck BS  HPI: Keith Doyle is a 80 y.o. male presenting for Recheck BS  Labs from yesterday with normal CO2, pt not acidotic Says he is feeling back to normal now Had BGL in 300s, low of 216 last night after leaving clinic Needed two additional doses of 3u novolog at home before BGL under 350  Has been drinking a lot drinks from [employer] with lots of sugar, regular sodas Takes metformin regularly  Relevant past medical, surgical, family and social history reviewed. Allergies and medications reviewed and updated. History  Smoking Status  . Never Smoker  Smokeless Tobacco  . Never Used    Comment: does not smoke   ROS: Per HPI   Objective:    BP 118/75   Pulse 77   Temp (!) 96.8 F (36 C) (Oral)   Ht 6' (1.829 m)   Wt 207 lb (93.9 kg)   BMI 28.07 kg/m   Wt Readings from Last 3 Encounters:  07/17/16 207 lb (93.9 kg)  07/16/16 205 lb 3.2 oz (93.1 kg)  07/02/16 213 lb (96.6 kg)    Gen: NAD, alert, cooperative with exam, NCAT EYES: EOMI, no conjunctival injection, or no icterus CV: NRRR, normal S1/S2, no murmur, distal pulses 2+ b/l Resp: CTABL, no wheezes, normal WOB Abd: +BS, soft, NTND. no guarding or organomegaly Ext: No edema, warm Neuro: Alert and oriented  Assessment & Plan:  Keith Doyle was seen today for recheck bs. Still elevated today, 416, pt feeling much better Discussed cutting out all sugary drinks, may drink water and diet drinks Check BGL before meals for next couple of days and anytime he feels bad Take 3 units of novolog if blood sugar level over 350 Discussed I do not think he will need insulin long term, just until we get his BGL improved Start januvia Increase metformin RTC 1 week  Diagnoses and all orders for this visit:  Blood glucose elevated -     Glucose Hemocue Waived  Type 2 diabetes mellitus with diabetic neuropathy, unspecified long term  insulin use status (HCC) -     metFORMIN (GLUCOPHAGE-XR) 500 MG 24 hr tablet; Take 1 tablet (500 mg total) by mouth 2 (two) times daily. -     Lancets (ONETOUCH ULTRASOFT) lancets; Use as instructed -     sitaGLIPtin (JANUVIA) 50 MG tablet; Take 1 tablet (50 mg total) by mouth daily.  Follow up plan: Return in about 1 week (around 07/24/2016). Assunta Found, MD [place]

## 2016-07-18 LAB — URINE CULTURE

## 2016-07-19 ENCOUNTER — Telehealth: Payer: Self-pay | Admitting: *Deleted

## 2016-07-19 LAB — GLUCOSE HEMOCUE WAIVED: Glu Hemocue Waived: 416 mg/dL (ref 65–99)

## 2016-07-19 NOTE — Telephone Encounter (Signed)
Called patient to see how he is feeling.  Patient states that he is feeling better.  BS readings.  11/12-346      11/13-316 4am     11/13-367-6am      11/13-338-8am      11/13-418-1045am      11/13-376-1115am      11/13-355-2pm Appt made to see Dr. Evette Doffing on 07/22/16 at 300pm and patient aware to go to ED if he gets dizzy or not feeling any better.  Patient states that BP reading is 103/79 HR 78

## 2016-07-22 ENCOUNTER — Encounter: Payer: Self-pay | Admitting: Pediatrics

## 2016-07-22 ENCOUNTER — Ambulatory Visit (INDEPENDENT_AMBULATORY_CARE_PROVIDER_SITE_OTHER): Payer: Commercial Managed Care - HMO | Admitting: Pediatrics

## 2016-07-22 ENCOUNTER — Ambulatory Visit (INDEPENDENT_AMBULATORY_CARE_PROVIDER_SITE_OTHER): Payer: Commercial Managed Care - HMO | Admitting: Pharmacist

## 2016-07-22 VITALS — BP 108/67 | HR 76 | Temp 97.2°F | Ht 72.0 in | Wt 212.4 lb

## 2016-07-22 DIAGNOSIS — I480 Paroxysmal atrial fibrillation: Secondary | ICD-10-CM

## 2016-07-22 DIAGNOSIS — E1140 Type 2 diabetes mellitus with diabetic neuropathy, unspecified: Secondary | ICD-10-CM

## 2016-07-22 LAB — COAGUCHEK XS/INR WAIVED
INR: 1.8 — AB (ref 0.9–1.1)
PROTHROMBIN TIME: 21.9 s

## 2016-07-22 NOTE — Progress Notes (Signed)
  Subjective:   Patient ID: Keith Doyle, male    DOB: 04-Feb-1936, 80 y.o.   MRN: [id number] CC: Follow-up (Blood sugar)  HPI: Keith Doyle is a 80 y.o. male presenting for Follow-up (Blood sugar)  AM BGLs sometimes down to mid-200s, occasionally still 300s-400s Did receive depomedrol shot for arthritis end of October Has received in the past for ongoing OA symptoms in his knees Possibly cause of elevated BGLs Feeling well No more lightheadedness, dizziness  Relevant past medical, surgical, family and social history reviewed. Allergies and medications reviewed and updated. History  Smoking Status  . Never Smoker  Smokeless Tobacco  . Never Used    Comment: does not smoke   ROS: Per HPI   Objective:    BP 108/67   Pulse 76   Temp 97.2 F (36.2 C) (Oral)   Ht 6' (1.829 m)   Wt 212 lb 6.4 oz (96.3 kg)   BMI 28.81 kg/m   Wt Readings from Last 3 Encounters:  07/22/16 212 lb 6.4 oz (96.3 kg)  07/17/16 207 lb (93.9 kg)  07/16/16 205 lb 3.2 oz (93.1 kg)    Gen: NAD, alert, cooperative with exam, NCAT EYES: EOMI, no conjunctival injection, or no icterus CV: NRRR, normal S1/S2 Resp: CTABL, no wheezes, normal WOB Abd: +BS, soft, NTND. no guarding or organomegaly Ext: No edema, warm Neuro: Alert and oriented  Assessment & Plan:  Keith Doyle was seen today for follow-up multiple med problems.  Diagnoses and all orders for this visit:  Type 2 diabetes mellitus with diabetic neuropathy, unspecified long term insulin use status ([place]) Feeling well May need to avoid steroid injections in future Cont metformin, januvia Cont to check BGLs If regularly over 300 let us know Follow up 4 weeks with PCP  Follow up plan: 4 weeks Assunta Found, MD [place]

## 2016-08-02 ENCOUNTER — Encounter: Payer: Self-pay | Admitting: Pharmacist

## 2016-08-06 ENCOUNTER — Encounter (HOSPITAL_COMMUNITY): Payer: Commercial Managed Care - HMO | Attending: Hematology & Oncology

## 2016-08-06 DIAGNOSIS — D500 Iron deficiency anemia secondary to blood loss (chronic): Secondary | ICD-10-CM | POA: Insufficient documentation

## 2016-08-06 LAB — CBC WITH DIFFERENTIAL/PLATELET
BASOS ABS: 0 10*3/uL (ref 0.0–0.1)
Basophils Relative: 1 %
EOS ABS: 0.5 10*3/uL (ref 0.0–0.7)
EOS PCT: 7 %
HCT: 38.6 % — ABNORMAL LOW (ref 39.0–52.0)
Hemoglobin: 12.8 g/dL — ABNORMAL LOW (ref 13.0–17.0)
LYMPHS ABS: 1.8 10*3/uL (ref 0.7–4.0)
LYMPHS PCT: 29 %
MCH: 31.8 pg (ref 26.0–34.0)
MCHC: 33.2 g/dL (ref 30.0–36.0)
MCV: 95.8 fL (ref 78.0–100.0)
MONO ABS: 0.4 10*3/uL (ref 0.1–1.0)
Monocytes Relative: 6 %
Neutro Abs: 3.7 10*3/uL (ref 1.7–7.7)
Neutrophils Relative %: 57 %
PLATELETS: 203 10*3/uL (ref 150–400)
RBC: 4.03 MIL/uL — AB (ref 4.22–5.81)
RDW: 14.5 % (ref 11.5–15.5)
WBC: 6.5 10*3/uL (ref 4.0–10.5)

## 2016-08-06 LAB — RENAL FUNCTION PANEL
Albumin: 3.9 g/dL (ref 3.5–5.0)
Anion gap: 6 (ref 5–15)
BUN: 11 mg/dL (ref 6–20)
CALCIUM: 9.3 mg/dL (ref 8.9–10.3)
CHLORIDE: 104 mmol/L (ref 101–111)
CO2: 28 mmol/L (ref 22–32)
CREATININE: 0.99 mg/dL (ref 0.61–1.24)
GFR calc non Af Amer: >60 mL/min (ref >60)
Glucose, Bld: 145 mg/dL — ABNORMAL HIGH (ref 65–99)
Phosphorus: 3.2 mg/dL (ref 2.5–4.6)
Potassium: 3.6 mmol/L (ref 3.5–5.1)
SODIUM: 138 mmol/L (ref 135–145)

## 2016-08-06 LAB — FERRITIN: Ferritin: 100 ng/mL (ref 24–336)

## 2016-08-07 ENCOUNTER — Other Ambulatory Visit: Payer: Self-pay | Admitting: Family Medicine

## 2016-08-07 ENCOUNTER — Other Ambulatory Visit: Payer: Self-pay | Admitting: Cardiology

## 2016-08-09 ENCOUNTER — Telehealth: Payer: Self-pay | Admitting: Family Medicine

## 2016-08-09 NOTE — Telephone Encounter (Signed)
Nurse wanted to let us know she spoke with Josph Macho today to follow up on his care.  She is concerned about how he is taking his medications.  She reports he told her that he sometimes takes his Benazepril once daily and sometimes twice daily; his Metformin ER he had lost the prescription and just recently found it.  While it was lost he was taking his regular Metformin only once per day.  She also feels he may need more education about how he eats and how it affects his PT/INR.  It may be possible that Barnett Applebaum could get [employer] out to see him to help him with his medications.

## 2016-08-11 ENCOUNTER — Telehealth: Payer: Self-pay | Admitting: Family Medicine

## 2016-08-11 NOTE — Telephone Encounter (Signed)
Patient must have an office visit for a face to face, then we can order

## 2016-08-11 NOTE — Telephone Encounter (Signed)
Pt notified of results Verbalizes understanding 

## 2016-08-12 ENCOUNTER — Ambulatory Visit (INDEPENDENT_AMBULATORY_CARE_PROVIDER_SITE_OTHER): Payer: Commercial Managed Care - HMO | Admitting: Pediatrics

## 2016-08-12 VITALS — BP 124/73 | HR 77 | Temp 97.3°F | Ht 72.0 in | Wt 209.2 lb

## 2016-08-12 DIAGNOSIS — I480 Paroxysmal atrial fibrillation: Secondary | ICD-10-CM | POA: Diagnosis not present

## 2016-08-12 DIAGNOSIS — I5032 Chronic diastolic (congestive) heart failure: Secondary | ICD-10-CM

## 2016-08-12 DIAGNOSIS — I10 Essential (primary) hypertension: Secondary | ICD-10-CM | POA: Diagnosis not present

## 2016-08-12 NOTE — Progress Notes (Signed)
  Subjective:   Patient ID: Keith Doyle, male    DOB: 1935-12-01, 80 y.o.   MRN: [id number] CC: Referral (Home Health)  HPI: Keith Doyle is a 80 y.o. male presenting for Referral ([place])  [employer] nurse called, concerned he is not takin ghis medications as prescribed at home Seemed confused when she was doing home visit, takes metformin sometimes once, sometimes twice a day Takes benazepril sometimes once, sometimes twice a day per her report  Pt says he is feeling well  DM2: BGLs much improved Last two weeks readings in 100s One 201  Swelling: present in LE Comes and goes Takes lasix twice a day Takes K supplement with the lasix  Relevant past medical, surgical, family and social history reviewed. Allergies and medications reviewed and updated. History  Smoking Status  . Never Smoker  Smokeless Tobacco  . Never Used    Comment: does not smoke   ROS: Per HPI   Objective:    BP 124/73   Pulse 77   Temp 97.3 F (36.3 C) (Oral)   Ht 6' (1.829 m)   Wt 209 lb 3.2 oz (94.9 kg)   BMI 28.37 kg/m   Wt Readings from Last 3 Encounters:  08/12/16 209 lb 3.2 oz (94.9 kg)  07/22/16 212 lb 6.4 oz (96.3 kg)  07/17/16 207 lb (93.9 kg)    Gen: NAD, alert, cooperative with exam, NCAT EYES: EOMI, no conjunctival injection, or no icterus CV: normal rate, normal S1/S2 Resp: CTABL, no wheezes, normal WOB Ext: trace pitting edema, warm Neuro: Alert and oriented  Assessment & Plan:  Keith Doyle was seen today for referral.  Diagnoses and all orders for this visit:  Essential hypertension Well controlled today Went over medication list in clinic [employer] nurse called concerned about pt taking meds appropraitely at home Will have home health nurse come to review medications -     Home Health -     Face-to-face encounter (required for Medicare/Medicaid patients)  Chronic diastolic congestive heart failure (HCC) Taking lasix BID Minimal swelling No SOB -     Home Health -      Face-to-face encounter (required for Medicare/Medicaid patients)  Paroxysmal a-fib ([place]) Cont warfarin INR check due next week, has appt sheduled -     Home Health -     Face-to-face encounter (required for Medicare/Medicaid patients)  DM2 Much improved BGL On metformin alone now May need to avoid steroid injections  Follow up plan: As scheduled next week for INR check, two months with PCP Keith Found, MD Keith Doyle

## 2016-08-19 ENCOUNTER — Telehealth: Payer: Self-pay | Admitting: *Deleted

## 2016-08-19 NOTE — Telephone Encounter (Signed)
Denyse Amass @ [facility] called and said that he visited patient today for medication management and that [facility]on was able to tell him all the medication he was taking and what it was for. There were 2 discrepancy with our medication list and what he has. Patient has ferrous sulfate 325mg  and Vitamin D3 5000. I have corrected these in his chart. They also recommended physical therapy due to patient being in a lot of pain while they were there and teaching. They are recommending the teaching because patient does not understand what CHF and they would like to follow up with patient.

## 2016-08-20 ENCOUNTER — Ambulatory Visit: Payer: Self-pay | Admitting: Pharmacist Clinician (PhC)/ Clinical Pharmacy Specialist

## 2016-08-23 ENCOUNTER — Ambulatory Visit (INDEPENDENT_AMBULATORY_CARE_PROVIDER_SITE_OTHER): Payer: Commercial Managed Care - HMO | Admitting: Pharmacist

## 2016-08-23 ENCOUNTER — Encounter: Payer: Self-pay | Admitting: Family Medicine

## 2016-08-23 DIAGNOSIS — I480 Paroxysmal atrial fibrillation: Secondary | ICD-10-CM

## 2016-08-23 LAB — COAGUCHEK XS/INR WAIVED
INR: 1.5 — ABNORMAL HIGH (ref 0.9–1.1)
PROTHROMBIN TIME: 17.8 s

## 2016-09-08 ENCOUNTER — Other Ambulatory Visit: Payer: Self-pay | Admitting: Family Medicine

## 2016-09-09 ENCOUNTER — Encounter: Payer: Self-pay | Admitting: Pharmacist

## 2016-09-09 ENCOUNTER — Ambulatory Visit (INDEPENDENT_AMBULATORY_CARE_PROVIDER_SITE_OTHER): Payer: Medicare HMO | Admitting: Pharmacist

## 2016-09-09 DIAGNOSIS — I480 Paroxysmal atrial fibrillation: Secondary | ICD-10-CM | POA: Diagnosis not present

## 2016-09-09 LAB — COAGUCHEK XS/INR WAIVED
INR: 1.4 — ABNORMAL HIGH (ref 0.9–1.1)
PROTHROMBIN TIME: 17 s

## 2016-09-09 NOTE — Patient Instructions (Signed)
Anticoagulation Dose Instructions as of 09/09/2016      Dorene Grebe Tue Wed Thu Fri Sat   New Dose 5 mg 2.5 mg 5 mg 5 mg 5 mg 5 mg 5 mg    Description   Take an extra 1/2 tablet when you get home today - Thursdays, January 4th.   Then increase warfarin dose to 5mg  - take 1 tablet every day except on Mondays only take 1/2 tablet.       INR was 1.4 today (too thick - goal is 2.0 to 3.0)

## 2016-09-14 ENCOUNTER — Other Ambulatory Visit: Payer: Self-pay

## 2016-09-17 ENCOUNTER — Ambulatory Visit (HOSPITAL_COMMUNITY): Payer: Commercial Managed Care - HMO | Admitting: Hematology & Oncology

## 2016-09-17 ENCOUNTER — Other Ambulatory Visit (HOSPITAL_COMMUNITY): Payer: Commercial Managed Care - HMO

## 2016-09-20 ENCOUNTER — Encounter: Payer: Self-pay | Admitting: Pharmacist

## 2016-09-24 DIAGNOSIS — H40033 Anatomical narrow angle, bilateral: Secondary | ICD-10-CM | POA: Diagnosis not present

## 2016-09-24 DIAGNOSIS — E119 Type 2 diabetes mellitus without complications: Secondary | ICD-10-CM | POA: Diagnosis not present

## 2016-09-24 LAB — HM DIABETES EYE EXAM

## 2016-09-27 ENCOUNTER — Ambulatory Visit (INDEPENDENT_AMBULATORY_CARE_PROVIDER_SITE_OTHER): Payer: Medicare HMO | Admitting: Pharmacist

## 2016-09-27 DIAGNOSIS — I480 Paroxysmal atrial fibrillation: Secondary | ICD-10-CM

## 2016-09-27 LAB — COAGUCHEK XS/INR WAIVED
INR: 1.9 — ABNORMAL HIGH (ref 0.9–1.1)
Prothrombin Time: 23.2 s

## 2016-09-29 ENCOUNTER — Other Ambulatory Visit: Payer: Self-pay

## 2016-09-29 DIAGNOSIS — E1141 Type 2 diabetes mellitus with diabetic mononeuropathy: Secondary | ICD-10-CM

## 2016-09-29 MED ORDER — BD SWAB SINGLE USE REGULAR PADS: MEDICATED_PAD | 2 refills | Status: DC

## 2016-09-29 MED ORDER — GLUCOSE BLOOD VI STRP: ORAL_STRIP | 12 refills | Status: DC

## 2016-09-29 MED ORDER — ACCU-CHEK SOFTCLIX LANCETS MISC: 12 refills | Status: DC

## 2016-10-03 ENCOUNTER — Other Ambulatory Visit (HOSPITAL_COMMUNITY): Payer: Self-pay | Admitting: Oncology

## 2016-10-03 DIAGNOSIS — D500 Iron deficiency anemia secondary to blood loss (chronic): Secondary | ICD-10-CM

## 2016-10-08 ENCOUNTER — Ambulatory Visit (INDEPENDENT_AMBULATORY_CARE_PROVIDER_SITE_OTHER): Payer: Medicare HMO | Admitting: Family Medicine

## 2016-10-08 ENCOUNTER — Encounter: Payer: Self-pay | Admitting: Family Medicine

## 2016-10-08 VITALS — BP 113/67 | HR 91 | Temp 97.4°F | Ht 72.0 in | Wt 217.0 lb

## 2016-10-08 DIAGNOSIS — E1140 Type 2 diabetes mellitus with diabetic neuropathy, unspecified: Secondary | ICD-10-CM | POA: Diagnosis not present

## 2016-10-08 DIAGNOSIS — I480 Paroxysmal atrial fibrillation: Secondary | ICD-10-CM | POA: Diagnosis not present

## 2016-10-08 DIAGNOSIS — E785 Hyperlipidemia, unspecified: Secondary | ICD-10-CM | POA: Diagnosis not present

## 2016-10-08 LAB — BAYER DCA HB A1C WAIVED: HB A1C (BAYER DCA - WAIVED): 7.3 % — ABNORMAL HIGH (ref <7.0)

## 2016-10-08 LAB — COAGUCHEK XS/INR WAIVED
INR: 1.9 — AB (ref 0.9–1.1)
PROTHROMBIN TIME: 23.1 s

## 2016-10-08 MED ORDER — GLUCOSE BLOOD VI STRP: ORAL_STRIP | 11 refills | Status: DC

## 2016-10-08 NOTE — Progress Notes (Signed)
Subjective:    Patient ID: Keith Doyle, male    DOB: 11/05/1935, 81 y.o.   MRN: [id number]  HPI 81 year old gentleman here to follow-up atrial fibrillation, hypertension, and diabetes with neuropathy. He is also followed for pro times routinely and this was checked today and is therapeutic at 1.9. In addition he is having problems with cataracts and will see the ophthalmologist next week. He denies any chest pain or increase in palpitations dizziness or near syncope. There is no shortness of breath or dependent edema.  Patient Active Problem List   Diagnosis Date Noted  . Iron deficiency anemia due to chronic blood loss 07/30/2015  . Chronic anticoagulation   . Renal insufficiency 12/30/2014  . Multiple gastric ulcers 12/07/2014  . Colon polyps 12/07/2014  . Hemorrhoids 12/07/2014  . Warfarin-induced coagulopathy ([place]) 12/06/2014  . Bradycardia 12/06/2014  . Anemia due to chronic blood loss 12/05/2014  . AKI (acute kidney injury) ([place]) 12/05/2014  . Lumbar back pain 12/05/2014  . Paroxysmal a-fib ([place]) 12/05/2014  . Chronic diastolic congestive heart failure ([place]) 12/05/2014  . HLD (hyperlipidemia) 12/05/2014  . Back pain   . Paroxysmal atrial fibrillation ([place]) 10/03/2014  . Osteopenia of the elderly 10/03/2014  . Dizziness 06/20/2012  . Exertional dyspnea 06/20/2012  . Diabetes mellitus with neuropathy ([place]) 06/20/2012  . Influenza A 08/28/2011  . Hypokalemia 08/28/2011  . ARF (acute renal failure) ([place]) 08/28/2011  . BPH (benign prostatic hyperplasia) 11/28/2010  . DVT of lower extremity (deep venous thrombosis) ([place]) 11/28/2010  . Osteopenia 11/28/2010  . ED (erectile dysfunction) 11/28/2010  . Peripheral neuropathy ([place]) 11/28/2010  . Hyperlipidemia 11/28/2010  . OVERWEIGHT 01/02/2009  . CARDIOVASCULAR FUNCTION STUDY, ABNORMAL 01/02/2009  . OTH NONSPECIFIC ABNORM CV SYSTEM FUNCTION STUDY 01/02/2009  . Essential hypertension 01/01/2009   Outpatient Encounter  Prescriptions as of 10/08/2016  Medication Sig  . ACCU-CHEK SOFTCLIX LANCETS lancets Use as instructed  . Acetaminophen 500 MG coapsule Take 500 mg by mouth as needed for pain.   . Alcohol Swabs (B-D SINGLE USE SWABS REGULAR) PADS Use up to qid  . amLODipine (NORVASC) 10 MG tablet TAKE 1 TABLET EVERY DAY  . atorvastatin (LIPITOR) 40 MG tablet TAKE 1 TABLET EVERY DAY  . benazepril (LOTENSIN) 40 MG tablet TAKE 1 TABLET TWICE DAILY  . Cholecalciferol (VITAMIN D-3) 5000 units TABS Take by mouth.  . fenofibrate 160 MG tablet TAKE 1 TABLET EVERY DAY  . ferrous sulfate 325 (65 FE) MG tablet Take 325 mg by mouth daily with breakfast.  . furosemide (LASIX) 20 MG tablet TAKE 1 TABLET TWICE DAILY  - APPOINTMENT IS NEEDED  . gabapentin (NEURONTIN) 400 MG capsule TAKE 2 CAPSULES TWICE DAILY  . glucose blood (FREESTYLE TEST STRIPS) test strip Use as instructed  . glucose blood test strip Use to check blood glucose once daily.  Dx:  Type 2 diabetes E11.9  . Incontinence Supply Disposable (BLADDER CONTROL PADS EX ABSORB) MISC   . Lancets (ONETOUCH ULTRASOFT) lancets Use as instructed  . meclizine (ANTIVERT) 25 MG tablet TAKE 1 TABLET THREE TIMES DAILY AS NEEDED  FOR  DIZZINESS  . metFORMIN (GLUCOPHAGE-XR) 500 MG 24 hr tablet Take 1 tablet (500 mg total) by mouth 2 (two) times daily.  . Multiple Vitamins-Minerals (CENTRUM SILVER 50+MEN) TABS Take by mouth.  . pantoprazole (PROTONIX) 40 MG tablet TAKE 1 TABLET TWICE DAILY  BEFORE  MEAL  . potassium chloride SA (K-DUR,KLOR-CON) 20 MEQ tablet TAKE 1/2 TABLET TWICE DAILY AS NEEDED  WHEN  YOU  TAKE  LASIX ( FUROSEMIDE)  . Vitamins A & D 5000-400 units CAPS   . warfarin (COUMADIN) 5 MG tablet TAKE 1 TABLET EVERY DAY EXCEPT 1/2 TABLET ON MONDAY AND FRIDAY AS DIRECTED   No facility-administered encounter medications on file as of 10/08/2016.       Review of Systems  Constitutional: Activity change: due to decreased night vision.  Respiratory: Negative.     Cardiovascular: Negative.   Genitourinary: Positive for urgency.  Psychiatric/Behavioral: Negative.        Objective:   Physical Exam  Constitutional: He is oriented to person, place, and time. He appears well-developed and well-nourished.  Cardiovascular: Normal rate.   Rhythm is irregular consistent with AF  Pulmonary/Chest: Effort normal and breath sounds normal.  Neurological: He is alert and oriented to person, place, and time.   BP 113/67   Pulse 91   Temp 97.4 F (36.3 C) (Oral)   Ht 6' (1.829 m)   Wt 217 lb (98.4 kg)   BMI 29.43 kg/m         Assessment & Plan:  1. Paroxysmal atrial fibrillation (HCC) INR is 1.9. Will continue same dose of Coumadin - CoaguChek XS/INR Waived  2. Type 2 diabetes mellitus with diabetic neuropathy, unspecified long term insulin use status (HCC) A1c today is 7.3. This is unchanged from last test and is acceptable - Bayer DCA Hb A1c Waived - CMP14+EGFR - glucose blood test strip; Use to check blood glucose BID and PRN.  Dx:  Type 2 diabetes E11.9  Dispense: 100 each; Refill: 11  3. Hyperlipidemia, unspecified hyperlipidemia type Lipids were last assessed one year ago and LDL was at goal 41 but triglycerides continue to be elevated despite therapy - Lipid panel   [name] MD

## 2016-10-09 LAB — LIPID PANEL
CHOL/HDL RATIO: 5.1 ratio — AB (ref 0.0–5.0)
Cholesterol, Total: 142 mg/dL (ref 100–199)
HDL: 28 mg/dL — ABNORMAL LOW (ref >39)
LDL CALC: 48 mg/dL (ref 0–99)
TRIGLYCERIDES: 329 mg/dL — AB (ref 0–149)
VLDL Cholesterol Cal: 66 mg/dL — ABNORMAL HIGH (ref 5–40)

## 2016-10-09 LAB — CMP14+EGFR
A/G RATIO: 2.2 (ref 1.2–2.2)
ALBUMIN: 4.1 g/dL (ref 3.5–4.8)
ALT: 15 IU/L (ref 0–44)
AST: 14 IU/L (ref 0–40)
Alkaline Phosphatase: 45 IU/L (ref 39–117)
BILIRUBIN TOTAL: 0.2 mg/dL (ref 0.0–1.2)
BUN/Creatinine Ratio: 13 (ref 10–24)
BUN: 11 mg/dL (ref 8–27)
CALCIUM: 9.6 mg/dL (ref 8.6–10.2)
CHLORIDE: 102 mmol/L (ref 96–106)
CO2: 23 mmol/L (ref 18–29)
Creatinine, Ser: 0.88 mg/dL (ref 0.76–1.27)
GFR, EST AFRICAN AMERICAN: 95 mL/min/{1.73_m2} (ref >59)
GFR, EST NON AFRICAN AMERICAN: 82 mL/min/{1.73_m2} (ref >59)
Globulin, Total: 1.9 g/dL (ref 1.5–4.5)
Glucose: 153 mg/dL — ABNORMAL HIGH (ref 65–99)
POTASSIUM: 3.9 mmol/L (ref 3.5–5.2)
Sodium: 141 mmol/L (ref 134–144)
TOTAL PROTEIN: 6 g/dL (ref 6.0–8.5)

## 2016-10-11 ENCOUNTER — Other Ambulatory Visit: Payer: Self-pay | Admitting: Family Medicine

## 2016-10-12 ENCOUNTER — Telehealth: Payer: Self-pay | Admitting: Family Medicine

## 2016-10-12 DIAGNOSIS — H25812 Combined forms of age-related cataract, left eye: Secondary | ICD-10-CM | POA: Diagnosis not present

## 2016-10-12 DIAGNOSIS — H25811 Combined forms of age-related cataract, right eye: Secondary | ICD-10-CM | POA: Diagnosis not present

## 2016-10-12 DIAGNOSIS — E119 Type 2 diabetes mellitus without complications: Secondary | ICD-10-CM | POA: Diagnosis not present

## 2016-10-12 DIAGNOSIS — H04123 Dry eye syndrome of bilateral lacrimal glands: Secondary | ICD-10-CM | POA: Diagnosis not present

## 2016-10-12 DIAGNOSIS — H25813 Combined forms of age-related cataract, bilateral: Secondary | ICD-10-CM | POA: Diagnosis not present

## 2016-10-12 NOTE — Telephone Encounter (Signed)
Last lab and office note faxed to [facility]

## 2016-10-15 ENCOUNTER — Encounter (HOSPITAL_COMMUNITY): Payer: Medicare HMO | Attending: Hematology & Oncology

## 2016-10-15 ENCOUNTER — Ambulatory Visit (HOSPITAL_COMMUNITY): Payer: Commercial Managed Care - HMO | Admitting: Hematology & Oncology

## 2016-10-15 ENCOUNTER — Other Ambulatory Visit (HOSPITAL_COMMUNITY): Payer: Commercial Managed Care - HMO

## 2016-10-15 DIAGNOSIS — D500 Iron deficiency anemia secondary to blood loss (chronic): Secondary | ICD-10-CM | POA: Insufficient documentation

## 2016-10-15 LAB — IRON AND TIBC
IRON: 36 ug/dL — AB (ref 45–182)
Saturation Ratios: 9 % — ABNORMAL LOW (ref 17.9–39.5)
TIBC: 393 ug/dL (ref 250–450)
UIBC: 357 ug/dL

## 2016-10-15 LAB — CBC WITH DIFFERENTIAL/PLATELET
Basophils Absolute: 0 10*3/uL (ref 0.0–0.1)
Basophils Relative: 1 %
EOS PCT: 3 %
Eosinophils Absolute: 0.2 10*3/uL (ref 0.0–0.7)
HEMATOCRIT: 36.4 % — AB (ref 39.0–52.0)
Hemoglobin: 12.1 g/dL — ABNORMAL LOW (ref 13.0–17.0)
LYMPHS PCT: 29 %
Lymphs Abs: 2.1 10*3/uL (ref 0.7–4.0)
MCH: 30.7 pg (ref 26.0–34.0)
MCHC: 33.2 g/dL (ref 30.0–36.0)
MCV: 92.4 fL (ref 78.0–100.0)
MONO ABS: 0.5 10*3/uL (ref 0.1–1.0)
MONOS PCT: 6 %
NEUTROS ABS: 4.6 10*3/uL (ref 1.7–7.7)
Neutrophils Relative %: 61 %
PLATELETS: 251 10*3/uL (ref 150–400)
RBC: 3.94 MIL/uL — ABNORMAL LOW (ref 4.22–5.81)
RDW: 14.5 % (ref 11.5–15.5)
WBC: 7.5 10*3/uL (ref 4.0–10.5)

## 2016-10-15 LAB — BASIC METABOLIC PANEL
Anion gap: 9 (ref 5–15)
BUN: 15 mg/dL (ref 6–20)
CHLORIDE: 103 mmol/L (ref 101–111)
CO2: 28 mmol/L (ref 22–32)
Calcium: 9.2 mg/dL (ref 8.9–10.3)
Creatinine, Ser: 1.07 mg/dL (ref 0.61–1.24)
Glucose, Bld: 152 mg/dL — ABNORMAL HIGH (ref 65–99)
POTASSIUM: 3.7 mmol/L (ref 3.5–5.1)
SODIUM: 140 mmol/L (ref 135–145)

## 2016-10-15 LAB — FERRITIN: FERRITIN: 66 ng/mL (ref 24–336)

## 2016-10-18 ENCOUNTER — Telehealth: Payer: Self-pay | Admitting: Family Medicine

## 2016-10-18 ENCOUNTER — Other Ambulatory Visit (HOSPITAL_COMMUNITY): Payer: Self-pay | Admitting: Oncology

## 2016-10-18 NOTE — Telephone Encounter (Signed)
Returned patient's phone call.  Patient states that he had blood work done in the hospital which showed that his iron and hgb was low.   Patient wanted to know if hgb was checked at office visit with miller.  Informed patient that he did not check his hgb.  Patient verbalized understanding

## 2016-10-21 DIAGNOSIS — H2512 Age-related nuclear cataract, left eye: Secondary | ICD-10-CM | POA: Diagnosis not present

## 2016-10-21 DIAGNOSIS — H25812 Combined forms of age-related cataract, left eye: Secondary | ICD-10-CM | POA: Diagnosis not present

## 2016-10-26 ENCOUNTER — Encounter: Payer: Self-pay | Admitting: Family Medicine

## 2016-10-26 ENCOUNTER — Ambulatory Visit (INDEPENDENT_AMBULATORY_CARE_PROVIDER_SITE_OTHER): Payer: Medicare HMO | Admitting: Family Medicine

## 2016-10-26 VITALS — BP 103/62 | HR 93 | Temp 97.2°F | Ht 72.0 in | Wt 218.0 lb

## 2016-10-26 DIAGNOSIS — M25561 Pain in right knee: Secondary | ICD-10-CM | POA: Diagnosis not present

## 2016-10-26 DIAGNOSIS — M25562 Pain in left knee: Secondary | ICD-10-CM | POA: Diagnosis not present

## 2016-10-26 DIAGNOSIS — G8929 Other chronic pain: Secondary | ICD-10-CM

## 2016-10-26 NOTE — Progress Notes (Signed)
Subjective:    Patient ID: Keith Doyle, male    DOB: [birth year], 81 y.o.   MRN: [id number]  HPI Patient here today for bilateral knee pain. Patient has received previous injections with steroid and Marcaine and this so far is effective. He is requesting injections again today. He tells me that it has been about 5 months since he last received injections.     Patient Active Problem List   Diagnosis Date Noted  . Iron deficiency anemia due to chronic blood loss 07/30/2015  . Chronic anticoagulation   . Renal insufficiency 12/30/2014  . Multiple gastric ulcers 12/07/2014  . Colon polyps 12/07/2014  . Hemorrhoids 12/07/2014  . Warfarin-induced coagulopathy ([place]) 12/06/2014  . Bradycardia 12/06/2014  . Anemia due to chronic blood loss 12/05/2014  . AKI (acute kidney injury) ([place]) 12/05/2014  . Lumbar back pain 12/05/2014  . Paroxysmal a-fib ([place]) 12/05/2014  . Chronic diastolic congestive heart failure ([place]) 12/05/2014  . HLD (hyperlipidemia) 12/05/2014  . Back pain   . Paroxysmal atrial fibrillation ([place]) 10/03/2014  . Osteopenia of the elderly 10/03/2014  . Dizziness 06/20/2012  . Exertional dyspnea 06/20/2012  . Diabetes mellitus with neuropathy ([place]) 06/20/2012  . Influenza A 08/28/2011  . Hypokalemia 08/28/2011  . ARF (acute renal failure) ([place]) 08/28/2011  . BPH (benign prostatic hyperplasia) 11/28/2010  . DVT of lower extremity (deep venous thrombosis) ([place]) 11/28/2010  . Osteopenia 11/28/2010  . ED (erectile dysfunction) 11/28/2010  . Peripheral neuropathy ([place]) 11/28/2010  . Hyperlipidemia 11/28/2010  . OVERWEIGHT 01/02/2009  . CARDIOVASCULAR FUNCTION STUDY, ABNORMAL 01/02/2009  . OTH NONSPECIFIC ABNORM CV SYSTEM FUNCTION STUDY 01/02/2009  . Essential hypertension 01/01/2009   Outpatient Encounter Prescriptions as of 10/26/2016  Medication Sig  . ACCU-CHEK SOFTCLIX LANCETS lancets Use as instructed  . Acetaminophen 500 MG coapsule Take 500 mg by mouth as needed  for pain.   . Alcohol Swabs (B-D SINGLE USE SWABS REGULAR) PADS Use up to qid  . amLODipine (NORVASC) 10 MG tablet TAKE 1 TABLET EVERY DAY  . atorvastatin (LIPITOR) 40 MG tablet TAKE 1 TABLET EVERY DAY  . benazepril (LOTENSIN) 40 MG tablet TAKE 1 TABLET TWICE DAILY  . Cholecalciferol (VITAMIN D-3) 5000 units TABS Take by mouth.  . fenofibrate 160 MG tablet TAKE 1 TABLET EVERY DAY  . ferrous sulfate 325 (65 FE) MG tablet Take 325 mg by mouth daily with breakfast.  . furosemide (LASIX) 20 MG tablet TAKE 1 TABLET TWICE DAILY  - APPOINTMENT IS NEEDED  . gabapentin (NEURONTIN) 400 MG capsule TAKE 2 CAPSULES TWICE DAILY  . glucose blood (FREESTYLE TEST STRIPS) test strip Use as instructed  . glucose blood test strip Use to check blood glucose BID and PRN.  Dx:  Type 2 diabetes E11.9  . Incontinence Supply Disposable (BLADDER CONTROL PADS EX ABSORB) MISC   . Lancets (ONETOUCH ULTRASOFT) lancets Use as instructed  . meclizine (ANTIVERT) 25 MG tablet TAKE 1 TABLET THREE TIMES DAILY AS NEEDED  FOR  DIZZINESS  . metFORMIN (GLUCOPHAGE-XR) 500 MG 24 hr tablet Take 1 tablet (500 mg total) by mouth 2 (two) times daily.  . Multiple Vitamins-Minerals (CENTRUM SILVER 50+MEN) TABS Take by mouth.  . pantoprazole (PROTONIX) 40 MG tablet TAKE 1 TABLET TWICE DAILY  BEFORE  MEAL  . potassium chloride SA (K-DUR,KLOR-CON) 20 MEQ tablet TAKE 1/2 TABLET TWICE DAILY AS NEEDED    WHEN  YOU  TAKE  LASIX ( FUROSEMIDE)  . Vitamins A & D 5000-400 units CAPS   .  warfarin (COUMADIN) 5 MG tablet TAKE 1 TABLET EVERY DAY EXCEPT 1/2 TABLET ON MONDAY AND FRIDAY AS DIRECTED   No facility-administered encounter medications on file as of 10/26/2016.       Review of Systems  Constitutional: Negative.   HENT: Negative.   Eyes: Negative.   Respiratory: Negative.   Cardiovascular: Negative.   Gastrointestinal: Negative.   Endocrine: Negative.   Genitourinary: Negative.   Musculoskeletal: Positive for arthralgias (bilateral knee  pain).  Skin: Negative.   Allergic/Immunologic: Negative.   Neurological: Negative.   Hematological: Negative.   Psychiatric/Behavioral: Negative.        Objective:   Physical Exam  Musculoskeletal:  Both knees were prepped with alcohol and injected with 1 mL of Marcaine 1 mL of Depo-Medrol. Injections were easily administered and patient tolerated well.   BP 103/62 (BP Location: Left Arm)   Pulse 93   Temp 97.2 F (36.2 C) (Oral)   Ht 6' (1.829 m)   Wt 218 lb (98.9 kg)   BMI 29.57 kg/m         Assessment & Plan:  1. Chronic pain of both knees Presumed degenerative changes and arthritis. Injections have been effective and have been given every 4-5 months.  [name] MD

## 2016-10-28 ENCOUNTER — Encounter (HOSPITAL_BASED_OUTPATIENT_CLINIC_OR_DEPARTMENT_OTHER): Payer: Medicare HMO

## 2016-10-28 ENCOUNTER — Encounter: Payer: Self-pay | Admitting: Pharmacist

## 2016-10-28 ENCOUNTER — Ambulatory Visit (INDEPENDENT_AMBULATORY_CARE_PROVIDER_SITE_OTHER): Payer: Medicare HMO | Admitting: Pharmacist

## 2016-10-28 VITALS — BP 120/68 | HR 60 | Ht 72.0 in | Wt 220.0 lb

## 2016-10-28 VITALS — BP 111/63 | HR 55 | Temp 98.0°F | Resp 20

## 2016-10-28 DIAGNOSIS — I480 Paroxysmal atrial fibrillation: Secondary | ICD-10-CM | POA: Diagnosis not present

## 2016-10-28 DIAGNOSIS — K922 Gastrointestinal hemorrhage, unspecified: Secondary | ICD-10-CM

## 2016-10-28 DIAGNOSIS — Z0000 Encounter for general adult medical examination without abnormal findings: Secondary | ICD-10-CM

## 2016-10-28 DIAGNOSIS — D500 Iron deficiency anemia secondary to blood loss (chronic): Secondary | ICD-10-CM

## 2016-10-28 LAB — COAGUCHEK XS/INR WAIVED
INR: 2 — AB (ref 0.9–1.1)
PROTHROMBIN TIME: 24.5 s

## 2016-10-28 MED ORDER — SODIUM CHLORIDE 0.9 % IV SOLN
Freq: Once | INTRAVENOUS | Status: AC
  Administered 2016-10-28: 14:00:00 via INTRAVENOUS

## 2016-10-28 MED ORDER — LIRAGLUTIDE 18 MG/3ML ~~LOC~~ SOPN: PEN_INJECTOR | SUBCUTANEOUS | 0 refills | Status: DC

## 2016-10-28 MED ORDER — FERUMOXYTOL INJECTION 510 MG/17 ML
510.0000 mg | Freq: Once | INTRAVENOUS | Status: AC
  Administered 2016-10-28: 510 mg via INTRAVENOUS
  Filled 2016-10-28: qty 17

## 2016-10-28 NOTE — Patient Instructions (Addendum)
  Mr. Agosto , Thank you for taking time to come for your Medicare Wellness Visit. I appreciate your ongoing commitment to your health goals. Please review the following plan we discussed and let me know if I can assist you in the future.   These are the goals we discussed:  Continue to exercise daily   Increase non-starchy vegetables - carrots, green bean, squash, zucchini, tomatoes, onions, peppers, spinach and other green leafy vegetables, cabbage, lettuce, cucumbers, asparagus, okra (not fried), eggplant Limit sugar and processed foods (cakes, cookies, ice cream, crackers and chips) Increase fresh fruit but limit serving sizes 1/2 cup or about the size of tennis or baseball Limit red meat to no more than 1-2 times per week (serving size about the size of your palm) Choose whole grains / lean proteins - whole wheat bread, quinoa, whole grain rice (1/2 cup), fish, chicken, [place] Avoid sugar and calorie containing beverages - soda, sweet tea and juice.  Choose water or unsweetened tea instead.   Bring in glucometer or call with name of glucometer so I can make sure we order the correct strips and lancets   This is a list of the screening recommended for you and due dates:  Health Maintenance  Topic Date Due  . DEXA scan (bone density measurement)  10/25/2008  . [facility] Had at 9:15 today with Dr Marin Comment  . Complete foot exam   03/01/2017  . Hemoglobin A1C  04/07/2017  . Tetanus Vaccine  03/01/2021  . Flu Shot  Completed  . Pneumonia vaccines  Completed

## 2016-10-28 NOTE — Progress Notes (Signed)
Tolerated iron infusion well. Stable and ambulatory on discharge home with daughter. 

## 2016-10-28 NOTE — Patient Instructions (Signed)
[facility] at [facility] Discharge Instructions  RECOMMENDATIONS MADE BY THE CONSULTANT AND ANY TEST RESULTS WILL BE SENT TO YOUR REFERRING PHYSICIAN.  Feraheme 510 mg iron infusion given as ordered. Return as scheduled.  Thank you for choosing [facility] at [facility] to provide your oncology and hematology care.  To afford each patient quality time with our provider, please arrive at least 15 minutes before your scheduled appointment time.    If you have a lab appointment with the Cancer Center please come in thru the  Main Entrance and check in at the main information desk  You need to re-schedule your appointment should you arrive 10 or more minutes late.  We strive to give you quality time with our providers, and arriving late affects you and other patients whose appointments are after yours.  Also, if you no show three or more times for appointments you may be dismissed from the clinic at the providers discretion.     Again, thank you for choosing [facility].  Our hope is that these requests will decrease the amount of time that you wait before being seen by our physicians.       _____________________________________________________________  Should you have questions after your visit to [facility], please contact our office at [phone] between the hours of 8:30 a.m. and 4:30 p.m.  Voicemails left after 4:30 p.m. will not be returned until the following business day.  For prescription refill requests, have your pharmacy contact our office.       Resources For Cancer Patients and their Caregivers ? American Cancer Society: Can assist with transportation, wigs, general needs, runs Look Good Feel Better.        1-888-227-6333 ? Cancer Care: Provides financial assistance, online support groups, medication/co-pay assistance.  1-800-813-HOPE (4673) ? [facility] Assists [place]  cancer patients and their families through emotional , educational and financial support.  336-427-4357 ? [place] DSS Where to apply for food stamps, Medicaid and utility assistance. 336-342-1394 ? [facility]: Transportation to medical appointments. 336-347-2287 ? Social Security Administration: May apply for disability if have a Stage IV cancer. 336-342-7796 1-800-772-1213 ? [place] Aging, Disability and Transit Services: Assists with nutrition, care and transit needs. 336-349-2343  Cancer Center Support Programs: @[email or web address]@ > Cancer Support Group  2nd Tuesday of the month 1pm-2pm, Journey Room  > Creative Journey  3rd Tuesday of the month 1130am-1pm, Journey Room  > Look Good Feel Better  1st Wednesday of the month 10am-12 noon, Journey Room (Call American Cancer Society to register 1-800-395-5775)   
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
## 2016-11-05 ENCOUNTER — Other Ambulatory Visit: Payer: Self-pay | Admitting: Pharmacist

## 2016-11-05 MED ORDER — GLUCOSE BLOOD VI STRP
ORAL_STRIP | 2 refills | Status: DC
Start: 2016-11-05 — End: 2017-01-13

## 2016-11-05 MED ORDER — CVS LANCETS ULTRA-THIN 30G MISC: 1.0000 | Freq: Two times a day (BID) | 2 refills | Status: DC

## 2016-11-11 DIAGNOSIS — H2511 Age-related nuclear cataract, right eye: Secondary | ICD-10-CM | POA: Diagnosis not present

## 2016-11-11 DIAGNOSIS — H25811 Combined forms of age-related cataract, right eye: Secondary | ICD-10-CM | POA: Diagnosis not present

## 2016-11-16 ENCOUNTER — Ambulatory Visit (HOSPITAL_COMMUNITY): Payer: Commercial Managed Care - HMO

## 2016-11-16 ENCOUNTER — Other Ambulatory Visit (HOSPITAL_COMMUNITY): Payer: Commercial Managed Care - HMO

## 2016-11-25 ENCOUNTER — Ambulatory Visit (INDEPENDENT_AMBULATORY_CARE_PROVIDER_SITE_OTHER): Payer: Medicare HMO | Admitting: Pharmacist

## 2016-11-25 DIAGNOSIS — I480 Paroxysmal atrial fibrillation: Secondary | ICD-10-CM

## 2016-11-25 LAB — COAGUCHEK XS/INR WAIVED
INR: 1.9 — ABNORMAL HIGH (ref 0.9–1.1)
Prothrombin Time: 23.2 s

## 2016-11-25 NOTE — Progress Notes (Signed)
protime only - see notes 

## 2016-12-02 ENCOUNTER — Ambulatory Visit (HOSPITAL_COMMUNITY): Payer: Self-pay

## 2016-12-02 ENCOUNTER — Encounter (HOSPITAL_BASED_OUTPATIENT_CLINIC_OR_DEPARTMENT_OTHER): Payer: Medicare HMO | Admitting: Oncology

## 2016-12-02 ENCOUNTER — Encounter (HOSPITAL_COMMUNITY): Payer: Medicare HMO | Attending: Oncology

## 2016-12-02 ENCOUNTER — Other Ambulatory Visit (HOSPITAL_COMMUNITY): Payer: Medicare HMO

## 2016-12-02 ENCOUNTER — Encounter (HOSPITAL_COMMUNITY): Payer: Self-pay

## 2016-12-02 VITALS — BP 141/78 | HR 83 | Temp 98.4°F | Resp 18 | Wt 215.8 lb

## 2016-12-02 DIAGNOSIS — K922 Gastrointestinal hemorrhage, unspecified: Secondary | ICD-10-CM | POA: Diagnosis not present

## 2016-12-02 DIAGNOSIS — D500 Iron deficiency anemia secondary to blood loss (chronic): Secondary | ICD-10-CM

## 2016-12-02 LAB — CBC WITH DIFFERENTIAL/PLATELET
BASOS PCT: 1 %
Basophils Absolute: 0.1 10*3/uL (ref 0.0–0.1)
Eosinophils Absolute: 0.2 10*3/uL (ref 0.0–0.7)
Eosinophils Relative: 3 %
HCT: 40.4 % (ref 39.0–52.0)
Hemoglobin: 13.6 g/dL (ref 13.0–17.0)
LYMPHS PCT: 38 %
Lymphs Abs: 2.2 10*3/uL (ref 0.7–4.0)
MCH: 30.8 pg (ref 26.0–34.0)
MCHC: 33.7 g/dL (ref 30.0–36.0)
MCV: 91.6 fL (ref 78.0–100.0)
MONO ABS: 0.3 10*3/uL (ref 0.1–1.0)
MONOS PCT: 5 %
NEUTROS ABS: 3 10*3/uL (ref 1.7–7.7)
NEUTROS PCT: 53 %
Platelets: 206 10*3/uL (ref 150–400)
RBC: 4.41 MIL/uL (ref 4.22–5.81)
RDW: 14.8 % (ref 11.5–15.5)
WBC: 5.7 10*3/uL (ref 4.0–10.5)

## 2016-12-02 LAB — RENAL FUNCTION PANEL
ALBUMIN: 3.9 g/dL (ref 3.5–5.0)
Anion gap: 7 (ref 5–15)
BUN: 10 mg/dL (ref 6–20)
CALCIUM: 9.5 mg/dL (ref 8.9–10.3)
CO2: 31 mmol/L (ref 22–32)
Chloride: 99 mmol/L — ABNORMAL LOW (ref 101–111)
Creatinine, Ser: 0.97 mg/dL (ref 0.61–1.24)
GFR calc Af Amer: >60 mL/min (ref >60)
GFR calc non Af Amer: >60 mL/min (ref >60)
GLUCOSE: 254 mg/dL — AB (ref 65–99)
PHOSPHORUS: 3.2 mg/dL (ref 2.5–4.6)
POTASSIUM: 4 mmol/L (ref 3.5–5.1)
SODIUM: 137 mmol/L (ref 135–145)

## 2016-12-02 LAB — IRON AND TIBC
Iron: 54 ug/dL (ref 45–182)
Saturation Ratios: 14 % — ABNORMAL LOW (ref 17.9–39.5)
TIBC: 374 ug/dL (ref 250–450)
UIBC: 320 ug/dL

## 2016-12-02 LAB — FERRITIN: Ferritin: 113 ng/mL (ref 24–336)
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
## 2016-12-02 NOTE — Patient Instructions (Signed)
[facility] at [facility] Discharge Instructions  RECOMMENDATIONS MADE BY THE CONSULTANT AND ANY TEST RESULTS WILL BE SENT TO YOUR REFERRING PHYSICIAN.  You were seen today by Dr. Louise Zhou Follow up in 4 months with lab work See Amy up front for appointments   Thank you for choosing [facility] at [facility] to provide your oncology and hematology care.  To afford each patient quality time with our provider, please arrive at least 15 minutes before your scheduled appointment time.    If you have a lab appointment with the Cancer Center please come in thru the  Main Entrance and check in at the main information desk  You need to re-schedule your appointment should you arrive 10 or more minutes late.  We strive to give you quality time with our providers, and arriving late affects you and other patients whose appointments are after yours.  Also, if you no show three or more times for appointments you may be dismissed from the clinic at the providers discretion.     Again, thank you for choosing [facility].  Our hope is that these requests will decrease the amount of time that you wait before being seen by our physicians.       _____________________________________________________________  Should you have questions after your visit to [facility], please contact our office at [phone] between the hours of 8:30 a.m. and 4:30 p.m.  Voicemails left after 4:30 p.m. will not be returned until the following business day.  For prescription refill requests, have your pharmacy contact our office.       Resources For Cancer Patients and their Caregivers ? American Cancer Society: Can assist with transportation, wigs, general needs, runs Look Good Feel Better.        1-888-227-6333 ? Cancer Care: Provides financial assistance, online support groups, medication/co-pay assistance.  1-800-813-HOPE (4673) ? [facility] Assists [place] cancer patients and their families through emotional , educational and financial support.  336-427-4357 ? [place] DSS Where to apply for food stamps, Medicaid and utility assistance. 336-342-1394 ? [facility]: Transportation to medical appointments. 336-347-2287 ? Social Security Administration: May apply for disability if have a Stage IV cancer. 336-342-7796 1-800-772-1213 ? [place] Aging, Disability and Transit Services: Assists with nutrition, care and transit needs. 336-349-2343  Cancer Center Support Programs: @10RELATIVEDAYS@ > Cancer Support Group  2nd Tuesday of the month 1pm-2pm, Journey Room  > Creative Journey  3rd Tuesday of the month 1130am-1pm, Journey Room  > Look Good Feel Better  1st Wednesday of the month 10am-12 noon, Journey Room (Call American Cancer Society to register 1-800-395-5775)    

## 2016-12-06 ENCOUNTER — Other Ambulatory Visit: Payer: Self-pay | Admitting: Pediatrics

## 2016-12-06 ENCOUNTER — Other Ambulatory Visit: Payer: Self-pay | Admitting: Family Medicine

## 2016-12-06 DIAGNOSIS — E1140 Type 2 diabetes mellitus with diabetic neuropathy, unspecified: Secondary | ICD-10-CM

## 2016-12-08 ENCOUNTER — Other Ambulatory Visit: Payer: Self-pay

## 2016-12-08 MED ORDER — TRUE METRIX AIR GLUCOSE METER DEVI: 1.0000 | Freq: Two times a day (BID) | 0 refills | Status: DC

## 2016-12-08 MED ORDER — TRUEPLUS LANCETS 33G MISC: 1 refills | Status: DC

## 2016-12-09 ENCOUNTER — Other Ambulatory Visit: Payer: Self-pay | Admitting: Cardiology

## 2016-12-10 ENCOUNTER — Ambulatory Visit (INDEPENDENT_AMBULATORY_CARE_PROVIDER_SITE_OTHER): Payer: Medicare HMO | Admitting: Family

## 2016-12-10 ENCOUNTER — Encounter: Payer: Self-pay | Admitting: Family

## 2016-12-10 VITALS — BP 136/84 | HR 87 | Temp 97.0°F | Ht 72.0 in | Wt 211.0 lb

## 2016-12-10 DIAGNOSIS — R609 Edema, unspecified: Secondary | ICD-10-CM

## 2016-12-10 MED ORDER — AMLODIPINE BESYLATE 5 MG PO TABS: 5.0000 mg | ORAL_TABLET | Freq: Every day | ORAL | 3 refills | Status: DC

## 2016-12-10 MED ORDER — FUROSEMIDE 20 MG PO TABS: ORAL_TABLET | ORAL | 0 refills | Status: DC

## 2016-12-10 NOTE — Patient Instructions (Signed)

## 2016-12-10 NOTE — Progress Notes (Signed)
   Subjective:    Patient ID: Keith Doyle, male    DOB: 04/26/1936, 81 y.o.   MRN: [id number]  HPI Pt presents to the office today with recurrent peripheral edema. Pt states this happens "all the time", but seems to get worse. Pt states at the end of the day he has edema in bilateral ankles and feet. Pt states he is taking his lasix 20 mg BID.   PT currently taking norvasc 10 mg daily and states he has taken it for years with no problems.    Review of Systems  Cardiovascular: Positive for leg swelling.  All other systems reviewed and are negative.      Objective:   Physical Exam  Constitutional: He is oriented to person, place, and time. He appears well-developed and well-nourished. No distress.  HENT:  Head: Normocephalic.  Cardiovascular: Normal rate, regular rhythm, normal heart sounds and intact distal pulses.   No murmur heard. Pulmonary/Chest: Effort normal and breath sounds normal. No respiratory distress. He has no wheezes.  Abdominal: Soft. Bowel sounds are normal. He exhibits no distension. There is no tenderness.  Musculoskeletal: Normal range of motion. He exhibits edema (3+ in bilateral ankles). He exhibits no tenderness.  Neurological: He is alert and oriented to person, place, and time.  Skin: Skin is warm and dry. No rash noted. No erythema.  Psychiatric: He has a normal mood and affect. His behavior is normal. Judgment and thought content normal.  Vitals reviewed.     BP 136/84   Pulse 87   Temp 97 F (36.1 C) (Oral)   Ht 6' (1.829 m)   Wt 211 lb (95.7 kg)   BMI 28.62 kg/m      Assessment & Plan:  1. Peripheral edema -Norvasc decreased to 5 mg from 10 mg PT to follow up in 1 week to recheck BP, last visit pt's BP was 124/73, if elevated will add Beta blocker Low salt diet Keep feet elevated Compression hose RTO in 1 week - amLODipine (NORVASC) 5 MG tablet; Take 1 tablet (5 mg total) by mouth daily.  Dispense: 90 tablet; Refill: 3 - Compression  stockings   Evelina Dun, FNP

## 2016-12-10 NOTE — Addendum Note (Signed)
Addended by: Vennie Homans on: 12/10/2016 10:10 AM   Modules accepted: Orders

## 2016-12-10 NOTE — Telephone Encounter (Signed)
Rx has been sent to the pharmacy electronically. Furosemide send into pt pharmacy, left messae on pt voicemail to call back and schedule appt.

## 2016-12-11 ENCOUNTER — Ambulatory Visit: Payer: Medicare HMO

## 2016-12-13 ENCOUNTER — Other Ambulatory Visit: Payer: Self-pay | Admitting: Family Medicine

## 2016-12-16 ENCOUNTER — Ambulatory Visit (INDEPENDENT_AMBULATORY_CARE_PROVIDER_SITE_OTHER): Payer: Medicare HMO | Admitting: Family

## 2016-12-16 ENCOUNTER — Ambulatory Visit (INDEPENDENT_AMBULATORY_CARE_PROVIDER_SITE_OTHER): Payer: Medicare HMO

## 2016-12-16 ENCOUNTER — Encounter: Payer: Self-pay | Admitting: Family

## 2016-12-16 VITALS — BP 128/76 | HR 82 | Temp 97.7°F | Ht 72.0 in | Wt 207.0 lb

## 2016-12-16 DIAGNOSIS — E11628 Type 2 diabetes mellitus with other skin complications: Secondary | ICD-10-CM

## 2016-12-16 DIAGNOSIS — E1169 Type 2 diabetes mellitus with other specified complication: Secondary | ICD-10-CM | POA: Diagnosis not present

## 2016-12-16 DIAGNOSIS — L089 Local infection of the skin and subcutaneous tissue, unspecified: Secondary | ICD-10-CM

## 2016-12-16 DIAGNOSIS — L03116 Cellulitis of left lower limb: Secondary | ICD-10-CM

## 2016-12-16 MED ORDER — SULFAMETHOXAZOLE-TRIMETHOPRIM 800-160 MG PO TABS: 1.0000 | ORAL_TABLET | Freq: Two times a day (BID) | ORAL | 0 refills | Status: DC

## 2016-12-16 MED ORDER — AMOXICILLIN-POT CLAVULANATE 875-125 MG PO TABS: 1.0000 | ORAL_TABLET | Freq: Two times a day (BID) | ORAL | 0 refills | Status: DC

## 2016-12-16 NOTE — Progress Notes (Signed)
   Subjective:    Patient ID: Keith Doyle, male    DOB: 09-28-1935, 81 y.o.   MRN: [id number]  HPI PT presents to the office today with redness and tenderness in right foot. Pt state he noticed it today. Pt denies any injury. He reports aching pain of 10 out 10 that is worse when he walks or moves his foot.   Pt was seen in the office  On 12/10/16 for perihernial edema. Pt's norvasc was decreased to 5 mg from 10 mg. Pt reports his swelling in his feet greatly improved since changing his medication. Pt's BP is at goal today.    Review of Systems  Skin: Positive for color change (right foot erythemas and tenderness).  All other systems reviewed and are negative.      Objective:   Physical Exam  Constitutional: He is oriented to person, place, and time. He appears well-developed and well-nourished. No distress.  HENT:  Head: Normocephalic.  Cardiovascular: Normal rate, regular rhythm, normal heart sounds and intact distal pulses.   No murmur heard. Pulmonary/Chest: Effort normal and breath sounds normal. No respiratory distress. He has no wheezes.  Abdominal: Soft. Bowel sounds are normal. He exhibits no distension. There is no tenderness.  Musculoskeletal: Normal range of motion. He exhibits edema and tenderness.  Erythemas of right foot, approx 10 cm X6 cm, warm to touch. Blister on medial ball of foot.   Neurological: He is alert and oriented to person, place, and time.  Skin: Skin is warm and dry. No rash noted. No erythema.  Psychiatric: He has a normal mood and affect. His behavior is normal. Judgment and thought content normal.  Vitals reviewed.     BP 128/76   Pulse 82   Temp 97.7 F (36.5 C) (Oral)   Ht 6' (1.829 m)   Wt 207 lb (93.9 kg)   BMI 28.07 kg/m      Assessment & Plan:  1. Cellulitis of left lower extremity - DG Foot Complete Right; Future - Ambulatory referral to Podiatry - amoxicillin-clavulanate (AUGMENTIN) 875-125 MG tablet; Take 1 tablet by mouth 2  (two) times daily.  Dispense: 20 tablet; Refill: 0 - sulfamethoxazole-trimethoprim (BACTRIM DS) 800-160 MG tablet; Take 1 tablet by mouth 2 (two) times daily.  Dispense: 20 tablet; Refill: 0  2. Diabetic foot infection ([place]) - DG Foot Complete Right; Future - Ambulatory referral to Podiatry - amoxicillin-clavulanate (AUGMENTIN) 875-125 MG tablet; Take 1 tablet by mouth 2 (two) times daily.  Dispense: 20 tablet; Refill: 0 - sulfamethoxazole-trimethoprim (BACTRIM DS) 800-160 MG tablet; Take 1 tablet by mouth 2 (two) times daily.  Dispense: 20 tablet; Refill: 0   Area marked if redness worsens call office Stat Podiatry referral pending Keep elevated PT needs to follow up with Dr.Bradshaw. Pt does not want a mid level seeing him and discussed that he needs to make appointments with MD.   Evelina Dun, FNP

## 2016-12-16 NOTE — Patient Instructions (Signed)
Cellulitis, Adult Cellulitis is a skin infection. The infected area is usually red and sore. This condition occurs most often in the arms and lower legs. It is very important to get treated for this condition. Follow these instructions at home:  Take over-the-counter and prescription medicines only as told by your doctor.  If you were prescribed an antibiotic medicine, take it as told by your doctor. Do not stop taking the antibiotic even if you start to feel better.  Drink enough fluid to keep your pee (urine) clear or pale yellow.  Do not touch or rub the infected area.  Raise (elevate) the infected area above the level of your heart while you are sitting or lying down.  Place warm or cold wet cloths (warm or cold compresses) on the infected area. Do this as told by your doctor.  Keep all follow-up visits as told by your doctor. This is important. These visits let your doctor make sure your infection is not getting worse. Contact a doctor if:  You have a fever.  Your symptoms do not get better after 1-2 days of treatment.  Your bone or joint under the infected area starts to hurt after the skin has healed.  Your infection comes back. This can happen in the same area or another area.  You have a swollen bump in the infected area.  You have new symptoms.  You feel ill and also have muscle aches and pains. Get help right away if:  Your symptoms get worse.  You feel very sleepy.  You throw up (vomit) or have watery poop (diarrhea) for a long time.  There are red streaks coming from the infected area.  Your red area gets larger.  Your red area turns darker. This information is not intended to replace advice given to you by your health care provider. Make sure you discuss any questions you have with your health care provider. Document Released: 02/09/2008 Document Revised: 01/29/2016 Document Reviewed: 07/02/2015 Elsevier Interactive Patient Education  2017 Elsevier  Inc.  

## 2016-12-20 ENCOUNTER — Encounter: Payer: Self-pay | Admitting: Family

## 2016-12-20 ENCOUNTER — Ambulatory Visit (INDEPENDENT_AMBULATORY_CARE_PROVIDER_SITE_OTHER): Payer: Medicare HMO | Admitting: Family

## 2016-12-20 VITALS — BP 114/71 | HR 79 | Temp 96.8°F | Ht 72.0 in | Wt 212.4 lb

## 2016-12-20 DIAGNOSIS — L03115 Cellulitis of right lower limb: Secondary | ICD-10-CM

## 2016-12-20 DIAGNOSIS — I480 Paroxysmal atrial fibrillation: Secondary | ICD-10-CM

## 2016-12-20 DIAGNOSIS — E11628 Type 2 diabetes mellitus with other skin complications: Secondary | ICD-10-CM

## 2016-12-20 DIAGNOSIS — L089 Local infection of the skin and subcutaneous tissue, unspecified: Secondary | ICD-10-CM | POA: Diagnosis not present

## 2016-12-20 DIAGNOSIS — E1140 Type 2 diabetes mellitus with diabetic neuropathy, unspecified: Secondary | ICD-10-CM | POA: Diagnosis not present

## 2016-12-20 LAB — COAGUCHEK XS/INR WAIVED
INR: 2.9 — ABNORMAL HIGH (ref 0.9–1.1)
Prothrombin Time: 34.8 s

## 2016-12-20 MED ORDER — CEFTRIAXONE SODIUM 1 G IJ SOLR
1.0000 g | Freq: Once | INTRAMUSCULAR | Status: AC
  Administered 2016-12-20: 1 g via INTRAMUSCULAR

## 2016-12-20 NOTE — Progress Notes (Signed)
   Subjective:    Patient ID: Keith Doyle, male    DOB: 1936-04-05, 81 y.o.   MRN: [id number]  HPI Pt presents to the office today to recheck left foot cellulitis and diabetic wound. Pt was seen on 12/16/16 and started on Bactrim and Augmentin. PT has not seen podiatry. States his foot is better, and did not think he needed to follow up. Pt states he "stuck a needle in my foot and got a lot of pus and blood".   Pt also would like his INR recheck today. Denies any bleeding.    Review of Systems  Skin: Positive for wound.  All other systems reviewed and are negative.      Objective:   Physical Exam  Constitutional: He is oriented to person, place, and time. He appears well-developed and well-nourished. No distress.  HENT:  Head: Normocephalic.  Eyes: Pupils are equal, round, and reactive to light. Right eye exhibits no discharge. Left eye exhibits no discharge.  Neck: Normal range of motion. Neck supple. No thyromegaly present.  Cardiovascular: Normal rate, regular rhythm, normal heart sounds and intact distal pulses.   No murmur heard. Pulmonary/Chest: Effort normal and breath sounds normal. No respiratory distress. He has no wheezes.  Abdominal: Soft. Bowel sounds are normal. He exhibits no distension. There is no tenderness.  Musculoskeletal: Normal range of motion. He exhibits no edema or tenderness.  Neurological: He is alert and oriented to person, place, and time.  Skin: Skin is warm and dry. No rash noted. There is erythema.  Right foot erythemas, diabetic ulcer on right ball 4 cmX3.5cm, erythemas extending into dorsal foot 7 cm  Psychiatric: He has a normal mood and affect. His behavior is normal. Judgment and thought content normal.  Vitals reviewed.   BP 114/71   Pulse 79   Temp (!) 96.8 F (36 C) (Oral)   Ht 6' (1.829 m)   Wt 212 lb 6.4 oz (96.3 kg)   BMI 28.81 kg/m       Assessment & Plan:  1. Paroxysmal A-fib (HCC) - CoaguChek XS/INR Waived - cefTRIAXone  (ROCEPHIN) injection 1 g; Inject 1 g into the muscle once.  2. Type 2 diabetes mellitus with diabetic neuropathy, unspecified whether long term insulin use (HCC) - cefTRIAXone (ROCEPHIN) injection 1 g; Inject 1 g into the muscle once.  3. Cellulitis of right lower extremity - cefTRIAXone (ROCEPHIN) injection 1 g; Inject 1 g into the muscle once.  4. Diabetic foot infection ([place]) - cefTRIAXone (ROCEPHIN) injection 1 g; Inject 1 g into the muscle once.  5. Paroxysmal atrial fibrillation (HCC) - CoaguChek XS/INR Waived - cefTRIAXone (ROCEPHIN) injection 1 g; Inject 1 g into the muscle once. \ Anticoagulation Dose Instructions as of 12/20/2016      Dorene Grebe Tue Wed Thu Fri Sat   New Dose 5 mg 5 mg 5 mg 5 mg 5 mg 5 mg 5 mg    Description   Continue warfarin dose to 5mg  - take 1 tablet today.        Continue antibiotics  Keep foot elevated Do not pick at Keep Podiatry appt tomorrow!! Report any increase erythemas, warmth, or discharge Follow up with Tammy in 2 weeks  Evelina Dun, FNP

## 2016-12-20 NOTE — Patient Instructions (Signed)

## 2016-12-21 DIAGNOSIS — R601 Generalized edema: Secondary | ICD-10-CM | POA: Diagnosis not present

## 2016-12-21 DIAGNOSIS — L03031 Cellulitis of right toe: Secondary | ICD-10-CM | POA: Diagnosis not present

## 2016-12-23 ENCOUNTER — Encounter: Payer: Self-pay | Admitting: Pharmacist

## 2016-12-30 DIAGNOSIS — L03031 Cellulitis of right toe: Secondary | ICD-10-CM | POA: Diagnosis not present

## 2016-12-30 DIAGNOSIS — R601 Generalized edema: Secondary | ICD-10-CM | POA: Diagnosis not present

## 2017-01-04 ENCOUNTER — Other Ambulatory Visit: Payer: Self-pay | Admitting: *Deleted

## 2017-01-04 MED ORDER — GABAPENTIN 400 MG PO CAPS: 800.0000 mg | ORAL_CAPSULE | Freq: Two times a day (BID) | ORAL | 1 refills | Status: DC

## 2017-01-13 ENCOUNTER — Ambulatory Visit: Payer: Medicare HMO

## 2017-01-13 ENCOUNTER — Ambulatory Visit (INDEPENDENT_AMBULATORY_CARE_PROVIDER_SITE_OTHER): Payer: Medicare HMO | Admitting: *Deleted

## 2017-01-13 VITALS — BP 131/89 | HR 80 | Temp 96.7°F | Resp 20 | Ht 71.0 in | Wt 208.2 lb

## 2017-01-13 DIAGNOSIS — Z0000 Encounter for general adult medical examination without abnormal findings: Secondary | ICD-10-CM

## 2017-01-13 DIAGNOSIS — E1140 Type 2 diabetes mellitus with diabetic neuropathy, unspecified: Secondary | ICD-10-CM | POA: Diagnosis not present

## 2017-01-13 DIAGNOSIS — Z23 Encounter for immunization: Secondary | ICD-10-CM
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
## 2017-01-13 NOTE — Patient Instructions (Addendum)
Keith Doyle , Thank you for taking time to come for your Medicare Wellness Visit. I appreciate your ongoing commitment to your health goals. Please review the following plan we discussed and let me know if I can assist you in the future.   These are the goals we discussed: Goals    None      This is a list of the screening recommended for you and due dates:  Health Maintenance  Topic Date Due  . DEXA scan (bone density measurement)  10/25/2008  . Eye exam for diabetics  10/04/2015  . Complete foot exam   03/01/2017  . Flu Shot  04/06/2017  . Hemoglobin A1C  04/07/2017  . Tetanus Vaccine  03/01/2021  . Pneumonia vaccines  Completed     Eat 3 Meals daily  Try to cut out carbohydrates and sugary foods. (Breads, pasta, sugary drinks) Eat More fruits and vegetables Try to eat at home more Keep Exercising 2 times daily or at least 30 minutes 3 times weekly Check Blood blood sugars daily Go over Advance Directive and Power of Attorney with [place]     Diabetes Mellitus and Food It is important for you to manage your blood sugar (glucose) level. Your blood glucose level can be greatly affected by what you eat. Eating healthier foods in the appropriate amounts throughout the day at about the same time each day will help you control your blood glucose level. It can also help slow or prevent worsening of your diabetes mellitus. Healthy eating may even help you improve the level of your blood pressure and reach or maintain a healthy weight. General recommendations for healthful eating and cooking habits include:  Eating meals and snacks regularly. Avoid going long periods of time without eating to lose weight.  Eating a diet that consists mainly of plant-based foods, such as fruits, vegetables, nuts, legumes, and whole grains.  Using low-heat cooking methods, such as baking, instead of high-heat cooking methods, such as deep frying. Work with your dietitian to make sure you understand how to  use the Nutrition Facts information on food labels. How can food affect me? Carbohydrates  Carbohydrates affect your blood glucose level more than any other type of food. Your dietitian will help you determine how many carbohydrates to eat at each meal and teach you how to count carbohydrates. Counting carbohydrates is important to keep your blood glucose at a healthy level, especially if you are using insulin or taking certain medicines for diabetes mellitus. Alcohol  Alcohol can cause sudden decreases in blood glucose (hypoglycemia), especially if you use insulin or take certain medicines for diabetes mellitus. Hypoglycemia can be a life-threatening condition. Symptoms of hypoglycemia (sleepiness, dizziness, and disorientation) are similar to symptoms of having too much alcohol. If your health care provider has given you approval to drink alcohol, do so in moderation and use the following guidelines:  Women should not have more than one drink per day, and men should not have more than two drinks per day. One drink is equal to:  12 oz of beer.  5 oz of wine.  1 oz of hard liquor.  Do not drink on an empty stomach.  Keep yourself hydrated. Have water, diet soda, or unsweetened iced tea.  Regular soda, juice, and other mixers might contain a lot of carbohydrates and should be counted. What foods are not recommended? As you make food choices, it is important to remember that all foods are not the same. Some foods have fewer  nutrients per serving than other foods, even though they might have the same number of calories or carbohydrates. It is difficult to get your body what it needs when you eat foods with fewer nutrients. Examples of foods that you should avoid that are high in calories and carbohydrates but low in nutrients include:  Trans fats (most processed foods list trans fats on the Nutrition Facts label).  Regular soda.  Juice.  Candy.  Sweets, such as cake, pie, doughnuts, and  cookies.  Fried foods. What foods can I eat? Eat nutrient-rich foods, which will nourish your body and keep you healthy. The food you should eat also will depend on several factors, including:  The calories you need.  The medicines you take.  Your weight.  Your blood glucose level.  Your blood pressure level.  Your cholesterol level. You should eat a variety of foods, including:  Protein.  Lean cuts of meat.  Proteins low in saturated fats, such as fish, egg whites, and beans. Avoid processed meats.  Fruits and vegetables.  Fruits and vegetables that may help control blood glucose levels, such as apples, mangoes, and yams.  Dairy products.  Choose fat-free or low-fat dairy products, such as milk, yogurt, and cheese.  Grains, bread, pasta, and rice.  Choose whole grain products, such as multigrain bread, whole oats, and brown rice. These foods may help control blood pressure.  Fats.  Foods containing healthful fats, such as nuts, avocado, olive oil, canola oil, and fish. Does everyone with diabetes mellitus have the same meal plan? Because every person with diabetes mellitus is different, there is not one meal plan that works for everyone. It is very important that you meet with a dietitian who will help you create a meal plan that is just right for you. This information is not intended to replace advice given to you by your health care provider. Make sure you discuss any questions you have with your health care provider. Document Released: 05/20/2005 Document Revised: 01/29/2016 Document Reviewed: 07/20/2013 Elsevier Interactive Patient Education  2017 [employer] Directive Advance directives are legal documents that let you make choices ahead of time about your health care and medical treatment in case you become unable to communicate for yourself. Advance directives are a way for you to communicate your wishes to family, friends, and health care  providers. This can help convey your decisions about end-of-life care if you become unable to communicate. Discussing and writing advance directives should happen over time rather than all at once. Advance directives can be changed depending on your situation and what you want, even after you have signed the advance directives. If you do not have an advance directive, some states assign family decision makers to act on your behalf based on how closely you are related to them. Each state has its own laws regarding advance directives. You may want to check with your health care provider, attorney, or state representative about the laws in your state. There are different types of advance directives, such as: Medical power of attorney. Living will. Do not resuscitate (DNR) or do not attempt resuscitation (DNAR) order. Health care proxy and medical power of attorney A health care proxy, also called a health care agent, is a person who is appointed to make medical decisions for you in cases in which you are unable to make the decisions yourself. Generally, people choose someone they know well and trust to represent their preferences. Make sure to ask  this person for an agreement to act as your proxy. A proxy may have to exercise judgment in the event of a medical decision for which your wishes are not known. A medical power of attorney is a legal document that names your health care proxy. Depending on the laws in your state, after the document is written, it may also need to be: Signed. Notarized. Dated. Copied. Witnessed. Incorporated into your medical record. You may also want to appoint someone to manage your financial affairs in a situation in which you are unable to do so. This is called a durable power of attorney for finances. It is a separate legal document from the durable power of attorney for health care. You may choose the same person or someone different from your health care proxy to act as  your agent in financial matters. If you do not appoint a proxy, or if there is a concern that the proxy is not acting in your best interests, a court-appointed guardian may be designated to act on your behalf. Living will A living will is a set of instructions documenting your wishes about medical care when you cannot express them yourself. Health care providers should keep a copy of your living will in your medical record. You may want to give a copy to family members or friends. To alert caregivers in case of an emergency, you can place a card in your wallet to let them know that you have a living will and where they can find it. A living will is used if you become: Terminally ill. Incapacitated. Unable to communicate or make decisions. Items to consider in your living will include: The use or non-use of life-sustaining equipment, such as dialysis machines and breathing machines (ventilators). A DNR or DNAR order, which is the instruction not to use cardiopulmonary resuscitation (CPR) if breathing or heartbeat stops. The use or non-use of tube feeding. Withholding of food and fluids. Comfort (palliative) care when the goal becomes comfort rather than a cure. Organ and tissue donation. A living will does not give instructions for distributing your money and property if you should pass away. It is recommended that you seek the advice of a lawyer when writing a will. Decisions about taxes, beneficiaries, and asset distribution will be legally binding. This process can relieve your family and friends of any concerns surrounding disputes or questions that may come up about the distribution of your assets. DNR or DNAR A DNR or DNAR order is a request not to have CPR in the event that your heart stops beating or you stop breathing. If a DNR or DNAR order has not been made and shared, a health care provider will try to help any patient whose heart has stopped or who has stopped breathing. If you plan to  have surgery, talk with your health care provider about how your DNR or DNAR order will be followed if problems occur. Summary Advance directives are the legal documents that allow you to make choices ahead of time about your health care and medical treatment in case you become unable to communicate for yourself. The process of discussing and writing advance directives should happen over time. You can change the advance directives, even after you have signed them. Advance directives include DNR or DNAR orders, living wills, and designating an agent as your medical power of attorney. This information is not intended to replace advice given to you by your health care provider. Make sure you discuss any questions you have  with your health care provider. Document Released: 11/30/2007 Document Revised: 07/12/2016 Document Reviewed: 07/12/2016 Elsevier Interactive Patient Education  2017 [employer].

## 2017-01-14 LAB — MICROALBUMIN / CREATININE URINE RATIO
CREATININE, UR: 13.1 mg/dL
Microalb/Creat Ratio: <22.9 mg/g creat (ref 0.0–30.0)

## 2017-01-19 ENCOUNTER — Encounter: Payer: Self-pay | Admitting: Pharmacist

## 2017-01-19 ENCOUNTER — Ambulatory Visit: Payer: Medicare HMO

## 2017-01-19 ENCOUNTER — Ambulatory Visit (INDEPENDENT_AMBULATORY_CARE_PROVIDER_SITE_OTHER): Payer: Medicare HMO | Admitting: Pharmacist

## 2017-01-19 DIAGNOSIS — I480 Paroxysmal atrial fibrillation: Secondary | ICD-10-CM

## 2017-01-19 DIAGNOSIS — E1140 Type 2 diabetes mellitus with diabetic neuropathy, unspecified: Secondary | ICD-10-CM

## 2017-01-19 LAB — BAYER DCA HB A1C WAIVED: HB A1C (BAYER DCA - WAIVED): 9.7 % — ABNORMAL HIGH (ref <7.0)

## 2017-01-19 LAB — COAGUCHEK XS/INR WAIVED
INR: 2.6 — ABNORMAL HIGH (ref 0.9–1.1)
Prothrombin Time: 31.3 s

## 2017-01-19 LAB — GLUCOSE HEMOCUE WAIVED: GLU HEMOCUE WAIVED: 172 mg/dL — AB (ref 65–99)

## 2017-01-19 MED ORDER — SITAGLIP PHOS-METFORMIN HCL ER 100-1000 MG PO TB24: 1.0000 | ORAL_TABLET | Freq: Every day | ORAL | 1 refills | Status: DC

## 2017-01-19 NOTE — Progress Notes (Signed)
Subjective:   CC: Mr. Dettman is here today to recheck INR / chronic anticoagulation due to atrial fibrillation.  He also was referred by one of our nurses during his Annual Wellness Visit for diabetes education.  He has questions about diet and diabetes.    Indication: atrial fibrillation Bleeding signs/symptoms: None Thromboembolic signs/symptoms: None  Missed Coumadin doses: None Medication changes: no Dietary changes: yes - over the last week patient has been eating more weenies and cabbage because he found that his BG was lower when he ate these foods. Bacterial/viral infection: no Other concerns: yes - BG and diabetes  Objective:    INR Today: 2.6 A1c = 9.7% today FBG was 172 today  Current dose: warfarin 5mg  qd    Assessment:    Therapeutic INR for goal of 2-3   Type 2 DM - uncontrolled   Plan:    1. New dose: no change   2.  Discussed BG goals 3.  Change metformin to Janumet XR 100/1000mg  qd with food 4.   Discussed plate method for limiting CHO intake - patient to limit serving sizes of straches, fruits and milk / yogurt.  Increase intake of non starch vegetables and lean proteins.  5.  Continue to checked BG daily 6.  RTC 06/21/218 to see Dr Wendi Snipes and 04/07/2017 clinical pharmacist      Orders Placed This Encounter  Procedures  . Bayer DCA Hb A1c Waived  . Glucose Hemocue Waived     Patient ID: Keith Doyle, male   DOB: Feb 03, 1936, 81 y.o.   MRN: [id number]

## 2017-01-20 ENCOUNTER — Ambulatory Visit: Payer: Medicare HMO

## 2017-02-14 ENCOUNTER — Other Ambulatory Visit: Payer: Self-pay | Admitting: Family Medicine

## 2017-02-24 ENCOUNTER — Ambulatory Visit (INDEPENDENT_AMBULATORY_CARE_PROVIDER_SITE_OTHER): Payer: Medicare HMO | Admitting: Family Medicine

## 2017-02-24 ENCOUNTER — Encounter: Payer: Self-pay | Admitting: Family Medicine

## 2017-02-24 VITALS — BP 124/78 | HR 80 | Temp 97.8°F | Ht 71.0 in | Wt 210.6 lb

## 2017-02-24 DIAGNOSIS — M25562 Pain in left knee: Secondary | ICD-10-CM | POA: Diagnosis not present

## 2017-02-24 DIAGNOSIS — M25561 Pain in right knee: Secondary | ICD-10-CM

## 2017-02-24 DIAGNOSIS — E1140 Type 2 diabetes mellitus with diabetic neuropathy, unspecified: Secondary | ICD-10-CM | POA: Diagnosis not present

## 2017-02-24 DIAGNOSIS — I480 Paroxysmal atrial fibrillation: Secondary | ICD-10-CM

## 2017-02-24 DIAGNOSIS — G8929 Other chronic pain: Secondary | ICD-10-CM

## 2017-02-24 LAB — COAGUCHEK XS/INR WAIVED
INR: 2.7 — ABNORMAL HIGH (ref 0.9–1.1)
PROTHROMBIN TIME: 32.2 s

## 2017-02-24 NOTE — Progress Notes (Signed)
   HPI  Patient presents today to discuss knee pain, diabetes, and anticoagulation with A. fib. Anticoagulation No bleeding Good medication compliance. Atrial fibrillation-no palpitations, good medication compliance.  Patient with intermittent chronic knee pain. Patient states that every 4 months for the last 4 years or so he has been getting cortisone injections. He requests these today. He has been using a home exercise plan by physical therapy and states that he is doing much better. He does dancing every week without limitation. He denies any severe knee pain today.   Type 2 diabetes Reports fasting blood sugar of 100 150 Reports good medication compliance, does have frequent exposure to steroids.  PMH: Smoking status noted ROS: Per HPI  Objective: BP 124/78   Pulse 80   Temp 97.8 F (36.6 C) (Oral)   Ht 5\' 11"  (1.803 m)   Wt 210 lb 9.6 oz (95.5 kg)   BMI 29.37 kg/m  Gen: NAD, alert, cooperative with exam HEENT: NCAT CV: RRR, good S1/S2, no murmur Resp: CTABL, no wheezes, non-labored Ext: No edema, warm Neuro: Alert and oriented, No gross deficits MSK: L knee without erythema, effusion, bruising, or gross deformity No joint line tenderness.  ligamentously intact to Lachman's and with varus and valgus stress.  Negative McMurray's test   Assessment and plan:  # Chronic bilateral knee pain Chronic pain, now actually improved after many steroid injections. Patient requesting continued injections both knees every 4 months. I recommended seeing orthopedic surgery for their opinion Consider viscous supplementation  His most recent in the x-rays have been from 2006 which showed tibial plateau fracture of the right and degenerative changes. No need for repeat x-rays today, referral to orthopedics  #Paroxysmal atrial fibrillation Rate controlled, no medications are Cardura for this. INR is therapeutic today, and changes  # Type 2 diabetes Severe,  uncontrolled Previous A1c was over 9, 1 month ago Discussed reducing steroid burden, reviewed his medication regimen, he sounds like he has good medication compliance Consider appointment with her pharmacist versus referral to endocrinology if no improvement in 2 months      Orders Placed This Encounter  Procedures  . Ambulatory referral to Orthopedic Surgery    Referral Priority:   Routine    Referral Type:   Surgical    Referral Reason:   Specialty Services Required    Requested Specialty:   Orthopedic Surgery    Number of Visits Requested:   [place], MD [facility] 02/24/2017, 10:17 AM

## 2017-03-03 ENCOUNTER — Other Ambulatory Visit: Payer: Self-pay | Admitting: Family Medicine

## 2017-03-03 ENCOUNTER — Other Ambulatory Visit: Payer: Self-pay | Admitting: Cardiology

## 2017-03-07 ENCOUNTER — Telehealth: Payer: Self-pay | Admitting: Family Medicine

## 2017-03-07 NOTE — Telephone Encounter (Signed)
Copied labs and put them in the mail

## 2017-03-11 ENCOUNTER — Other Ambulatory Visit: Payer: Self-pay | Admitting: Family Medicine

## 2017-03-21 ENCOUNTER — Other Ambulatory Visit: Payer: Self-pay | Admitting: Family

## 2017-03-21 ENCOUNTER — Other Ambulatory Visit: Payer: Self-pay | Admitting: Family Medicine

## 2017-03-21 MED ORDER — POTASSIUM CHLORIDE ER 20 MEQ PO TBCR: EXTENDED_RELEASE_TABLET | ORAL | 1 refills | Status: DC

## 2017-03-31 ENCOUNTER — Ambulatory Visit (INDEPENDENT_AMBULATORY_CARE_PROVIDER_SITE_OTHER): Payer: Medicare HMO

## 2017-03-31 ENCOUNTER — Other Ambulatory Visit (INDEPENDENT_AMBULATORY_CARE_PROVIDER_SITE_OTHER): Payer: Medicare HMO

## 2017-03-31 ENCOUNTER — Other Ambulatory Visit: Payer: Self-pay | Admitting: Orthopedic Surgery

## 2017-03-31 DIAGNOSIS — M170 Bilateral primary osteoarthritis of knee: Secondary | ICD-10-CM | POA: Diagnosis not present

## 2017-03-31 DIAGNOSIS — R52 Pain, unspecified: Secondary | ICD-10-CM

## 2017-03-31 DIAGNOSIS — G8929 Other chronic pain: Secondary | ICD-10-CM | POA: Diagnosis not present

## 2017-03-31 DIAGNOSIS — M1712 Unilateral primary osteoarthritis, left knee: Secondary | ICD-10-CM | POA: Diagnosis not present

## 2017-03-31 DIAGNOSIS — M25562 Pain in left knee: Secondary | ICD-10-CM

## 2017-03-31 DIAGNOSIS — M1711 Unilateral primary osteoarthritis, right knee: Secondary | ICD-10-CM | POA: Diagnosis not present

## 2017-03-31 DIAGNOSIS — M25561 Pain in right knee: Secondary | ICD-10-CM

## 2017-04-04 ENCOUNTER — Encounter (HOSPITAL_COMMUNITY): Payer: Medicare HMO | Attending: Oncology | Admitting: Oncology

## 2017-04-04 ENCOUNTER — Encounter (HOSPITAL_COMMUNITY): Payer: Medicare HMO

## 2017-04-04 ENCOUNTER — Encounter (HOSPITAL_COMMUNITY): Payer: Self-pay

## 2017-04-04 VITALS — BP 126/80 | HR 87 | Resp 16 | Ht 72.0 in | Wt 206.0 lb

## 2017-04-04 DIAGNOSIS — Z9889 Other specified postprocedural states: Secondary | ICD-10-CM | POA: Insufficient documentation

## 2017-04-04 DIAGNOSIS — E1140 Type 2 diabetes mellitus with diabetic neuropathy, unspecified: Secondary | ICD-10-CM | POA: Diagnosis not present

## 2017-04-04 DIAGNOSIS — Z8249 Family history of ischemic heart disease and other diseases of the circulatory system: Secondary | ICD-10-CM | POA: Insufficient documentation

## 2017-04-04 DIAGNOSIS — D500 Iron deficiency anemia secondary to blood loss (chronic): Secondary | ICD-10-CM | POA: Diagnosis not present

## 2017-04-04 DIAGNOSIS — Z7901 Long term (current) use of anticoagulants: Secondary | ICD-10-CM | POA: Insufficient documentation

## 2017-04-04 DIAGNOSIS — I4891 Unspecified atrial fibrillation: Secondary | ICD-10-CM | POA: Diagnosis not present

## 2017-04-04 DIAGNOSIS — N400 Enlarged prostate without lower urinary tract symptoms: Secondary | ICD-10-CM | POA: Diagnosis not present

## 2017-04-04 DIAGNOSIS — Z833 Family history of diabetes mellitus: Secondary | ICD-10-CM | POA: Insufficient documentation

## 2017-04-04 DIAGNOSIS — Z8673 Personal history of transient ischemic attack (TIA), and cerebral infarction without residual deficits: Secondary | ICD-10-CM | POA: Insufficient documentation

## 2017-04-04 DIAGNOSIS — Z86010 Personal history of colonic polyps: Secondary | ICD-10-CM | POA: Diagnosis not present

## 2017-04-04 DIAGNOSIS — E561 Deficiency of vitamin K: Secondary | ICD-10-CM | POA: Diagnosis not present

## 2017-04-04 DIAGNOSIS — E785 Hyperlipidemia, unspecified: Secondary | ICD-10-CM | POA: Insufficient documentation

## 2017-04-04 DIAGNOSIS — K254 Chronic or unspecified gastric ulcer with hemorrhage: Secondary | ICD-10-CM | POA: Diagnosis not present

## 2017-04-04 DIAGNOSIS — D509 Iron deficiency anemia, unspecified: Secondary | ICD-10-CM | POA: Diagnosis present

## 2017-04-04 DIAGNOSIS — Z8042 Family history of malignant neoplasm of prostate: Secondary | ICD-10-CM | POA: Diagnosis not present

## 2017-04-04 DIAGNOSIS — I10 Essential (primary) hypertension: Secondary | ICD-10-CM | POA: Diagnosis not present

## 2017-04-04 DIAGNOSIS — Z825 Family history of asthma and other chronic lower respiratory diseases: Secondary | ICD-10-CM | POA: Insufficient documentation

## 2017-04-04 LAB — CBC WITH DIFFERENTIAL/PLATELET
BASOS ABS: 0 10*3/uL (ref 0.0–0.1)
Basophils Relative: 1 %
EOS ABS: 0.3 10*3/uL (ref 0.0–0.7)
EOS PCT: 6 %
HCT: 34.4 % — ABNORMAL LOW (ref 39.0–52.0)
HEMOGLOBIN: 10.9 g/dL — AB (ref 13.0–17.0)
LYMPHS ABS: 1.7 10*3/uL (ref 0.7–4.0)
Lymphocytes Relative: 30 %
MCH: 29.6 pg (ref 26.0–34.0)
MCHC: 31.7 g/dL (ref 30.0–36.0)
MCV: 93.5 fL (ref 78.0–100.0)
Monocytes Absolute: 0.4 10*3/uL (ref 0.1–1.0)
Monocytes Relative: 7 %
NEUTROS PCT: 56 %
Neutro Abs: 3.1 10*3/uL (ref 1.7–7.7)
PLATELETS: 214 10*3/uL (ref 150–400)
RBC: 3.68 MIL/uL — AB (ref 4.22–5.81)
RDW: 15.5 % (ref 11.5–15.5)
WBC: 5.6 10*3/uL (ref 4.0–10.5)

## 2017-04-04 LAB — COMPREHENSIVE METABOLIC PANEL
ALBUMIN: 3.6 g/dL (ref 3.5–5.0)
ALK PHOS: 37 U/L — AB (ref 38–126)
ALT: 21 U/L (ref 17–63)
AST: 19 U/L (ref 15–41)
Anion gap: 7 (ref 5–15)
BUN: 13 mg/dL (ref 6–20)
CALCIUM: 9.5 mg/dL (ref 8.9–10.3)
CHLORIDE: 105 mmol/L (ref 101–111)
CO2: 27 mmol/L (ref 22–32)
CREATININE: 0.9 mg/dL (ref 0.61–1.24)
GFR calc non Af Amer: >60 mL/min (ref >60)
GLUCOSE: 157 mg/dL — AB (ref 65–99)
Potassium: 3.8 mmol/L (ref 3.5–5.1)
SODIUM: 139 mmol/L (ref 135–145)
Total Bilirubin: 0.4 mg/dL (ref 0.3–1.2)
Total Protein: 6.4 g/dL — ABNORMAL LOW (ref 6.5–8.1)

## 2017-04-04 LAB — IRON AND TIBC
Iron: 49 ug/dL (ref 45–182)
Saturation Ratios: 12 % — ABNORMAL LOW (ref 17.9–39.5)
TIBC: 399 ug/dL (ref 250–450)
UIBC: 350 ug/dL

## 2017-04-04 LAB — FERRITIN: FERRITIN: 33 ng/mL (ref 24–336)
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
## 2017-04-05 NOTE — Progress Notes (Signed)
Please mail a copy of patient's CBC, CMP, iron studies to him, per his request.  Also please call him and tell him to increase his oral iron to 3 times a day after each meal.

## 2017-04-06 ENCOUNTER — Other Ambulatory Visit: Payer: Self-pay | Admitting: *Deleted

## 2017-04-06 MED ORDER — BD SWAB SINGLE USE REGULAR PADS: MEDICATED_PAD | 2 refills | Status: DC

## 2017-04-06 MED ORDER — FUROSEMIDE 20 MG PO TABS: ORAL_TABLET | ORAL | 1 refills | Status: DC

## 2017-04-07 ENCOUNTER — Ambulatory Visit (INDEPENDENT_AMBULATORY_CARE_PROVIDER_SITE_OTHER): Payer: Medicare HMO | Admitting: Pharmacist

## 2017-04-07 VITALS — BP 128/74 | HR 72 | Ht 74.0 in | Wt 204.0 lb

## 2017-04-07 DIAGNOSIS — D509 Iron deficiency anemia, unspecified: Secondary | ICD-10-CM

## 2017-04-07 DIAGNOSIS — I480 Paroxysmal atrial fibrillation: Secondary | ICD-10-CM | POA: Diagnosis not present

## 2017-04-07 DIAGNOSIS — E1140 Type 2 diabetes mellitus with diabetic neuropathy, unspecified: Secondary | ICD-10-CM | POA: Diagnosis not present

## 2017-04-07 LAB — COAGUCHEK XS/INR WAIVED
INR: 2.1 — AB (ref 0.9–1.1)
PROTHROMBIN TIME: 24.9 s

## 2017-04-07 LAB — BAYER DCA HB A1C WAIVED: HB A1C (BAYER DCA - WAIVED): 6.2 % (ref <7.0)

## 2017-04-07 MED ORDER — WARFARIN SODIUM 5 MG PO TABS: 5.0000 mg | ORAL_TABLET | Freq: Every day | ORAL | 0 refills | Status: DC

## 2017-04-07 MED ORDER — FERROUS SULFATE 325 (65 FE) MG PO TABS: 325.0000 mg | ORAL_TABLET | Freq: Three times a day (TID) | ORAL | Status: DC

## 2017-04-07 NOTE — Patient Instructions (Signed)
Anticoagulation Warfarin Dose Instructions as of 04/07/2017      Keith Doyle Tue Wed Thu Fri Sat   New Dose 5 mg 5 mg 5 mg 5 mg 5 mg 5 mg 5 mg    Description   Continue current warfarin 5mg  dose - take 1 tablet daily  INR was 2.1 today (goal is 2.0 to 3.0)       Great job with blood glucose! Your A1c is down to 6.2% from 9.7%  Continue to avoid sugar and check blood glucose once a day  Per dr Talbert Cage - increase iron supplement to 1 tablet 3 times a day

## 2017-04-07 NOTE — Progress Notes (Signed)
Patient ID: Keith Doyle, male   DOB: 12/14/1935, 81 y.o.   MRN: [id number]   Subjective:    Keith Doyle is a 81 y.o. male who presents for a follow up evaluation of Type 2 diabetes mellitus and to recheck INR.  Keith Doyle is taking warfarin 5mg  1 tablet daily for anticoagulation secondary to atrial fibrillation.   He denies any s/s of bleeding but did have questions about CBC results checked 04/05/17 by hematologist.    Diabetes: Current symptoms/problems include none. His last A1c was 9.7% but since May he has reduces in intake of high CHO foods and sugar. BG has greatly improved.   Current diabetic medications include none.   Reviewed his HBG monitor:  Patient is checking BG 1 to 2 times per day.   90 d avg = 178 30 d avg = 133 14 d avg = 134 7 d avg = 133 HBG readings - 137, 118, 139, 138, 157, 147, 159, 131, 142, 127, 114, 95, 123, 149, 172, 128, 144  Is He on ACE inhibitor or angiotensin II receptor blocker?  Yes  benazepril (Lotensin)    The following portions of the patient's history were reviewed and updated as appropriate: allergies, current medications and problem list.    Objective:    BP 128/74   Pulse 72   Ht 6\' 2"  (1.88 m)   Wt 204 lb (92.5 kg)   BMI 26.19 kg/m   A1c = 6.2% today INR = 2.1 today  CBC    Component Value Date/Time   WBC 5.6 04/04/2017 1120   RBC 3.68 (L) 04/04/2017 1120   HGB 10.9 (L) 04/04/2017 1120   HGB 12.8 07/16/2016 1717   HCT 34.4 (L) 04/04/2017 1120   HCT 38.1 07/16/2016 1717   PLT 214 04/04/2017 1120   PLT 222 07/16/2016 1717   MCV 93.5 04/04/2017 1120   MCV 94 07/16/2016 1717   MCH 29.6 04/04/2017 1120   MCHC 31.7 04/04/2017 1120   RDW 15.5 04/04/2017 1120   RDW 14.7 07/16/2016 1717   LYMPHSABS 1.7 04/04/2017 1120   LYMPHSABS 2.1 07/16/2016 1717   MONOABS 0.4 04/04/2017 1120   EOSABS 0.3 04/04/2017 1120   EOSABS 0.3 07/16/2016 1717   BASOSABS 0.0 04/04/2017 1120   BASOSABS 0.1 07/16/2016 1717   BMET    Component Value  Date/Time   NA 139 04/04/2017 1120   NA 141 10/08/2016 1112   K 3.8 04/04/2017 1120   CL 105 04/04/2017 1120   CO2 27 04/04/2017 1120   GLUCOSE 157 (H) 04/04/2017 1120   BUN 13 04/04/2017 1120   BUN 11 10/08/2016 1112   CREATININE 0.90 04/04/2017 1120   CREATININE 1.23 10/31/2013 1545   CALCIUM 9.5 04/04/2017 1120   GFRNONAA >60 04/04/2017 1120   GFRAA >60 04/04/2017 1120       Assessment:    Type 2 DM diet controlled - improving Therapeutic anticoagulation Anemia   Plan:    1.  Rx changes: per Dr Keith Doyle lab noted instructed patient to increase iron supplement to tid    Continue warfarin 5mg  qd 2.  Discussed all recent lab results with patinet 3. Continue current CHO counting diet  4.  Continue to check BG daily 5.  Follow up: RTC in 1 month for protime and CBC recheck;  RTC in 2 months to see PCP

## 2017-05-05 ENCOUNTER — Ambulatory Visit (INDEPENDENT_AMBULATORY_CARE_PROVIDER_SITE_OTHER): Payer: Medicare HMO | Admitting: Family Medicine

## 2017-05-05 VITALS — BP 126/75 | HR 95 | Temp 98.4°F | Ht 74.0 in | Wt 210.0 lb

## 2017-05-05 DIAGNOSIS — R21 Rash and other nonspecific skin eruption: Secondary | ICD-10-CM | POA: Diagnosis not present

## 2017-05-05 MED ORDER — TRIAMCINOLONE ACETONIDE 0.1 % EX CREA: 1.0000 "application " | TOPICAL_CREAM | Freq: Two times a day (BID) | CUTANEOUS | 0 refills | Status: DC

## 2017-05-05 MED ORDER — TRIAMCINOLONE ACETONIDE 0.5 % EX OINT: 1.0000 "application " | TOPICAL_OINTMENT | Freq: Two times a day (BID) | CUTANEOUS | 0 refills | Status: DC

## 2017-05-05 NOTE — Patient Instructions (Signed)
Great to see you!   

## 2017-05-05 NOTE — Progress Notes (Signed)
   HPI  Patient presents today here with an itchy rash.  Patient states that symptoms been present for about one week and seemed to be stable. Not changing. Patient states that they improve with 1% hydrocortisone and psoriasis cream. Psoriasis creams over-the-counter and has salicylic acid  He denies any fever, chills, sweats. The rash is itchy on bilateral legs and bilateral popliteal fossa  PMH: Smoking status noted ROS: Per HPI  Objective: BP 126/75   Pulse 95   Temp 98.4 F (36.9 C) (Oral)   Ht 6\' 2"  (1.88 m)   Wt 210 lb (95.3 kg)   BMI 26.96 kg/m  Gen: NAD, alert, cooperative with exam HEENT: NCAT CV: RRR, good S1/S2 Resp: CTABL, no wheezes, non-labored Ext: No edema, warm Neuro: Alert and oriented, No gross deficits Skin:  Erythematous slightly raised lesions on bilateral popliteal fossa, bilateral lateral thighs, and right lower extremity with diffuse border and excoriations present  Assessment and plan:  # Rash Itchy rash on bilateral popliteal fossa and distributed mostly along the legs Suspect contact dermatitis Treat with Kenalog ointment, I avoided systemic steroids given anticoagulation and diabetes. Follow-up as needed    Meds ordered this encounter  Medications  . DISCONTD: triamcinolone cream (KENALOG) 0.1 %    Sig: Apply 1 application topically 2 (two) times daily.    Dispense:  80 g    Refill:  0  . triamcinolone ointment (KENALOG) 0.5 %    Sig: Apply 1 application topically 2 (two) times daily.    Dispense:  30 g    Refill:  0    Please discontinue previous 0.1 % cream    Laroy Apple, MD [facility] 05/05/2017, 5:32 PM

## 2017-05-12 ENCOUNTER — Telehealth: Payer: Self-pay | Admitting: Family Medicine

## 2017-05-12 ENCOUNTER — Ambulatory Visit (INDEPENDENT_AMBULATORY_CARE_PROVIDER_SITE_OTHER): Payer: Medicare HMO | Admitting: Family Medicine

## 2017-05-12 ENCOUNTER — Encounter: Payer: Self-pay | Admitting: Family Medicine

## 2017-05-12 VITALS — BP 125/74 | HR 90 | Temp 97.1°F | Ht 74.0 in | Wt 211.2 lb

## 2017-05-12 DIAGNOSIS — R21 Rash and other nonspecific skin eruption: Secondary | ICD-10-CM | POA: Diagnosis not present

## 2017-05-12 MED ORDER — METHYLPREDNISOLONE ACETATE 80 MG/ML IJ SUSP
80.0000 mg | Freq: Once | INTRAMUSCULAR | Status: AC
  Administered 2017-05-12: 80 mg via INTRAMUSCULAR

## 2017-05-12 MED ORDER — CEPHALEXIN 500 MG PO CAPS: 500.0000 mg | ORAL_CAPSULE | Freq: Three times a day (TID) | ORAL | 0 refills | Status: DC

## 2017-05-12 NOTE — Patient Instructions (Signed)
Great to see you!  Come back in 2 weeks for an INR  Eczema Eczema, also called atopic dermatitis, is a skin disorder that causes inflammation of the skin. It causes a red rash and dry, scaly skin. The skin becomes very itchy. Eczema is generally worse during the cooler winter months and often improves with the warmth of summer. Eczema usually starts showing signs in infancy. Some children outgrow eczema, but it may last through adulthood. What are the causes? The exact cause of eczema is not known, but it appears to run in families. People with eczema often have a family history of eczema, allergies, asthma, or hay fever. Eczema is not contagious. Flare-ups of the condition may be caused by:  Contact with something you are sensitive or allergic to.  Stress.  What are the signs or symptoms?  Dry, scaly skin.  Red, itchy rash.  Itchiness. This may occur before the skin rash and may be very intense. How is this diagnosed? The diagnosis of eczema is usually made based on symptoms and medical history. How is this treated? Eczema cannot be cured, but symptoms usually can be controlled with treatment and other strategies. A treatment plan might include:  Controlling the itching and scratching. ? Use over-the-counter antihistamines as directed for itching. This is especially useful at night when the itching tends to be worse. ? Use over-the-counter steroid creams as directed for itching. ? Avoid scratching. Scratching makes the rash and itching worse. It may also result in a skin infection (impetigo) due to a break in the skin caused by scratching.  Keeping the skin well moisturized with creams every day. This will seal in moisture and help prevent dryness. Lotions that contain alcohol and water should be avoided because they can dry the skin.  Limiting exposure to things that you are sensitive or allergic to (allergens).  Recognizing situations that cause stress.  Developing a plan to  manage stress.  Follow these instructions at home:  Only take over-the-counter or prescription medicines as directed by your health care provider.  Do not use anything on the skin without checking with your health care provider.  Keep baths or showers short (5 minutes) in warm (not hot) water. Use mild cleansers for bathing. These should be unscented. You may add nonperfumed bath oil to the bath water. It is best to avoid soap and bubble bath.  Immediately after a bath or shower, when the skin is still damp, apply a moisturizing ointment to the entire body. This ointment should be a petroleum ointment. This will seal in moisture and help prevent dryness. The thicker the ointment, the better. These should be unscented.  Keep fingernails cut short. Children with eczema may need to wear soft gloves or mittens at night after applying an ointment.  Dress in clothes made of cotton or cotton blends. Dress lightly, because heat increases itching.  A child with eczema should stay away from anyone with fever blisters or cold sores. The virus that causes fever blisters (herpes simplex) can cause a serious skin infection in children with eczema. Contact a health care provider if:  Your itching interferes with sleep.  Your rash gets worse or is not better within 1 week after starting treatment.  You see pus or soft yellow scabs in the rash area.  You have a fever.  You have a rash flare-up after contact with someone who has fever blisters. This information is not intended to replace advice given to you by your health care  provider. Make sure you discuss any questions you have with your health care provider. Document Released: 08/20/2000 Document Revised: 01/29/2016 Document Reviewed: 03/26/2013 Elsevier Interactive Patient Education  2017 [employer].

## 2017-05-12 NOTE — Telephone Encounter (Signed)
Lets have him follow up  Laroy Apple, MD [facility] 05/12/2017, 2:51 PM  \

## 2017-05-12 NOTE — Progress Notes (Signed)
   HPI  Patient presents today here with persistent rash.  Patient was seen a few days ago treated with Kenalog ointment for presumed atopic dermatitis. He states that the rash behind his knee and his right shoulder are getting worse.  He denies fever, chills, sweats.  PMH: Smoking status noted ROS: Per HPI  Objective: BP 125/74   Pulse 90   Temp (!) 97.1 F (36.2 C) (Oral)   Ht 6\' 2"  (1.88 m)   Wt 211 lb 3.2 oz (95.8 kg)   BMI 27.12 kg/m  Gen: NAD, alert, cooperative with exam HEENT: NCAT, EOMI, PERRL CV: RRR, good S1/S2 Resp: CTABL, no wheezes, non-labored Ext: No edema, warm Skin:  Left popliteal fossa with erythematous papular rash with excoriation, no induration,  fluctuance, warmth, or drainage Right shoulder with papular rash and heavy excoriation  Assessment and plan:  # Rash Likely atopic dermatitis Topical steroids were not effective Given IM Depo-Medrol in clinic Also cephalexin to cover for possible superinfection with tenderness of the left popliteal fossa   Patient will return in about 2 weeks for INR, I was trying to avoid systemic steroids with diabetes and Coumadin anticoagulation  Meds ordered this encounter  Medications  . cephALEXin (KEFLEX) 500 MG capsule    Sig: Take 1 capsule (500 mg total) by mouth 3 (three) times daily.    Dispense:  21 capsule    Refill:  0  . methylPREDNISolone acetate (DEPO-MEDROL) injection 80 mg    Laroy Apple, MD [facility] 05/12/2017, 3:50 PM

## 2017-05-12 NOTE — Telephone Encounter (Signed)
Patient is being seen today

## 2017-05-12 NOTE — Telephone Encounter (Signed)
The cream he was given for his rash. Its not helping and the rash is spreading all over. It itching and red.

## 2017-05-13 ENCOUNTER — Encounter: Payer: Medicare HMO | Admitting: Pharmacist Clinician (PhC)/ Clinical Pharmacy Specialist

## 2017-05-13 ENCOUNTER — Encounter: Payer: Self-pay | Admitting: Pharmacist Clinician (PhC)/ Clinical Pharmacy Specialist

## 2017-05-17 ENCOUNTER — Encounter (HOSPITAL_COMMUNITY): Payer: Self-pay | Admitting: *Deleted

## 2017-05-17 ENCOUNTER — Inpatient Hospital Stay (HOSPITAL_COMMUNITY)
Admission: EM | Admit: 2017-05-17 | Discharge: 2017-05-20 | DRG: 871 | Disposition: A | Discharge status: Home / Self Care | Payer: Medicare HMO | Attending: Internal Medicine | Admitting: Internal Medicine

## 2017-05-17 ENCOUNTER — Emergency Department (HOSPITAL_COMMUNITY): Payer: Medicare HMO

## 2017-05-17 ENCOUNTER — Other Ambulatory Visit: Payer: Self-pay

## 2017-05-17 DIAGNOSIS — I10 Essential (primary) hypertension: Secondary | ICD-10-CM | POA: Diagnosis present

## 2017-05-17 DIAGNOSIS — Z820 Family history of epilepsy and other diseases of the nervous system: Secondary | ICD-10-CM | POA: Diagnosis not present

## 2017-05-17 DIAGNOSIS — E1140 Type 2 diabetes mellitus with diabetic neuropathy, unspecified: Secondary | ICD-10-CM | POA: Diagnosis not present

## 2017-05-17 DIAGNOSIS — A419 Sepsis, unspecified organism: Secondary | ICD-10-CM | POA: Diagnosis not present

## 2017-05-17 DIAGNOSIS — E876 Hypokalemia: Secondary | ICD-10-CM | POA: Diagnosis present

## 2017-05-17 DIAGNOSIS — R600 Localized edema: Secondary | ICD-10-CM | POA: Diagnosis not present

## 2017-05-17 DIAGNOSIS — E785 Hyperlipidemia, unspecified: Secondary | ICD-10-CM | POA: Diagnosis not present

## 2017-05-17 DIAGNOSIS — I82401 Acute embolism and thrombosis of unspecified deep veins of right lower extremity: Secondary | ICD-10-CM | POA: Diagnosis present

## 2017-05-17 DIAGNOSIS — G9341 Metabolic encephalopathy: Secondary | ICD-10-CM | POA: Diagnosis not present

## 2017-05-17 DIAGNOSIS — L03115 Cellulitis of right lower limb: Secondary | ICD-10-CM

## 2017-05-17 DIAGNOSIS — I82411 Acute embolism and thrombosis of right femoral vein: Secondary | ICD-10-CM | POA: Diagnosis not present

## 2017-05-17 DIAGNOSIS — I82431 Acute embolism and thrombosis of right popliteal vein: Secondary | ICD-10-CM | POA: Diagnosis present

## 2017-05-17 DIAGNOSIS — Z7901 Long term (current) use of anticoagulants: Secondary | ICD-10-CM | POA: Diagnosis not present

## 2017-05-17 DIAGNOSIS — Z8673 Personal history of transient ischemic attack (TIA), and cerebral infarction without residual deficits: Secondary | ICD-10-CM

## 2017-05-17 DIAGNOSIS — R404 Transient alteration of awareness: Secondary | ICD-10-CM | POA: Diagnosis not present

## 2017-05-17 DIAGNOSIS — Z833 Family history of diabetes mellitus: Secondary | ICD-10-CM | POA: Diagnosis not present

## 2017-05-17 DIAGNOSIS — R531 Weakness: Secondary | ICD-10-CM | POA: Diagnosis not present

## 2017-05-17 DIAGNOSIS — Z9079 Acquired absence of other genital organ(s): Secondary | ICD-10-CM | POA: Diagnosis not present

## 2017-05-17 DIAGNOSIS — Z8042 Family history of malignant neoplasm of prostate: Secondary | ICD-10-CM

## 2017-05-17 DIAGNOSIS — I480 Paroxysmal atrial fibrillation: Secondary | ICD-10-CM | POA: Diagnosis present

## 2017-05-17 DIAGNOSIS — Z86718 Personal history of other venous thrombosis and embolism: Secondary | ICD-10-CM | POA: Diagnosis not present

## 2017-05-17 DIAGNOSIS — L0390 Cellulitis, unspecified: Secondary | ICD-10-CM | POA: Diagnosis present

## 2017-05-17 DIAGNOSIS — Z823 Family history of stroke: Secondary | ICD-10-CM | POA: Diagnosis not present

## 2017-05-17 LAB — URINALYSIS, ROUTINE W REFLEX MICROSCOPIC
Bilirubin Urine: NEGATIVE
Glucose, UA: NEGATIVE mg/dL
HGB URINE DIPSTICK: NEGATIVE
Ketones, ur: NEGATIVE mg/dL
LEUKOCYTES UA: NEGATIVE
Nitrite: NEGATIVE
Protein, ur: NEGATIVE mg/dL
SPECIFIC GRAVITY, URINE: 1.011 (ref 1.005–1.030)
pH: 8 (ref 5.0–8.0)

## 2017-05-17 LAB — COMPREHENSIVE METABOLIC PANEL
ALBUMIN: 3.4 g/dL — AB (ref 3.5–5.0)
ALK PHOS: 42 U/L (ref 38–126)
ALT: 24 U/L (ref 17–63)
ANION GAP: 9 (ref 5–15)
AST: 21 U/L (ref 15–41)
BILIRUBIN TOTAL: 0.6 mg/dL (ref 0.3–1.2)
BUN: 13 mg/dL (ref 6–20)
CALCIUM: 9.1 mg/dL (ref 8.9–10.3)
CO2: 26 mmol/L (ref 22–32)
CREATININE: 0.91 mg/dL (ref 0.61–1.24)
Chloride: 104 mmol/L (ref 101–111)
GFR calc Af Amer: >60 mL/min (ref >60)
GFR calc non Af Amer: >60 mL/min (ref >60)
GLUCOSE: 153 mg/dL — AB (ref 65–99)
Potassium: 3.1 mmol/L — ABNORMAL LOW (ref 3.5–5.1)
SODIUM: 139 mmol/L (ref 135–145)
TOTAL PROTEIN: 6.2 g/dL — AB (ref 6.5–8.1)

## 2017-05-17 LAB — CBC WITH DIFFERENTIAL/PLATELET
BASOS ABS: 0 10*3/uL (ref 0.0–0.1)
BASOS PCT: 0 %
EOS ABS: 0 10*3/uL (ref 0.0–0.7)
EOS PCT: 0 %
HCT: 37.2 % — ABNORMAL LOW (ref 39.0–52.0)
Hemoglobin: 12 g/dL — ABNORMAL LOW (ref 13.0–17.0)
Lymphocytes Relative: 5 %
Lymphs Abs: 0.8 10*3/uL (ref 0.7–4.0)
MCH: 29 pg (ref 26.0–34.0)
MCHC: 32.3 g/dL (ref 30.0–36.0)
MCV: 89.9 fL (ref 78.0–100.0)
MONO ABS: 0.8 10*3/uL (ref 0.1–1.0)
MONOS PCT: 4 %
NEUTROS ABS: 16.6 10*3/uL — AB (ref 1.7–7.7)
Neutrophils Relative %: 91 %
PLATELETS: 226 10*3/uL (ref 150–400)
RBC: 4.14 MIL/uL — ABNORMAL LOW (ref 4.22–5.81)
RDW: 16.1 % — ABNORMAL HIGH (ref 11.5–15.5)
WBC: 18.3 10*3/uL — ABNORMAL HIGH (ref 4.0–10.5)

## 2017-05-17 LAB — I-STAT CG4 LACTIC ACID, ED
Lactic Acid, Venous: 1.61 mmol/L (ref 0.5–1.9)
Lactic Acid, Venous: 2.06 mmol/L (ref 0.5–1.9)

## 2017-05-17 LAB — LACTIC ACID, PLASMA
Lactic Acid, Venous: 2.5 mmol/L (ref 0.5–1.9)
Lactic Acid, Venous: 1.1 mmol/L (ref 0.5–1.9)

## 2017-05-17 LAB — PROCALCITONIN: PROCALCITONIN: 0.54 ng/mL

## 2017-05-17 LAB — APTT: aPTT: 35 seconds (ref 24–36)

## 2017-05-17 LAB — PROTIME-INR
INR: 1.54
PROTHROMBIN TIME: 18.4 s — AB (ref 11.4–15.2)

## 2017-05-17 MED ORDER — VANCOMYCIN HCL 10 G IV SOLR
2000.0000 mg | Freq: Once | INTRAVENOUS | Status: AC
  Administered 2017-05-17: 2000 mg via INTRAVENOUS
  Filled 2017-05-17: qty 2000

## 2017-05-17 MED ORDER — PIPERACILLIN-TAZOBACTAM 3.375 G IVPB
3.3750 g | Freq: Three times a day (TID) | INTRAVENOUS | Status: DC
  Administered 2017-05-17 – 2017-05-20 (×7): 3.375 g via INTRAVENOUS
  Filled 2017-05-17 (×8): qty 50

## 2017-05-17 MED ORDER — HEPARIN BOLUS VIA INFUSION
2500.0000 [IU] | Freq: Once | INTRAVENOUS | Status: AC
  Administered 2017-05-17: 2500 [IU] via INTRAVENOUS
  Filled 2017-05-17: qty 2500

## 2017-05-17 MED ORDER — PIPERACILLIN-TAZOBACTAM 3.375 G IVPB 30 MIN: 3.3750 g | Freq: Once | INTRAVENOUS | Status: DC

## 2017-05-17 MED ORDER — ATORVASTATIN CALCIUM 40 MG PO TABS
40.0000 mg | ORAL_TABLET | Freq: Every day | ORAL | Status: DC
  Administered 2017-05-17 – 2017-05-19 (×3): 40 mg via ORAL
  Filled 2017-05-17 (×3): qty 1

## 2017-05-17 MED ORDER — VANCOMYCIN HCL IN DEXTROSE 1-5 GM/200ML-% IV SOLN: 1000.0000 mg | Freq: Once | INTRAVENOUS | Status: DC

## 2017-05-17 MED ORDER — SODIUM CHLORIDE 0.9 % IV BOLUS (SEPSIS)
1000.0000 mL | Freq: Once | INTRAVENOUS | Status: AC
  Administered 2017-05-17: 1000 mL via INTRAVENOUS

## 2017-05-17 MED ORDER — AMLODIPINE BESYLATE 5 MG PO TABS
5.0000 mg | ORAL_TABLET | Freq: Every day | ORAL | Status: DC
  Administered 2017-05-17 – 2017-05-20 (×4): 5 mg via ORAL
  Filled 2017-05-17 (×4): qty 1

## 2017-05-17 MED ORDER — POTASSIUM CHLORIDE IN NACL 40-0.9 MEQ/L-% IV SOLN
INTRAVENOUS | Status: DC
  Administered 2017-05-17 – 2017-05-18 (×2): 100 mL/h via INTRAVENOUS

## 2017-05-17 MED ORDER — ACETAMINOPHEN 325 MG PO TABS
650.0000 mg | ORAL_TABLET | Freq: Once | ORAL | Status: AC | PRN
  Administered 2017-05-17: 650 mg via ORAL
  Filled 2017-05-17: qty 2

## 2017-05-17 MED ORDER — BENAZEPRIL HCL 10 MG PO TABS
40.0000 mg | ORAL_TABLET | Freq: Two times a day (BID) | ORAL | Status: DC
  Administered 2017-05-17 – 2017-05-20 (×6): 40 mg via ORAL
  Filled 2017-05-17 (×6): qty 4

## 2017-05-17 MED ORDER — INSULIN ASPART 100 UNIT/ML ~~LOC~~ SOLN
0.0000 [IU] | Freq: Three times a day (TID) | SUBCUTANEOUS | Status: DC
  Administered 2017-05-18: 5 [IU] via SUBCUTANEOUS
  Administered 2017-05-18 – 2017-05-19 (×2): 3 [IU] via SUBCUTANEOUS
  Administered 2017-05-19 (×2): 2 [IU] via SUBCUTANEOUS
  Administered 2017-05-20: 3 [IU] via SUBCUTANEOUS
  Administered 2017-05-20: 2 [IU] via SUBCUTANEOUS

## 2017-05-17 MED ORDER — ONDANSETRON HCL 4 MG PO TABS: 4.0000 mg | ORAL_TABLET | Freq: Four times a day (QID) | ORAL | Status: DC | PRN

## 2017-05-17 MED ORDER — FENOFIBRATE 160 MG PO TABS
160.0000 mg | ORAL_TABLET | Freq: Every day | ORAL | Status: DC
  Administered 2017-05-17 – 2017-05-20 (×4): 160 mg via ORAL
  Filled 2017-05-17 (×4): qty 1

## 2017-05-17 MED ORDER — HEPARIN (PORCINE) IN NACL 100-0.45 UNIT/ML-% IJ SOLN
2000.0000 [IU]/h | INTRAMUSCULAR | Status: DC
  Administered 2017-05-17: 1450 [IU]/h via INTRAVENOUS
  Administered 2017-05-18: 2000 [IU]/h via INTRAVENOUS
  Administered 2017-05-18: 1450 [IU]/h via INTRAVENOUS
  Filled 2017-05-17 (×3): qty 250

## 2017-05-17 MED ORDER — VANCOMYCIN HCL IN DEXTROSE 1-5 GM/200ML-% IV SOLN
1000.0000 mg | Freq: Two times a day (BID) | INTRAVENOUS | Status: DC
  Administered 2017-05-18 – 2017-05-20 (×5): 1000 mg via INTRAVENOUS
  Filled 2017-05-17 (×5): qty 200

## 2017-05-17 MED ORDER — ACETAMINOPHEN 325 MG PO TABS
650.0000 mg | ORAL_TABLET | Freq: Four times a day (QID) | ORAL | Status: DC | PRN
  Administered 2017-05-17: 650 mg via ORAL
  Filled 2017-05-17: qty 2

## 2017-05-17 MED ORDER — GABAPENTIN 400 MG PO CAPS
800.0000 mg | ORAL_CAPSULE | Freq: Two times a day (BID) | ORAL | Status: DC
  Administered 2017-05-17 – 2017-05-20 (×6): 800 mg via ORAL
  Filled 2017-05-17 (×6): qty 2

## 2017-05-17 MED ORDER — ONDANSETRON HCL 4 MG/2ML IJ SOLN: 4.0000 mg | Freq: Four times a day (QID) | INTRAMUSCULAR | Status: DC | PRN

## 2017-05-17 MED ORDER — POTASSIUM CHLORIDE CRYS ER 20 MEQ PO TBCR
40.0000 meq | EXTENDED_RELEASE_TABLET | Freq: Two times a day (BID) | ORAL | Status: DC
  Administered 2017-05-17 – 2017-05-18 (×2): 40 meq via ORAL
  Filled 2017-05-17 (×2): qty 2

## 2017-05-17 MED ORDER — PIPERACILLIN-TAZOBACTAM 3.375 G IVPB 30 MIN
3.3750 g | Freq: Once | INTRAVENOUS | Status: AC
  Administered 2017-05-17: 3.375 g via INTRAVENOUS
  Filled 2017-05-17: qty 50

## 2017-05-17 MED ORDER — ACETAMINOPHEN 650 MG RE SUPP: 650.0000 mg | Freq: Four times a day (QID) | RECTAL | Status: DC | PRN
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
## 2017-05-17 NOTE — ED Triage Notes (Signed)
Pt brought in by [facility] with c/o entire body rash and itching x couple of weeks. Pt was seen by PCP recently and was given cream with no relief. Family reports generalized weakness and urine incontinence since yesterday. CBG 193 for EMS. EMS reports large amount of bed bugs on pt.

## 2017-05-17 NOTE — Progress Notes (Signed)
Pharmacy Antibiotic Note  Keith Doyle is a 81 y.o. male admitted on 05/17/2017 with cellulitis.  Pharmacy has been consulted for Vancomycin and zosyn dosing.  Plan: Vancomycin 2000mg  loading dose, then 1000mg   IV every 12 hours.  Goal trough 10-15 mcg/mL. Zosyn 3.375g IV q8h (4 hour infusion).  F/U cxs and clinical progress Monitor V/S, labs, and levels as indicated  Height: 6\' 2"  (188 cm) Weight: 211 lb (95.7 kg) IBW/kg (Calculated) : 82.2  Temp (24hrs), Avg:101.1 F (38.4 C), Min:101.1 F (38.4 C), Max:101.1 F (38.4 C)   Recent Labs Lab 05/17/17 1202 05/17/17 1215 05/17/17 1543  WBC 18.3*  --   --   CREATININE 0.91  --   --   LATICACIDVEN  --  1.61 2.06*    Estimated Creatinine Clearance: 76.5 mL/min (by C-G formula based on SCr of 0.91 mg/dL).    No Known Allergies  Antimicrobials this admission: Vancomycin 9/11 >> Zosyn >>   Dose adjustments this admission: N/A  Microbiology results: 9/11 BCx: pending 9/11 UCx: pending  Thank you for allowing pharmacy to be a part of this patient's care.  Isac Sarna, BS [place], [place] Clinical Pharmacist Pager [phone] 05/17/2017 4:22 PM
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
## 2017-05-17 NOTE — ED Notes (Signed)
ED Provider at bedside. 

## 2017-05-17 NOTE — ED Notes (Signed)
CRITICAL VALUE ALERT  Critical Value:  Istat 2.06  Date & Time Notied:  05/17/17 1550  Provider Notified: Dr. Roderic Palau  Orders Received/Actions taken: MD made aware, no further orders at this time

## 2017-05-18 DIAGNOSIS — E1140 Type 2 diabetes mellitus with diabetic neuropathy, unspecified: Secondary | ICD-10-CM

## 2017-05-18 DIAGNOSIS — L03115 Cellulitis of right lower limb: Secondary | ICD-10-CM

## 2017-05-18 LAB — GLUCOSE, CAPILLARY
GLUCOSE-CAPILLARY: 159 mg/dL — AB (ref 65–99)
GLUCOSE-CAPILLARY: 163 mg/dL — AB (ref 65–99)
Glucose-Capillary: 117 mg/dL — ABNORMAL HIGH (ref 65–99)
Glucose-Capillary: 204 mg/dL — ABNORMAL HIGH (ref 65–99)

## 2017-05-18 LAB — BASIC METABOLIC PANEL WITH GFR
Anion gap: 5 (ref 5–15)
BUN: 11 mg/dL (ref 6–20)
CO2: 23 mmol/L (ref 22–32)
Calcium: 7.9 mg/dL — ABNORMAL LOW (ref 8.9–10.3)
Chloride: 113 mmol/L — ABNORMAL HIGH (ref 101–111)
Creatinine, Ser: 0.93 mg/dL (ref 0.61–1.24)
GFR calc Af Amer: >60 mL/min
GFR calc non Af Amer: >60 mL/min
Glucose, Bld: 169 mg/dL — ABNORMAL HIGH (ref 65–99)
Potassium: 3.5 mmol/L (ref 3.5–5.1)
Sodium: 141 mmol/L (ref 135–145)

## 2017-05-18 LAB — HEPARIN LEVEL (UNFRACTIONATED)
HEPARIN UNFRACTIONATED: 0.53 [IU]/mL (ref 0.30–0.70)
Heparin Unfractionated: 0.22 [IU]/mL — ABNORMAL LOW (ref 0.30–0.70)
Heparin Unfractionated: 0.22 [IU]/mL — ABNORMAL LOW (ref 0.30–0.70)
Heparin Unfractionated: 0.18 [IU]/mL — ABNORMAL LOW (ref 0.30–0.70)

## 2017-05-18 LAB — CBC
HCT: 33.6 % — ABNORMAL LOW (ref 39.0–52.0)
Hemoglobin: 10.7 g/dL — ABNORMAL LOW (ref 13.0–17.0)
MCH: 29 pg (ref 26.0–34.0)
MCHC: 31.8 g/dL (ref 30.0–36.0)
MCV: 91.1 fL (ref 78.0–100.0)
Platelets: 169 K/uL (ref 150–400)
RBC: 3.69 MIL/uL — ABNORMAL LOW (ref 4.22–5.81)
RDW: 16.2 % — ABNORMAL HIGH (ref 11.5–15.5)
WBC: 10.8 K/uL — ABNORMAL HIGH (ref 4.0–10.5)

## 2017-05-18 MED ORDER — POTASSIUM CHLORIDE CRYS ER 20 MEQ PO TBCR
20.0000 meq | EXTENDED_RELEASE_TABLET | Freq: Every day | ORAL | Status: DC
  Administered 2017-05-19 – 2017-05-20 (×2): 20 meq via ORAL
  Filled 2017-05-18 (×2): qty 1

## 2017-05-18 MED ORDER — HEPARIN BOLUS VIA INFUSION
2500.0000 [IU] | Freq: Once | INTRAVENOUS | Status: AC
  Administered 2017-05-18: 2500 [IU] via INTRAVENOUS
  Filled 2017-05-18: qty 2500

## 2017-05-18 MED ORDER — POTASSIUM CHLORIDE IN NACL 40-0.9 MEQ/L-% IV SOLN: INTRAVENOUS | Status: AC

## 2017-05-18 NOTE — Plan of Care (Signed)
Problem: Safety: Goal: Ability to remain free from injury will improve Outcome: Progressing Pt on High fall risk d/t medications and unsteady gait. Pt has not had any falls in the past 6 months but does need assistance to BR. Bed in lowest locked position, SR up x3, Call bell within reach. Pt educated on safety and to call when he needs to get up. Pt verbalized understanding. Will continue to monitor pt
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
## 2017-05-18 NOTE — Progress Notes (Signed)
Patient ID: Keith Doyle, male   DOB: [birth year], 81 y.o.   MRN: [id number]    PROGRESS NOTE    Keith Doyle  UDJ:[id number] DOB: 03-Sep-1936 DOA: 05/17/2017  PCP: Timmothy Euler, MD   Brief Narrative:  Patient is 81 year old male with known atrial fibrillation and previous DVT, anticoagulated with Coumadin, hypertension, hyperlipidemia, presented with main concern of sudden onset of worsening pain and swelling in the right lower extremity. Family also reported noticing worsening confusion and difficulty moving his right lower extremity. Upon arrival to emergency department, patient had temperature of 101F and was tachycardic, noted to be lethargic. Patient was started on IV antibiotics and TRH asked to admit for further evaluation.  Assessment & Plan:   Active Problems:   Sepsis secondary to right lower extremity cellulitis - Please note that patient met criteria for sepsis on admission - Patient has been started on vancomycin and Zosyn  - We'll continue same antibiotic regimen today, day #2 - Continue to provide supportive care with analgesia as needed and keep extremity elevated - Monitor clinical response to antibiotics and narrow down in next 24 hours if continued clinical improvement noted - It is also noted that white blood cell is trending down and thus, an indicator of response to current antibiotic regimen - We will repeat CBC in the morning    Right lower extremity DVT - Vascular Doppler positive for nonocclusive thrombus in the mid to lower right femoral vein and popliteal vein - Patient has been started on heparin drip - I have discussed anticoagulation with patient and his daughter at bedside - Will ask pharmacy for assistance as well - Suspect patient can be candidate for Xarelto    Acute metabolic encephalopathy - Secondary to the above - Mental status appears to be stable and at baseline    Paroxysmal atrial fibrillation - Keep on heparin for now - Possibly  changed to Xarelto in next 24 hours - Rate has been controlled    Essential hypertension - Reasonable inpatient control - Continue Norvasc and denies peripheral    Hyperlipidemia - Continue statin    Hypokalemia - Supplemented and within normal limits    Diabetes mellitus with neuropathy ([place]) - Keep on sliding scale insulin until oral intake improves - Continue gabapentin for neuropathy  DVT prophylaxis: Heparin infusion Code Status: Full Family Communication: Patient and daughter at bedside  Disposition Plan: Will likely go home once clinically improved, suspect one to 2 days  Consultants:   None  Procedures:   Lower extremity vascular Doppler 05/17/2017 -->   Antimicrobials:   Vancomycin 05/17/2017 -->  Zosyn 05/17/2017-->  Subjective: Patient reports feeling better but still with pressure in the right lower extremity.  Objective: Vitals:   05/17/17 1627 05/17/17 2031 05/17/17 2120 05/18/17 0609  BP: (!) 161/74 (!) 158/74 123/67 132/64  Pulse: 84 88 89 82  Resp: '20 20 16 16  ' Temp: (!) 101.6 F (38.7 C) 100.2 F (37.9 C) 98.4 F (36.9 C) 99.4 F (37.4 C)  TempSrc: Oral Oral Oral Oral  SpO2: 95% 95% 96% 95%  Weight: 92.4 kg (203 lb 11.3 oz)     Height: '6\' 2"'  (1.88 m)       Intake/Output Summary (Last 24 hours) at 05/18/17 1452 Last data filed at 05/18/17 1007  Gross per 24 hour  Intake          3185.91 ml  Output             3200 ml  Net           -14.09 ml   Filed Weights   05/17/17 1033 05/17/17 1627  Weight: 95.7 kg (211 lb) 92.4 kg (203 lb 11.3 oz)    Examination:  General exam: Appears calm and comfortable  Respiratory system: Clear to auscultation. Respiratory effort normal. Cardiovascular system: S1 & S2 heard, RRR. No rubs, gallops or clicks. +1 bilateral lower extremity pitting edema, more erythema in right lower extremity with warm to touch and tenderness to palpation Gastrointestinal system: Abdomen is nondistended, soft and  nontender. No organomegaly or masses felt. Normal bowel sounds heard. Central nervous system: Alert and oriented. No focal neurological deficits.   Data Reviewed: I have personally reviewed following labs and imaging studies  CBC:  Recent Labs Lab 05/17/17 1202 05/18/17 0222  WBC 18.3* 10.8*  NEUTROABS 16.6*  --   HGB 12.0* 10.7*  HCT 37.2* 33.6*  MCV 89.9 91.1  PLT 226 546   Basic Metabolic Panel:  Recent Labs Lab 05/17/17 1202 05/18/17 0222  NA 139 141  K 3.1* 3.5  CL 104 113*  CO2 26 23  GLUCOSE 153* 169*  BUN 13 11  CREATININE 0.91 0.93  CALCIUM 9.1 7.9*   Liver Function Tests:  Recent Labs Lab 05/17/17 1202  AST 21  ALT 24  ALKPHOS 42  BILITOT 0.6  PROT 6.2*  ALBUMIN 3.4*   Coagulation Profile:  Recent Labs Lab 05/17/17 1212  INR 1.54   CBG:  Recent Labs Lab 05/18/17 0744 05/18/17 1107  GLUCAP 117* 204*   Urine analysis:    Component Value Date/Time   COLORURINE YELLOW 05/17/2017 1153   APPEARANCEUR CLEAR 05/17/2017 1153   APPEARANCEUR Clear 07/16/2016 1754   LABSPEC 1.011 05/17/2017 1153   PHURINE 8.0 05/17/2017 1153   GLUCOSEU NEGATIVE 05/17/2017 1153   HGBUR NEGATIVE 05/17/2017 1153   BILIRUBINUR NEGATIVE 05/17/2017 1153   BILIRUBINUR Negative 07/16/2016 1754   KETONESUR NEGATIVE 05/17/2017 1153   PROTEINUR NEGATIVE 05/17/2017 1153   UROBILINOGEN 0.2 08/30/2011 0540   NITRITE NEGATIVE 05/17/2017 1153   LEUKOCYTESUR NEGATIVE 05/17/2017 1153   LEUKOCYTESUR Negative 07/16/2016 1754   Recent Results (from the past 240 hour(s))  Blood Culture (routine x 2)     Status: None (Preliminary result)   Collection Time: 05/17/17 12:08 PM  Result Value Ref Range Status   Specimen Description RIGHT ANTECUBITAL  Final   Special Requests   Final    BOTTLES DRAWN AEROBIC AND ANAEROBIC Blood Culture adequate volume   Culture NO GROWTH < 24 HOURS  Final   Report Status PENDING  Incomplete  Blood Culture (routine x 2)     Status: None  (Preliminary result)   Collection Time: 05/17/17 12:09 PM  Result Value Ref Range Status   Specimen Description BLOOD LEFT WRIST  Final   Special Requests   Final    BOTTLES DRAWN AEROBIC ONLY Blood Culture adequate volume   Culture NO GROWTH < 24 HOURS  Final   Report Status PENDING  Incomplete    Radiology Studies: US Venous Img Lower Unilateral Right Result Date: 05/17/2017 Positive study for DVT right lower extremity. Nonocclusive thrombus is in the mid to lower right femoral vein and popliteal vein .   Dg Chest Port 1 View Result Date: 05/17/2017 Stable moderate hiatal hernia with associated medial bibasilar compressive atelectasis. Otherwise no active cardiopulmonary disease.   Scheduled Meds: . amLODipine  5 mg Oral Daily  . atorvastatin  40 mg Oral q1800  .  benazepril  40 mg Oral BID  . fenofibrate  160 mg Oral Daily  . gabapentin  800 mg Oral BID  . insulin aspart  0-15 Units Subcutaneous TID WC  . potassium chloride SA  40 mEq Oral BID   Continuous Infusions: . 0.9 % NaCl with KCl 40 mEq / L 100 mL/hr (05/18/17 0511)  . heparin 1,700 Units/hr (05/18/17 1051)  . piperacillin-tazobactam (ZOSYN)  IV Stopped (05/18/17 0913)  . vancomycin 1,000 mg (05/18/17 1202)     LOS: 1 day   Time spent: 35 minutes   Faye Ramsay, MD [facility] Pager [phone]  If 7PM-7AM, please contact night-coverage [email or web address] Password [email or web address] 05/18/2017, 2:52 PM

## 2017-05-18 NOTE — Progress Notes (Signed)
Heparin level .22 Pharmacist Hayes increase to 1600units and check level in 6 hours.  Orders placed see chart.

## 2017-05-19 LAB — GLUCOSE, CAPILLARY
GLUCOSE-CAPILLARY: 149 mg/dL — AB (ref 65–99)
GLUCOSE-CAPILLARY: 172 mg/dL — AB (ref 65–99)
GLUCOSE-CAPILLARY: 133 mg/dL — AB (ref 65–99)
Glucose-Capillary: 183 mg/dL — ABNORMAL HIGH (ref 65–99)

## 2017-05-19 LAB — BASIC METABOLIC PANEL
ANION GAP: 6 (ref 5–15)
BUN: 9 mg/dL (ref 6–20)
CALCIUM: 8.7 mg/dL — AB (ref 8.9–10.3)
CO2: 24 mmol/L (ref 22–32)
Chloride: 114 mmol/L — ABNORMAL HIGH (ref 101–111)
Creatinine, Ser: 0.85 mg/dL (ref 0.61–1.24)
Glucose, Bld: 159 mg/dL — ABNORMAL HIGH (ref 65–99)
POTASSIUM: 3.7 mmol/L (ref 3.5–5.1)
Sodium: 144 mmol/L (ref 135–145)

## 2017-05-19 LAB — CBC
HCT: 33.4 % — ABNORMAL LOW (ref 39.0–52.0)
HEMOGLOBIN: 10.7 g/dL — AB (ref 13.0–17.0)
MCH: 29.3 pg (ref 26.0–34.0)
MCHC: 32 g/dL (ref 30.0–36.0)
MCV: 91.5 fL (ref 78.0–100.0)
PLATELETS: 190 10*3/uL (ref 150–400)
RBC: 3.65 MIL/uL — AB (ref 4.22–5.81)
RDW: 16 % — ABNORMAL HIGH (ref 11.5–15.5)
WBC: 6.9 10*3/uL (ref 4.0–10.5)

## 2017-05-19 LAB — HEPARIN LEVEL (UNFRACTIONATED): HEPARIN UNFRACTIONATED: 0.34 [IU]/mL (ref 0.30–0.70)

## 2017-05-19 LAB — MAGNESIUM: Magnesium: 1.9 mg/dL (ref 1.7–2.4)

## 2017-05-19 MED ORDER — RIVAROXABAN 20 MG PO TABS: 20.0000 mg | ORAL_TABLET | Freq: Every day | ORAL | Status: DC

## 2017-05-19 MED ORDER — RIVAROXABAN 15 MG PO TABS
15.0000 mg | ORAL_TABLET | Freq: Two times a day (BID) | ORAL | Status: DC
  Administered 2017-05-19 – 2017-05-20 (×3): 15 mg via ORAL
  Filled 2017-05-19 (×3): qty 1

## 2017-05-19 NOTE — Care Management Note (Addendum)
Case Management Note  Patient Details  Name: Keith Doyle MRN: [id number] Date of Birth: [birth year]  Subjective/Objective:   Adm with cellulitis, afib and DVT. From home alone. Started on Xarelto, coupon given to patient by pharmacy.             Action/Plan: PT eval pending.  ADDENDUM: PT recommends SNF. Patient declines. He also declines home health as he has had it in the past. He reports he does the exercises that he learned from [facility] PT daily. He also reports that he goes dancing every Thursday in [place] and still drives. He reports his daughter comes by and sits with him daily. Per nursing patient will DC home today. Patient calling daughter for a ride home. Patient is eligible for [facility] services, CM will order EMMI transition calls.   Expected Discharge Date:    05/20/2017              Expected Discharge Plan:     In-House Referral:     Discharge planning Services  CM Consult  Post Acute Care Choice:    Choice offered to:     DME Arranged:    DME Agency:     HH Arranged:   patient refused [place] Agency:     Status of Service:  In process, will continue to follow  If discussed at Long Length of Stay Meetings, dates discussed:    Additional Comments:  Viet Kemmerer, Chauncey Reading, RN 05/19/2017, 2:08 PM
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
## 2017-05-19 NOTE — Evaluation (Signed)
Physical Therapy Evaluation Patient Details Name: Keith Doyle MRN: [id number] DOB: Jun 10, 1936 Today's Date: 05/19/2017   History of Present Illness   Keith Doyle is a 81 y.o. male with medical history significant of atrial fibrillation and previous DVT, on anticoagulation, hypertension, hyperlipidemia . history is somewhat limited since patient is somnolent. He reports that he was in usual state of health until last night when he started developing pain and swelling in his right lower extremity. He was found this morning by a friend when he was noted be confused. He was apparently slurring his speech, had difficulty moving his extremities. He denied any vomiting, diarrhea, dysuria, chest pain or abdominal pain.    Clinical Impression  Patient demonstrated good return for gait training in hallway without loss of balance, limited secondary to c/o fatigue and mild lightheadedness.  Patient will benefit from continued physical therapy in hospital and recommended venue below to increase strength, balance, endurance for safe ADLs and gait.    Follow Up Recommendations SNF;Supervision/Assistance - 24 hour    Equipment Recommendations  None recommended by PT    Recommendations for Other Services       Precautions / Restrictions Precautions Precautions: Fall Restrictions Weight Bearing Restrictions: No      Mobility  Bed Mobility Overal bed mobility: Needs Assistance Bed Mobility: Sit to Supine;Supine to Sit     Supine to sit: Min assist Sit to supine: Min assist      Transfers Overall transfer level: Needs assistance Equipment used: Rolling walker (2 wheeled) Transfers: Sit to/from Stand Sit to Stand: Min assist            Ambulation/Gait Ambulation/Gait assistance: Museum/gallery curator (Feet): 30 Feet Assistive device: Rolling walker (2 wheeled) Gait Pattern/deviations: Decreased step length - right;Decreased step length - left;Decreased stride length   Gait  velocity interpretation: Below normal speed for age/gender General Gait Details: Demonstrates slow slightly labored cadence without loss of balance, limited due to increasing right sided low back pain  Stairs            Wheelchair Mobility    Modified Rankin (Stroke Patients Only)       Balance Overall balance assessment: Needs assistance Sitting-balance support: Feet supported Sitting balance-Leahy Scale: Good     Standing balance support: Bilateral upper extremity supported;During functional activity Standing balance-Leahy Scale: Fair                               Pertinent Vitals/Pain Pain Assessment: 0-10 Pain Score: 4  Pain Location: right side of low back  Pain Descriptors / Indicators: Aching Pain Intervention(s): Limited activity within patient's tolerance;Monitored during session    Omar expects to be discharged to:: Private residence Living Arrangements: Alone Available Help at Discharge: Family;Friend(s) Type of Home: House Home Access: Ramped entrance     Home Layout: One level Home Equipment: Environmental consultant - 2 wheels Additional Comments: patient states he has grab bars in bathroom and hallway    Prior Function Level of Independence: Independent with assistive device(s)         Comments: has RW, but does not usse     Hand Dominance        Extremity/Trunk Assessment   Upper Extremity Assessment Upper Extremity Assessment: Generalized weakness    Lower Extremity Assessment Lower Extremity Assessment: Generalized weakness    Cervical / Trunk Assessment Cervical / Trunk Assessment: Normal  Communication   Communication:  No difficulties  Cognition Arousal/Alertness: Awake/alert Behavior During Therapy: WFL for tasks assessed/performed Overall Cognitive Status: Within Functional Limits for tasks assessed                                        General Comments      Exercises General  Exercises - Lower Extremity Ankle Circles/Pumps: Seated;AROM;Both;10 reps Long Arc Quad: Seated;AROM;Both;10 reps Hip Flexion/Marching: AROM;Seated;Both;10 reps   Assessment/Plan    PT Assessment Patient needs continued PT services  PT Problem List Decreased strength;Decreased activity tolerance;Decreased balance;Decreased mobility       PT Treatment Interventions Gait training;Stair training;Functional mobility training;Therapeutic activities;Therapeutic exercise;Patient/family education    PT Goals (Current goals can be found in the Care Plan section)  Acute Rehab PT Goals Patient Stated Goal: Return home after rehab PT Goal Formulation: With patient Time For Goal Achievement: 05/26/17 Potential to Achieve Goals: Good    Frequency Min 3X/week   Barriers to discharge        Co-evaluation               AM-PAC PT "6 Clicks" Daily Activity  Outcome Measure Difficulty turning over in bed (including adjusting bedclothes, sheets and blankets)?: Unable Difficulty moving from lying on back to sitting on the side of the bed? : Unable Difficulty sitting down on and standing up from a chair with arms (e.g., wheelchair, bedside commode, etc,.)?: Unable Help needed moving to and from a bed to chair (including a wheelchair)?: A Little Help needed walking in hospital room?: A Little Help needed climbing 3-5 steps with a railing? : A Little 6 Click Score: 12    End of Session Equipment Utilized During Treatment: Gait belt Activity Tolerance: Patient tolerated treatment well;Patient limited by fatigue Patient left: in bed;with call bell/phone within reach;with chair alarm set Nurse Communication: Mobility status PT Visit Diagnosis: Unsteadiness on feet (R26.81);Other abnormalities of gait and mobility (R26.89);Muscle weakness (generalized) (M62.81)    Time: 1000-1033 PT Time Calculation (min) (ACUTE ONLY): 33 min   Charges:   PT Evaluation $PT Eval Low Complexity: 1 Low PT  Treatments $Gait Training: 23-37 mins   PT G Codes:        4:01 PM, 05-20-2017 Lonell Grandchild, MPT Physical Therapist with [facility] 336 [phone] office [phone] mobile phone

## 2017-05-19 NOTE — Progress Notes (Signed)
Patient ID: Keith Doyle, male   DOB: 09/13/1935, 81 y.o.   MRN: [id number]    PROGRESS NOTE   Keith Doyle  MRN:[id number] DOB: 11/25/1935 DOA: 05/17/2017  PCP: Bradshaw, Samuel L, MD   Brief Narrative:  Patient is 81-year-old male with known atrial fibrillation and previous DVT, anticoagulated with Coumadin, hypertension, hyperlipidemia, presented with main concern of sudden onset of worsening pain and swelling in the right lower extremity. Family also reported noticing worsening confusion and difficulty moving his right lower extremity. Upon arrival to emergency department, patient had temperature of 101F and was tachycardic, noted to be lethargic. Patient was started on IV antibiotics and TRH asked to admit for further evaluation.  Assessment & Plan:   Active Problems:   Sepsis secondary to right lower extremity cellulitis - Please note that patient met criteria for sepsis on admission - Patient has been started on vancomycin and Zosyn  - Patient is clinically improving, edema and erythema overall better - We'll continue same antibiotic regimen today, day #3, plan to change to oral antibiotics in next 24 hours - Continue to provide supportive care with analgesia as needed, keep extremity elevated - WBC is trending down and is within normal limits this morning    Right lower extremity DVT - Vascular Doppler positive for nonocclusive thrombus in the mid to lower right femoral vein and popliteal vein - Patient has been started on heparin drip - We will change anticoagulation to Xarelto    Acute metabolic encephalopathy - Secondary to the above - Mental status stable and at baseline    Paroxysmal atrial fibrillation - Rate has been controlled, change anticoagulation to Xarelto as noted above    Essential hypertension - Reasonable inpatient control so far - Continue Norvasc and benazepril    Hyperlipidemia - Continue statin    Hypokalemia - Has been supplemented and remains  within normal limits    Diabetes mellitus with neuropathy (HCC) - Keep on sliding scale insulin until oral intake improves - Continue gabapentin for neuropathy  DVT prophylaxis: Xarelto Code Status: Full Family Communication: Patient at bedside Disposition Plan: Plan to discharge home once medically stable  Consultants:   None  Procedures:   Lower extremity vascular Doppler 05/17/2017 -->   Antimicrobials:   Vancomycin 05/17/2017 -->  Zosyn 05/17/2017-->  Subjective: Patient reports feeling better, less pain in right lower extremity.  Objective: Vitals:   05/17/17 2120 05/18/17 0609 05/18/17 2030 05/19/17 0222  BP: 123/67 132/64 (!) 139/59 (!) 146/87  Pulse: 89 82 83 95  Resp: 16 16 20 16  Temp: 98.4 F (36.9 C) 99.4 F (37.4 C) 98.6 F (37 C) 98.4 F (36.9 C)  TempSrc: Oral Oral Oral Axillary  SpO2: 96% 95% 96% 98%  Weight:      Height:        Intake/Output Summary (Last 24 hours) at 05/19/17 1040 Last data filed at 05/19/17 0608  Gross per 24 hour  Intake             1220 ml  Output             7225 ml  Net            -6005 ml   Filed Weights   05/17/17 1033 05/17/17 1627  Weight: 95.7 kg (211 lb) 92.4 kg (203 lb 11.3 oz)    Physical Exam  Constitutional: Appears well-developed and well-nourished.  CVS: IRRR, S1/S2 +, no gallops, no carotid bruit.  Pulmonary: Effort and breath sounds   normal, no stridor Abdominal: Soft. BS +,  no distension, tenderness, rebound or guarding.  Musculoskeletal: Normal range of motion. Bilateral lower extremity pitting edema +1, somewhat worse on the right side  Lymphadenopathy: No lymphadenopathy noted, cervical, inguinal. Neuro: Alert. Normal reflexes, muscle tone coordination. No cranial nerve deficit. Skin: Bilateral lower extremity chronic venous stasis changes, less erythema in right lower extremities, less tenderness to palpation   Data Reviewed: I have personally reviewed following labs and imaging  studies  CBC:  Recent Labs Lab 05/17/17 1202 05/18/17 0222 05/19/17 0451  WBC 18.3* 10.8* 6.9  NEUTROABS 16.6*  --   --   HGB 12.0* 10.7* 10.7*  HCT 37.2* 33.6* 33.4*  MCV 89.9 91.1 91.5  PLT 226 169 759   Basic Metabolic Panel:  Recent Labs Lab 05/17/17 1202 05/18/17 0222 05/19/17 0451  NA 139 141 144  K 3.1* 3.5 3.7  CL 104 113* 114*  CO2 _0 GLUCOSE 153* 169* 159*  BUN _1 CREATININE 0.91 0.93 0.85  CALCIUM 9.1 7.9* 8.7*  MG  --   --  1.9   Liver Function Tests:  Recent Labs Lab 05/17/17 1202  AST 21  ALT 24  ALKPHOS 42  BILITOT 0.6  PROT 6.2*  ALBUMIN 3.4*   Coagulation Profile:  Recent Labs Lab 05/17/17 1212  INR 1.54   CBG:  Recent Labs Lab 05/18/17 0744 05/18/17 1107 05/18/17 1634 05/18/17 2027 05/19/17 0734  GLUCAP 117* 204* 159* 163* 149*   Urine analysis:    Component Value Date/Time   COLORURINE YELLOW 05/17/2017 1153   APPEARANCEUR CLEAR 05/17/2017 1153   APPEARANCEUR Clear 07/16/2016 1754   LABSPEC 1.011 05/17/2017 1153   PHURINE 8.0 05/17/2017 1153   GLUCOSEU NEGATIVE 05/17/2017 1153   HGBUR NEGATIVE 05/17/2017 1153   BILIRUBINUR NEGATIVE 05/17/2017 1153   BILIRUBINUR Negative 07/16/2016 1754   KETONESUR NEGATIVE 05/17/2017 1153   PROTEINUR NEGATIVE 05/17/2017 1153   UROBILINOGEN 0.2 08/30/2011 0540   NITRITE NEGATIVE 05/17/2017 1153   LEUKOCYTESUR NEGATIVE 05/17/2017 1153   LEUKOCYTESUR Negative 07/16/2016 1754   Recent Results (from the past 240 hour(s))  Urine culture     Status: Abnormal (Preliminary result)   Collection Time: 05/17/17 11:53 AM  Result Value Ref Range Status   Specimen Description URINE, CATHETERIZED  Final   Special Requests NONE  Final   Culture 10,000 COLONIES/mL MORGANELLA MORGANII (A)  Final   Report Status PENDING  Incomplete  Blood Culture (routine x 2)     Status: None (Preliminary result)   Collection Time: 05/17/17 12:08 PM  Result Value Ref Range Status   Specimen  Description RIGHT ANTECUBITAL  Final   Special Requests   Final    BOTTLES DRAWN AEROBIC AND ANAEROBIC Blood Culture adequate volume   Culture NO GROWTH < 24 HOURS  Final   Report Status PENDING  Incomplete  Blood Culture (routine x 2)     Status: None (Preliminary result)   Collection Time: 05/17/17 12:09 PM  Result Value Ref Range Status   Specimen Description BLOOD LEFT WRIST  Final   Special Requests   Final    BOTTLES DRAWN AEROBIC ONLY Blood Culture adequate volume   Culture NO GROWTH < 24 HOURS  Final   Report Status PENDING  Incomplete    Radiology Studies: US Venous Img Lower Unilateral Right Result Date: 05/17/2017 Positive study for DVT right lower extremity. Nonocclusive thrombus is in the mid to lower right femoral vein and popliteal  vein .   Dg Chest Port 1 View Result Date: 05/17/2017 Stable moderate hiatal hernia with associated medial bibasilar compressive atelectasis. Otherwise no active cardiopulmonary disease.   Scheduled Meds: . amLODipine  5 mg Oral Daily  . atorvastatin  40 mg Oral q1800  . benazepril  40 mg Oral BID  . fenofibrate  160 mg Oral Daily  . gabapentin  800 mg Oral BID  . insulin aspart  0-15 Units Subcutaneous TID WC  . potassium chloride SA  20 mEq Oral Daily   Continuous Infusions: . heparin 2,000 Units/hr (05/18/17 2314)  . piperacillin-tazobactam (ZOSYN)  IV 3.375 g (05/19/17 5035)  . vancomycin Stopped (05/19/17 0130)     LOS: 2 days   Time spent: 25 minutes   Faye Ramsay, MD [facility] Pager [phone]  If 7PM-7AM, please contact night-coverage [email or web address] Password [email or web address] 05/19/2017, 10:40 AM

## 2017-05-20 LAB — GLUCOSE, CAPILLARY
GLUCOSE-CAPILLARY: 189 mg/dL — AB (ref 65–99)
Glucose-Capillary: 138 mg/dL — ABNORMAL HIGH (ref 65–99)

## 2017-05-20 LAB — BASIC METABOLIC PANEL
ANION GAP: 8 (ref 5–15)
BUN: 12 mg/dL (ref 6–20)
CALCIUM: 8.7 mg/dL — AB (ref 8.9–10.3)
CO2: 25 mmol/L (ref 22–32)
Chloride: 108 mmol/L (ref 101–111)
Creatinine, Ser: 0.91 mg/dL (ref 0.61–1.24)
Glucose, Bld: 146 mg/dL — ABNORMAL HIGH (ref 65–99)
POTASSIUM: 3.5 mmol/L (ref 3.5–5.1)
Sodium: 141 mmol/L (ref 135–145)

## 2017-05-20 LAB — URINE CULTURE: Culture: 10000 — AB

## 2017-05-20 MED ORDER — DOXYCYCLINE HYCLATE 100 MG PO TABS
100.0000 mg | ORAL_TABLET | Freq: Two times a day (BID) | ORAL | Status: DC
  Administered 2017-05-20: 100 mg via ORAL
  Filled 2017-05-20: qty 1

## 2017-05-20 MED ORDER — DOXYCYCLINE HYCLATE 100 MG PO TABS: 100.0000 mg | ORAL_TABLET | Freq: Two times a day (BID) | ORAL | 1 refills | Status: DC

## 2017-05-20 MED ORDER — RIVAROXABAN (XARELTO) VTE STARTER PACK (15 & 20 MG): ORAL_TABLET | ORAL | 0 refills | Status: DC

## 2017-05-20 NOTE — Discharge Instructions (Signed)

## 2017-05-20 NOTE — Progress Notes (Signed)
Discharge instructions gone over with patient, verbalized understanding. Printed prescriptions given to patient. IV's removed, patient tolerated procedure well.

## 2017-05-20 NOTE — Clinical Social Work Note (Signed)
Patient plans to return to his home at discharge.    Alajia Schmelzer, Clydene Pugh, LCSW

## 2017-05-20 NOTE — Discharge Summary (Signed)
Physician Discharge Summary  Keith Doyle MGQ:[id number] DOB: [birth year] DOA: 05/17/2017  PCP: Keith Euler, MD  Admit date: 05/17/2017 Discharge date: 05/20/2017  Recommendations for Outpatient Follow-up:  1. Pt will need to follow up with PCP in 1-2 weeks post discharge 2. Please obtain BMP to evaluate electrolytes and kidney function 3. Please also check CBC to evaluate Hg and Hct levels 4. Please note that Coumadin was discontinuedAs patient developed right lower extremity DVT, patient started on Xarelto 5. Please also note that patient was discharged on doxycycline to complete therapy for right lower extremity cellulitis  Discharge Diagnoses:  Active Problems:   Essential hypertension   Hyperlipidemia   Hypokalemia   Diabetes mellitus with neuropathy (HCC)   Paroxysmal atrial fibrillation (HCC)   HLD (hyperlipidemia)   Cellulitis   Right leg DVT (HCC)   Cellulitis of right lower extremity  Discharge Condition: Stable  Diet recommendation: Heart healthy diet discussed in details   History of present illness:  Patient is 81 year old male with known atrial fibrillation and previous DVT, anticoagulated with Coumadin, hypertension, hyperlipidemia, presented with main concern of sudden onset of worsening pain and swelling in the right lower extremity. Family also reported noticing worsening confusion and difficulty moving his right lower extremity. Upon arrival to emergency department, patient had temperature of 101F and was tachycardic, noted to be lethargic. Patient was started on IV antibiotics and TRH asked to admit for further evaluation.  Assessment & Plan:   Active Problems:   Sepsis secondary to right lower extremity cellulitis - Please note that patient met criteria for sepsis on admission - Patient has been started on vancomycin and Zosyn  - Patient is clinically improving, edema and erythema overall better - White blood cell has trended down and is within  normal limits, patient is afebrile and wants to go home - We'll change antibiotic to doxycycline for patient to complete therapy    Right lower extremity DVT - Vascular Doppler positive for nonocclusive thrombus in the mid to lower right femoral vein and popliteal vein - Coumadin has been discontinued and patient started on Xarelto    Acute metabolic encephalopathy - Mental status at baseline    Paroxysmal atrial fibrillation - Rate has been controlled, change anticoagulation to Xarelto as noted above    Essential hypertension - Resume home medical regimen    Hyperlipidemia - Continue statin    Hypokalemia - Has been supplemented    Diabetes mellitus with neuropathy ([place]) - Resume home medical regimen  Urine culture that was obtained on admission (unclear reason why obtained), grew 10,000 colonies of Morganella species, patient with no symptoms that are worrisome for acute urinary tract infection, will therefore not treat with specific antibiotic.  DVT prophylaxis: Xarelto Code Status: Full Family Communication: Patient at bedside Disposition Plan: Plan to discharge home once medically stable  Consultants:   None  Procedures:   Lower extremity vascular Doppler 05/17/2017 -->   Antimicrobials:   Vancomycin 05/17/2017 -->  Zosyn 05/17/2017-->  Procedures/Studies: US Venous Img Lower Unilateral Right  Result Date: 05/17/2017 CLINICAL DATA:  Right lower extremity edema. EXAM: RIGHT LOWER EXTREMITY VENOUS DOPPLER ULTRASOUND TECHNIQUE: Gray-scale sonography with graded compression, as well as color Doppler and duplex ultrasound were performed to evaluate the lower extremity deep venous systems from the level of the common femoral vein and including the common femoral, femoral, profunda femoral, popliteal and calf veins including the posterior tibial, peroneal and gastrocnemius veins when visible. The superficial great saphenous vein was  also interrogated.  Spectral Doppler was utilized to evaluate flow at rest and with distal augmentation maneuvers in the common femoral, femoral and popliteal veins. COMPARISON:  Knee series 7 02/23/2017, 02/08/2005 .  CT 09/01/2010 FINDINGS: Contralateral Common Femoral Vein: Respiratory phasicity is normal and symmetric with the symptomatic side. No evidence of thrombus. Normal compressibility. Common Femoral Vein: No evidence of thrombus. Normal compressibility, respiratory phasicity and response to augmentation. Saphenofemoral Junction: No evidence of thrombus. Normal compressibility and flow on color Doppler imaging. Profunda Femoral Vein: No evidence of thrombus. Normal compressibility and flow on color Doppler imaging. Femoral Vein: Nonocclusive thrombus noted in the mid to lower femoral vein. Popliteal Vein: Nonocclusive thrombus noted in the popliteal vein. Calf Veins: No evidence of thrombus. Normal compressibility and flow on color Doppler imaging. Superficial Great Saphenous Vein: No evidence of thrombus. Normal compressibility and flow on color Doppler imaging. Venous Reflux:  None. Other Findings:  None. IMPRESSION: Positive study for DVT right lower extremity. Nonocclusive thrombus is in the mid to lower right femoral vein and popliteal vein . Electronically Signed   By: Marcello Moores  Register   On: 05/17/2017 13:20   Dg Chest Port 1 View  Result Date: 05/17/2017 CLINICAL DATA:  Fever.  Sepsis. EXAM: PORTABLE CHEST 1 VIEW COMPARISON:  08/10/2013 chest radiograph. FINDINGS: Stable cardiomediastinal silhouette with top-normal heart size and moderate hiatal hernia. No pneumothorax. No pleural effusion. No pulmonary edema. Mild compressive atelectasis in the medial lung bases bilaterally. No acute consolidative airspace disease. IMPRESSION: Stable moderate hiatal hernia with associated medial bibasilar compressive atelectasis. Otherwise no active cardiopulmonary disease. Electronically Signed   By: Ilona Sorrel M.D.   On:  05/17/2017 12:24    Discharge Exam: Vitals:   05/19/17 2031 05/20/17 0523  BP: 130/83 (!) 153/92  Pulse: 92 67  Resp: 20 20  Temp: 98.4 F (36.9 C) 98.6 F (37 C)  SpO2: 98% 97%   Vitals:   05/19/17 1323 05/19/17 2005 05/19/17 2031 05/20/17 0523  BP: 137/88  130/83 (!) 153/92  Pulse: 91  92 67  Resp: _0 Temp: 98.6 F (37 C)  98.4 F (36.9 C) 98.6 F (37 C)  TempSrc: Oral  Oral Oral  SpO2: 99% 93% 98% 97%  Weight:    90.9 kg (200 lb 6.4 oz)  Height:        General: Pt is alert, follows commands appropriately, not in acute distress Cardiovascular: Regular rate and rhythm, S1/S2 +, no rubs, no gallops Respiratory: Clear to auscultation bilaterally, no wheezing, no crackles, no rhonchi Abdominal: Soft, non tender, non distended, bowel sounds +, no guarding Extremities: Right lower extremity edema +1, no erythema and no tenderness to palpation  Discharge Instructions  Discharge Instructions    Diet - low sodium heart healthy    Complete by:  As directed    Increase activity slowly    Complete by:  As directed      Allergies as of 05/20/2017   No Known Allergies     Medication List    STOP taking these medications   B-D SINGLE USE SWABS REGULAR Pads   cephALEXin 500 MG capsule Commonly known as:  KEFLEX   TRUE METRIX AIR GLUCOSE METER Devi   warfarin 5 MG tablet Commonly known as:  COUMADIN     TAKE these medications   Acetaminophen 500 MG coapsule Take 500 mg by mouth as needed for pain.   amLODipine 5 MG tablet Commonly known as:  NORVASC Take 1  tablet (5 mg total) by mouth daily.   atorvastatin 40 MG tablet Commonly known as:  LIPITOR TAKE 1 TABLET EVERY DAY   benazepril 40 MG tablet Commonly known as:  LOTENSIN TAKE 1 TABLET TWICE DAILY   doxycycline 100 MG tablet Commonly known as:  VIBRA-TABS Take 1 tablet (100 mg total) by mouth every 12 (twelve) hours.   fenofibrate 160 MG tablet TAKE 1 TABLET EVERY DAY   ferrous sulfate  325 (65 FE) MG tablet Take 1 tablet (325 mg total) by mouth 3 (three) times daily with meals.   furosemide 20 MG tablet Commonly known as:  LASIX TAKE 1 TABLET TWICE DAILY  - APPOINTMENT IS NEEDED   gabapentin 400 MG capsule Commonly known as:  NEURONTIN Take 2 capsules (800 mg total) by mouth 2 (two) times daily.   pantoprazole 40 MG tablet Commonly known as:  PROTONIX TAKE 1 TABLET TWICE DAILY  BEFORE  MEAL   potassium chloride SA 20 MEQ tablet Commonly known as:  K-DUR,KLOR-CON TAKE TWO TABLETS BY MOUTH TWICE DAILY   Rivaroxaban 15 & 20 MG Tbpk Take as directed on package: Start with one 71m tablet by mouth twice a day with food. On Day 22, switch to one 295mtablet once a day with food.  Please note that Rivaroxaban was started on 05/19/2017 while in hospital.   triamcinolone ointment 0.5 % Commonly known as:  KENALOG Apply 1 application topically 2 (two) times daily.   Vitamin D-3 5000 units Tabs Take 5,000 Units by mouth daily.            Discharge Care Instructions        Start     Ordered   05/20/17 0000  Rivaroxaban 15 & 20 MG TBPK     05/20/17 1200   05/20/17 0000  Increase activity slowly     05/20/17 1200   05/20/17 0000  Diet - low sodium heart healthy     05/20/17 1200   05/20/17 0000  doxycycline (VIBRA-TABS) 100 MG tablet  Every 12 hours     05/20/17 1202     Follow-up Information    BrTimmothy EulerMD Follow up.   Specialty:  Family Medicine Contact information: [id number]        MyTheodis BlazeMD Follow up.   Specialty:  Internal Medicine Why:  Please call me with questions, [phone] information: [address] [phone]          The results of significant diagnostics from this hospitalization (including imaging, microbiology, ancillary and laboratory) are listed below for reference.     Microbiology: Recent Results (from the  past 240 hour(s))  Urine culture     Status: Abnormal   Collection Time: 05/17/17 11:53 AM  Result Value Ref Range Status   Specimen Description URINE, CATHETERIZED  Final   Special Requests NONE  Final   Culture 10,000 COLONIES/mL MORGANELLA MORGANII (A)  Final   Report Status 05/20/2017 FINAL  Final   Organism ID, Bacteria MORGANELLA MORGANII (A)  Final      Susceptibility   Morganella morganii - MIC*    AMPICILLIN >=32 RESISTANT Resistant     CEFAZOLIN >=64 RESISTANT Resistant     CEFTRIAXONE <=1 SENSITIVE Sensitive     CIPROFLOXACIN <=0.25 SENSITIVE Sensitive     GENTAMICIN <=1 SENSITIVE Sensitive     IMIPENEM 4 SENSITIVE Sensitive     NITROFURANTOIN  128 RESISTANT Resistant     TRIMETH/SULFA <=20 SENSITIVE Sensitive     AMPICILLIN/SULBACTAM >=32 RESISTANT Resistant     PIP/TAZO <=4 SENSITIVE Sensitive     * 10,000 COLONIES/mL MORGANELLA MORGANII  Blood Culture (routine x 2)     Status: None (Preliminary result)   Collection Time: 05/17/17 12:08 PM  Result Value Ref Range Status   Specimen Description RIGHT ANTECUBITAL  Final   Special Requests   Final    BOTTLES DRAWN AEROBIC AND ANAEROBIC Blood Culture adequate volume   Culture NO GROWTH 3 DAYS  Final   Report Status PENDING  Incomplete  Blood Culture (routine x 2)     Status: None (Preliminary result)   Collection Time: 05/17/17 12:09 PM  Result Value Ref Range Status   Specimen Description BLOOD LEFT WRIST  Final   Special Requests   Final    BOTTLES DRAWN AEROBIC ONLY Blood Culture adequate volume   Culture NO GROWTH 3 DAYS  Final   Report Status PENDING  Incomplete    Labs: Basic Metabolic Panel:  Recent Labs Lab 05/17/17 1202 05/18/17 0222 05/19/17 0451 05/20/17 0605  NA 139 141 144 141  K 3.1* 3.5 3.7 3.5  CL 104 113* 114* 108  CO2 _0 GLUCOSE 153* 169* 159* 146*  BUN _1 CREATININE 0.91 0.93 0.85 0.91  CALCIUM 9.1 7.9* 8.7* 8.7*  MG  --   --  1.9  --    Liver Function  Tests:  Recent Labs Lab 05/17/17 1202  AST 21  ALT 24  ALKPHOS 42  BILITOT 0.6  PROT 6.2*  ALBUMIN 3.4*   CBC:  Recent Labs Lab 05/17/17 1202 05/18/17 0222 05/19/17 0451  WBC 18.3* 10.8* 6.9  NEUTROABS 16.6*  --   --   HGB 12.0* 10.7* 10.7*  HCT 37.2* 33.6* 33.4*  MCV 89.9 91.1 91.5  PLT 226 169 190   CBG:  Recent Labs Lab 05/19/17 1113 05/19/17 1618 05/19/17 2027 05/20/17 0725 05/20/17 1111  GLUCAP 172* 133* 183* 138* 189*   SIGNED: Time coordinating discharge: 60 minutes  Faye Ramsay, MD  [facility] 05/20/2017, 12:02 PM Pager [phone]  If 7PM-7AM, please contact night-coverage [email or web address] Password [email or web address]

## 2017-05-22 LAB — CULTURE, BLOOD (ROUTINE X 2)
Culture: NO GROWTH
Culture: NO GROWTH
SPECIAL REQUESTS: ADEQUATE
Special Requests: ADEQUATE

## 2017-05-24 ENCOUNTER — Telehealth: Payer: Self-pay | Admitting: Family Medicine

## 2017-05-24 ENCOUNTER — Other Ambulatory Visit: Payer: Self-pay

## 2017-05-24 MED ORDER — POTASSIUM CHLORIDE CRYS ER 20 MEQ PO TBCR: 40.0000 meq | EXTENDED_RELEASE_TABLET | Freq: Two times a day (BID) | ORAL | 0 refills | Status: DC

## 2017-05-24 MED ORDER — BENAZEPRIL HCL 40 MG PO TABS: 40.0000 mg | ORAL_TABLET | Freq: Two times a day (BID) | ORAL | 0 refills | Status: DC

## 2017-05-24 NOTE — Telephone Encounter (Signed)
Keflex has been discontinued, doxy should not be sent o mail order as it is a temporary medication.   Xarelto was started in recent hospitalization, the patient needs hospital follow up for further Rx but should have a starter pack for recent DVT.  If he has a starter pack then 20 mg once daily should be sent ( preferably after hosp follow up)  Laroy Apple, MD [facility] 05/24/2017, 11:57 AM

## 2017-05-24 NOTE — Telephone Encounter (Signed)
What is the name of the medication? Cephalexin 500mg , doxycycline 100mg , benazepril 40mg , xarelto 15mg , potassium cl  Have you contacted your pharmacy to request a refill? No he wants to get all those medication refilled through [facility] mail order   Which pharmacy would you like this sent to? [facility]   Patient notified that their request is being sent to the clinical staff for review and that they should receive a call once it is complete. If they do not receive a call within 24 hours they can check with their pharmacy or our office.

## 2017-05-24 NOTE — Telephone Encounter (Signed)
atient called wanted meds filled to mail order  I filled Benzaepril and Potassium Cl  But he also wanted Cephalexin 500 mg fill for mailed order which is not on list and Doxycycline which is on med list and Xarelto which is not on list

## 2017-05-24 NOTE — Telephone Encounter (Signed)
Patient aware of medication that he is to be taking and not taking

## 2017-05-25 ENCOUNTER — Other Ambulatory Visit: Payer: Self-pay

## 2017-05-25 NOTE — Patient Outreach (Signed)
[place] [facility]) Care Management  05/25/2017  Keith Doyle 13-Oct-1935 [id number]     EMMI-General Discharge RED ON EMMI ALERT Day # 1 Date: 05/24/17 Red Alert Reason: "Know who to call about changes in condition? No   Unfilled prescriptions? Yes"     Outreach attempt # 1 to patient. No answer. RN CM left HIPAA compliant voicemail message along with contact info.       Plan: RN CM will make outreach attempt to patient within one business day if no return call from patient.    Enzo Montgomery, RN,BSN,CCM [place] [facility] Telephonic Care Management Coordinator Direct Phone: [phone] Toll Free: [phone] Fax: [phone]

## 2017-05-26 ENCOUNTER — Other Ambulatory Visit: Payer: Self-pay

## 2017-05-26 ENCOUNTER — Encounter: Payer: Self-pay | Admitting: Family Medicine

## 2017-05-26 ENCOUNTER — Ambulatory Visit (INDEPENDENT_AMBULATORY_CARE_PROVIDER_SITE_OTHER): Payer: Medicare HMO | Admitting: Family Medicine

## 2017-05-26 VITALS — BP 99/63 | HR 84 | Temp 97.4°F | Ht 74.0 in | Wt 206.6 lb

## 2017-05-26 DIAGNOSIS — L03115 Cellulitis of right lower limb: Secondary | ICD-10-CM

## 2017-05-26 DIAGNOSIS — I82401 Acute embolism and thrombosis of unspecified deep veins of right lower extremity: Secondary | ICD-10-CM

## 2017-05-26 LAB — CMP14+EGFR
A/G RATIO: 1.8 (ref 1.2–2.2)
ALT: 20 IU/L (ref 0–44)
AST: 18 IU/L (ref 0–40)
Albumin: 3.5 g/dL (ref 3.5–4.8)
Alkaline Phosphatase: 52 IU/L (ref 39–117)
BUN/Creatinine Ratio: 14 (ref 10–24)
BUN: 15 mg/dL (ref 8–27)
Bilirubin Total: <0.2 mg/dL (ref 0.0–1.2)
CALCIUM: 9 mg/dL (ref 8.6–10.2)
CO2: 23 mmol/L (ref 20–29)
CREATININE: 1.04 mg/dL (ref 0.76–1.27)
Chloride: 104 mmol/L (ref 96–106)
GFR, EST AFRICAN AMERICAN: 79 mL/min/{1.73_m2} (ref >59)
GFR, EST NON AFRICAN AMERICAN: 68 mL/min/{1.73_m2} (ref >59)
GLOBULIN, TOTAL: 2 g/dL (ref 1.5–4.5)
Glucose: 132 mg/dL — ABNORMAL HIGH (ref 65–99)
POTASSIUM: 4.2 mmol/L (ref 3.5–5.2)
SODIUM: 143 mmol/L (ref 134–144)
TOTAL PROTEIN: 5.5 g/dL — AB (ref 6.0–8.5)

## 2017-05-26 LAB — CBC WITH DIFFERENTIAL/PLATELET
Basophils Absolute: 0.1 10*3/uL (ref 0.0–0.2)
Basos: 1 %
EOS (ABSOLUTE): 0.1 10*3/uL (ref 0.0–0.4)
EOS: 2 %
HEMATOCRIT: 34.7 % — AB (ref 37.5–51.0)
Hemoglobin: 11.3 g/dL — ABNORMAL LOW (ref 13.0–17.7)
IMMATURE GRANS (ABS): 0.1 10*3/uL (ref 0.0–0.1)
IMMATURE GRANULOCYTES: 2 %
LYMPHS: 28 %
Lymphocytes Absolute: 1.8 10*3/uL (ref 0.7–3.1)
MCH: 28.8 pg (ref 26.6–33.0)
MCHC: 32.6 g/dL (ref 31.5–35.7)
MCV: 89 fL (ref 79–97)
MONOS ABS: 0.5 10*3/uL (ref 0.1–0.9)
Monocytes: 7 %
NEUTROS ABS: 3.9 10*3/uL (ref 1.4–7.0)
NEUTROS PCT: 60 %
Platelets: 253 10*3/uL (ref 150–379)
RBC: 3.92 x10E6/uL — ABNORMAL LOW (ref 4.14–5.80)
RDW: 15.8 % — ABNORMAL HIGH (ref 12.3–15.4)
WBC: 6.5 10*3/uL (ref 3.4–10.8)

## 2017-05-26 MED ORDER — RIVAROXABAN 20 MG PO TABS: 20.0000 mg | ORAL_TABLET | Freq: Every day | ORAL | 3 refills | Status: DC

## 2017-05-26 NOTE — Progress Notes (Signed)
   HPI  Patient presents today here for hospital follow-up.  Patient was admitted to hospital with acute DVT right lower extremity cellulitis.  Patient now has history of acute DVT 3, he states that he had a IVC filter placed about 20 years ago. His INR was therapeutic in the emergency room, he was changed over to [place]. He denies any apparent bleeding since starting the medication, he expresses understanding about the dosing and changing to 20 mg once daily after 3 weeks.  He states that he does feel much better since leaving the hospital, however still has some chronic fatigue.  He has finished all antibiotics and tolerated them well.  PMH: Smoking status noted ROS: Per HPI  Objective: BP 99/63   Pulse 84   Temp (!) 97.4 F (36.3 C) (Oral)   Ht '6\' 2"'$  (1.88 m)   Wt 206 lb 9.6 oz (93.7 kg)   BMI 26.53 kg/m  Gen: NAD, alert, cooperative with exam HEENT: NCAT, EOMI, PERRL CV: RRR, good S1/S2, no murmur Resp: CTABL, no wheezes, non-labored Ext: Right lower extremity edema is approximately 1-2+ pitting, 1+ pitting edema on the left lower extremity, hemosiderin staining bilaterally Neuro: Alert and oriented, No gross deficits  Assessment and plan:  # Right lower extremity cellulitis Resolved, patient has finished antibiotics and continues to do well. Rechecking labs  # DVT Acute DVT, history of 2 other DVTs, this is #3. Patient was already anticoagulated for atrial fibrillation, he was changed to Xarelto. We discussed Xarelto dosing extensively His renal function was normal and the hospital. He already has established care with hematology, he does not have history of any known clotting disorders     Orders Placed This Encounter  Procedures  . CMP14+EGFR  . CBC with Differential/Platelet    Meds ordered this encounter  Medications  . rivaroxaban (XARELTO) 20 MG TABS tablet    Sig: Take 1 tablet (20 mg total) by mouth daily with supper.    Dispense:  90 tablet      Refill:  [place], MD [facility] 05/26/2017, 11:36 AM

## 2017-05-26 NOTE — Patient Instructions (Addendum)
Great to see you!  When you are done with your current blood thinner ( xarelto) then you will take 1 pill once daily. I sent this to the pharmacy.   For the first 3 weeks you take xarelto 15 mg twice daily, after that you take 20 mg once daily.

## 2017-05-26 NOTE — Patient Outreach (Signed)
[facility]) Care Management  05/26/2017  Keith Doyle 1935-10-22 [id number]     EMMI-General Discharge RED ON EMMI ALERT Day # 1 Date: 05/24/17 Red Alert Reason: "Know who to call about changes in condition? No   Unfilled prescriptions? Yes"     Outreach attempt #2 to patient. Spoke with patient along with his son who is staying with him temporarily to help him out. Patient states he is doing fairly well. He reports that he is a little short- winded at present as he is just getting in from the grocery store and carrying all the bags in the house. RN CM reviewed and addressed red alert with patient. He voices that he doesn't have two of his meds (doxycycline and cephalexin). Per AVS summary/discharge summary advised patient he was to stop taking cephalexin. He was to continue taking doxycycline. Son voices that patient only has one tab left of med and bottle has no refills on it. He voices that patient is scheduled to go to PCP f/u appt today at 10:45am. RN CM instructed them to take discharge paperwork along with all medication bottles to MD appts so MD can review meds and refill any needed meds. They both voiced understanding. Patient declines any further RN CM needs or concerns at this time.  Advised patient that they would continue to get automated EMMI-GENERAL post discharge calls to assess how they are doing following recent hospitalization and will receive a call from a nurse if any of their responses were abnormal. Patient voiced understanding and was appreciative of f/u call.     Plan: RN CM will notify [facility] administrative assistant of case status.    Enzo Montgomery, RN,BSN,CCM [facility] Telephonic Care Management Coordinator Direct Phone: [phone] Toll Free: [phone] Fax: [phone]

## 2017-05-27 ENCOUNTER — Ambulatory Visit: Payer: Medicare HMO | Admitting: Pharmacist Clinician (PhC)/ Clinical Pharmacy Specialist

## 2017-05-27 LAB — BASIC METABOLIC PANEL: GLUCOSE: 112

## 2017-06-03 ENCOUNTER — Other Ambulatory Visit: Payer: Self-pay | Admitting: Family Medicine

## 2017-06-08 ENCOUNTER — Other Ambulatory Visit: Payer: Self-pay | Admitting: Family Medicine

## 2017-06-13 ENCOUNTER — Ambulatory Visit (INDEPENDENT_AMBULATORY_CARE_PROVIDER_SITE_OTHER): Payer: Medicare HMO | Admitting: Family Medicine

## 2017-06-13 ENCOUNTER — Encounter: Payer: Self-pay | Admitting: Family Medicine

## 2017-06-13 VITALS — BP 125/68 | HR 96 | Temp 97.5°F | Ht 74.0 in | Wt 206.2 lb

## 2017-06-13 DIAGNOSIS — E1140 Type 2 diabetes mellitus with diabetic neuropathy, unspecified: Secondary | ICD-10-CM | POA: Diagnosis not present

## 2017-06-13 DIAGNOSIS — Z23 Encounter for immunization: Secondary | ICD-10-CM | POA: Diagnosis not present

## 2017-06-13 DIAGNOSIS — I82401 Acute embolism and thrombosis of unspecified deep veins of right lower extremity: Secondary | ICD-10-CM | POA: Diagnosis not present

## 2017-06-13 NOTE — Patient Instructions (Signed)
Great to see you! Lets see you back in early December.   Remember, when you are done with twice daily 15 mg tabs of xarelto you can take 20 mg tabs once daily.

## 2017-06-13 NOTE — Progress Notes (Signed)
   HPI  Patient presents today here to follow-up for chronic medical conditions.  Type 2 diabetes A1c checked on the hospital, 6.2. Diet controlled.   DVT No worsening leg swelling but still having some RLE swelling.  Reviewed xarelto dosing again.   PMH: Smoking status noted ROS: Per HPI  Objective: BP 125/68   Pulse 96   Temp (!) 97.5 F (36.4 C) (Oral)   Ht 6\' 2"  (1.88 m)   Wt 206 lb 3.2 oz (93.5 kg)   BMI 26.47 kg/m  Gen: NAD, alert, cooperative with exam HEENT: NCAT CV: RRR, good S1/S2, no murmur Resp: CTABL, no wheezes, non-labored Ext: trace to 1+ Pitting edema RLE vs trace on the L Neuro: Alert and oriented, No gross deficits  Assessment and plan:  # DVT Recurrent, swelling improving Continue xaelto, discussed dosing.   # T2DM Doing well- diet controlled No Changes Follow up in about 2 months.    Need for flu vaccine- given, counseling provided for all components.      Orders Placed This Encounter  Procedures  . Flu vaccine HIGH DOSE PF    Meds ordered this encounter  Medications  . Rivaroxaban (XARELTO) 15 MG TABS tablet    Sig: Take 15 mg by mouth daily with supper.    Laroy Apple, MD [facility] 06/13/2017, 11:36 AM

## 2017-06-28 ENCOUNTER — Other Ambulatory Visit: Payer: Self-pay | Admitting: *Deleted

## 2017-06-28 MED ORDER — ATORVASTATIN CALCIUM 40 MG PO TABS: 40.0000 mg | ORAL_TABLET | Freq: Every day | ORAL | 1 refills | Status: DC

## 2017-07-08 LAB — BASIC METABOLIC PANEL: GLUCOSE: 221

## 2017-07-14 ENCOUNTER — Other Ambulatory Visit: Payer: Self-pay | Admitting: Gastroenterology

## 2017-07-14 ENCOUNTER — Telehealth: Payer: Self-pay | Admitting: Family Medicine

## 2017-07-14 DIAGNOSIS — K259 Gastric ulcer, unspecified as acute or chronic, without hemorrhage or perforation: Secondary | ICD-10-CM

## 2017-07-14 MED ORDER — METFORMIN HCL 500 MG PO TABS: 500.0000 mg | ORAL_TABLET | Freq: Two times a day (BID) | ORAL | 3 refills | Status: DC

## 2017-07-14 NOTE — Telephone Encounter (Signed)
Metformin sent, I do not see when it was suppose dto be sent. The last thing sent was janumet which has metformin in it.   Laroy Apple, MD [facility] 07/14/2017, 2:46 PM

## 2017-07-14 NOTE — Telephone Encounter (Signed)
Pt states he was suppose to have metformin 500mg  sent into his mail order pharmacy and he has been out for 8 days. Do not see metformin on med list. Please advise.

## 2017-07-15 NOTE — Telephone Encounter (Signed)
Patient needs ov prior to future refills. Refilled once.

## 2017-07-18 NOTE — Telephone Encounter (Signed)
Pt notified and will schedule an appointment for further refills.

## 2017-07-22 LAB — BASIC METABOLIC PANEL: GLUCOSE: 184

## 2017-07-27 ENCOUNTER — Ambulatory Visit: Payer: Medicare HMO | Admitting: Family Medicine

## 2017-07-27 ENCOUNTER — Encounter: Payer: Self-pay | Admitting: Family Medicine

## 2017-07-27 VITALS — BP 138/88 | HR 83 | Temp 97.2°F | Ht 74.0 in | Wt 205.0 lb

## 2017-07-27 DIAGNOSIS — E1140 Type 2 diabetes mellitus with diabetic neuropathy, unspecified: Secondary | ICD-10-CM

## 2017-07-27 DIAGNOSIS — Z7901 Long term (current) use of anticoagulants: Secondary | ICD-10-CM | POA: Diagnosis not present

## 2017-07-27 DIAGNOSIS — D500 Iron deficiency anemia secondary to blood loss (chronic): Secondary | ICD-10-CM | POA: Diagnosis not present

## 2017-07-27 LAB — HEMOGLOBIN, FINGERSTICK: Hemoglobin: 12.3 g/dL — ABNORMAL LOW (ref 12.6–17.7)

## 2017-07-27 LAB — BAYER DCA HB A1C WAIVED: HB A1C (BAYER DCA - WAIVED): 7.2 % — ABNORMAL HIGH (ref <7.0)
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
## 2017-07-27 NOTE — Patient Instructions (Signed)
You will be contacted with your lab results on Monday.

## 2017-07-28 LAB — PROTIME-INR
INR: 1.3 — ABNORMAL HIGH (ref 0.8–1.2)
Prothrombin Time: 13.6 s — ABNORMAL HIGH (ref 9.1–12.0)

## 2017-07-28 LAB — IRON: IRON: 45 ug/dL (ref 38–169)

## 2017-08-04 DIAGNOSIS — G8929 Other chronic pain: Secondary | ICD-10-CM | POA: Diagnosis not present

## 2017-08-04 DIAGNOSIS — M25561 Pain in right knee: Secondary | ICD-10-CM | POA: Diagnosis not present

## 2017-08-04 DIAGNOSIS — M25562 Pain in left knee: Secondary | ICD-10-CM | POA: Diagnosis not present

## 2017-08-04 DIAGNOSIS — M170 Bilateral primary osteoarthritis of knee: Secondary | ICD-10-CM | POA: Diagnosis not present

## 2017-08-05 ENCOUNTER — Other Ambulatory Visit (HOSPITAL_COMMUNITY): Payer: Medicare HMO

## 2017-08-05 ENCOUNTER — Ambulatory Visit (HOSPITAL_COMMUNITY): Payer: Medicare HMO

## 2017-08-08 ENCOUNTER — Ambulatory Visit: Payer: Medicare HMO | Admitting: Family Medicine

## 2017-08-09 ENCOUNTER — Other Ambulatory Visit: Payer: Self-pay | Admitting: Family Medicine

## 2017-08-09 ENCOUNTER — Encounter: Payer: Self-pay | Admitting: Family Medicine
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
## 2017-08-18 ENCOUNTER — Ambulatory Visit (HOSPITAL_COMMUNITY): Payer: Medicare HMO | Admitting: Adult Health

## 2017-08-18 ENCOUNTER — Other Ambulatory Visit (HOSPITAL_COMMUNITY): Payer: Medicare HMO

## 2017-08-22 ENCOUNTER — Ambulatory Visit: Payer: Medicare HMO | Admitting: Family Medicine

## 2017-08-22 ENCOUNTER — Encounter: Payer: Self-pay | Admitting: Family Medicine

## 2017-08-22 VITALS — BP 126/77 | HR 86 | Temp 97.8°F | Ht 74.0 in | Wt 206.4 lb

## 2017-08-22 DIAGNOSIS — E1140 Type 2 diabetes mellitus with diabetic neuropathy, unspecified: Secondary | ICD-10-CM

## 2017-08-22 DIAGNOSIS — I480 Paroxysmal atrial fibrillation: Secondary | ICD-10-CM | POA: Diagnosis not present

## 2017-08-22 DIAGNOSIS — E785 Hyperlipidemia, unspecified: Secondary | ICD-10-CM | POA: Diagnosis not present

## 2017-08-22 DIAGNOSIS — Z7901 Long term (current) use of anticoagulants: Secondary | ICD-10-CM

## 2017-08-22 MED ORDER — METFORMIN HCL 500 MG PO TABS: 500.0000 mg | ORAL_TABLET | Freq: Two times a day (BID) | ORAL | 3 refills | Status: DC

## 2017-08-22 NOTE — Patient Instructions (Signed)
Great to see you!  Come back in 3 months unless you need us sooner.    

## 2017-08-22 NOTE — Progress Notes (Signed)
   HPI  Patient presents today here for follow-up chronic medical conditions.  Type 2 diabetes Good medication compliance, patient is frustrated because we did not send metformin prescriptions, however Janumet was on his list.  He states this was prescribed in the hospital, it was too expensive and he only took it for 1 month.  He has been on metformin plain since that time. 34-month supply sent to mail order pharmacy.  Patient has good compliance with atorvastatin and Xarelto. We discussed differences between Coumadin and Xarelto and discussed no need for INR No bleeding   PMH: Smoking status noted ROS: Per HPI  Objective: BP 126/77   Pulse 86   Temp 97.8 F (36.6 C) (Oral)   Ht 6\' 2"  (1.88 m)   Wt 206 lb 6.4 oz (93.6 kg)   BMI 26.50 kg/m  Gen: NAD, alert, cooperative with exam HEENT: NCAT CV: RRR, good S1/S2 Resp: CTABL, no wheezes, non-labored Ext: No edema, warm Neuro: Alert and oriented, No gross deficits  Assessment and plan:  #Type 2 diabetes Reasonable control with A1c 7.2. No changes, continue metformin, refilled times 1 year  #Hyperlipidemia Well-controlled previously, planning repeat lipid panel next visit in March. Continue atorvastatin  #Chronic anticoagulation, atrial fibrillation Discussed no need for INR with Xarelto, continue Xarelto A. fib is rate controlled. No changes   Meds ordered this encounter  Medications  . metFORMIN (GLUCOPHAGE) 500 MG tablet    Sig: Take 1 tablet (500 mg total) by mouth 2 (two) times daily with a meal.    Dispense:  180 tablet    Refill:  [place], MD [facility] 08/22/2017, 9:22 AM

## 2017-08-29 ENCOUNTER — Other Ambulatory Visit: Payer: Self-pay | Admitting: Family Medicine

## 2017-08-30 LAB — BASIC METABOLIC PANEL: Glucose: 192

## 2017-09-09 ENCOUNTER — Ambulatory Visit: Payer: Medicare HMO | Admitting: Gastroenterology

## 2017-09-10 ENCOUNTER — Other Ambulatory Visit: Payer: Self-pay | Admitting: Family Medicine

## 2017-09-23 DIAGNOSIS — H04123 Dry eye syndrome of bilateral lacrimal glands: Secondary | ICD-10-CM | POA: Diagnosis not present

## 2017-09-23 DIAGNOSIS — H40033 Anatomical narrow angle, bilateral: Secondary | ICD-10-CM | POA: Diagnosis not present

## 2017-09-30 LAB — BASIC METABOLIC PANEL: Glucose: 195

## 2017-10-31 ENCOUNTER — Ambulatory Visit (INDEPENDENT_AMBULATORY_CARE_PROVIDER_SITE_OTHER): Payer: Medicare HMO | Admitting: Family

## 2017-10-31 ENCOUNTER — Encounter: Payer: Self-pay | Admitting: Family

## 2017-10-31 VITALS — BP 117/74 | HR 99 | Temp 98.6°F | Ht 74.0 in | Wt 207.8 lb

## 2017-10-31 DIAGNOSIS — L209 Atopic dermatitis, unspecified: Secondary | ICD-10-CM

## 2017-10-31 MED ORDER — BETAMETHASONE DIPROPIONATE 0.05 % EX LOTN: TOPICAL_LOTION | Freq: Two times a day (BID) | CUTANEOUS | 0 refills | Status: AC

## 2017-10-31 NOTE — Progress Notes (Signed)
   Subjective:    Patient ID: Keith Doyle, male    DOB: 1935/12/04, 82 y.o.   MRN: [id number]  Rash  This is a new problem. The current episode started 1 to 4 weeks ago. The problem is unchanged. The affected locations include the left arm, back, right arm, right hand, right wrist, left wrist and left hand. The rash is characterized by itchiness and dryness. Pertinent negatives include no cough, fatigue, fever, shortness of breath or sore throat. Past treatments include antihistamine, anti-itch cream, cold compress and moisturizer. The treatment provided mild relief.      Review of Systems  Constitutional: Negative for fatigue and fever.  HENT: Negative for sore throat.   Respiratory: Negative for cough and shortness of breath.   Skin: Positive for rash.  All other systems reviewed and are negative.      Objective:   Physical Exam  Constitutional: He is oriented to person, place, and time. He appears well-developed and well-nourished. No distress.  HENT:  Head: Normocephalic.  Eyes: Pupils are equal, round, and reactive to light. Right eye exhibits no discharge. Left eye exhibits no discharge.  Neck: Normal range of motion. Neck supple. No thyromegaly present.  Cardiovascular: Normal rate, regular rhythm, normal heart sounds and intact distal pulses.  No murmur heard. Pulmonary/Chest: Effort normal and breath sounds normal. No respiratory distress. He has no wheezes.  Abdominal: Soft. Bowel sounds are normal. He exhibits no distension. There is no tenderness.  Musculoskeletal: Normal range of motion. He exhibits no edema or tenderness.  Neurological: He is alert and oriented to person, place, and time.  Skin: Skin is warm and dry. No rash noted. No erythema.  Very dry, scaly skin  on bilateral arms and legs, and back  Psychiatric: He has a normal mood and affect. His behavior is normal. Judgment and thought content normal.  Vitals reviewed.     BP 117/74   Pulse 99   Temp  98.6 F (37 C) (Oral)   Ht 6\' 2"  (1.88 m)   Wt 207 lb 12.8 oz (94.3 kg)   BMI 26.68 kg/m      Assessment & Plan:  1. Atopic dermatitis, unspecified type Do not scratch Avoid hot showers Apply moisturizer as soon as you get out of shower RTO Prn   - betamethasone dipropionate 0.05 % lotion; Apply topically 2 (two) times daily.  Dispense: 120 mL; Refill: 0    Evelina Dun, FNP

## 2017-10-31 NOTE — Patient Instructions (Signed)

## 2017-11-03 ENCOUNTER — Telehealth: Payer: Self-pay | Admitting: Gastroenterology

## 2017-11-03 ENCOUNTER — Ambulatory Visit: Payer: Medicare HMO | Admitting: Gastroenterology

## 2017-11-03 ENCOUNTER — Encounter: Payer: Self-pay | Admitting: Gastroenterology

## 2017-11-03 NOTE — Telephone Encounter (Signed)
PATIENT WAS A NO SHOW AND LETTER SENT  °

## 2017-11-04 LAB — BASIC METABOLIC PANEL: GLUCOSE: 114

## 2017-11-21 ENCOUNTER — Ambulatory Visit (INDEPENDENT_AMBULATORY_CARE_PROVIDER_SITE_OTHER): Payer: Medicare HMO

## 2017-11-21 ENCOUNTER — Telehealth: Payer: Self-pay

## 2017-11-21 ENCOUNTER — Encounter: Payer: Self-pay | Admitting: Family Medicine

## 2017-11-21 ENCOUNTER — Ambulatory Visit (INDEPENDENT_AMBULATORY_CARE_PROVIDER_SITE_OTHER): Payer: Medicare HMO | Admitting: Family Medicine

## 2017-11-21 ENCOUNTER — Telehealth: Payer: Self-pay | Admitting: Family Medicine

## 2017-11-21 VITALS — BP 118/63 | HR 88 | Temp 97.1°F | Ht 74.0 in | Wt 211.8 lb

## 2017-11-21 DIAGNOSIS — E1140 Type 2 diabetes mellitus with diabetic neuropathy, unspecified: Secondary | ICD-10-CM | POA: Diagnosis not present

## 2017-11-21 DIAGNOSIS — I480 Paroxysmal atrial fibrillation: Secondary | ICD-10-CM

## 2017-11-21 DIAGNOSIS — M79672 Pain in left foot: Secondary | ICD-10-CM

## 2017-11-21 DIAGNOSIS — M19072 Primary osteoarthritis, left ankle and foot: Secondary | ICD-10-CM | POA: Diagnosis not present

## 2017-11-21 LAB — BAYER DCA HB A1C WAIVED: HB A1C (BAYER DCA - WAIVED): 6.8 % (ref <7.0)

## 2017-11-21 NOTE — Telephone Encounter (Signed)
Patient aware of x ray results - wanting to know why has his foot been swelling and having pain. Please advise

## 2017-11-21 NOTE — Progress Notes (Signed)
   HPI  Patient presents today here for follow up  L foot pain Off and on pain with walking or standing for 2 months, no injury Some swelling of BL LE  A fib Taking xarelto- no bleeding  T2DM Not checking CBGs frequently Good med compliance Does have dietary indiscretion.   PMH: Smoking status noted ROS: Per HPI  Objective: BP 118/63   Pulse 88   Temp (!) 97.1 F (36.2 C) (Oral)   Ht '6\' 2"'$  (1.88 m)   Wt 211 lb 12.8 oz (96.1 kg)   BMI 27.19 kg/m  Gen: NAD, alert, cooperative with exam HEENT: NCAT CV: regulr rate, irreg irreg, good S1/S2, no murmur Resp: CTABL, no wheezes, non-labored Ext: No edema, warm Neuro: Alert and oriented, No gross deficits  Assessment and plan:  # A fib Rate controlled COntinue Xarelto, cbc today, monitor renal function as this is renally dosed.   # T2Dm Previously controlled, no changes A1C pending Continue metformin  # L foot pain TTP over L prox 4th and 5th metatarsals XR pending 6 weeks of pain Treat conservatively unless Fx     Orders Placed This Encounter  Procedures  . DG Foot Complete Left    Standing Status:   Future    Standing Expiration Date:   01/22/2019    Order Specific Question:   Reason for Exam (SYMPTOM  OR DIAGNOSIS REQUIRED)    Answer:   L foot pain over proximal 4th-5th metatarsal    Order Specific Question:   Preferred imaging location?    Answer:   Internal    Order Specific Question:   Radiology Contrast Protocol - do NOT remove file path    Answer:   [email or web address]  . Bayer DCA Hb A1c Waived  . CMP14+EGFR  . CBC with Differential/Platelet  . Lipid panel  . Bayer [facility] Hb A1c Carney Bern, MD [facility] 11/21/2017, 8:24 AM

## 2017-11-21 NOTE — Addendum Note (Signed)
Addended by: Timmothy Euler on: 11/21/2017 05:24 PM   Modules accepted: Orders

## 2017-11-22 ENCOUNTER — Other Ambulatory Visit: Payer: Self-pay | Admitting: *Deleted

## 2017-11-22 LAB — CBC WITH DIFFERENTIAL/PLATELET
BASOS: 1 %
Basophils Absolute: 0 10*3/uL (ref 0.0–0.2)
EOS (ABSOLUTE): 0.2 10*3/uL (ref 0.0–0.4)
Eos: 3 %
Hematocrit: 33.5 % — ABNORMAL LOW (ref 37.5–51.0)
Hemoglobin: 11 g/dL — ABNORMAL LOW (ref 13.0–17.7)
Immature Grans (Abs): 0 10*3/uL (ref 0.0–0.1)
Immature Granulocytes: 1 %
Lymphocytes Absolute: 1.4 10*3/uL (ref 0.7–3.1)
Lymphs: 29 %
MCH: 28.5 pg (ref 26.6–33.0)
MCHC: 32.8 g/dL (ref 31.5–35.7)
MCV: 87 fL (ref 79–97)
MONOS ABS: 0.3 10*3/uL (ref 0.1–0.9)
Monocytes: 7 %
NEUTROS ABS: 2.9 10*3/uL (ref 1.4–7.0)
NEUTROS PCT: 59 %
Platelets: 265 10*3/uL (ref 150–379)
RBC: 3.86 x10E6/uL — ABNORMAL LOW (ref 4.14–5.80)
RDW: 15.8 % — AB (ref 12.3–15.4)
WBC: 4.8 10*3/uL (ref 3.4–10.8)

## 2017-11-22 LAB — CMP14+EGFR
A/G RATIO: 2.2 (ref 1.2–2.2)
ALT: 29 IU/L (ref 0–44)
AST: 28 IU/L (ref 0–40)
Albumin: 4.1 g/dL (ref 3.5–4.8)
Alkaline Phosphatase: 67 IU/L (ref 39–117)
BILIRUBIN TOTAL: 0.2 mg/dL (ref 0.0–1.2)
BUN/Creatinine Ratio: 12 (ref 10–24)
BUN: 10 mg/dL (ref 8–27)
CHLORIDE: 105 mmol/L (ref 96–106)
CO2: 23 mmol/L (ref 20–29)
Calcium: 9.8 mg/dL (ref 8.6–10.2)
Creatinine, Ser: 0.85 mg/dL (ref 0.76–1.27)
GFR, EST AFRICAN AMERICAN: 96 mL/min/{1.73_m2} (ref >59)
GFR, EST NON AFRICAN AMERICAN: 83 mL/min/{1.73_m2} (ref >59)
GLOBULIN, TOTAL: 1.9 g/dL (ref 1.5–4.5)
Glucose: 149 mg/dL — ABNORMAL HIGH (ref 65–99)
POTASSIUM: 4.2 mmol/L (ref 3.5–5.2)
SODIUM: 144 mmol/L (ref 134–144)
Total Protein: 6 g/dL (ref 6.0–8.5)

## 2017-11-22 LAB — LIPID PANEL
Chol/HDL Ratio: 3.9 ratio (ref 0.0–5.0)
Cholesterol, Total: 118 mg/dL (ref 100–199)
HDL: 30 mg/dL — ABNORMAL LOW (ref >39)
LDL Calculated: 46 mg/dL (ref 0–99)
Triglycerides: 209 mg/dL — ABNORMAL HIGH (ref 0–149)
VLDL Cholesterol Cal: 42 mg/dL — ABNORMAL HIGH (ref 5–40)

## 2017-11-22 MED ORDER — PREDNISONE 20 MG PO TABS: ORAL_TABLET | ORAL | 0 refills | Status: DC

## 2017-11-22 MED ORDER — ATORVASTATIN CALCIUM 40 MG PO TABS: 40.0000 mg | ORAL_TABLET | Freq: Every day | ORAL | 0 refills | Status: DC

## 2017-11-22 NOTE — Telephone Encounter (Signed)
lmtcb

## 2017-11-22 NOTE — Telephone Encounter (Signed)
Pt's xr is negative, still has foot pain.   I would consider gout, I have added uric acid to labs and sent a short course of prednisone to his pharmacy.   Laroy Apple, MD [facility] 11/22/2017, 7:43 AM

## 2017-11-22 NOTE — Telephone Encounter (Signed)
Refer to lab note °

## 2017-11-22 NOTE — Telephone Encounter (Signed)
Patient aware and verbalizes understanding. 

## 2017-11-28 ENCOUNTER — Other Ambulatory Visit: Payer: Self-pay | Admitting: Family Medicine

## 2017-11-30 ENCOUNTER — Other Ambulatory Visit: Payer: Self-pay | Admitting: *Deleted

## 2017-11-30 MED ORDER — ATORVASTATIN CALCIUM 40 MG PO TABS: 40.0000 mg | ORAL_TABLET | Freq: Every day | ORAL | 1 refills | Status: AC

## 2017-12-01 ENCOUNTER — Telehealth: Payer: Self-pay | Admitting: Family Medicine

## 2017-12-01 MED ORDER — COLCHICINE 0.6 MG PO TABS: ORAL_TABLET | ORAL | 0 refills | Status: DC

## 2017-12-01 NOTE — Telephone Encounter (Signed)
I do not recommend allopurinol to treat acute flares of gout, however it can be a very useful tool for prophylaxis.  Given colchicine.  Recommend read carefully instructions.  Take 2 tablets at once followed by 1 tablet 1 hour later, then 1 tablet daily for 3 more days.  Laroy Apple, MD [facility] 12/01/2017, 5:25 PM

## 2017-12-01 NOTE — Telephone Encounter (Signed)
Aware.Please review and advise.

## 2017-12-02 ENCOUNTER — Telehealth: Payer: Self-pay | Admitting: Family Medicine

## 2017-12-02 NOTE — Telephone Encounter (Signed)
Patient aware and verbalizes understanding. 

## 2017-12-07 ENCOUNTER — Telehealth: Payer: Self-pay | Admitting: Family Medicine

## 2017-12-07 ENCOUNTER — Encounter: Payer: Self-pay | Admitting: Family Medicine

## 2017-12-07 ENCOUNTER — Ambulatory Visit (INDEPENDENT_AMBULATORY_CARE_PROVIDER_SITE_OTHER): Payer: Medicare HMO | Admitting: Family Medicine

## 2017-12-07 VITALS — BP 100/60 | HR 94 | Temp 98.4°F | Ht 73.0 in | Wt 205.0 lb

## 2017-12-07 DIAGNOSIS — M79672 Pain in left foot: Secondary | ICD-10-CM

## 2017-12-07 MED ORDER — COLCHICINE 0.6 MG PO TABS: 0.6000 mg | ORAL_TABLET | Freq: Every day | ORAL | 0 refills | Status: AC

## 2017-12-07 NOTE — Patient Instructions (Signed)
I have refilled the colchicine.  You may take 1 tablet every day until your pain has gone away in your foot.  Continue taking it for 2 more days after the pain has gone away and then stop.   Gout Gout is painful swelling that can happen in some of your joints. Gout is a type of arthritis. This condition is caused by having too much uric acid in your body. Uric acid is a chemical that is made when your body breaks down substances called purines. If your body has too much uric acid, sharp crystals can form and build up in your joints. This causes pain and swelling. Gout attacks can happen quickly and be very painful (acute gout). Over time, the attacks can affect more joints and happen more often (chronic gout). Follow these instructions at home: During a Gout Attack  If directed, put ice on the painful area: ? Put ice in a plastic bag. ? Place a towel between your skin and the bag. ? Leave the ice on for 20 minutes, 2-3 times a day.  Rest the joint as much as possible. If the joint is in your leg, you may be given crutches to use.  Raise (elevate) the painful joint above the level of your heart as often as you can.  Drink enough fluids to keep your pee (urine) clear or pale yellow.  Take over-the-counter and prescription medicines only as told by your doctor.  Do not drive or use heavy machinery while taking prescription pain medicine.  Follow instructions from your doctor about what you can or cannot eat and drink.  Return to your normal activities as told by your doctor. Ask your doctor what activities are safe for you. Avoiding Future Gout Attacks  Follow a low-purine diet as told by a specialist (dietitian) or your doctor. Avoid foods and drinks that have a lot of purines, such as: ? Liver. ? Kidney. ? Anchovies. ? Asparagus. ? Herring. ? Mushrooms ? Mussels. ? Beer.  Limit alcohol intake to no more than 1 drink a day for nonpregnant women and 2 drinks a day for men. One  drink equals 12 oz of beer, 5 oz of wine, or 1 oz of hard liquor.  Stay at a healthy weight or lose weight if you are overweight. If you want to lose weight, talk with your doctor. It is important that you do not lose weight too fast.  Start or continue an exercise plan as told by your doctor.  Drink enough fluids to keep your pee clear or pale yellow.  Take over-the-counter and prescription medicines only as told by your doctor.  Keep all follow-up visits as told by your doctor. This is important. Contact a doctor if:  You have another gout attack.  You still have symptoms of a gout attack after10 days of treatment.  You have problems (side effects) because of your medicines.  You have chills or a fever.  You have burning pain when you pee (urinate).  You have pain in your lower back or belly. Get help right away if:  You have very bad pain.  Your pain cannot be controlled.  You cannot pee. This information is not intended to replace advice given to you by your health care provider. Make sure you discuss any questions you have with your health care provider. Document Released: 06/01/2008 Document Revised: 01/29/2016 Document Reviewed: 06/05/2015 Elsevier Interactive Patient Education  [employer].

## 2017-12-07 NOTE — Telephone Encounter (Signed)
Apt made
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
## 2017-12-09 ENCOUNTER — Other Ambulatory Visit: Payer: Self-pay | Admitting: Family

## 2017-12-09 DIAGNOSIS — R609 Edema, unspecified: Secondary | ICD-10-CM

## 2017-12-16 LAB — GLUCOSE, POCT (MANUAL RESULT ENTRY): POC Glucose: 212 mg/dl — AB (ref 70–99)

## 2017-12-19 ENCOUNTER — Other Ambulatory Visit: Payer: Self-pay | Admitting: *Deleted

## 2017-12-19 MED ORDER — GABAPENTIN 400 MG PO CAPS: 800.0000 mg | ORAL_CAPSULE | Freq: Two times a day (BID) | ORAL | 0 refills | Status: DC

## 2017-12-22 ENCOUNTER — Encounter: Payer: Self-pay | Admitting: Family

## 2017-12-22 ENCOUNTER — Ambulatory Visit (INDEPENDENT_AMBULATORY_CARE_PROVIDER_SITE_OTHER): Payer: Medicare HMO | Admitting: Family

## 2017-12-22 DIAGNOSIS — M79672 Pain in left foot: Secondary | ICD-10-CM | POA: Diagnosis not present

## 2017-12-22 DIAGNOSIS — R609 Edema, unspecified: Secondary | ICD-10-CM

## 2017-12-22 NOTE — Patient Instructions (Signed)

## 2017-12-22 NOTE — Progress Notes (Addendum)
Subjective:    Patient ID: Keith Doyle, male    DOB: 1936-06-27, 82 y.o.   MRN: [id number]  HPI Pt presents to the office today with left foot swelling and tenderness. He states he has been to our several times and been prescribed colchicine for gout. States this helps temporarily, but the pain comes back.    He reports distal aching intermittent pain along the left foot of 9 out 10. He states the pain is improved when he is in his recliner, but worse when walking.   He has had negative x-ray 11/21/17, but did not get his Uric acid levels drawn. States he forgot to get lab work.   Review of Systems  Musculoskeletal: Positive for arthralgias.  All other systems reviewed and are negative.      Objective:   Physical Exam  Constitutional: He is oriented to person, place, and time. He appears well-developed and well-nourished. No distress.  HENT:  Head: Normocephalic.  Eyes: Pupils are equal, round, and reactive to light. Right eye exhibits no discharge. Left eye exhibits no discharge.  Neck: Normal range of motion. Neck supple. No thyromegaly present.  Cardiovascular: Normal rate, regular rhythm, normal heart sounds and intact distal pulses.  No murmur heard. Pulmonary/Chest: Effort normal and breath sounds normal. No respiratory distress. He has no wheezes.  Abdominal: Soft. Bowel sounds are normal. He exhibits no distension. There is no tenderness.  Musculoskeletal: Normal range of motion. He exhibits edema (3+ BLE) and tenderness (along left distal foot).  Neurological: He is alert and oriented to person, place, and time.  Skin: Skin is warm and dry. No rash noted. No erythema.  Psychiatric: He has a normal mood and affect. His behavior is normal. Judgment and thought content normal.  Vitals reviewed.   BP 134/82   Pulse 90   Temp 98 F (36.7 C) (Oral)   Ht 6' 1" (1.854 m)   Wt 210 lb 3.2 oz (95.3 kg)   BMI 27.73 kg/m      Assessment & Plan:  1. Left foot pain - Uric  acid - BMP8+EGFR  2. Peripheral edema Keep feet elevated when possible Compression hose Low salt diet Could be Norvasc?   Keep follow up with PCP - BMP8+EGFR - Compression stockings  I do not believe this pain is related to gout. Colchicine did not help, no erythemas or heat present at this time.   Evelina Dun, FNP

## 2017-12-23 LAB — BMP8+EGFR
BUN / CREAT RATIO: 9 — AB (ref 10–24)
BUN: 9 mg/dL (ref 8–27)
CHLORIDE: 105 mmol/L (ref 96–106)
CO2: 22 mmol/L (ref 20–29)
Calcium: 9.3 mg/dL (ref 8.6–10.2)
Creatinine, Ser: 0.97 mg/dL (ref 0.76–1.27)
GFR calc Af Amer: 85 mL/min/{1.73_m2} (ref >59)
GFR calc non Af Amer: 74 mL/min/{1.73_m2} (ref >59)
GLUCOSE: 121 mg/dL — AB (ref 65–99)
Potassium: 4.1 mmol/L (ref 3.5–5.2)
Sodium: 142 mmol/L (ref 134–144)

## 2017-12-23 LAB — GLUCOSE, POCT (MANUAL RESULT ENTRY): POC GLUCOSE: 150 mg/dL — AB (ref 70–99)

## 2017-12-23 LAB — URIC ACID: URIC ACID: 5.8 mg/dL (ref 3.7–8.6)

## 2018-01-16 ENCOUNTER — Other Ambulatory Visit: Payer: Self-pay | Admitting: Gastroenterology

## 2018-01-16 ENCOUNTER — Other Ambulatory Visit: Payer: Self-pay | Admitting: Family Medicine

## 2018-01-19 ENCOUNTER — Telehealth: Payer: Self-pay | Admitting: Family Medicine
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
## 2018-02-20 ENCOUNTER — Ambulatory Visit (INDEPENDENT_AMBULATORY_CARE_PROVIDER_SITE_OTHER): Payer: Medicare HMO | Admitting: Family Medicine

## 2018-02-20 ENCOUNTER — Encounter: Payer: Self-pay | Admitting: Family Medicine

## 2018-02-20 VITALS — BP 131/82 | HR 84 | Temp 97.6°F | Ht 73.0 in | Wt 200.0 lb

## 2018-02-20 DIAGNOSIS — I10 Essential (primary) hypertension: Secondary | ICD-10-CM

## 2018-02-20 DIAGNOSIS — I480 Paroxysmal atrial fibrillation: Secondary | ICD-10-CM

## 2018-02-20 DIAGNOSIS — I5032 Chronic diastolic (congestive) heart failure: Secondary | ICD-10-CM | POA: Diagnosis not present

## 2018-02-20 DIAGNOSIS — E1140 Type 2 diabetes mellitus with diabetic neuropathy, unspecified: Secondary | ICD-10-CM | POA: Diagnosis not present

## 2018-02-20 LAB — BAYER DCA HB A1C WAIVED: HB A1C: 6.5 % (ref <7.0)

## 2018-02-20 NOTE — Patient Instructions (Signed)
Continue your medications.  See me in 3 months.  Remo Lipps will call you about Dr Maureen Ralphs today for your knee injections.

## 2018-02-23 ENCOUNTER — Other Ambulatory Visit: Payer: Medicare HMO

## 2018-02-23 DIAGNOSIS — M1711 Unilateral primary osteoarthritis, right knee: Secondary | ICD-10-CM | POA: Diagnosis not present

## 2018-02-23 DIAGNOSIS — M1712 Unilateral primary osteoarthritis, left knee: Secondary | ICD-10-CM | POA: Diagnosis not present

## 2018-03-20 ENCOUNTER — Other Ambulatory Visit: Payer: Self-pay | Admitting: Family Medicine

## 2018-04-07 ENCOUNTER — Other Ambulatory Visit: Payer: Self-pay | Admitting: Family Medicine

## 2018-04-08 ENCOUNTER — Telehealth: Payer: Self-pay | Admitting: Family Medicine

## 2018-04-08 NOTE — Telephone Encounter (Signed)
Aware by VM that this med was sent to [facility] mail yesterday --- he is aware that if he needs this local = to call me back

## 2018-04-10 ENCOUNTER — Encounter: Payer: Self-pay | Admitting: Physician Assistant

## 2018-04-10 ENCOUNTER — Ambulatory Visit (INDEPENDENT_AMBULATORY_CARE_PROVIDER_SITE_OTHER): Payer: Medicare HMO | Admitting: Physician Assistant

## 2018-04-10 VITALS — BP 132/76 | HR 83 | Temp 98.3°F | Ht 73.0 in | Wt 192.6 lb

## 2018-04-10 DIAGNOSIS — E1140 Type 2 diabetes mellitus with diabetic neuropathy, unspecified: Secondary | ICD-10-CM

## 2018-04-10 LAB — GLUCOSE HEMOCUE WAIVED: GLU HEMOCUE WAIVED: 108 mg/dL — AB (ref 65–99)

## 2018-04-10 MED ORDER — METFORMIN HCL 500 MG PO TABS: 500.0000 mg | ORAL_TABLET | Freq: Every day | ORAL | 1 refills | Status: DC

## 2018-04-10 MED ORDER — GABAPENTIN 400 MG PO CAPS: ORAL_CAPSULE | ORAL | 0 refills | Status: AC

## 2018-04-10 NOTE — Telephone Encounter (Signed)
Refill sent during visit today
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
## 2018-05-01 ENCOUNTER — Other Ambulatory Visit: Payer: Self-pay

## 2018-05-01 MED ORDER — BENAZEPRIL HCL 40 MG PO TABS: 40.0000 mg | ORAL_TABLET | Freq: Two times a day (BID) | ORAL | 1 refills | Status: AC

## 2018-05-12 ENCOUNTER — Ambulatory Visit (INDEPENDENT_AMBULATORY_CARE_PROVIDER_SITE_OTHER): Payer: Medicare HMO | Admitting: Family Medicine

## 2018-05-12 ENCOUNTER — Encounter: Payer: Self-pay | Admitting: Family Medicine

## 2018-05-12 VITALS — BP 137/84 | HR 98 | Temp 97.6°F | Ht 72.0 in | Wt 206.0 lb

## 2018-05-12 DIAGNOSIS — E1140 Type 2 diabetes mellitus with diabetic neuropathy, unspecified: Secondary | ICD-10-CM

## 2018-05-12 DIAGNOSIS — R635 Abnormal weight gain: Secondary | ICD-10-CM

## 2018-05-12 DIAGNOSIS — R600 Localized edema: Secondary | ICD-10-CM | POA: Diagnosis not present

## 2018-05-12 DIAGNOSIS — R609 Edema, unspecified: Secondary | ICD-10-CM

## 2018-05-12 LAB — BAYER DCA HB A1C WAIVED: HB A1C (BAYER DCA - WAIVED): 5.7 % (ref <7.0)

## 2018-05-12 NOTE — Patient Instructions (Signed)
   STOP the Metformin.   Make sure that you are taking your Furosemide TWO times a daily.  Check your weight daily.  You have gained 13 pounds since last visit.  DO NOT add salt to your food.  Salt is causing your legs to swell  See me in 1 week for weight check/ leg swelling.
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
## 2018-05-13 LAB — CBC WITH DIFFERENTIAL/PLATELET
BASOS: 1 %
Basophils Absolute: 0 10*3/uL (ref 0.0–0.2)
EOS (ABSOLUTE): 0.2 10*3/uL (ref 0.0–0.4)
Eos: 4 %
HEMOGLOBIN: 9.6 g/dL — AB (ref 13.0–17.7)
Hematocrit: 29.3 % — ABNORMAL LOW (ref 37.5–51.0)
Immature Grans (Abs): 0 10*3/uL (ref 0.0–0.1)
Immature Granulocytes: 0 %
LYMPHS ABS: 1.5 10*3/uL (ref 0.7–3.1)
Lymphs: 23 %
MCH: 28.3 pg (ref 26.6–33.0)
MCHC: 32.8 g/dL (ref 31.5–35.7)
MCV: 86 fL (ref 79–97)
Monocytes Absolute: 0.3 10*3/uL (ref 0.1–0.9)
Monocytes: 4 %
NEUTROS ABS: 4.5 10*3/uL (ref 1.4–7.0)
Neutrophils: 68 %
Platelets: 263 10*3/uL (ref 150–450)
RBC: 3.39 x10E6/uL — ABNORMAL LOW (ref 4.14–5.80)
RDW: 18.2 % — ABNORMAL HIGH (ref 12.3–15.4)
WBC: 6.6 10*3/uL (ref 3.4–10.8)

## 2018-05-13 LAB — CMP14+EGFR
A/G RATIO: 1.9 (ref 1.2–2.2)
ALK PHOS: 57 IU/L (ref 39–117)
ALT: 13 IU/L (ref 0–44)
AST: 14 IU/L (ref 0–40)
Albumin: 3.7 g/dL (ref 3.5–4.7)
BILIRUBIN TOTAL: 0.2 mg/dL (ref 0.0–1.2)
BUN / CREAT RATIO: 11 (ref 10–24)
BUN: 12 mg/dL (ref 8–27)
CHLORIDE: 104 mmol/L (ref 96–106)
CO2: 24 mmol/L (ref 20–29)
Calcium: 9.5 mg/dL (ref 8.6–10.2)
Creatinine, Ser: 1.07 mg/dL (ref 0.76–1.27)
GFR calc Af Amer: 75 mL/min/{1.73_m2} (ref >59)
GFR calc non Af Amer: 65 mL/min/{1.73_m2} (ref >59)
GLOBULIN, TOTAL: 2 g/dL (ref 1.5–4.5)
Glucose: 135 mg/dL — ABNORMAL HIGH (ref 65–99)
Potassium: 4.6 mmol/L (ref 3.5–5.2)
SODIUM: 144 mmol/L (ref 134–144)
Total Protein: 5.7 g/dL — ABNORMAL LOW (ref 6.0–8.5)

## 2018-05-13 LAB — TSH: TSH: 5.86 u[IU]/mL — AB (ref 0.450–4.500)

## 2018-05-19 ENCOUNTER — Ambulatory Visit (INDEPENDENT_AMBULATORY_CARE_PROVIDER_SITE_OTHER): Payer: Medicare HMO | Admitting: Family Medicine

## 2018-05-19 ENCOUNTER — Encounter: Payer: Self-pay | Admitting: Family Medicine

## 2018-05-19 VITALS — BP 152/92 | HR 72 | Temp 97.8°F | Ht 72.0 in | Wt 205.0 lb

## 2018-05-19 DIAGNOSIS — R7989 Other specified abnormal findings of blood chemistry: Secondary | ICD-10-CM | POA: Diagnosis not present

## 2018-05-19 DIAGNOSIS — R600 Localized edema: Secondary | ICD-10-CM

## 2018-05-19 DIAGNOSIS — I480 Paroxysmal atrial fibrillation: Secondary | ICD-10-CM

## 2018-05-19 DIAGNOSIS — I5032 Chronic diastolic (congestive) heart failure: Secondary | ICD-10-CM | POA: Diagnosis not present

## 2018-05-19 DIAGNOSIS — D649 Anemia, unspecified: Secondary | ICD-10-CM | POA: Diagnosis not present

## 2018-05-19 DIAGNOSIS — I10 Essential (primary) hypertension: Secondary | ICD-10-CM
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
## 2018-05-20 LAB — CBC WITH DIFFERENTIAL/PLATELET
BASOS ABS: 0.1 10*3/uL (ref 0.0–0.2)
Basos: 1 %
EOS (ABSOLUTE): 0.2 10*3/uL (ref 0.0–0.4)
Eos: 5 %
Hematocrit: 28 % — ABNORMAL LOW (ref 37.5–51.0)
Hemoglobin: 9 g/dL — ABNORMAL LOW (ref 13.0–17.7)
Immature Grans (Abs): 0 10*3/uL (ref 0.0–0.1)
Immature Granulocytes: 1 %
Lymphocytes Absolute: 1.4 10*3/uL (ref 0.7–3.1)
Lymphs: 31 %
MCH: 28 pg (ref 26.6–33.0)
MCHC: 32.1 g/dL (ref 31.5–35.7)
MCV: 87 fL (ref 79–97)
MONOCYTES: 6 %
MONOS ABS: 0.3 10*3/uL (ref 0.1–0.9)
Neutrophils Absolute: 2.4 10*3/uL (ref 1.4–7.0)
Neutrophils: 56 %
Platelets: 249 10*3/uL (ref 150–450)
RBC: 3.21 x10E6/uL — AB (ref 4.14–5.80)
RDW: 16.5 % — AB (ref 12.3–15.4)
WBC: 4.4 10*3/uL (ref 3.4–10.8)

## 2018-05-20 LAB — BASIC METABOLIC PANEL
BUN/Creatinine Ratio: 10 (ref 10–24)
BUN: 12 mg/dL (ref 8–27)
CO2: 23 mmol/L (ref 20–29)
CREATININE: 1.26 mg/dL (ref 0.76–1.27)
Calcium: 9.2 mg/dL (ref 8.6–10.2)
Chloride: 104 mmol/L (ref 96–106)
GFR calc Af Amer: 62 mL/min/{1.73_m2} (ref >59)
GFR calc non Af Amer: 54 mL/min/{1.73_m2} — ABNORMAL LOW (ref >59)
GLUCOSE: 99 mg/dL (ref 65–99)
POTASSIUM: 4.1 mmol/L (ref 3.5–5.2)
SODIUM: 143 mmol/L (ref 134–144)

## 2018-05-20 LAB — BRAIN NATRIURETIC PEPTIDE: BNP: 299.1 pg/mL — ABNORMAL HIGH (ref 0.0–100.0)

## 2018-05-23 ENCOUNTER — Other Ambulatory Visit: Payer: Self-pay

## 2018-05-23 ENCOUNTER — Emergency Department (HOSPITAL_COMMUNITY): Payer: Medicare HMO

## 2018-05-23 ENCOUNTER — Inpatient Hospital Stay (HOSPITAL_COMMUNITY)
Admission: EM | Admit: 2018-05-23 | Discharge: 2018-07-07 | DRG: 064 | Disposition: E | Payer: Medicare HMO | Attending: Internal Medicine | Admitting: Internal Medicine

## 2018-05-23 ENCOUNTER — Encounter (HOSPITAL_COMMUNITY): Payer: Self-pay

## 2018-05-23 DIAGNOSIS — I5032 Chronic diastolic (congestive) heart failure: Secondary | ICD-10-CM | POA: Diagnosis not present

## 2018-05-23 DIAGNOSIS — I6340 Cerebral infarction due to embolism of unspecified cerebral artery: Secondary | ICD-10-CM | POA: Diagnosis not present

## 2018-05-23 DIAGNOSIS — R609 Edema, unspecified: Secondary | ICD-10-CM

## 2018-05-23 DIAGNOSIS — J9601 Acute respiratory failure with hypoxia: Secondary | ICD-10-CM | POA: Diagnosis not present

## 2018-05-23 DIAGNOSIS — I10 Essential (primary) hypertension: Secondary | ICD-10-CM | POA: Diagnosis not present

## 2018-05-23 DIAGNOSIS — Z515 Encounter for palliative care: Secondary | ICD-10-CM | POA: Diagnosis not present

## 2018-05-23 DIAGNOSIS — R4182 Altered mental status, unspecified: Secondary | ICD-10-CM | POA: Diagnosis not present

## 2018-05-23 DIAGNOSIS — N400 Enlarged prostate without lower urinary tract symptoms: Secondary | ICD-10-CM | POA: Diagnosis present

## 2018-05-23 DIAGNOSIS — Z95828 Presence of other vascular implants and grafts: Secondary | ICD-10-CM

## 2018-05-23 DIAGNOSIS — I69952 Hemiplegia and hemiparesis following unspecified cerebrovascular disease affecting left dominant side: Secondary | ICD-10-CM | POA: Diagnosis not present

## 2018-05-23 DIAGNOSIS — Z8711 Personal history of peptic ulcer disease: Secondary | ICD-10-CM

## 2018-05-23 DIAGNOSIS — Z820 Family history of epilepsy and other diseases of the nervous system: Secondary | ICD-10-CM

## 2018-05-23 DIAGNOSIS — Z66 Do not resuscitate: Secondary | ICD-10-CM | POA: Diagnosis present

## 2018-05-23 DIAGNOSIS — Z7901 Long term (current) use of anticoagulants: Secondary | ICD-10-CM

## 2018-05-23 DIAGNOSIS — I110 Hypertensive heart disease with heart failure: Secondary | ICD-10-CM | POA: Diagnosis present

## 2018-05-23 DIAGNOSIS — J690 Pneumonitis due to inhalation of food and vomit: Secondary | ICD-10-CM | POA: Diagnosis not present

## 2018-05-23 DIAGNOSIS — D500 Iron deficiency anemia secondary to blood loss (chronic): Secondary | ICD-10-CM | POA: Diagnosis present

## 2018-05-23 DIAGNOSIS — R001 Bradycardia, unspecified: Secondary | ICD-10-CM | POA: Diagnosis not present

## 2018-05-23 DIAGNOSIS — K449 Diaphragmatic hernia without obstruction or gangrene: Secondary | ICD-10-CM | POA: Diagnosis present

## 2018-05-23 DIAGNOSIS — J9811 Atelectasis: Secondary | ICD-10-CM | POA: Diagnosis present

## 2018-05-23 DIAGNOSIS — G629 Polyneuropathy, unspecified: Secondary | ICD-10-CM

## 2018-05-23 DIAGNOSIS — I4891 Unspecified atrial fibrillation: Secondary | ICD-10-CM | POA: Diagnosis not present

## 2018-05-23 DIAGNOSIS — Z79899 Other long term (current) drug therapy: Secondary | ICD-10-CM

## 2018-05-23 DIAGNOSIS — J9690 Respiratory failure, unspecified, unspecified whether with hypoxia or hypercapnia: Secondary | ICD-10-CM

## 2018-05-23 DIAGNOSIS — J9600 Acute respiratory failure, unspecified whether with hypoxia or hypercapnia: Secondary | ICD-10-CM | POA: Diagnosis present

## 2018-05-23 DIAGNOSIS — L89151 Pressure ulcer of sacral region, stage 1: Secondary | ICD-10-CM | POA: Diagnosis present

## 2018-05-23 DIAGNOSIS — Z4682 Encounter for fitting and adjustment of non-vascular catheter: Secondary | ICD-10-CM | POA: Diagnosis not present

## 2018-05-23 DIAGNOSIS — Z4659 Encounter for fitting and adjustment of other gastrointestinal appliance and device: Secondary | ICD-10-CM

## 2018-05-23 DIAGNOSIS — G9349 Other encephalopathy: Secondary | ICD-10-CM | POA: Diagnosis not present

## 2018-05-23 DIAGNOSIS — R0902 Hypoxemia: Secondary | ICD-10-CM | POA: Diagnosis not present

## 2018-05-23 DIAGNOSIS — I480 Paroxysmal atrial fibrillation: Secondary | ICD-10-CM | POA: Diagnosis not present

## 2018-05-23 DIAGNOSIS — L8990 Pressure ulcer of unspecified site, unspecified stage: Secondary | ICD-10-CM

## 2018-05-23 DIAGNOSIS — H50111 Monocular exotropia, right eye: Secondary | ICD-10-CM | POA: Diagnosis present

## 2018-05-23 DIAGNOSIS — E870 Hyperosmolality and hypernatremia: Secondary | ICD-10-CM | POA: Diagnosis present

## 2018-05-23 DIAGNOSIS — R531 Weakness: Secondary | ICD-10-CM | POA: Diagnosis not present

## 2018-05-23 DIAGNOSIS — Z86718 Personal history of other venous thrombosis and embolism: Secondary | ICD-10-CM

## 2018-05-23 DIAGNOSIS — N179 Acute kidney failure, unspecified: Secondary | ICD-10-CM | POA: Diagnosis not present

## 2018-05-23 DIAGNOSIS — E1142 Type 2 diabetes mellitus with diabetic polyneuropathy: Secondary | ICD-10-CM | POA: Diagnosis present

## 2018-05-23 DIAGNOSIS — R600 Localized edema: Secondary | ICD-10-CM | POA: Diagnosis not present

## 2018-05-23 DIAGNOSIS — Z452 Encounter for adjustment and management of vascular access device: Secondary | ICD-10-CM | POA: Diagnosis not present

## 2018-05-23 DIAGNOSIS — E43 Unspecified severe protein-calorie malnutrition: Secondary | ICD-10-CM | POA: Diagnosis not present

## 2018-05-23 DIAGNOSIS — I639 Cerebral infarction, unspecified: Secondary | ICD-10-CM | POA: Diagnosis present

## 2018-05-23 DIAGNOSIS — E1165 Type 2 diabetes mellitus with hyperglycemia: Secondary | ICD-10-CM | POA: Diagnosis present

## 2018-05-23 DIAGNOSIS — D329 Benign neoplasm of meninges, unspecified: Secondary | ICD-10-CM | POA: Diagnosis present

## 2018-05-23 DIAGNOSIS — Z6824 Body mass index (BMI) 24.0-24.9, adult: Secondary | ICD-10-CM

## 2018-05-23 DIAGNOSIS — E1140 Type 2 diabetes mellitus with diabetic neuropathy, unspecified: Secondary | ICD-10-CM | POA: Diagnosis not present

## 2018-05-23 DIAGNOSIS — Z825 Family history of asthma and other chronic lower respiratory diseases: Secondary | ICD-10-CM

## 2018-05-23 DIAGNOSIS — E440 Moderate protein-calorie malnutrition: Secondary | ICD-10-CM

## 2018-05-23 DIAGNOSIS — R739 Hyperglycemia, unspecified: Secondary | ICD-10-CM | POA: Diagnosis present

## 2018-05-23 DIAGNOSIS — Z8673 Personal history of transient ischemic attack (TIA), and cerebral infarction without residual deficits: Secondary | ICD-10-CM

## 2018-05-23 DIAGNOSIS — E785 Hyperlipidemia, unspecified: Secondary | ICD-10-CM | POA: Diagnosis present

## 2018-05-23 DIAGNOSIS — Z809 Family history of malignant neoplasm, unspecified: Secondary | ICD-10-CM

## 2018-05-23 DIAGNOSIS — E876 Hypokalemia: Secondary | ICD-10-CM | POA: Diagnosis present

## 2018-05-23 DIAGNOSIS — Z9079 Acquired absence of other genital organ(s): Secondary | ICD-10-CM

## 2018-05-23 DIAGNOSIS — R509 Fever, unspecified: Secondary | ICD-10-CM | POA: Diagnosis not present

## 2018-05-23 DIAGNOSIS — R569 Unspecified convulsions: Secondary | ICD-10-CM | POA: Diagnosis not present

## 2018-05-23 DIAGNOSIS — R451 Restlessness and agitation: Secondary | ICD-10-CM | POA: Diagnosis present

## 2018-05-23 DIAGNOSIS — Z823 Family history of stroke: Secondary | ICD-10-CM

## 2018-05-23 DIAGNOSIS — Z7189 Other specified counseling: Secondary | ICD-10-CM | POA: Diagnosis not present

## 2018-05-23 DIAGNOSIS — Z86010 Personal history of colonic polyps: Secondary | ICD-10-CM

## 2018-05-23 DIAGNOSIS — Z79890 Hormone replacement therapy (postmenopausal): Secondary | ICD-10-CM

## 2018-05-23 DIAGNOSIS — I6523 Occlusion and stenosis of bilateral carotid arteries: Secondary | ICD-10-CM | POA: Diagnosis not present

## 2018-05-23 DIAGNOSIS — R4020 Unspecified coma: Secondary | ICD-10-CM | POA: Diagnosis not present

## 2018-05-23 DIAGNOSIS — G8194 Hemiplegia, unspecified affecting left nondominant side: Secondary | ICD-10-CM | POA: Diagnosis present

## 2018-05-23 DIAGNOSIS — Z833 Family history of diabetes mellitus: Secondary | ICD-10-CM

## 2018-05-23 DIAGNOSIS — E7800 Pure hypercholesterolemia, unspecified: Secondary | ICD-10-CM | POA: Diagnosis not present

## 2018-05-23 LAB — URINALYSIS, ROUTINE W REFLEX MICROSCOPIC
Bilirubin Urine: NEGATIVE
Glucose, UA: NEGATIVE mg/dL
HGB URINE DIPSTICK: NEGATIVE
KETONES UR: NEGATIVE mg/dL
LEUKOCYTES UA: NEGATIVE
Nitrite: NEGATIVE
PROTEIN: NEGATIVE mg/dL
Specific Gravity, Urine: 1.005 (ref 1.005–1.030)
pH: 6 (ref 5.0–8.0)

## 2018-05-23 LAB — CBC WITH DIFFERENTIAL/PLATELET
Basophils Absolute: 0 10*3/uL (ref 0.0–0.1)
Basophils Relative: 1 %
Eosinophils Absolute: 0.2 10*3/uL (ref 0.0–0.7)
Eosinophils Relative: 3 %
HCT: 33.9 % — ABNORMAL LOW (ref 39.0–52.0)
Hemoglobin: 10.5 g/dL — ABNORMAL LOW (ref 13.0–17.0)
LYMPHS ABS: 1.6 10*3/uL (ref 0.7–4.0)
LYMPHS PCT: 24 %
MCH: 28.3 pg (ref 26.0–34.0)
MCHC: 31 g/dL (ref 30.0–36.0)
MCV: 91.4 fL (ref 78.0–100.0)
MONO ABS: 0.4 10*3/uL (ref 0.1–1.0)
MONOS PCT: 7 %
NEUTROS ABS: 4.3 10*3/uL (ref 1.7–7.7)
Neutrophils Relative %: 65 %
Platelets: 260 10*3/uL (ref 150–400)
RBC: 3.71 MIL/uL — ABNORMAL LOW (ref 4.22–5.81)
RDW: 17.1 % — AB (ref 11.5–15.5)
WBC: 6.5 10*3/uL (ref 4.0–10.5)

## 2018-05-23 LAB — COMPREHENSIVE METABOLIC PANEL
ALT: 17 U/L (ref 0–44)
ANION GAP: 7 (ref 5–15)
AST: 18 U/L (ref 15–41)
Albumin: 4 g/dL (ref 3.5–5.0)
Alkaline Phosphatase: 51 U/L (ref 38–126)
BUN: 15 mg/dL (ref 8–23)
CALCIUM: 9.6 mg/dL (ref 8.9–10.3)
CO2: 26 mmol/L (ref 22–32)
Chloride: 108 mmol/L (ref 98–111)
Creatinine, Ser: 1.11 mg/dL (ref 0.61–1.24)
GFR calc Af Amer: >60 mL/min (ref >60)
Glucose, Bld: 111 mg/dL — ABNORMAL HIGH (ref 70–99)
POTASSIUM: 3.3 mmol/L — AB (ref 3.5–5.1)
Sodium: 141 mmol/L (ref 135–145)
TOTAL PROTEIN: 7.2 g/dL (ref 6.5–8.1)
Total Bilirubin: 0.5 mg/dL (ref 0.3–1.2)

## 2018-05-23 LAB — PROTIME-INR
INR: 1.1
Prothrombin Time: 14.1 seconds (ref 11.4–15.2)

## 2018-05-23 LAB — CBC
HCT: 33.6 % — ABNORMAL LOW (ref 39.0–52.0)
Hemoglobin: 10.2 g/dL — ABNORMAL LOW (ref 13.0–17.0)
MCH: 27.5 pg (ref 26.0–34.0)
MCHC: 30.4 g/dL (ref 30.0–36.0)
MCV: 90.6 fL (ref 78.0–100.0)
PLATELETS: 250 10*3/uL (ref 150–400)
RBC: 3.71 MIL/uL — AB (ref 4.22–5.81)
RDW: 17.2 % — ABNORMAL HIGH (ref 11.5–15.5)
WBC: 6.3 10*3/uL (ref 4.0–10.5)

## 2018-05-23 LAB — TROPONIN I

## 2018-05-23 LAB — BRAIN NATRIURETIC PEPTIDE: B Natriuretic Peptide: 227 pg/mL — ABNORMAL HIGH (ref 0.0–100.0)

## 2018-05-23 LAB — TSH: TSH: 3.667 u[IU]/mL (ref 0.350–4.500)

## 2018-05-23 MED ORDER — RIVAROXABAN 15 MG PO TABS
15.0000 mg | ORAL_TABLET | Freq: Every day | ORAL | Status: DC
  Filled 2018-05-23: qty 1

## 2018-05-23 MED ORDER — FENOFIBRATE 160 MG PO TABS
160.0000 mg | ORAL_TABLET | Freq: Every day | ORAL | Status: DC
  Filled 2018-05-23: qty 1

## 2018-05-23 MED ORDER — ONDANSETRON HCL 4 MG/2ML IJ SOLN: 4.0000 mg | Freq: Four times a day (QID) | INTRAMUSCULAR | Status: DC | PRN

## 2018-05-23 MED ORDER — SODIUM CHLORIDE 0.9 % IV SOLN
250.0000 mL | INTRAVENOUS | Status: DC | PRN
  Administered 2018-05-24 – 2018-05-31 (×2): 250 mL via INTRAVENOUS

## 2018-05-23 MED ORDER — SODIUM CHLORIDE 0.9% FLUSH
3.0000 mL | Freq: Two times a day (BID) | INTRAVENOUS | Status: DC
  Administered 2018-05-23 – 2018-06-06 (×22): 3 mL via INTRAVENOUS

## 2018-05-23 MED ORDER — ONDANSETRON HCL 4 MG PO TABS: 4.0000 mg | ORAL_TABLET | Freq: Four times a day (QID) | ORAL | Status: DC | PRN

## 2018-05-23 MED ORDER — ATORVASTATIN CALCIUM 40 MG PO TABS: 40.0000 mg | ORAL_TABLET | Freq: Every day | ORAL | Status: DC

## 2018-05-23 MED ORDER — POTASSIUM CHLORIDE 10 MEQ/100ML IV SOLN
10.0000 meq | INTRAVENOUS | Status: AC
  Administered 2018-05-23 – 2018-05-24 (×4): 10 meq via INTRAVENOUS
  Filled 2018-05-23 (×4): qty 100

## 2018-05-23 MED ORDER — SODIUM CHLORIDE 0.9% FLUSH: 3.0000 mL | INTRAVENOUS | Status: DC | PRN

## 2018-05-23 MED ORDER — FERROUS SULFATE 325 (65 FE) MG PO TABS: 325.0000 mg | ORAL_TABLET | Freq: Three times a day (TID) | ORAL | Status: DC

## 2018-05-23 MED ORDER — POTASSIUM CHLORIDE CRYS ER 20 MEQ PO TBCR: 40.0000 meq | EXTENDED_RELEASE_TABLET | Freq: Two times a day (BID) | ORAL | Status: DC

## 2018-05-23 MED ORDER — FUROSEMIDE 10 MG/ML IJ SOLN
40.0000 mg | Freq: Two times a day (BID) | INTRAMUSCULAR | Status: DC
  Administered 2018-05-23 – 2018-05-25 (×4): 40 mg via INTRAVENOUS
  Filled 2018-05-23 (×4): qty 4

## 2018-05-23 MED ORDER — STROKE: EARLY STAGES OF RECOVERY BOOK
Freq: Once | Status: AC
  Administered 2018-05-24: 16:00:00
  Filled 2018-05-23 (×2): qty 1

## 2018-05-23 MED ORDER — HEPARIN (PORCINE) IN NACL 100-0.45 UNIT/ML-% IJ SOLN
1400.0000 [IU]/h | INTRAMUSCULAR | Status: DC
  Administered 2018-05-23: 1000 [IU]/h via INTRAVENOUS
  Administered 2018-05-24: 1200 [IU]/h via INTRAVENOUS
  Administered 2018-05-25: 1300 [IU]/h via INTRAVENOUS
  Filled 2018-05-23 (×2): qty 250

## 2018-05-23 MED ORDER — ACETAMINOPHEN 650 MG RE SUPP
650.0000 mg | Freq: Four times a day (QID) | RECTAL | Status: DC | PRN
  Administered 2018-05-25 – 2018-06-05 (×13): 650 mg via RECTAL
  Filled 2018-05-23 (×13): qty 1

## 2018-05-23 MED ORDER — ACETAMINOPHEN 325 MG PO TABS
650.0000 mg | ORAL_TABLET | Freq: Four times a day (QID) | ORAL | Status: DC | PRN
  Administered 2018-05-24: 650 mg via ORAL
  Filled 2018-05-23: qty 2
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
## 2018-05-23 NOTE — Progress Notes (Signed)
Patient has failed swallow test at bedside.  Withheld all oral medications including Xarelto and have initiated heparin infusion instead.
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
## 2018-05-23 NOTE — ED Triage Notes (Signed)
Pt brought in by EMS due to altered mental status and edema in lower ext. Family reported to EMS that pt is more lethargic today and is having difficulty walking due to edema. Reports that pt usually able to ambulate without difficulty. Also reported pt was suppose to start lasix today

## 2018-05-24 ENCOUNTER — Inpatient Hospital Stay (HOSPITAL_COMMUNITY): Payer: Medicare HMO

## 2018-05-24 DIAGNOSIS — J9600 Acute respiratory failure, unspecified whether with hypoxia or hypercapnia: Secondary | ICD-10-CM | POA: Diagnosis present

## 2018-05-24 DIAGNOSIS — R4182 Altered mental status, unspecified: Secondary | ICD-10-CM | POA: Diagnosis not present

## 2018-05-24 DIAGNOSIS — I480 Paroxysmal atrial fibrillation: Secondary | ICD-10-CM

## 2018-05-24 DIAGNOSIS — I10 Essential (primary) hypertension: Secondary | ICD-10-CM

## 2018-05-24 LAB — BLOOD GAS, ARTERIAL
ACID-BASE EXCESS: 2.8 mmol/L — AB (ref 0.0–2.0)
ACID-BASE EXCESS: 2.4 mmol/L — AB (ref 0.0–2.0)
BICARBONATE: 26.9 mmol/L (ref 20.0–28.0)
DRAWN BY: 28459
Drawn by: 28459
FIO2: 100
FIO2: 28
LHR: 18 {breaths}/min
MECHVT: 620 mL
O2 Saturation: 94.4 %
PATIENT TEMPERATURE: 38.3
PCO2 ART: 35.9 mmHg (ref 32.0–48.0)
PEEP/CPAP: 5 cmH2O
PEEP: 5 cmH2O
PH ART: 7.477 — AB (ref 7.350–7.450)
PO2 ART: 72.3 mmHg — AB (ref 83.0–108.0)
Patient temperature: 38
RATE: 18 resp/min
VT: 620 mL
pCO2 arterial: 34.1 mmHg (ref 32.0–48.0)
pH, Arterial: 7.488 — ABNORMAL HIGH (ref 7.350–7.450)
pO2, Arterial: 346 mmHg — ABNORMAL HIGH (ref 83.0–108.0)

## 2018-05-24 LAB — BASIC METABOLIC PANEL
ANION GAP: 8 (ref 5–15)
BUN: 13 mg/dL (ref 8–23)
CALCIUM: 9.6 mg/dL (ref 8.9–10.3)
CO2: 27 mmol/L (ref 22–32)
CREATININE: 1.02 mg/dL (ref 0.61–1.24)
Chloride: 113 mmol/L — ABNORMAL HIGH (ref 98–111)
GFR calc Af Amer: >60 mL/min (ref >60)
GFR calc non Af Amer: >60 mL/min (ref >60)
GLUCOSE: 154 mg/dL — AB (ref 70–99)
POTASSIUM: 3.4 mmol/L — AB (ref 3.5–5.1)
Sodium: 148 mmol/L — ABNORMAL HIGH (ref 135–145)

## 2018-05-24 LAB — LIPID PANEL
Cholesterol: 151 mg/dL (ref 0–200)
HDL: 34 mg/dL — ABNORMAL LOW (ref >40)
LDL CALC: 87 mg/dL (ref 0–99)
TRIGLYCERIDES: 150 mg/dL — AB (ref <150)
Total CHOL/HDL Ratio: 4.4 RATIO
VLDL: 30 mg/dL (ref 0–40)

## 2018-05-24 LAB — ECHOCARDIOGRAM COMPLETE
Height: 72 in
Weight: 3001.78 oz

## 2018-05-24 LAB — HEPARIN LEVEL (UNFRACTIONATED)
HEPARIN UNFRACTIONATED: 0.29 [IU]/mL — AB (ref 0.30–0.70)
Heparin Unfractionated: 0.16 IU/mL — ABNORMAL LOW (ref 0.30–0.70)
Heparin Unfractionated: 0.22 IU/mL — ABNORMAL LOW (ref 0.30–0.70)
Heparin Unfractionated: <0.1 IU/mL — ABNORMAL LOW (ref 0.30–0.70)

## 2018-05-24 LAB — MAGNESIUM: Magnesium: 2 mg/dL (ref 1.7–2.4)

## 2018-05-24 LAB — LACTIC ACID, PLASMA
Lactic Acid, Venous: 2.1 mmol/L (ref 0.5–1.9)
Lactic Acid, Venous: 1.5 mmol/L (ref 0.5–1.9)

## 2018-05-24 LAB — AMMONIA: AMMONIA: 33 umol/L (ref 9–35)

## 2018-05-24 LAB — APTT
aPTT: 31 seconds (ref 24–36)
aPTT: 33 seconds (ref 24–36)

## 2018-05-24 MED ORDER — METOPROLOL TARTRATE 5 MG/5ML IV SOLN
5.0000 mg | Freq: Four times a day (QID) | INTRAVENOUS | Status: DC
  Administered 2018-05-24 – 2018-06-05 (×49): 5 mg via INTRAVENOUS
  Filled 2018-05-24 (×47): qty 5

## 2018-05-24 MED ORDER — ORAL CARE MOUTH RINSE
15.0000 mL | OROMUCOSAL | Status: DC
  Administered 2018-05-24 – 2018-06-06 (×116): 15 mL via OROMUCOSAL

## 2018-05-24 MED ORDER — CHLORHEXIDINE GLUCONATE 0.12% ORAL RINSE (MEDLINE KIT)
15.0000 mL | Freq: Two times a day (BID) | OROMUCOSAL | Status: DC
  Administered 2018-05-24 – 2018-06-05 (×23): 15 mL via OROMUCOSAL

## 2018-05-24 MED ORDER — PIPERACILLIN-TAZOBACTAM 3.375 G IVPB
3.3750 g | Freq: Three times a day (TID) | INTRAVENOUS | Status: DC
  Administered 2018-05-24 – 2018-05-25 (×2): 3.375 g via INTRAVENOUS
  Filled 2018-05-24: qty 50

## 2018-05-24 MED ORDER — ASPIRIN 325 MG PO TABS
325.0000 mg | ORAL_TABLET | Freq: Every day | ORAL | Status: DC
  Administered 2018-05-24: 325 mg via ORAL
  Filled 2018-05-24: qty 1

## 2018-05-24 MED ORDER — KETOROLAC TROMETHAMINE 15 MG/ML IJ SOLN
15.0000 mg | Freq: Once | INTRAMUSCULAR | Status: AC
  Administered 2018-05-24: 15 mg via INTRAVENOUS
  Filled 2018-05-24: qty 1

## 2018-05-24 MED ORDER — MIDAZOLAM HCL 2 MG/2ML IJ SOLN
1.0000 mg | INTRAMUSCULAR | Status: DC | PRN
  Administered 2018-05-26 – 2018-06-02 (×5): 1 mg via INTRAVENOUS
  Filled 2018-05-24 (×5): qty 2

## 2018-05-24 MED ORDER — FAMOTIDINE IN NACL 20-0.9 MG/50ML-% IV SOLN
20.0000 mg | Freq: Two times a day (BID) | INTRAVENOUS | Status: DC
  Administered 2018-05-24 – 2018-05-26 (×4): 20 mg via INTRAVENOUS
  Filled 2018-05-24 (×4): qty 50

## 2018-05-24 MED ORDER — MIDAZOLAM HCL 2 MG/2ML IJ SOLN
1.0000 mg | INTRAMUSCULAR | Status: DC | PRN
  Administered 2018-05-26 – 2018-06-02 (×2): 1 mg via INTRAVENOUS
  Filled 2018-05-24 (×4): qty 2

## 2018-05-24 MED ORDER — DOCUSATE SODIUM 50 MG/5ML PO LIQD
100.0000 mg | Freq: Two times a day (BID) | ORAL | Status: DC | PRN
  Filled 2018-05-24: qty 10

## 2018-05-24 MED ORDER — FENTANYL CITRATE (PF) 100 MCG/2ML IJ SOLN
50.0000 ug | INTRAMUSCULAR | Status: DC | PRN
  Administered 2018-05-25 – 2018-06-03 (×17): 50 ug via INTRAVENOUS
  Filled 2018-05-24 (×13): qty 2

## 2018-05-24 MED ORDER — FENTANYL CITRATE (PF) 100 MCG/2ML IJ SOLN
50.0000 ug | INTRAMUSCULAR | Status: AC | PRN
  Administered 2018-05-26 – 2018-06-02 (×3): 50 ug via INTRAVENOUS
  Filled 2018-05-24 (×8): qty 2

## 2018-05-24 MED ORDER — ALBUTEROL SULFATE (2.5 MG/3ML) 0.083% IN NEBU: 2.5000 mg | INHALATION_SOLUTION | RESPIRATORY_TRACT | Status: DC | PRN

## 2018-05-24 MED ORDER — IPRATROPIUM-ALBUTEROL 0.5-2.5 (3) MG/3ML IN SOLN
3.0000 mL | Freq: Four times a day (QID) | RESPIRATORY_TRACT | Status: DC
  Administered 2018-05-24 – 2018-06-05 (×47): 3 mL via RESPIRATORY_TRACT
  Filled 2018-05-24 (×45): qty 3

## 2018-05-24 NOTE — Progress Notes (Signed)
[place] for Heparin Indication: atrial fibrillation and Hx DVT, New CVA?  No Known Allergies  Patient Measurements: Height: 6' (182.9 cm) Weight: 187 lb 9.8 oz (85.1 kg) IBW/kg (Calculated) : 77.6 HEPARIN DW (KG): 88   Vital Signs: Temp: 98.3 F (36.8 C) (09/18 0500) Temp Source: Oral (09/18 0500) BP: 156/83 (09/18 0500) Pulse Rate: 66 (09/18 0500)  Labs: Recent Labs    05/27/2018 1743 05/21/2018 2232 05/24/18 0555  HGB 10.5* 10.2*  --   HCT 33.9* 33.6*  --   PLT 260 250  --   APTT  --  31 33  LABPROT 14.1  --   --   INR 1.10  --   --   HEPARINUNFRC  --  0.16* 0.22*  CREATININE 1.11  --  1.02  TROPONINI <0.03  --   --    Estimated Creatinine Clearance: 63.4 mL/min (by C-G formula based on SCr of 1.02 mg/dL).     Assessment: 82 yo man on xarelto PTA.  Baseline heparin level 0.16 units/ml and aPTT 31 sec.  Heparin level this am 0.22 units/ml.  Xarelto likely not having much effect on heparin level so will use heparin levels to guide therapy. Goal of Therapy:  Heparin level 0.3-0.7 units/ml  Monitor platelets by anticoagulation protocol: Yes   Plan:  Increase heparin drip to 1200 units/hr Check anti-Xa level and aPTT in 6-8 hours and daily while on heparin Continue to monitor H&H and platelets  Sonia Bromell Poteet 05/24/2018,6:59 AM
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
## 2018-05-24 NOTE — Progress Notes (Signed)
PROGRESS NOTE    Keith Doyle  DJM:[id number] DOB: 06-May-1936 DOA: 05/08/2018 PCP: Janora Norlander, DO    Brief Narrative:  82 y/o male admitted to the hospital with acute right basal ganglia CVA. Patient became increasingly lethargic and began having fevers. This was followed by development of respiratory distress and he became unresponsive. He was intubated for airway protection and respiratory distress. He was transferred to ICU. I suspect he may have aspirated. He was started on zosyn.   Assessment & Plan:   Principal Problem:   CVA (cerebral vascular accident) [facility]) Active Problems:   Essential hypertension   Peripheral neuropathy   Hyperlipidemia   Hypokalemia   Paroxysmal atrial fibrillation (HCC)   Chronic diastolic congestive heart failure (HCC)   Chronic anticoagulation   Iron deficiency anemia due to chronic blood loss   Acute respiratory failure ([place])   1. Acute CVA. Patient had presented with acute onset of LLE weakness. MRI brain confirms right basal ganglia infarct. MRA head shows some atherosclerotic disease on the left side. Carotid dopplers did not show any significant stenosis bilaterally. Echo did not show any significant findings. Seen by physical therapy who recommended SNF. He is chronically on xarelto, but this has been switched to heparin infusion since he has not passed swallow evaluation yet. Neurology consulted. 2. Acute respiratory failure with hypoxia. Likely related to aspiration pneumonia. Patient became unresponsive, febrile and developed respiratory distress rather quickly. He was intubated for airway protection and respiratory distress. Will continue on neb treatments. Post intubation chest xray in process. Will request pulmonology assistance. Start the patient on zosyn, check blood cultures. Check lactic acid. Will request PICC line placement, since patient may need vasoactive medications and multiple IVs. 3. Paroxysmal atrial fibrillation on  xarelto. Currently on heparin infusion. He did have some tachycardia earlier today due to pneumonia, likely precipitated by fever. This has resolved with IV lopressor. 4. HLD. Resume statin 5. Peripheral neuropathy. Gabapentin was placed on hold due to altered mental status. 6. Bilateral lower extremity edema. Mild elevation of BNP. Echo shows normal EF. Good urine output with lasix. Continue current treatments.   DVT prophylaxis: heparin infusion Code Status: full code Family Communication: discussed with multiple family members at the bedside Disposition Plan: transfer to ICU for further management   Consultants:   Neurology  Pulmonology  Procedures:  Echo: - Left ventricle: The cavity size was normal. Wall thickness was   increased increased in a pattern of mild to moderate LVH.   Systolic function was normal. The estimated ejection fraction was   in the range of 60% to 65%. Wall motion was normal; there were no   regional wall motion abnormalities. - Aortic valve: Mildly calcified annulus. Trileaflet. - Aortic root: The aortic root was mildly ectatic. - Mitral valve: There was trivial regurgitation. - Left atrium: The atrium was moderately dilated. - Right atrium: The atrium was mildly dilated. Central venous   pressure (est): 8 mm Hg. - Atrial septum: No defect or patent foramen ovale was identified. - Tricuspid valve: There was trivial regurgitation.  - Pericardium, extracardiac: There was no pericardial effusion.  Antimicrobials:      Subjective: Patient is unresponsive on my arrival. Increased respiratory rate and rhonchi. He was awake and trying to interact/follow commands earlier today   Objective: Vitals:   05/24/18 1730 05/24/18 1731 05/24/18 1752 05/24/18 1753  BP: 138/69   (!) 179/100  Pulse: 71 (!) 138 88 95  Resp: (!) 24   (!)  24  Temp: (!) 102.6 F (39.2 C)   (!) 101.8 F (38.8 C)  TempSrc: Axillary   Oral  SpO2: 92%   91%  Weight:      Height:         Intake/Output Summary (Last 24 hours) at 05/24/2018 1824 Last data filed at 05/24/2018 1500 Gross per 24 hour  Intake 490.34 ml  Output 3500 ml  Net -3009.66 ml   Filed Weights   05/20/2018 1722 06/05/2018 2130 05/24/18 0359  Weight: 93 kg 88 kg 85.1 kg    Examination:  General exam: unresponsive in respiratory distress Respiratory system: bilateral rhonchi with increased respiratory effort Cardiovascular system: S1 & S2 heard, RRR. No JVD, murmurs, rubs, gallops or clicks. 2+ pedal edema. Gastrointestinal system: Abdomen is nondistended, soft and nontender. No organomegaly or masses felt. Normal bowel sounds heard. Central nervous system: limited exam due to mental status. Extremities: bilateral pedal edema Skin: No rashes, lesions or ulcers Psychiatry: unresponsive    Data Reviewed: I have personally reviewed following labs and imaging studies  CBC: Recent Labs  Lab 05/19/18 1003 05/28/2018 1743 05/14/2018 2232  WBC 4.4 6.5 6.3  NEUTROABS 2.4 4.3  --   HGB 9.0* 10.5* 10.2*  HCT 28.0* 33.9* 33.6*  MCV 87 91.4 90.6  PLT 249 260 655   Basic Metabolic Panel: Recent Labs  Lab 05/19/18 1003 05/26/2018 1743 05/24/18 0555  NA 143 141 148*  K 4.1 3.3* 3.4*  CL 104 108 113*  CO2 '23 26 27  ' GLUCOSE 99 111* 154*  BUN '12 15 13  ' CREATININE 1.26 1.11 1.02  CALCIUM 9.2 9.6 9.6  MG  --   --  2.0   GFR: Estimated Creatinine Clearance: 63.4 mL/min (by C-G formula based on SCr of 1.02 mg/dL). Liver Function Tests: Recent Labs  Lab 05/18/2018 1743  AST 18  ALT 17  ALKPHOS 51  BILITOT 0.5  PROT 7.2  ALBUMIN 4.0   No results for input(s): LIPASE, AMYLASE in the last 168 hours. No results for input(s): AMMONIA in the last 168 hours. Coagulation Profile: Recent Labs  Lab 05/25/2018 1743  INR 1.10   Cardiac Enzymes: Recent Labs  Lab 05/27/2018 1743  TROPONINI <0.03   BNP (last 3 results) No results for input(s): PROBNP in the last 8760 hours. HbA1C: No results for  input(s): HGBA1C in the last 72 hours. CBG: No results for input(s): GLUCAP in the last 168 hours. Lipid Profile: Recent Labs    05/24/18 0555  CHOL 151  HDL 34*  LDLCALC 87  TRIG 150*  CHOLHDL 4.4   Thyroid Function Tests: Recent Labs    05/22/2018 1743  TSH 3.667   Anemia Panel: No results for input(s): VITAMINB12, FOLATE, FERRITIN, TIBC, IRON, RETICCTPCT in the last 72 hours. Sepsis Labs: No results for input(s): PROCALCITON, LATICACIDVEN in the last 168 hours.  No results found for this or any previous visit (from the past 240 hour(s)).       Radiology Studies: Dg Chest 2 View  Result Date: 05/20/2018 CLINICAL DATA:  Altered mental status EXAM: CHEST - 2 VIEW COMPARISON:  05/17/2017, CT 12/01/2010. FINDINGS: Suspected tiny left effusionSlightly enlarged cardiomediastinal silhouette with aortic atherosclerosis. No pneumothorax. Moderate to large hiatal hernia. Degenerative changes of the spine IMPRESSION: Mild cardiomegaly with suspected tiny left effusion. Moderate to large hiatal hernia. Electronically Signed   By: Donavan Foil M.D.   On: 05/19/2018 18:59   Ct Head Wo Contrast  Result Date: 05/28/2018 CLINICAL DATA:  Altered LOC EXAM: CT HEAD WITHOUT CONTRAST TECHNIQUE: Contiguous axial images were obtained from the base of the skull through the vertex without intravenous contrast. COMPARISON:  08/11/2013 head CT FINDINGS: Brain: No hemorrhage is visualized. Hypodensity within the right basal ganglia, new as compared with 2014 head CT, consistent with age indeterminate lacunar infarct. 5 mm partially calcified left frontal extra-axial mass likely a small meningioma. Stable calcified extra-axial 15 mm mass in the right middle cranial fossa consistent with meningioma. Moderate atrophy. Moderate small vessel ischemic changes of the white matter. Prominent ventricles are felt related to atrophy. Vascular: No hyperdense vessels. Vertebral and carotid vascular calcification Skull:  Normal. Negative for fracture or focal lesion. Sinuses/Orbits: No acute abnormality. Mucosal thickening in the ethmoid sinuses. Small osteoma left ethmoid sinus Other: None IMPRESSION: 1. Hypodensity within the right basal ganglia consistent with age indeterminate lacunar infarct, new since 2014 2. Atrophy with small vessel ischemic changes of the white matter 3. Stable right convexity partially calcified meningioma. Additional small left frontal convexity partially calcified extra-axial mass, likely small meningioma. Electronically Signed   By: Donavan Foil M.D.   On: 05/09/2018 19:05   Mr Jodene Nam Head Wo Contrast  Result Date: 05/24/2018 CLINICAL DATA:  Altered mental status and left lower extremity weakness. EXAM: MRI HEAD WITHOUT CONTRAST MRA HEAD WITHOUT CONTRAST TECHNIQUE: Multiplanar, multiecho pulse sequences of the brain and surrounding structures were obtained without intravenous contrast. Angiographic images of the head were obtained using MRA technique without contrast. COMPARISON:  Head CT 05/07/2018.  Head MRI/MRA 07/28/2010. FINDINGS: MRI HEAD FINDINGS Brain: There is a 3 cm acute infarct involving the right lentiform and caudate nuclei. No intracranial hemorrhage, midline shift, or extra-axial fluid collection is identified. Patchy cerebral white matter T2 hyperintensities have mildly progressed from 2011 and are nonspecific but compatible with moderate chronic small vessel ischemic disease. There is moderate cerebral atrophy. A 1.3 cm calcified extra-axial mass compatible with a meningioma at the level of the right sylvian fissure is unchanged from 2011 and does not result in significant mass effect or brain edema. 5 mm focus of extra-axial calcification over the left frontal convexity may also represent an incidental meningioma without mass effect or edema. Vascular: Major intracranial vascular flow voids are preserved. Skull and upper cervical spine: Unremarkable bone marrow signal.  Sinuses/Orbits: Bilateral cataract extraction. Trace bilateral mastoid effusions. Clear paranasal sinuses. Other: None. MRA HEAD FINDINGS The visualized distal vertebral arteries are patent to the basilar with mild distal V4 stenosis noted on the left. There is irregularity of the proximal right V4 segment without significant stenosis. Patent left PICA, bilateral AICA, and bilateral SCA origins are identified. The right PICA origin may be slightly proximal to the imaging volume. The basilar artery is widely patent. There is a small left posterior communicating artery. The PCAs are patent proximally with a new severe proximal left P1 stenosis. There is signal loss in the left greater than right P2 segments which is felt to be at least partly artifactual due to the course of the vessels with presumed artifactual left P2 signal loss also present on the prior MRA. No significant P1 or proximal P2 stenosis is evident on the right. The internal carotid arteries are widely patent from skull base to carotid termini. ACAs and MCAs are patent without evidence of proximal branch occlusion or flow limiting proximal stenosis. No aneurysm is identified. IMPRESSION: 1. Acute right basal ganglia infarct. 2. Moderate chronic small vessel ischemic disease and cerebral atrophy. 3. Incidental small meningiomas without  mass effect or edema. 4. Patent circle of Willis without large vessel occlusion. 5. New severe left P1 stenosis and mild distal left V4 stenosis. Electronically Signed   By: Logan Bores M.D.   On: 05/24/2018 10:28   Mr Brain Wo Contrast  Result Date: 05/24/2018 CLINICAL DATA:  Altered mental status and left lower extremity weakness. EXAM: MRI HEAD WITHOUT CONTRAST MRA HEAD WITHOUT CONTRAST TECHNIQUE: Multiplanar, multiecho pulse sequences of the brain and surrounding structures were obtained without intravenous contrast. Angiographic images of the head were obtained using MRA technique without contrast. COMPARISON:   Head CT 05/29/2018.  Head MRI/MRA 07/28/2010. FINDINGS: MRI HEAD FINDINGS Brain: There is a 3 cm acute infarct involving the right lentiform and caudate nuclei. No intracranial hemorrhage, midline shift, or extra-axial fluid collection is identified. Patchy cerebral white matter T2 hyperintensities have mildly progressed from 2011 and are nonspecific but compatible with moderate chronic small vessel ischemic disease. There is moderate cerebral atrophy. A 1.3 cm calcified extra-axial mass compatible with a meningioma at the level of the right sylvian fissure is unchanged from 2011 and does not result in significant mass effect or brain edema. 5 mm focus of extra-axial calcification over the left frontal convexity may also represent an incidental meningioma without mass effect or edema. Vascular: Major intracranial vascular flow voids are preserved. Skull and upper cervical spine: Unremarkable bone marrow signal. Sinuses/Orbits: Bilateral cataract extraction. Trace bilateral mastoid effusions. Clear paranasal sinuses. Other: None. MRA HEAD FINDINGS The visualized distal vertebral arteries are patent to the basilar with mild distal V4 stenosis noted on the left. There is irregularity of the proximal right V4 segment without significant stenosis. Patent left PICA, bilateral AICA, and bilateral SCA origins are identified. The right PICA origin may be slightly proximal to the imaging volume. The basilar artery is widely patent. There is a small left posterior communicating artery. The PCAs are patent proximally with a new severe proximal left P1 stenosis. There is signal loss in the left greater than right P2 segments which is felt to be at least partly artifactual due to the course of the vessels with presumed artifactual left P2 signal loss also present on the prior MRA. No significant P1 or proximal P2 stenosis is evident on the right. The internal carotid arteries are widely patent from skull base to carotid termini.  ACAs and MCAs are patent without evidence of proximal branch occlusion or flow limiting proximal stenosis. No aneurysm is identified. IMPRESSION: 1. Acute right basal ganglia infarct. 2. Moderate chronic small vessel ischemic disease and cerebral atrophy. 3. Incidental small meningiomas without mass effect or edema. 4. Patent circle of Willis without large vessel occlusion. 5. New severe left P1 stenosis and mild distal left V4 stenosis. Electronically Signed   By: Logan Bores M.D.   On: 05/24/2018 10:28   US Carotid Bilateral (at [facility] And [facility] Only)  Result Date: 05/24/2018 CLINICAL DATA:  82 year old male with a history weakness. Cardiovascular risk factors include hypertension, stroke/TIA, coronary disease, hyperlipidemia, diabetes EXAM: BILATERAL CAROTID DUPLEX ULTRASOUND TECHNIQUE: Pearline Cables scale imaging, color Doppler and duplex ultrasound were performed of bilateral carotid and vertebral arteries in the neck. COMPARISON:  None. FINDINGS: Criteria: Quantification of carotid stenosis is based on velocity parameters that correlate the residual internal carotid diameter with NASCET-based stenosis levels, using the diameter of the distal internal carotid lumen as the denominator for stenosis measurement. The following velocity measurements were obtained: RIGHT ICA:  Systolic 57 cm/sec, Diastolic 17 cm/sec CCA:  96 cm/sec SYSTOLIC  ICA/CCA RATIO:  0.6 ECA:  87 cm/sec LEFT ICA:  Systolic 60 cm/sec, Diastolic 16 cm/sec CCA:  94 cm/sec SYSTOLIC ICA/CCA RATIO:  0.6 ECA:  76 cm/sec Right Brachial SBP: Not acquired Left Brachial SBP: Not acquired RIGHT CAROTID ARTERY: No significant calcifications of the right common carotid artery. Intermediate waveform maintained. Heterogeneous and partially calcified plaque at the right carotid bifurcation. No significant lumen shadowing. Low resistance waveform of the right ICA. No significant tortuosity. RIGHT VERTEBRAL ARTERY: Antegrade flow with low resistance waveform. LEFT CAROTID  ARTERY: No significant calcifications of the left common carotid artery. Intermediate waveform maintained. Heterogeneous and partially calcified plaque at the left carotid bifurcation without significant lumen shadowing. Low resistance waveform of the left ICA. No significant tortuosity. LEFT VERTEBRAL ARTERY:  Antegrade flow with low resistance waveform. IMPRESSION: Color duplex indicates minimal heterogeneous and calcified plaque, with no hemodynamically significant stenosis by duplex criteria in the extracranial cerebrovascular circulation. Signed, Dulcy Fanny. Dellia Nims, RPVI [facility] Radiology Electronically Signed   By: Corrie Mckusick D.O.   On: 05/24/2018 10:46        Scheduled Meds: . chlorhexidine gluconate (MEDLINE KIT)  15 mL Mouth Rinse BID  . furosemide  40 mg Intravenous BID  . ipratropium-albuterol  3 mL Nebulization Q6H  . mouth rinse  15 mL Mouth Rinse 10 times per day  . metoprolol tartrate  5 mg Intravenous Q6H  . sodium chloride flush  3 mL Intravenous Q12H   Continuous Infusions: . sodium chloride    . famotidine (PEPCID) IV    . heparin 1,200 Units/hr (05/24/18 1532)     LOS: 1 day    Critical care time spent: 44mns. Patient was evaluated at the bedside, he was intubated for respiratory failure, chest xray was personally reviewed and time spent discussing/informing family of plan of care. He remains critically ill and will be moved to the ICU for closer monitoring.    JKathie Dike MD [facility] Pager [phone] If 7PM-7AM, please contact night-coverage [email or web address] Password [email or web address]9/18/2019, 6:24 PM

## 2018-05-24 NOTE — Progress Notes (Signed)
[place] for Heparin Indication: atrial fibrillation and Hx DVT, New CVA?  No Known Allergies  Patient Measurements: Height: 6' (182.9 cm) Weight: 187 lb 9.8 oz (85.1 kg) IBW/kg (Calculated) : 77.6 HEPARIN DW (KG): 88   Vital Signs: Temp: 100.4 F (38 C) (09/18 1544) Temp Source: Oral (09/18 1544) BP: 149/75 (09/18 1544) Pulse Rate: 90 (09/18 1544)  Labs: Recent Labs    05/29/2018 1743 06/02/2018 2232 05/24/18 0555 05/24/18 1428  HGB 10.5* 10.2*  --   --   HCT 33.9* 33.6*  --   --   PLT 260 250  --   --   APTT  --  31 33  --   LABPROT 14.1  --   --   --   INR 1.10  --   --   --   HEPARINUNFRC  --  0.16* 0.22* <0.10*  CREATININE 1.11  --  1.02  --   TROPONINI <0.03  --   --   --    Estimated Creatinine Clearance: 63.4 mL/min (by C-G formula based on SCr of 1.02 mg/dL).     Assessment: 82 yo man on xarelto PTA.  Baseline heparin level 0.16 units/ml and aPTT 31 sec.  Heparin level this am 0.22 units/ml.  Xarelto likely not having much effect on heparin level so will use heparin levels to guide therapy.  Heparin gtt was turned off for a prolonged period per RN due to off the floor imaging. Heparin iv 1200 units/hr was restarted at 1530 today, will need to recheck level in 6-8 hours.Adjust per protocol     Goal of Therapy:  Heparin level 0.3-0.7 units/ml  Monitor platelets by anticoagulation protocol: Yes   Plan:  Continue heparin drip to 1200 units/hr Check anti-Xa level and aPTT in 6-8 hours and daily while on heparin Continue to monitor H&H and platelets  Donna Christen Suzzane Quilter 05/24/2018,4:25 PM

## 2018-05-24 NOTE — Progress Notes (Signed)
Patient noted to have heart rate sustaining in the 120s-140s afib on telemetry. Vitals obtained, see flowsheet. Patient asleep, but opened eyes to voice.No apparent distress. Family reports history of afib. Dr. Roderic Palau notified. Orders received for metoprolol 5 mg IV for heart rate. toradol 15 mg IV x 1 for temp of 102.6 axillary. meds given as ordered. On reassessment, HR in the 80s, pt responsive to voice, opened eyes, pupils reactive. Patient noted to have O2 sats 84% on r/a. O2 at 2 lpm applied per A. Aggie Moats, Therapist, sports. Dr. Roderic Palau paged to notify. Patient then began to have agonal breathing. Rapid response called. Dr. Roderic Palau arrived to room and ordered intubation for patient. Patient intubated by ED physician with RT at bedside. Transferred to ICU Room 12 via bed accompanied by RT and nurses.  Donavan Foil, RN

## 2018-05-24 NOTE — Progress Notes (Signed)
[place] for Heparin Indication: atrial fibrillation and Hx DVT, New CVA?  No Known Allergies  Patient Measurements: Height: 6' (182.9 cm) Weight: 177 lb 7.5 oz (80.5 kg) IBW/kg (Calculated) : 77.6 HEPARIN DW (KG): 80.5   Vital Signs: Temp: 100.8 F (38.2 C) (09/18 2100) Temp Source: Axillary (09/18 1930) BP: 122/70 (09/18 2100) Pulse Rate: 86 (09/18 2100)  Labs: Recent Labs    05/13/2018 1743  05/22/2018 2232 05/24/18 0555 05/24/18 1428 05/24/18 2211  HGB 10.5*  --  10.2*  --   --   --   HCT 33.9*  --  33.6*  --   --   --   PLT 260  --  250  --   --   --   APTT  --   --  31 33  --   --   LABPROT 14.1  --   --   --   --   --   INR 1.10  --   --   --   --   --   HEPARINUNFRC  --    < > 0.16* 0.22* <0.10* 0.29*  CREATININE 1.11  --   --  1.02  --   --   TROPONINI <0.03  --   --   --   --   --    < > = values in this interval not displayed.   Estimated Creatinine Clearance: 63.4 mL/min (by C-G formula based on SCr of 1.02 mg/dL).     Assessment: 82 yo man on xarelto PTA.  Baseline heparin level 0.16 units/ml and aPTT 31 sec.  Heparin level this am 0.22 units/ml.  Xarelto likely not having much effect on heparin level so will use heparin levels to guide therapy.  Heparin gtt was turned off for a prolonged period per RN due to off the floor imaging. Heparin iv 1200 units/hr was restarted at 1530. Heparin level now 0.29 units/ml    Goal of Therapy:  Heparin level 0.3-0.7 units/ml  Monitor platelets by anticoagulation protocol: Yes   Plan:  Increase heparin drip to 1300 units/hr Check anti Xa  daily while on heparin Continue to monitor H&H and platelets  Pascual Mantel Poteet 05/24/2018,10:43 PM

## 2018-05-24 NOTE — Progress Notes (Signed)
*  PRELIMINARY RESULTS* Echocardiogram 2D Echocardiogram has been performed.  Leavy Cella 05/24/2018, 2:53 PM

## 2018-05-24 NOTE — Plan of Care (Signed)
  Problem: Acute Rehab PT Goals(only PT should resolve) Goal: Pt Will Go Supine/Side To Sit Outcome: Progressing Flowsheets (Taken 05/24/2018 0937) Pt will go Supine/Side to Sit: with moderate assist Goal: Patient Will Transfer Sit To/From Stand Outcome: Progressing Flowsheets (Taken 05/24/2018 0937) Patient will transfer sit to/from stand: with moderate assist Goal: Pt Will Transfer Bed To Chair/Chair To Bed Outcome: Progressing Flowsheets (Taken 05/24/2018 0937) Pt will Transfer Bed to Chair/Chair to Bed: with mod assist Goal: Pt Will Ambulate Outcome: Progressing Flowsheets (Taken 05/24/2018 0937) Pt will Ambulate: 10 feet; with moderate assist; with rolling walker   9:37 AM, 05/24/18 Lonell Grandchild, MPT Physical Therapist with [facility] [phone] office [phone] mobile phone

## 2018-05-24 NOTE — Progress Notes (Signed)
First echo attempt, patient in MRI.

## 2018-05-24 NOTE — Clinical Social Work Note (Signed)
Clinical Social Work Assessment  Patient Details  Name: Keith Doyle MRN: [id number] Date of Birth: [birth year]  Date of referral:  05/24/18               Reason for consult:  Facility Placement                Permission sought to share information with:    Permission granted to share information::     Name::        Agency::     Relationship::     Contact Information:  son, Barth Kirks and daughter [name] were at bedside.  Housing/Transportation Living arrangements for the past 2 months:  [place] of Information:  Adult Children Patient Interpreter Needed:  None Criminal Activity/Legal Involvement Pertinent to Current Situation/Hospitalization:  No - Comment as needed Significant Relationships:  Adult Children Lives with:  Self Do you feel safe going back to the place where you live?  Yes Need for family participation in patient care:  Yes (Comment)  Care giving concerns:  None identified at baseline.    Social Worker assessment / plan:  At baseline, patient is totally independent, drives, dances on Thursday. He keeps a cane in the car however he ambulates independently.  Patient's nephew is with him a lot during the day. His daughter, [name], visits daily after she gets off work.   Barth Kirks and [place] stated that they do not know if they desire SNF for short term rehab. They wanted to speak with patient's attending prior to making the decision. Family to contact LCSW with choices should the decide to accept short term rehab.   Employment status:  Retired Nurse, adult PT Recommendations:  [place] / Referral to community resources:  [place]  Patient/Family's Response to care:  Family is considering short term rehab.  Patient/Family's Understanding of and Emotional Response to Diagnosis, Current Treatment, and Prognosis:  Family understands patient's diagnosis, treatment and prognosis.   Emotional  Assessment Appearance:  Appears stated age Attitude/Demeanor/Rapport:    Affect (typically observed):  Unable to Assess(Patient was asleep) Orientation:  Oriented to Self, Oriented to Place, Oriented to  Time, Oriented to Situation Alcohol / Substance use:  Not Applicable Psych involvement (Current and /or in the community):  No (Comment)  Discharge Needs  Concerns to be addressed:  Discharge Planning Concerns Readmission within the last 30 days:  No Current discharge risk:  None Barriers to Discharge:  Other(family is unsure about SNF)   Ihor Gully, LCSW 05/24/2018, 2:54 PM
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
## 2018-05-24 NOTE — Evaluation (Signed)
Occupational Therapy Evaluation Patient Details Name: Keith Doyle MRN: [id number] DOB: 06/18/1936 Today's Date: 05/24/2018    History of Present Illness  Keith Doyle is a 82 y.o. male with medical history significant for atrial fibrillation on Xarelto, hypertension, dyslipidemia, iron deficiency anemia, grade 1 diastolic dysfunction seen on echocardiogram 06/2012 with LVEF 60 to 65%, and neuropathy who was brought to the emergency department with some worsening mentation along with worsening edema and weakness to left lower extremity.  Patient has been noted to have some worsening lower extremity swelling over the last several days and had seen his PCP with plans to initiate diuresis and this has led to trouble with ambulation at baseline.  Unfortunately, it appears that he was unable to move his left lower extremity adequately today, therefore causing a significantly diminished ability to ambulate.  He is apparently compliant with his medications including Xarelto on a daily basis.  He appears to deny any chest pain, shortness of breath, palpitations, headache, visual deficits, or speech deficits.  It is difficult to ascertain history from the patient given his altered mentation and daughter is the primary historian   Clinical Impression   OT arrived to room with PT already present and beginning evaluation. Co-evaluation completed with PT. Patient presents with left side weakness, decreased A/ROM, fine and gross motor coordination while requiring increased assistance to complete basic ADL tasks. At this time, I will recommend SNF at discharge due to decreased activity tolerance and endurance. If patient improves overall while admitted, he may need to reassessed for CIR instead. Skilled OT services will be provided during hospitalization to focus on mentioned deficits.     Follow Up Recommendations  SNF    Equipment Recommendations  Other (comment)(TBD)       Precautions / Restrictions  Precautions Precautions: Fall Precaution Comments: left side hemiparesis Restrictions Weight Bearing Restrictions: No      Mobility Bed Mobility Overal bed mobility: Needs Assistance Bed Mobility: Supine to Sit;Sit to Supine;Rolling Rolling: Max assist   Supine to sit: Max assist Sit to supine: Max assist   General bed mobility comments: Once supine in bed, patient presents with hyperextension of neck.  Transfers Overall transfer level: Needs assistance Equipment used: Rolling walker (2 wheeled) Transfers: Sit to/from Stand Sit to Stand: Max assist              Balance Overall balance assessment: Needs assistance Sitting-balance support: Feet supported;No upper extremity supported Sitting balance-Leahy Scale: Poor Sitting balance - Comments: fair/poor when supporting self with BUE, tends to lean forward      ADL either performed or assessed with clinical judgement   ADL Overall ADL's : Needs assistance/impaired       Lower Body Dressing: Total assistance;Bed level            Vision Baseline Vision/History: Wears glasses Wears Glasses: Reading only Vision Assessment?: Yes Eye Alignment: Within Functional Limits Ocular Range of Motion: Restricted on the left;Restricted looking up Alignment/Gaze Preference: Within Defined Limits Tracking/Visual Pursuits: Impaired - to be further tested in functional context Saccades: Within functional limits Visual Fields: No apparent deficits            Pertinent Vitals/Pain Pain Assessment: Faces Faces Pain Scale: Hurts even more Pain Location: left leg with movement Pain Descriptors / Indicators: Discomfort;Grimacing;Moaning Pain Intervention(s): Limited activity within patient's tolerance;Repositioned     Hand Dominance Right   Extremity/Trunk Assessment Upper Extremity Assessment Upper Extremity Assessment: LUE deficits/detail LUE Deficits / Details: patient was able  to achieve approximately 40% of range  for A/ROM shoulder flexion. Weak gross grasp with slow motor movements. Shoulder extension strength: 5/5. Shoulder flexion with shoulder at side: 5/5 LUE Coordination: decreased gross motor;decreased fine motor     Communication Communication Communication: Expressive difficulties(Raspy breathe when talking )   Cognition Arousal/Alertness: Lethargic Behavior During Therapy: Flat affect Overall Cognitive Status: Difficult to assess     General Comments: lethargic when lying down, more alert once sitting up at [address] expects to be discharged to:: Skilled nursing facility Living Arrangements: Alone Available Help at Discharge: Family;Friend(s) Type of Home: House Home Access: [place]: One level           Bathroom Accessibility: Yes   Home Equipment: [place] - 2 wheels;Cane - single point;Grab bars - tub/shower          Prior Functioning/Environment Level of Independence: Independent with assistive device(s)        Comments: community ambulator, drives        OT Problem List: Decreased strength;Decreased knowledge of use of DME or AE;Decreased knowledge of precautions;Decreased coordination;Decreased range of motion;Decreased activity tolerance;Impaired UE functional use;Impaired balance (sitting and/or standing);Decreased safety awareness      OT Treatment/Interventions: Self-care/ADL training;Modalities;Balance training;Therapeutic exercise;Neuromuscular education;Therapeutic activities;Energy conservation;DME and/or AE instruction;Patient/family education;Manual therapy;Visual/perceptual remediation/compensation    OT Goals(Current goals can be found in the care plan section) Acute Rehab OT Goals Patient Stated Goal: None stated OT Goal Formulation: Patient unable to participate in goal setting Time For Goal Achievement: 06/07/18 Potential to Achieve Goals: Good  OT Frequency: Min 2X/week            Co-evaluation PT/OT/SLP Co-Evaluation/Treatment: Yes Reason for Co-Treatment: Complexity of the patient's impairments (multi-system involvement);Necessary to address cognition/behavior during functional activity;To address functional/ADL transfers   OT goals addressed during session: ADL's and self-care;Strengthening/ROM      AM-PAC PT "6 Clicks" Daily Activity     Outcome Measure Help from another person eating meals?: Total Help from another person taking care of personal grooming?: Total Help from another person toileting, which includes using toliet, bedpan, or urinal?: Total Help from another person bathing (including washing, rinsing, drying)?: Total Help from another person to put on and taking off regular upper body clothing?: Total Help from another person to put on and taking off regular lower body clothing?: Total 6 Click Score: 6   End of Session Equipment Utilized During Treatment: Gait belt;Rolling walker  Activity Tolerance: Patient tolerated treatment well;Patient limited by fatigue Patient left: in bed;with call bell/phone within reach;with bed alarm set;with family/visitor present  OT Visit Diagnosis: Muscle weakness (generalized) (M62.81)                Time: 4132-4401 OT Time Calculation (min): 18 min Charges:  OT General Charges $OT Visit: 1 Visit OT Evaluation $OT Eval High Complexity: 1 High  [facility], OTR/L,CBIS  [phone]   Reiss Mowrey, Clarene Duke 05/24/2018, 10:00 AM

## 2018-05-24 NOTE — Progress Notes (Signed)
Pharmacy Antibiotic Note  Keith Doyle is a 82 y.o. male admitted on 06/04/2018 with pneumonia.  Pharmacy has been consulted for zosyn aspiration pna dosing.  Plan: Zosyn 3.375g IV q8h (4 hour infusion).  Height: 6' (182.9 cm) Weight: 177 lb 7.5 oz (80.5 kg) IBW/kg (Calculated) : 77.6  Temp (24hrs), Avg:99.6 F (37.6 C), Min:97.8 F (36.6 C), Max:102.6 F (39.2 C)  Recent Labs  Lab 05/19/18 1003 05/24/2018 1743 05/24/2018 2232 05/24/18 0555  WBC 4.4 6.5 6.3  --   CREATININE 1.26 1.11  --  1.02    Estimated Creatinine Clearance: 63.4 mL/min (by C-G formula based on SCr of 1.02 mg/dL).    No Known Allergies  Antimicrobials this admission: Anti-infectives (From admission, onward)   Start     Dose/Rate Route Frequency Ordered Stop   05/24/18 1930  piperacillin-tazobactam (ZOSYN) IVPB 3.375 g     3.375 g 12.5 mL/hr over 240 Minutes Intravenous Every 8 hours 05/24/18 1928        Microbiology results: 9/18 BCx: sent   Thank you for allowing pharmacy to be a part of this patient's care.  Donna Christen Victory Strollo 05/24/2018 7:28 PM

## 2018-05-24 NOTE — Plan of Care (Signed)
  Problem: Acute Rehab OT Goals (only OT should resolve) Goal: Pt. Will Perform Eating Flowsheets (Taken 05/24/2018 1030) Pt Will Perform Eating: with min assist; sitting Goal: Pt. Will Perform Grooming Flowsheets (Taken 05/24/2018 1030) Pt Will Perform Grooming: with min assist; sitting Goal: Pt. Will Perform Upper Body Bathing Flowsheets (Taken 05/24/2018 1030) Pt Will Perform Upper Body Bathing: with min assist; sitting Goal: Pt. Will Transfer To Toilet Flowsheets (Taken 05/24/2018 1030) Pt Will Transfer to Toilet: with mod assist; stand pivot transfer; bedside commode Goal: Pt/Caregiver Will Perform Home Exercise Program Flowsheets (Taken 05/24/2018 1030) Pt/caregiver will Perform Home Exercise Program: Increased strength; Left upper extremity; With Supervision; With written HEP provided

## 2018-05-24 NOTE — Evaluation (Signed)
Physical Therapy Evaluation Patient Details Name: Keith Doyle MRN: [id number] DOB: 06/18/1936 Today's Date: 05/24/2018   History of Present Illness   Keith Doyle is a 82 y.o. male with medical history significant for atrial fibrillation on Xarelto, hypertension, dyslipidemia, iron deficiency anemia, grade 1 diastolic dysfunction seen on echocardiogram 06/2012 with LVEF 60 to 65%, and neuropathy who was brought to the emergency department with some worsening mentation along with worsening edema and weakness to left lower extremity.  Patient has been noted to have some worsening lower extremity swelling over the last several days and had seen his PCP with plans to initiate diuresis and this has led to trouble with ambulation at baseline.  Unfortunately, it appears that he was unable to move his left lower extremity adequately today, therefore causing a significantly diminished ability to ambulate.  He is apparently compliant with his medications including Xarelto on a daily basis.  He appears to deny any chest pain, shortness of breath, palpitations, headache, visual deficits, or speech deficits.  It is difficult to ascertain history from the patient given his altered mentation and daughter is the primary historian    Clinical Impression  Patient presents lethargic, but became more alert once sitting and able to follow directions consistently, tends to lean forward while sitting, unable to lift LLE against gravity due to weakness, stood with RW and unable to take steps with either leg due to weakness and required total assistance to reposition when put back to bed.  Patient will benefit from continued physical therapy in hospital and recommended venue below to increase strength, balance, endurance for safe ADLs and gait.    Follow Up Recommendations SNF    Equipment Recommendations  None recommended by PT    Recommendations for Other Services       Precautions / Restrictions  Precautions Precautions: Fall Restrictions Weight Bearing Restrictions: No      Mobility  Bed Mobility Overal bed mobility: Needs Assistance Bed Mobility: Supine to Sit;Sit to Supine;Rolling Rolling: Max assist   Supine to sit: Max assist Sit to supine: Max assist      Transfers Overall transfer level: Needs assistance Equipment used: Rolling walker (2 wheeled) Transfers: Sit to/from Stand Sit to Stand: Max assist            Ambulation/Gait                Stairs            Wheelchair Mobility    Modified Rankin (Stroke Patients Only)       Balance Overall balance assessment: Needs assistance Sitting-balance support: Feet supported;No upper extremity supported Sitting balance-Leahy Scale: Poor Sitting balance - Comments: fair/poor when supporting self with BUE, tends to lean forward   Standing balance support: During functional activity;Bilateral upper extremity supported Standing balance-Leahy Scale: Poor Standing balance comment: using RW                             Pertinent Vitals/Pain Pain Assessment: Faces Faces Pain Scale: Hurts even more Pain Location: left leg with movement Pain Descriptors / Indicators: Discomfort;Grimacing;Moaning Pain Intervention(s): Limited activity within patient's tolerance;Monitored during session    Home Living Family/patient expects to be discharged to:: Private residence Living Arrangements: Alone Available Help at Discharge: Family;Friend(s) Type of Home: House Home Access: Ramped entrance     Home Layout: One level Home Equipment: Environmental consultant - 2 wheels;Cane - single point;Grab bars - tub/shower  Prior Function Level of Independence: Independent with assistive device(s)         Comments: community ambulator, drives     Hand Dominance   Dominant Hand: Right    Extremity/Trunk Assessment   Upper Extremity Assessment Upper Extremity Assessment: Defer to OT evaluation     Lower Extremity Assessment Lower Extremity Assessment: Generalized weakness;RLE deficits/detail;LLE deficits/detail RLE Deficits / Details: grossly 3+/5 LLE Deficits / Details: grossly 2+/5    Cervical / Trunk Assessment Cervical / Trunk Assessment: Kyphotic  Communication   Communication: Expressive difficulties  Cognition Arousal/Alertness: Awake/alert;Lethargic Behavior During Therapy: WFL for tasks assessed/performed Overall Cognitive Status: Within Functional Limits for tasks assessed                                 General Comments: lethargic when lying down, more alert once sitting up at bedside      General Comments      Exercises     Assessment/Plan    PT Assessment Patient needs continued PT services  PT Problem List Decreased strength;Decreased activity tolerance;Decreased balance;Decreased mobility       PT Treatment Interventions Gait training;Functional mobility training;Therapeutic activities;Therapeutic exercise;Patient/family education;Neuromuscular re-education    PT Goals (Current goals can be found in the Care Plan section)  Acute Rehab PT Goals Patient Stated Goal: return home  PT Goal Formulation: With patient/family Time For Goal Achievement: 06/07/18 Potential to Achieve Goals: Good    Frequency 7X/week   Barriers to discharge        Co-evaluation               AM-PAC PT "6 Clicks" Daily Activity  Outcome Measure Difficulty turning over in bed (including adjusting bedclothes, sheets and blankets)?: A Lot Difficulty moving from lying on back to sitting on the side of the bed? : A Lot Difficulty sitting down on and standing up from a chair with arms (e.g., wheelchair, bedside commode, etc,.)?: A Lot Help needed moving to and from a bed to chair (including a wheelchair)?: Total Help needed walking in hospital room?: Total Help needed climbing 3-5 steps with a railing? : Total 6 Click Score: 9    End of Session  Equipment Utilized During Treatment: Gait belt Activity Tolerance: Patient tolerated treatment well;Patient limited by fatigue Patient left: in bed;with call bell/phone within reach;with bed alarm set Nurse Communication: Mobility status PT Visit Diagnosis: Unsteadiness on feet (R26.81);Other abnormalities of gait and mobility (R26.89);Muscle weakness (generalized) (M62.81)    Time: 0511-0211 PT Time Calculation (min) (ACUTE ONLY): 36 min   Charges:   PT Evaluation $PT Eval Moderate Complexity: 1 Mod PT Treatments $Therapeutic Activity: 23-37 mins        9:35 AM, 05/24/18 Lonell Grandchild, MPT Physical Therapist with [facility] [phone] office [phone] mobile phone

## 2018-05-25 ENCOUNTER — Inpatient Hospital Stay (HOSPITAL_COMMUNITY): Payer: Medicare HMO

## 2018-05-25 DIAGNOSIS — J690 Pneumonitis due to inhalation of food and vomit: Secondary | ICD-10-CM | POA: Diagnosis not present

## 2018-05-25 LAB — BLOOD CULTURE ID PANEL (REFLEXED)
Acinetobacter baumannii: NOT DETECTED
CANDIDA KRUSEI: NOT DETECTED
CANDIDA PARAPSILOSIS: NOT DETECTED
CANDIDA TROPICALIS: NOT DETECTED
Candida albicans: NOT DETECTED
Candida glabrata: NOT DETECTED
ESCHERICHIA COLI: NOT DETECTED
Enterobacter cloacae complex: NOT DETECTED
Enterobacteriaceae species: NOT DETECTED
Enterococcus species: NOT DETECTED
Haemophilus influenzae: NOT DETECTED
KLEBSIELLA OXYTOCA: NOT DETECTED
KLEBSIELLA PNEUMONIAE: NOT DETECTED
Listeria monocytogenes: NOT DETECTED
Methicillin resistance: DETECTED — AB
Neisseria meningitidis: NOT DETECTED
PSEUDOMONAS AERUGINOSA: NOT DETECTED
Proteus species: NOT DETECTED
SERRATIA MARCESCENS: NOT DETECTED
STAPHYLOCOCCUS AUREUS BCID: NOT DETECTED
STREPTOCOCCUS AGALACTIAE: NOT DETECTED
STREPTOCOCCUS PNEUMONIAE: NOT DETECTED
STREPTOCOCCUS PYOGENES: NOT DETECTED
Staphylococcus species: DETECTED — AB
Streptococcus species: NOT DETECTED

## 2018-05-25 LAB — COMPREHENSIVE METABOLIC PANEL
ALBUMIN: 3.5 g/dL (ref 3.5–5.0)
ALK PHOS: 43 U/L (ref 38–126)
ALT: 16 U/L (ref 0–44)
AST: 16 U/L (ref 15–41)
Anion gap: 10 (ref 5–15)
BUN: 25 mg/dL — ABNORMAL HIGH (ref 8–23)
CALCIUM: 9.2 mg/dL (ref 8.9–10.3)
CO2: 27 mmol/L (ref 22–32)
Chloride: 113 mmol/L — ABNORMAL HIGH (ref 98–111)
Creatinine, Ser: 1.75 mg/dL — ABNORMAL HIGH (ref 0.61–1.24)
GFR calc non Af Amer: 35 mL/min — ABNORMAL LOW (ref >60)
GFR, EST AFRICAN AMERICAN: 41 mL/min — AB (ref >60)
GLUCOSE: 161 mg/dL — AB (ref 70–99)
POTASSIUM: 3.2 mmol/L — AB (ref 3.5–5.1)
SODIUM: 150 mmol/L — AB (ref 135–145)
TOTAL PROTEIN: 6.7 g/dL (ref 6.5–8.1)
Total Bilirubin: 0.9 mg/dL (ref 0.3–1.2)

## 2018-05-25 LAB — BLOOD GAS, ARTERIAL
Acid-Base Excess: 2.9 mmol/L — ABNORMAL HIGH (ref 0.0–2.0)
Bicarbonate: 27.3 mmol/L (ref 20.0–28.0)
DRAWN BY: 28459
FIO2: 28
MECHVT: 620 mL
O2 SAT: 96.3 %
PEEP: 5 cmH2O
PH ART: 7.48 — AB (ref 7.350–7.450)
PO2 ART: 83.8 mmHg (ref 83.0–108.0)
Patient temperature: 37
RATE: 18 resp/min
pCO2 arterial: 35.8 mmHg (ref 32.0–48.0)

## 2018-05-25 LAB — CBC
HEMATOCRIT: 35.7 % — AB (ref 39.0–52.0)
Hemoglobin: 10.9 g/dL — ABNORMAL LOW (ref 13.0–17.0)
MCH: 28.2 pg (ref 26.0–34.0)
MCHC: 30.5 g/dL (ref 30.0–36.0)
MCV: 92.2 fL (ref 78.0–100.0)
PLATELETS: 263 10*3/uL (ref 150–400)
RBC: 3.87 MIL/uL — ABNORMAL LOW (ref 4.22–5.81)
RDW: 17.4 % — AB (ref 11.5–15.5)
WBC: 11.1 10*3/uL — AB (ref 4.0–10.5)

## 2018-05-25 LAB — HEPARIN LEVEL (UNFRACTIONATED): HEPARIN UNFRACTIONATED: 0.27 [IU]/mL — AB (ref 0.30–0.70)

## 2018-05-25 LAB — GLUCOSE, CAPILLARY
GLUCOSE-CAPILLARY: 160 mg/dL — AB (ref 70–99)
GLUCOSE-CAPILLARY: 126 mg/dL — AB (ref 70–99)
Glucose-Capillary: 150 mg/dL — ABNORMAL HIGH (ref 70–99)
Glucose-Capillary: 166 mg/dL — ABNORMAL HIGH (ref 70–99)

## 2018-05-25 LAB — HEMOGLOBIN A1C
HEMOGLOBIN A1C: 5.5 % (ref 4.8–5.6)
MEAN PLASMA GLUCOSE: 111 mg/dL

## 2018-05-25 MED ORDER — ASPIRIN 300 MG RE SUPP
300.0000 mg | Freq: Every day | RECTAL | Status: DC
  Administered 2018-05-25 – 2018-06-04 (×11): 300 mg via RECTAL
  Filled 2018-05-25 (×10): qty 1

## 2018-05-25 MED ORDER — INSULIN ASPART 100 UNIT/ML ~~LOC~~ SOLN
0.0000 [IU] | SUBCUTANEOUS | Status: DC
  Administered 2018-05-25 (×2): 3 [IU] via SUBCUTANEOUS
  Administered 2018-05-25: 2 [IU] via SUBCUTANEOUS
  Administered 2018-05-26: 3 [IU] via SUBCUTANEOUS
  Administered 2018-05-26: 1 [IU] via SUBCUTANEOUS
  Administered 2018-05-26: 3 [IU] via SUBCUTANEOUS
  Administered 2018-05-26 – 2018-05-27 (×2): 2 [IU] via SUBCUTANEOUS
  Administered 2018-05-27: 3 [IU] via SUBCUTANEOUS
  Administered 2018-05-27 (×2): 2 [IU] via SUBCUTANEOUS
  Administered 2018-05-27: 3 [IU] via SUBCUTANEOUS
  Administered 2018-05-28: 2 [IU] via SUBCUTANEOUS
  Administered 2018-05-28: 3 [IU] via SUBCUTANEOUS
  Administered 2018-05-28 – 2018-05-31 (×14): 2 [IU] via SUBCUTANEOUS
  Administered 2018-05-31 (×2): 3 [IU] via SUBCUTANEOUS
  Administered 2018-05-31 (×2): 2 [IU] via SUBCUTANEOUS
  Administered 2018-05-31 – 2018-06-01 (×3): 3 [IU] via SUBCUTANEOUS
  Administered 2018-06-01 (×2): 2 [IU] via SUBCUTANEOUS
  Administered 2018-06-01 (×2): 3 [IU] via SUBCUTANEOUS
  Administered 2018-06-02 (×4): 5 [IU] via SUBCUTANEOUS

## 2018-05-25 MED ORDER — ATORVASTATIN CALCIUM 40 MG PO TABS
80.0000 mg | ORAL_TABLET | Freq: Every day | ORAL | Status: DC
  Filled 2018-05-25: qty 2

## 2018-05-25 MED ORDER — VANCOMYCIN HCL IN DEXTROSE 1-5 GM/200ML-% IV SOLN
1000.0000 mg | Freq: Once | INTRAVENOUS | Status: AC
  Administered 2018-05-25: 1000 mg via INTRAVENOUS
  Filled 2018-05-25: qty 200

## 2018-05-25 MED ORDER — POTASSIUM CHLORIDE 2 MEQ/ML IV SOLN
INTRAVENOUS | Status: DC
  Administered 2018-05-25 – 2018-05-30 (×8): via INTRAVENOUS
  Filled 2018-05-25 (×16): qty 1000

## 2018-05-25 MED ORDER — ENOXAPARIN SODIUM 80 MG/0.8ML ~~LOC~~ SOLN
80.0000 mg | Freq: Two times a day (BID) | SUBCUTANEOUS | Status: DC
  Administered 2018-05-25 – 2018-06-04 (×22): 80 mg via SUBCUTANEOUS
  Filled 2018-05-25 (×22): qty 0.8

## 2018-05-25 MED ORDER — VANCOMYCIN HCL IN DEXTROSE 1-5 GM/200ML-% IV SOLN: 1000.0000 mg | INTRAVENOUS | Status: DC

## 2018-05-25 MED ORDER — SODIUM CHLORIDE 0.9 % IV SOLN
3.0000 g | Freq: Three times a day (TID) | INTRAVENOUS | Status: DC
  Administered 2018-05-25 – 2018-05-28 (×11): 3 g via INTRAVENOUS
  Filled 2018-05-25 (×16): qty 3

## 2018-05-25 NOTE — Progress Notes (Signed)
PT Cancellation Note  Patient Details Name: Keith Doyle MRN: [id number] DOB: 11/09/1935   Cancelled Treatment:    Reason Eval/Treat Not Completed: Medical issues which prohibited therapy.  Patient transferred to a higher level of care and will need new PT consult resume therapy when patient is medically stable.  Thank you.   1:48 PM, 05/25/18 Lonell Grandchild, MPT Physical Therapist with [facility] [phone] office [phone] mobile phone

## 2018-05-25 NOTE — Progress Notes (Signed)
SLP Cancellation Note  Patient Details Name: Keith Doyle MRN: [id number] DOB: 08/06/1936   Cancelled treatment:       Reason Eval/Treat Not Completed: Medical issues which prohibited therapy; Pt transferred to higher level of care and is now intubated. Pt will need a new order once medically able to participate.  Thank you,  Genene Churn, [place]    [place] 05/25/2018, 2:01 PM

## 2018-05-25 NOTE — Plan of Care (Signed)
  Problem: Acute Rehab OT Goals (only OT should resolve) Goal: Pt. Will Perform Eating Outcome: Not Met (add Reason) Note:  Patient transferred to a higher level of care and will need new OT consult resume therapy when patient is medically stable.   Goal: Pt. Will Perform Grooming Outcome: Not Met (add Reason) Note:  Patient transferred to a higher level of care and will need new OT consult resume therapy when patient is medically stable.   Goal: Pt. Will Perform Upper Body Bathing Outcome: Not Met (add Reason) Note:  Patient transferred to a higher level of care and will need new OT consult resume therapy when patient is medically stable.    Goal: Pt. Will Transfer To Toilet Outcome: Not Met (add Reason) Note:  Patient transferred to a higher level of care and will need new OT consult resume therapy when patient is medically stable.    Goal: Pt/Caregiver Will Perform Home Exercise Program Outcome: Not Met (add Reason) Note:  Patient transferred to a higher level of care and will need new OT consult resume therapy when patient is medically stable.
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
## 2018-05-25 NOTE — Progress Notes (Signed)
[place]en for lovenox Indication: atrial fibrillation  No Known Allergies  Patient Measurements: Height: 6' (182.9 cm) Weight: 181 lb 3.5 oz (82.2 kg) IBW/kg (Calculated) : 77.6 HEPARIN DW (KG): 80.5   Vital Signs: Temp: 100.8 F (38.2 C) (09/19 1100) Temp Source: Axillary (09/19 0500) BP: 139/82 (09/19 1100) Pulse Rate: 97 (09/19 1100)  Labs: Recent Labs    05/17/2018 1743  05/25/2018 2232 05/24/18 0555 05/24/18 1428 05/24/18 2211 05/25/18 0440  HGB 10.5*  --  10.2*  --   --   --  10.9*  HCT 33.9*  --  33.6*  --   --   --  35.7*  PLT 260  --  250  --   --   --  263  APTT  --   --  31 33  --   --   --   LABPROT 14.1  --   --   --   --   --   --   INR 1.10  --   --   --   --   --   --   HEPARINUNFRC  --    < > 0.16* 0.22* <0.10* 0.29* 0.27*  CREATININE 1.11  --   --  1.02  --   --  1.75*  TROPONINI <0.03  --   --   --   --   --   --    < > = values in this interval not displayed.   Estimated Creatinine Clearance: 37 mL/min (A) (by C-G formula based on SCr of 1.75 mg/dL (H)).     Assessment: 82 yo man on xarelto PTA.   Patient transitioning from heparin to lovenox per neurology recommendations.  Goal of Therapy:   Monitor platelets by anticoagulation protocol: Yes   Plan:  Lovenox 80 mg every 12 hours Continue to monitor H&H and platelets Transition to home dose of Xarelto when appropriate  [employer] 05/25/2018,11:17 AM

## 2018-05-25 NOTE — Progress Notes (Signed)
OG tube removed per Dr. Luan Pulling verbal order due to it possibly being coiled in the pt's hiatal hernia. New OG tube insertion attempted x2 by this RN. With each attempt, the tube coiled in the back of the pt's throat and would not advance. MD notified.

## 2018-05-25 NOTE — Progress Notes (Signed)
Keith A. Merlene Laughter, MD     [email or web address]          Keith Doyle is an 82 y.o. male.   Assessment/Plan: 1.  Acute encephalopathy due to respiratory failure: This is apparently due to aspiration and possible pneumonia. 2.  Acute left hemiparesis due to right basal ganglia infarct -this is undoubtedly due to atrial fibrillation: Risk factors atrial fibrillation, hypertension, dyslipidemia and previous TIA.  Patient currently is on IV heparin.  I think it may be more beneficial to place the patient on Lovenox to give him more predictable level of anticoagulation.  Short-term it is reasonable to place the patient on aspirin for couple of weeks.  Patient can restart his baseline Xarelto when appropriate.     Patient continues to be febrile today.  He still on the ventilator.  Repeat chest x-ray shows evidence of likely progressive pneumonia.  He is somewhat less responsive today.    GENERAL: The patient is currently intubated.  HEENT: Neck appears to be supple.  There is a marked right exotropia.  ABDOMEN: soft  EXTREMITIES: No edema   BACK: Normal  SKIN: Normal by inspection.    MENTAL STATUS: He Eyes closed.  He opens his eyes briefly to repeated deep painful stimuli.  He follows midline commands intermittently.    He does not follow appendicular commands.  CRANIAL NERVES: Pupils are equal, round and reactive to light; extra ocular movements are full using OCR, visual fields are full -appears full but this is limited; upper and lower facial muscles are normal in strength and symmetric, there is no flattening of the nasolabial folds.  MOTOR: There is minimal movements of the painful stimuli of the lower extremities.  The patient does flexion withdrawal to painful stimuli the upper extremities.  He does is vigorously on the right but much less on the left side.  COORDINATION: No tremors or dysmetria is appreciated.   SENSATION: He responds  to painful stimuli bilaterally.     Objective: Vital signs in last 24 hours: Temp:  [98.7 F (37.1 C)-102.9 F (39.4 C)] 102.9 F (39.4 C) (09/19 1800) Pulse Rate:  [26-126] 77 (09/19 1800) Resp:  [16-21] 18 (09/19 1800) BP: (113-148)/(65-99) 125/73 (09/19 1800) SpO2:  [93 %-100 %] 97 % (09/19 1800) FiO2 (%):  [28 %-100 %] 28 % (09/19 1502) Weight:  [80.5 kg-82.2 kg] 82.2 kg (09/19 0500)  Intake/Output from previous day: 09/18 0701 - 09/19 0700 In: 282.3 [I.V.:194.2; IV Piggyback:88.2] Out: -  Intake/Output this shift: Total I/O In: 555.9 [I.V.:455.9; IV Piggyback:100] Out: -  Nutritional status:  Diet Order            Diet NPO time specified  Diet effective now               Lab Results: Results for orders placed or performed during the hospital encounter of 05/14/2018 (from the past 48 hour(s))  Hemoglobin A1c     Status: None   Collection Time: 05/22/2018 10:32 PM  Result Value Ref Range   Hgb A1c MFr Bld 5.5 4.8 - 5.6 %    Comment: (NOTE)         Prediabetes: 5.7 - 6.4         Diabetes: >6.4         Glycemic control for adults with diabetes: <7.0    Mean Plasma Glucose 111 mg/dL    Comment: (NOTE) Performed At: [facility] [address] [place], [place] [id number] Rush Farmer  MD FG:[id number]   CBC     Status: Abnormal   Collection Time: 05/09/2018 10:32 PM  Result Value Ref Range   WBC 6.3 4.0 - 10.5 K/uL   RBC 3.71 (L) 4.22 - 5.81 MIL/uL   Hemoglobin 10.2 (L) 13.0 - 17.0 g/dL   HCT 33.6 (L) 39.0 - 52.0 %   MCV 90.6 78.0 - 100.0 fL   MCH 27.5 26.0 - 34.0 pg   MCHC 30.4 30.0 - 36.0 g/dL   RDW 17.2 (H) 11.5 - 15.5 %   Platelets 250 150 - 400 K/uL    Comment: Performed at [facility], [address] [place], [place] [address]  Heparin level (unfractionated)     Status: Abnormal   Collection Time: 05/12/2018 10:32 PM  Result Value Ref Range   Heparin Unfractionated 0.16 (L) 0.30 - 0.70 IU/mL    Comment: (NOTE) If heparin results are  below expected values, and patient dosage has  been confirmed, suggest follow up testing of antithrombin III levels. Performed at [facility], [address], [place], [place] [address]   APTT     Status: None   Collection Time: 05/20/2018 10:32 PM  Result Value Ref Range   aPTT 31 24 - 36 seconds    Comment: Performed at [facility], [address], [place], [place] [address]  Lipid panel     Status: Abnormal   Collection Time: 05/24/18  5:55 AM  Result Value Ref Range   Cholesterol 151 0 - 200 mg/dL   Triglycerides 150 (H) <150 mg/dL   HDL 34 (L) >40 mg/dL   Total CHOL/HDL Ratio 4.4 RATIO   VLDL 30 0 - 40 mg/dL   LDL Cholesterol 87 0 - 99 mg/dL    Comment:        Total Cholesterol/HDL:CHD Risk Coronary Heart Disease Risk Table                     Men   Women  1/2 Average Risk   3.4   3.3  Average Risk       5.0   4.4  2 X Average Risk   9.6   7.1  3 X Average Risk  23.4   11.0        Use the calculated Patient Ratio above and the CHD Risk Table to determine the patient's CHD Risk.        ATP III CLASSIFICATION (LDL):  <100     mg/dL   Optimal  100-129  mg/dL   Near or Above                    Optimal  130-159  mg/dL   Borderline  160-189  mg/dL   High  >190     mg/dL   Very High Performed at [facility], [address], [place], [place] [address]   Magnesium     Status: None   Collection Time: 05/24/18  5:55 AM  Result Value Ref Range   Magnesium 2.0 1.7 - 2.4 mg/dL    Comment: Performed at [facility], [address] [place], [place] [address]  Basic metabolic panel     Status: Abnormal   Collection Time: 05/24/18  5:55 AM  Result Value Ref Range   Sodium 148 (H) 135 - 145 mmol/L   Potassium 3.4 (L) 3.5 - 5.1 mmol/L   Chloride 113 (H) 98 - 111 mmol/L   CO2 27 22 - 32 mmol/L   Glucose,  Bld 154 (H) 70 - 99 mg/dL   BUN 13 8 - 23 mg/dL   Creatinine, Ser 1.02 0.61 - 1.24 mg/dL   Calcium 9.6 8.9 - 10.3 mg/dL   GFR calc non Af Amer >60 >60 mL/min   GFR calc Af Amer  >60 >60 mL/min    Comment: (NOTE) The eGFR has been calculated using the CKD EPI equation. This calculation has not been validated in all clinical situations. eGFR's persistently <60 mL/min signify possible Chronic Kidney Disease.    Anion gap 8 5 - 15    Comment: Performed at [facility], [address], [place], [place] [address]  Heparin level (unfractionated)     Status: Abnormal   Collection Time: 05/24/18  5:55 AM  Result Value Ref Range   Heparin Unfractionated 0.22 (L) 0.30 - 0.70 IU/mL    Comment: (NOTE) If heparin results are below expected values, and patient dosage has  been confirmed, suggest follow up testing of antithrombin III levels. Performed at [facility], [address] [place], [place] [address]   APTT     Status: None   Collection Time: 05/24/18  5:55 AM  Result Value Ref Range   aPTT 33 24 - 36 seconds    Comment: Performed at [facility], [address], [place], [place] [address]  Heparin level (unfractionated)     Status: Abnormal   Collection Time: 05/24/18  2:28 PM  Result Value Ref Range   Heparin Unfractionated <0.10 (L) 0.30 - 0.70 IU/mL    Comment: (NOTE) If heparin results are below expected values, and patient dosage has  been confirmed, suggest follow up testing of antithrombin III levels. Performed at [facility], [address] [place], [place] [address]   Culture, blood (routine x 2)     Status: None (Preliminary result)   Collection Time: 05/24/18  6:54 PM  Result Value Ref Range   Specimen Description      BLOOD RIGHT HAND Performed at [facility], [address], [place], [place] [address]    Special Requests      BOTTLES DRAWN AEROBIC AND ANAEROBIC Blood Culture adequate volume Performed at [facility], [address] [place], [place] [address]    Culture  Setup Time      GRAM POSITIVE COCCI ANAEROBIC BOTTLE GRAM STAINED RESULT CALLED TO/READ BACK FROM MISTY SMART AT 1300  BY HFLYNT 05/25/18 Organism ID to follow Performed  at [facility], [place] [address], [place], [place] [address]    Culture GRAM POSITIVE COCCI    Report Status PENDING   Lactic acid, plasma     Status: Abnormal   Collection Time: 05/24/18  6:54 PM  Result Value Ref Range   Lactic Acid, Venous 2.1 (HH) 0.5 - 1.9 mmol/L    Comment: CRITICAL RESULT CALLED TO, READ BACK BY AND VERIFIED WITH: WADE,S ON 05/24/18 AT 2020 BY LOY,C Performed at [facility], [address] [place], [place] [address]   Ammonia     Status: None   Collection Time: 05/24/18  6:54 PM  Result Value Ref Range   Ammonia 33 9 - 35 umol/L    Comment: Performed at [facility], [address] [place], [place] [address]  Culture, blood (routine x 2)     Status: None (Preliminary result)   Collection Time: 05/24/18  7:03 PM  Result Value Ref Range   Specimen Description BLOOD LEFT HAND    Special Requests      BOTTLES DRAWN AEROBIC  AND ANAEROBIC Blood Culture adequate volume   Culture      NO GROWTH < 12 HOURS Performed at [facility], [address] [place], [place] [address]    Report Status PENDING   Blood gas, arterial     Status: Abnormal   Collection Time: 05/24/18  8:31 PM  Result Value Ref Range   FIO2 100.00    Delivery systems VENTILATOR    Mode PRESSURE REGULATED VOLUME CONTROL    VT 620 mL   LHR 18 resp/min   Peep/cpap 5.0 cm H20   pH, Arterial 7.477 (H) 7.350 - 7.450   pCO2 arterial 35.9 32.0 - 48.0 mmHg   pO2, Arterial 346 (H) 83.0 - 108.0 mmHg   Bicarbonate RBV 20.0 - 28.0 mmol/L    Comment: UNABLE TO READ DUE TO CRITICALLY HIGH sO2  JAMES DANIELS, RN GIVEN RESULTS OF CRITICAL SO2    Acid-Base Excess 2.8 (H) 0.0 - 2.0 mmol/L   Patient temperature 38.3    Collection site LEFT RADIAL    Drawn by [phone]    Sample type ARTERIAL DRAW    Allens test (pass/fail) PASS PASS    Comment: Performed at [facility], [address], [place], [place] [address]  Heparin level (unfractionated)     Status: Abnormal   Collection Time: 05/24/18 10:11 PM    Result Value Ref Range   Heparin Unfractionated 0.29 (L) 0.30 - 0.70 IU/mL    Comment: (NOTE) If heparin results are below expected values, and patient dosage has  been confirmed, suggest follow up testing of antithrombin III levels. Performed at [facility], [address], [place], [place] [address]   Lactic acid, plasma     Status: None   Collection Time: 05/24/18 10:11 PM  Result Value Ref Range   Lactic Acid, Venous 1.5 0.5 - 1.9 mmol/L    Comment: Performed at [facility], [address] [place], [place] [address]  Blood gas, arterial     Status: Abnormal   Collection Time: 05/24/18 10:11 PM  Result Value Ref Range   FIO2 28.00    Delivery systems VENTILATOR    Mode PRESSURE REGULATED VOLUME CONTROL    VT 620 mL   LHR 18 resp/min   Peep/cpap 5.0 cm H20   pH, Arterial 7.488 (H) 7.350 - 7.450   pCO2 arterial 34.1 32.0 - 48.0 mmHg   pO2, Arterial 72.3 (L) 83.0 - 108.0 mmHg   Bicarbonate 26.9 20.0 - 28.0 mmol/L   Acid-Base Excess 2.4 (H) 0.0 - 2.0 mmol/L   O2 Saturation 94.4 %   Patient temperature 38.0    Collection site LEFT RADIAL    Drawn by [phone]    Sample type ARTERIAL DRAW    Allens test (pass/fail) PASS PASS    Comment: Performed at [facility], [address] [place], [place] [address]  Heparin level (unfractionated)     Status: Abnormal   Collection Time: 05/25/18  4:40 AM  Result Value Ref Range   Heparin Unfractionated 0.27 (L) 0.30 - 0.70 IU/mL    Comment: (NOTE) If heparin results are below expected values, and patient dosage has  been confirmed, suggest follow up testing of antithrombin III levels. Performed at [facility], [address], [place], [place] [address]   CBC     Status: Abnormal   Collection Time: 05/25/18  4:40 AM  Result Value Ref Range   WBC 11.1 (H) 4.0 - 10.5 K/uL   RBC 3.87 (L) 4.22 - 5.81 MIL/uL  Hemoglobin 10.9 (L) 13.0 - 17.0 g/dL   HCT 35.7 (L) 39.0 - 52.0 %   MCV 92.2 78.0 - 100.0 fL   MCH 28.2 26.0 - 34.0 pg   MCHC  30.5 30.0 - 36.0 g/dL   RDW 17.4 (H) 11.5 - 15.5 %   Platelets 263 150 - 400 K/uL    Comment: Performed at [facility], [address], [place], [place] [address]  Comprehensive metabolic panel     Status: Abnormal   Collection Time: 05/25/18  4:40 AM  Result Value Ref Range   Sodium 150 (H) 135 - 145 mmol/L   Potassium 3.2 (L) 3.5 - 5.1 mmol/L   Chloride 113 (H) 98 - 111 mmol/L   CO2 27 22 - 32 mmol/L   Glucose, Bld 161 (H) 70 - 99 mg/dL   BUN 25 (H) 8 - 23 mg/dL   Creatinine, Ser 1.75 (H) 0.61 - 1.24 mg/dL   Calcium 9.2 8.9 - 10.3 mg/dL   Total Protein 6.7 6.5 - 8.1 g/dL   Albumin 3.5 3.5 - 5.0 g/dL   AST 16 15 - 41 U/L   ALT 16 0 - 44 U/L   Alkaline Phosphatase 43 38 - 126 U/L   Total Bilirubin 0.9 0.3 - 1.2 mg/dL   GFR calc non Af Amer 35 (L) >60 mL/min   GFR calc Af Amer 41 (L) >60 mL/min    Comment: (NOTE) The eGFR has been calculated using the CKD EPI equation. This calculation has not been validated in all clinical situations. eGFR's persistently <60 mL/min signify possible Chronic Kidney Disease.    Anion gap 10 5 - 15    Comment: Performed at [facility], [address] [place], [place] [address]  Blood gas, arterial     Status: Abnormal   Collection Time: 05/25/18  5:28 AM  Result Value Ref Range   FIO2 28.00    Delivery systems VENTILATOR    Mode PRESSURE REGULATED VOLUME CONTROL    VT 620 mL   LHR 18 resp/min   Peep/cpap 5.0 cm H20   pH, Arterial 7.480 (H) 7.350 - 7.450   pCO2 arterial 35.8 32.0 - 48.0 mmHg   pO2, Arterial 83.8 83.0 - 108.0 mmHg   Bicarbonate 27.3 20.0 - 28.0 mmol/L   Acid-Base Excess 2.9 (H) 0.0 - 2.0 mmol/L   O2 Saturation 96.3 %   Patient temperature 37.0    Collection site LEFT RADIAL    Drawn by [phone]    Sample type ARTERIAL DRAW    Allens test (pass/fail) PASS PASS    Comment: Performed at [facility], [address] [place], [place] [address]  Glucose, capillary     Status: Abnormal   Collection Time: 05/25/18 12:03 PM   Result Value Ref Range   Glucose-Capillary 160 (H) 70 - 99 mg/dL  Glucose, capillary     Status: Abnormal   Collection Time: 05/25/18  4:04 PM  Result Value Ref Range   Glucose-Capillary 150 (H) 70 - 99 mg/dL    Lipid Panel Recent Labs    05/24/18 0555  CHOL 151  TRIG 150*  HDL 34*  CHOLHDL 4.4  VLDL 30  LDLCALC 87    Studies/Results:  CXR FINDINGS: Opacities at the lung bases are increasing most consistent with pneumonia with possible some atelectasis. No definite effusion is seen. The tip of the endotracheal tube is approximately 5.8 cm above the carina. Mediastinal and hilar contours are unremarkable and the descending thoracic aorta is ectatic.  The heart is within normal limits in size. As noted previously the NG tube may be coiling within a hiatal hernia. Abdominal films would be helpful to assess further.  IMPRESSION: 1. Increasing bibasilar opacities most consistent with pneumonia. Consider follow-up. 2. NG tube appears to be coiling within a hiatal hernia. Abdominal films would be helpful. 3. Tip of endotracheal tube is approximately 5.8 cm above the Carina.         Medications:  Scheduled Meds: . aspirin  300 mg Rectal Daily  . atorvastatin  80 mg Per Tube q1800  . chlorhexidine gluconate (MEDLINE KIT)  15 mL Mouth Rinse BID  . enoxaparin (LOVENOX) injection  80 mg Subcutaneous Q12H  . insulin aspart  0-15 Units Subcutaneous Q4H  . ipratropium-albuterol  3 mL Nebulization Q6H  . mouth rinse  15 mL Mouth Rinse 10 times per day  . metoprolol tartrate  5 mg Intravenous Q6H  . sodium chloride flush  3 mL Intravenous Q12H   Continuous Infusions: . sodium chloride 10 mL/hr at 05/25/18 1152  . ampicillin-sulbactam (UNASYN) IV 3 g (05/25/18 1601)  . dextrose 5 % 1,000 mL with potassium chloride 40 mEq infusion 100 mL/hr at 05/25/18 1400  . famotidine (PEPCID) IV Stopped (05/25/18 1152)   PRN Meds:.sodium chloride, acetaminophen **OR**  acetaminophen, albuterol, docusate, fentaNYL (SUBLIMAZE) injection, fentaNYL (SUBLIMAZE) injection, midazolam, midazolam, ondansetron **OR** ondansetron (ZOFRAN) IV, sodium chloride flush     LOS: 2 days   Sahil Milner A. Merlene Doyle, M.D.  Diplomate, Tax adviser of Psychiatry and Neurology ( Neurology).

## 2018-05-25 NOTE — Progress Notes (Addendum)
PROGRESS NOTE    Keith Doyle  IWO:[id number] DOB: Mar 04, 1936 DOA: 05/11/2018 PCP: Janora Norlander, DO    Brief Narrative:  82 y/o male admitted to the hospital with acute right basal ganglia CVA. Patient became increasingly lethargic and began having fevers. This was followed by development of respiratory distress and he became unresponsive. He was intubated for airway protection and respiratory distress. He was transferred to ICU. I suspect he may have aspirated. He was started on IV antibiotics.   Assessment & Plan:   Principal Problem:   CVA (cerebral vascular accident) [employer]) Active Problems:   Essential hypertension   Peripheral neuropathy   Hyperlipidemia   Hypokalemia   Paroxysmal atrial fibrillation (HCC)   Chronic diastolic congestive heart failure (HCC)   Chronic anticoagulation   Iron deficiency anemia due to chronic blood loss   Acute respiratory failure ([facility])   1. Acute CVA. Patient had presented with acute onset of LLE weakness. MRI brain confirms right basal ganglia infarct. MRA head shows some atherosclerotic disease on the left side. Carotid dopplers did not show any significant stenosis bilaterally. Echo did not show any significant findings. Seen by physical therapy who recommended SNF. He is chronically on xarelto, but this has been switched to lovenox since did not pass his swallow evaluation. Neurology has evaluated the patient. 2. Acute respiratory failure with hypoxia. Likely related to aspiration pneumonia. Patient became unresponsive, febrile and developed respiratory distress. He was intubated for airway protection and respiratory distress. He has been started on intravenous antibiotics. Pulmonology following. Lactic acid initially elevated, but has since trended down.  3. Paroxysmal atrial fibrillation on chronic Xarelto. Patient was initially started on heparin infusion. Per neurology recommendations, he will be transitioned to lovenox therapy. Heart rate  is currently stable on lopressor. Can resume xarelto once acute issues have resolved. 4. HLD. Resume statin.  LDL is above goal considering he has had a stroke. 5. Peripheral neuropathy. Gabapentin was placed on hold due to altered mental status. 6. Bilateral lower extremity edema. Mild elevation of BNP. Echo shows normal EF.  Initially had good urine output with Lasix, but further diuretics will be held due to increasing creatinine 7. AKI. Likely related to diuretics. Patient also received a dose of toradol yesterday for fever. Start IV fluids and follow renal function. 8. Diabetes. Metformin currently on hold. Continue sliding scale insulin   DVT prophylaxis: Full dose Lovenox Code Status: full code Family Communication: discussed with multiple family members at the bedside Disposition Plan: Patient will remain in the ICU at this time for ventilator management   Consultants:   Neurology  Pulmonology  Procedures:  Echo: - Left ventricle: The cavity size was normal. Wall thickness was   increased increased in a pattern of mild to moderate LVH.   Systolic function was normal. The estimated ejection fraction was   in the range of 60% to 65%. Wall motion was normal; there were no   regional wall motion abnormalities. - Aortic valve: Mildly calcified annulus. Trileaflet. - Aortic root: The aortic root was mildly ectatic. - Mitral valve: There was trivial regurgitation. - Left atrium: The atrium was moderately dilated. - Right atrium: The atrium was mildly dilated. Central venous   pressure (est): 8 mm Hg. - Atrial septum: No defect or patent foramen ovale was identified. - Tricuspid valve: There was trivial regurgitation.  - Pericardium, extracardiac: There was no pericardial effusion.  Antimicrobials:   Zosyn 9/18 > 9/19  Unasyn 9/19 >   Subjective:  Unresponsive on ventilator  Objective: Vitals:   05/25/18 0900 05/25/18 0915 05/25/18 0930 05/25/18 0945  BP: (!) 141/80  (!) 146/94 133/82 (!) 143/74  Pulse: (!) 104 98 (!) 105 (!) 105  Resp: _0 Temp: (!) 100.4 F (38 C) (!) 100.4 F (38 C) (!) 100.6 F (38.1 C) (!) 100.6 F (38.1 C)  TempSrc:      SpO2: 97% 97% 97% 97%  Weight:      Height:        Intake/Output Summary (Last 24 hours) at 05/25/2018 1100 Last data filed at 05/25/2018 0900 Gross per 24 hour  Intake 464.46 ml  Output -  Net 464.46 ml   Filed Weights   05/24/18 0359 05/24/18 1852 05/25/18 0500  Weight: 85.1 kg 80.5 kg 82.2 kg    Examination:  General exam: Intubated, on ventilator Respiratory system: Coarse breath sounds at bases. Respiratory effort normal. Cardiovascular system: Irregular rate and rhythm. No murmurs, rubs, gallops. Gastrointestinal system: Abdomen is nondistended, soft and nontender. No organomegaly or masses felt. Normal bowel sounds heard. Central nervous system: Limited exam due to mental status Extremities: 1+ edema bilaterally Skin: No rashes, lesions or ulcers Psychiatry: Unresponsive   Data Reviewed: I have personally reviewed following labs and imaging studies  CBC: Recent Labs  Lab 05/19/18 1003 05/31/2018 1743 05/29/2018 2232 05/25/18 0440  WBC 4.4 6.5 6.3 11.1*  NEUTROABS 2.4 4.3  --   --   HGB 9.0* 10.5* 10.2* 10.9*  HCT 28.0* 33.9* 33.6* 35.7*  MCV 87 91.4 90.6 92.2  PLT 249 260 250 956   Basic Metabolic Panel: Recent Labs  Lab 05/19/18 1003 05/12/2018 1743 05/24/18 0555 05/25/18 0440  NA 143 141 148* 150*  K 4.1 3.3* 3.4* 3.2*  CL 104 108 113* 113*  CO2 _1 GLUCOSE 99 111* 154* 161*  BUN _2 25*  CREATININE 1.26 1.11 1.02 1.75*  CALCIUM 9.2 9.6 9.6 9.2  MG  --   --  2.0  --    GFR: Estimated Creatinine Clearance: 37 mL/min (A) (by C-G formula based on SCr of 1.75 mg/dL (H)). Liver Function Tests: Recent Labs  Lab 05/15/2018 1743 05/25/18 0440  AST 18 16  ALT 17 16  ALKPHOS 51 43  BILITOT 0.5 0.9  PROT 7.2 6.7  ALBUMIN 4.0 3.5   No results for  input(s): LIPASE, AMYLASE in the last 168 hours. Recent Labs  Lab 05/24/18 1854  AMMONIA 33   Coagulation Profile: Recent Labs  Lab 05/14/2018 1743  INR 1.10   Cardiac Enzymes: Recent Labs  Lab 06/02/2018 1743  TROPONINI <0.03   BNP (last 3 results) No results for input(s): PROBNP in the last 8760 hours. HbA1C: Recent Labs    05/07/2018 2232  HGBA1C 5.5   CBG: No results for input(s): GLUCAP in the last 168 hours. Lipid Profile: Recent Labs    05/24/18 0555  CHOL 151  HDL 34*  LDLCALC 87  TRIG 150*  CHOLHDL 4.4   Thyroid Function Tests: Recent Labs    05/10/2018 1743  TSH 3.667   Anemia Panel: No results for input(s): VITAMINB12, FOLATE, FERRITIN, TIBC, IRON, RETICCTPCT in the last 72 hours. Sepsis Labs: Recent Labs  Lab 05/24/18 1854 05/24/18 2211  LATICACIDVEN 2.1* 1.5    Recent Results (from the past 240 hour(s))  Culture, blood (routine x 2)     Status: None (Preliminary result)   Collection Time: 05/24/18  6:54 PM  Result  Value Ref Range Status   Specimen Description BLOOD RIGHT HAND  Final   Special Requests   Final    BOTTLES DRAWN AEROBIC AND ANAEROBIC Blood Culture adequate volume   Culture   Final    NO GROWTH < 12 HOURS Performed at [facility], [address], [place], [place] [address]    Report Status PENDING  Incomplete  Culture, blood (routine x 2)     Status: None (Preliminary result)   Collection Time: 05/24/18  7:03 PM  Result Value Ref Range Status   Specimen Description BLOOD LEFT HAND  Final   Special Requests   Final    BOTTLES DRAWN AEROBIC AND ANAEROBIC Blood Culture adequate volume   Culture   Final    NO GROWTH < 12 HOURS Performed at [facility], [address], [place], [place] [address]    Report Status PENDING  Incomplete         Radiology Studies: Dg Chest 2 View  Result Date: 05/10/2018 CLINICAL DATA:  Altered mental status EXAM: CHEST - 2 VIEW COMPARISON:  05/17/2017, CT 12/01/2010. FINDINGS: Suspected  tiny left effusionSlightly enlarged cardiomediastinal silhouette with aortic atherosclerosis. No pneumothorax. Moderate to large hiatal hernia. Degenerative changes of the spine IMPRESSION: Mild cardiomegaly with suspected tiny left effusion. Moderate to large hiatal hernia. Electronically Signed   By: Donavan Foil M.D.   On: 05/18/2018 18:59   Ct Head Wo Contrast  Result Date: 05/16/2018 CLINICAL DATA:  Altered LOC EXAM: CT HEAD WITHOUT CONTRAST TECHNIQUE: Contiguous axial images were obtained from the base of the skull through the vertex without intravenous contrast. COMPARISON:  08/11/2013 head CT FINDINGS: Brain: No hemorrhage is visualized. Hypodensity within the right basal ganglia, new as compared with 2014 head CT, consistent with age indeterminate lacunar infarct. 5 mm partially calcified left frontal extra-axial mass likely a small meningioma. Stable calcified extra-axial 15 mm mass in the right middle cranial fossa consistent with meningioma. Moderate atrophy. Moderate small vessel ischemic changes of the white matter. Prominent ventricles are felt related to atrophy. Vascular: No hyperdense vessels. Vertebral and carotid vascular calcification Skull: Normal. Negative for fracture or focal lesion. Sinuses/Orbits: No acute abnormality. Mucosal thickening in the ethmoid sinuses. Small osteoma left ethmoid sinus Other: None IMPRESSION: 1. Hypodensity within the right basal ganglia consistent with age indeterminate lacunar infarct, new since 2014 2. Atrophy with small vessel ischemic changes of the white matter 3. Stable right convexity partially calcified meningioma. Additional small left frontal convexity partially calcified extra-axial mass, likely small meningioma. Electronically Signed   By: Donavan Foil M.D.   On: 05/28/2018 19:05   Mr Jodene Nam Head Wo Contrast  Result Date: 05/24/2018 CLINICAL DATA:  Altered mental status and left lower extremity weakness. EXAM: MRI HEAD WITHOUT CONTRAST MRA HEAD  WITHOUT CONTRAST TECHNIQUE: Multiplanar, multiecho pulse sequences of the brain and surrounding structures were obtained without intravenous contrast. Angiographic images of the head were obtained using MRA technique without contrast. COMPARISON:  Head CT 05/30/2018.  Head MRI/MRA 07/28/2010. FINDINGS: MRI HEAD FINDINGS Brain: There is a 3 cm acute infarct involving the right lentiform and caudate nuclei. No intracranial hemorrhage, midline shift, or extra-axial fluid collection is identified. Patchy cerebral white matter T2 hyperintensities have mildly progressed from 2011 and are nonspecific but compatible with moderate chronic small vessel ischemic disease. There is moderate cerebral atrophy. A 1.3 cm calcified extra-axial mass compatible with a meningioma at the level of the right sylvian fissure is unchanged from 2011 and does not result  in significant mass effect or brain edema. 5 mm focus of extra-axial calcification over the left frontal convexity may also represent an incidental meningioma without mass effect or edema. Vascular: Major intracranial vascular flow voids are preserved. Skull and upper cervical spine: Unremarkable bone marrow signal. Sinuses/Orbits: Bilateral cataract extraction. Trace bilateral mastoid effusions. Clear paranasal sinuses. Other: None. MRA HEAD FINDINGS The visualized distal vertebral arteries are patent to the basilar with mild distal V4 stenosis noted on the left. There is irregularity of the proximal right V4 segment without significant stenosis. Patent left PICA, bilateral AICA, and bilateral SCA origins are identified. The right PICA origin may be slightly proximal to the imaging volume. The basilar artery is widely patent. There is a small left posterior communicating artery. The PCAs are patent proximally with a new severe proximal left P1 stenosis. There is signal loss in the left greater than right P2 segments which is felt to be at least partly artifactual due to the  course of the vessels with presumed artifactual left P2 signal loss also present on the prior MRA. No significant P1 or proximal P2 stenosis is evident on the right. The internal carotid arteries are widely patent from skull base to carotid termini. ACAs and MCAs are patent without evidence of proximal branch occlusion or flow limiting proximal stenosis. No aneurysm is identified. IMPRESSION: 1. Acute right basal ganglia infarct. 2. Moderate chronic small vessel ischemic disease and cerebral atrophy. 3. Incidental small meningiomas without mass effect or edema. 4. Patent circle of Willis without large vessel occlusion. 5. New severe left P1 stenosis and mild distal left V4 stenosis. Electronically Signed   By: Logan Bores M.D.   On: 05/24/2018 10:28   Mr Brain Wo Contrast  Result Date: 05/24/2018 CLINICAL DATA:  Altered mental status and left lower extremity weakness. EXAM: MRI HEAD WITHOUT CONTRAST MRA HEAD WITHOUT CONTRAST TECHNIQUE: Multiplanar, multiecho pulse sequences of the brain and surrounding structures were obtained without intravenous contrast. Angiographic images of the head were obtained using MRA technique without contrast. COMPARISON:  Head CT 05/14/2018.  Head MRI/MRA 07/28/2010. FINDINGS: MRI HEAD FINDINGS Brain: There is a 3 cm acute infarct involving the right lentiform and caudate nuclei. No intracranial hemorrhage, midline shift, or extra-axial fluid collection is identified. Patchy cerebral white matter T2 hyperintensities have mildly progressed from 2011 and are nonspecific but compatible with moderate chronic small vessel ischemic disease. There is moderate cerebral atrophy. A 1.3 cm calcified extra-axial mass compatible with a meningioma at the level of the right sylvian fissure is unchanged from 2011 and does not result in significant mass effect or brain edema. 5 mm focus of extra-axial calcification over the left frontal convexity may also represent an incidental meningioma without  mass effect or edema. Vascular: Major intracranial vascular flow voids are preserved. Skull and upper cervical spine: Unremarkable bone marrow signal. Sinuses/Orbits: Bilateral cataract extraction. Trace bilateral mastoid effusions. Clear paranasal sinuses. Other: None. MRA HEAD FINDINGS The visualized distal vertebral arteries are patent to the basilar with mild distal V4 stenosis noted on the left. There is irregularity of the proximal right V4 segment without significant stenosis. Patent left PICA, bilateral AICA, and bilateral SCA origins are identified. The right PICA origin may be slightly proximal to the imaging volume. The basilar artery is widely patent. There is a small left posterior communicating artery. The PCAs are patent proximally with a new severe proximal left P1 stenosis. There is signal loss in the left greater than right P2 segments which is  felt to be at least partly artifactual due to the course of the vessels with presumed artifactual left P2 signal loss also present on the prior MRA. No significant P1 or proximal P2 stenosis is evident on the right. The internal carotid arteries are widely patent from skull base to carotid termini. ACAs and MCAs are patent without evidence of proximal branch occlusion or flow limiting proximal stenosis. No aneurysm is identified. IMPRESSION: 1. Acute right basal ganglia infarct. 2. Moderate chronic small vessel ischemic disease and cerebral atrophy. 3. Incidental small meningiomas without mass effect or edema. 4. Patent circle of Willis without large vessel occlusion. 5. New severe left P1 stenosis and mild distal left V4 stenosis. Electronically Signed   By: Logan Bores M.D.   On: 05/24/2018 10:28   US Carotid Bilateral (at [facility] Only)  Result Date: 05/24/2018 CLINICAL DATA:  82 year old male with a history weakness. Cardiovascular risk factors include hypertension, stroke/TIA, coronary disease, hyperlipidemia, diabetes EXAM: BILATERAL CAROTID  DUPLEX ULTRASOUND TECHNIQUE: Pearline Cables scale imaging, color Doppler and duplex ultrasound were performed of bilateral carotid and vertebral arteries in the neck. COMPARISON:  None. FINDINGS: Criteria: Quantification of carotid stenosis is based on velocity parameters that correlate the residual internal carotid diameter with NASCET-based stenosis levels, using the diameter of the distal internal carotid lumen as the denominator for stenosis measurement. The following velocity measurements were obtained: RIGHT ICA:  Systolic 57 cm/sec, Diastolic 17 cm/sec CCA:  96 cm/sec SYSTOLIC ICA/CCA RATIO:  0.6 ECA:  87 cm/sec LEFT ICA:  Systolic 60 cm/sec, Diastolic 16 cm/sec CCA:  94 cm/sec SYSTOLIC ICA/CCA RATIO:  0.6 ECA:  76 cm/sec Right Brachial SBP: Not acquired Left Brachial SBP: Not acquired RIGHT CAROTID ARTERY: No significant calcifications of the right common carotid artery. Intermediate waveform maintained. Heterogeneous and partially calcified plaque at the right carotid bifurcation. No significant lumen shadowing. Low resistance waveform of the right ICA. No significant tortuosity. RIGHT VERTEBRAL ARTERY: Antegrade flow with low resistance waveform. LEFT CAROTID ARTERY: No significant calcifications of the left common carotid artery. Intermediate waveform maintained. Heterogeneous and partially calcified plaque at the left carotid bifurcation without significant lumen shadowing. Low resistance waveform of the left ICA. No significant tortuosity. LEFT VERTEBRAL ARTERY:  Antegrade flow with low resistance waveform. IMPRESSION: Color duplex indicates minimal heterogeneous and calcified plaque, with no hemodynamically significant stenosis by duplex criteria in the extracranial cerebrovascular circulation. Signed, Dulcy Fanny. Dellia Nims, RPVI [facility] Electronically Signed   By: Corrie Mckusick D.O.   On: 05/24/2018 10:46   Portable Chest Xray  Result Date:  05/25/2018 CLINICAL DATA:  Respiratory failure, atrial fibrillation EXAM: PORTABLE CHEST 1 VIEW COMPARISON:  Portable chest x-ray of 05/24/2018 and 05/08/2018 FINDINGS: Opacities at the lung bases are increasing most consistent with pneumonia with possible some atelectasis. No definite effusion is seen. The tip of the endotracheal tube is approximately 5.8 cm above the carina. Mediastinal and hilar contours are unremarkable and the descending thoracic aorta is ectatic. The heart is within normal limits in size. As noted previously the NG tube may be coiling within a hiatal hernia. Abdominal films would be helpful to assess further. IMPRESSION: 1. Increasing bibasilar opacities most consistent with pneumonia. Consider follow-up. 2. NG tube appears to be coiling within a hiatal hernia. Abdominal films would be helpful. 3. Tip of endotracheal tube is approximately 5.8 cm above the carina. Electronically Signed   By: Ivar Drape M.D.   On: 05/25/2018 09:24  Dg Chest Port 1 View  Result Date: 05/24/2018 CLINICAL DATA:  hypoxia EXAM: PORTABLE CHEST 1 VIEW COMPARISON:  1 day prior FINDINGS: Endotracheal tube terminates 5.8 cm above carina.Nasogastric tube is difficult to follow distally. Favored to be looped in a hiatal hernia. Mild cardiomegaly. No pleural effusion or pneumothorax. Mildly worsened left base aeration. IMPRESSION: Endotracheal tube appropriately positioned. Nasogastric tube is likely looped within a hiatal hernia. It is difficult to follow distally. Consider dedicated abdominal radiographs either after repositioning or more acutely. Worsened left-sided aeration which could represent progressive atelectasis or developing infection/aspiration. Electronically Signed   By: Abigail Miyamoto M.D.   On: 05/24/2018 19:09        Scheduled Meds: . aspirin  300 mg Rectal Daily  . atorvastatin  80 mg Per Tube q1800  . chlorhexidine gluconate (MEDLINE KIT)  15 mL Mouth Rinse BID  . ipratropium-albuterol  3  mL Nebulization Q6H  . mouth rinse  15 mL Mouth Rinse 10 times per day  . metoprolol tartrate  5 mg Intravenous Q6H  . sodium chloride flush  3 mL Intravenous Q12H   Continuous Infusions: . sodium chloride Stopped (05/25/18 0548)  . ampicillin-sulbactam (UNASYN) IV    . dextrose 5 % 1,000 mL with potassium chloride 40 mEq infusion    . famotidine (PEPCID) IV Stopped (05/24/18 2244)     LOS: 2 days    Critical care time spent: 45mns.  Patient remains critically ill, while intubated on mechanical ventilation.  He has ongoing fever due to pneumonia.  He needs close monitoring in the ICU.    JKathie Dike MD [facility] Pager [phone] If 7PM-7AM, please contact night-coverage [email or web address] Password [email or web address] 05/25/2018, 11:00 AM

## 2018-05-25 NOTE — Progress Notes (Signed)
Pharmacy Antibiotic Note  Keith Doyle is a 82 y.o. male admitted on 05/07/2018 with bacteremia.  Pharmacy has been consulted for Vancomycin dosing.  Plan: Vancomycin 1gm IV every 24 hours.  Goal trough 15-20 mcg/mL.  Monitor labs, micro and vitals.  Trough at steady state if therapy to continue.  Height: 6' (182.9 cm) Weight: 181 lb 3.5 oz (82.2 kg) IBW/kg (Calculated) : 77.6  Temp (24hrs), Avg:100.9 F (38.3 C), Min:98.7 F (37.1 C), Max:102.9 F (39.4 C)  Recent Labs  Lab 05/19/18 1003 05/11/2018 1743 05/19/2018 2232 05/24/18 0555 05/24/18 1854 05/24/18 2211 05/25/18 0440  WBC 4.4 6.5 6.3  --   --   --  11.1*  CREATININE 1.26 1.11  --  1.02  --   --  1.75*  LATICACIDVEN  --   --   --   --  2.1* 1.5  --     Estimated Creatinine Clearance: 37 mL/min (A) (by C-G formula based on SCr of 1.75 mg/dL (H)).    No Known Allergies  Antimicrobials this admission: Vancomycin 9/19 >>  Zosyn 9/18 > 9/19 Unasyn 9/19 >  Dose adjustments this admission: n/a   Microbiology results: 9/18 BCx: 1 of 2 Methicillin Resistant Staph species (not aureus).  UCx:    Sputum:    MRSA PCR:   Thank you for allowing pharmacy to be a part of this patient's care.  Pricilla Larsson 05/25/2018 8:36 PM

## 2018-05-25 NOTE — Progress Notes (Addendum)
[place] for Heparin Indication: atrial fibrillation and Hx DVT, New CVA?  No Known Allergies  Patient Measurements: Height: 6' (182.9 cm) Weight: 181 lb 3.5 oz (82.2 kg) IBW/kg (Calculated) : 77.6 HEPARIN DW (KG): 80.5   Vital Signs: Temp: 100 F (37.8 C) (09/19 0700) Temp Source: Axillary (09/19 0500) BP: 135/91 (09/19 0700) Pulse Rate: 90 (09/19 0700)  Labs: Recent Labs    05/16/2018 1743  05/07/2018 2232 05/24/18 0555 05/24/18 1428 05/24/18 2211 05/25/18 0440  HGB 10.5*  --  10.2*  --   --   --  10.9*  HCT 33.9*  --  33.6*  --   --   --  35.7*  PLT 260  --  250  --   --   --  263  APTT  --   --  31 33  --   --   --   LABPROT 14.1  --   --   --   --   --   --   INR 1.10  --   --   --   --   --   --   HEPARINUNFRC  --    < > 0.16* 0.22* <0.10* 0.29* 0.27*  CREATININE 1.11  --   --  1.02  --   --  1.75*  TROPONINI <0.03  --   --   --   --   --   --    < > = values in this interval not displayed.   Estimated Creatinine Clearance: 37 mL/min (A) (by C-G formula based on SCr of 1.75 mg/dL (H)).     Assessment: 82 yo man on xarelto PTA.   Xarelto likely not having much effect on heparin level so will use heparin levels to guide therapy.  Heparin level 0.27    Goal of Therapy:  Heparin level 0.3-0.7 units/ml  Monitor platelets by anticoagulation protocol: Yes   Plan:  Increase heparin drip to 1400 units/hr Check anti Xa  daily while on heparin Continue to monitor H&H and platelets  Ramond Craver 05/25/2018,7:43 AM

## 2018-05-25 NOTE — Progress Notes (Signed)
OT Cancellation Note  Patient Details Name: Keith Doyle MRN: [id number] DOB: 04/05/1936   Cancelled Treatment:    Reason Eval/Treat Not Completed: Medical issues which prohibited therapy. Medical issues which prohibited therapy.  Patient transferred to a higher level of care and will need new OT consult resume therapy when patient is medically stable.  Thank you.   Ailene Ravel, OTR/L,CBIS  [phone]  05/25/2018, 1:50 PM

## 2018-05-26 ENCOUNTER — Inpatient Hospital Stay (HOSPITAL_COMMUNITY): Payer: Medicare HMO

## 2018-05-26 ENCOUNTER — Ambulatory Visit: Payer: Medicare HMO | Admitting: Family

## 2018-05-26 ENCOUNTER — Encounter (HOSPITAL_COMMUNITY): Payer: Self-pay | Admitting: Radiology

## 2018-05-26 ENCOUNTER — Other Ambulatory Visit: Payer: Self-pay

## 2018-05-26 DIAGNOSIS — L8990 Pressure ulcer of unspecified site, unspecified stage: Secondary | ICD-10-CM

## 2018-05-26 DIAGNOSIS — E440 Moderate protein-calorie malnutrition: Secondary | ICD-10-CM

## 2018-05-26 LAB — BLOOD GAS, ARTERIAL
ACID-BASE EXCESS: 0.9 mmol/L (ref 0.0–2.0)
BICARBONATE: 25.4 mmol/L (ref 20.0–28.0)
Drawn by: 22171
FIO2: 28
LHR: 18 {breaths}/min
O2 SAT: 98 %
PCO2 ART: 37 mmHg (ref 32.0–48.0)
PEEP: 5 cmH2O
PH ART: 7.439 (ref 7.350–7.450)
VT: 620 mL
pO2, Arterial: 108 mmHg (ref 83.0–108.0)

## 2018-05-26 LAB — CBC
HEMATOCRIT: 35.7 % — AB (ref 39.0–52.0)
HEMOGLOBIN: 10.9 g/dL — AB (ref 13.0–17.0)
MCH: 28.5 pg (ref 26.0–34.0)
MCHC: 30.5 g/dL (ref 30.0–36.0)
MCV: 93.2 fL (ref 78.0–100.0)
Platelets: 257 10*3/uL (ref 150–400)
RBC: 3.83 MIL/uL — AB (ref 4.22–5.81)
RDW: 17.3 % — AB (ref 11.5–15.5)
WBC: 12.2 10*3/uL — AB (ref 4.0–10.5)

## 2018-05-26 LAB — GLUCOSE, CAPILLARY
GLUCOSE-CAPILLARY: 120 mg/dL — AB (ref 70–99)
GLUCOSE-CAPILLARY: 151 mg/dL — AB (ref 70–99)
Glucose-Capillary: 121 mg/dL — ABNORMAL HIGH (ref 70–99)
Glucose-Capillary: 141 mg/dL — ABNORMAL HIGH (ref 70–99)
Glucose-Capillary: 152 mg/dL — ABNORMAL HIGH (ref 70–99)
Glucose-Capillary: 152 mg/dL — ABNORMAL HIGH (ref 70–99)

## 2018-05-26 LAB — MRSA PCR SCREENING: MRSA by PCR: NEGATIVE

## 2018-05-26 LAB — BASIC METABOLIC PANEL
ANION GAP: 10 (ref 5–15)
BUN: 32 mg/dL — ABNORMAL HIGH (ref 8–23)
CHLORIDE: 111 mmol/L (ref 98–111)
CO2: 27 mmol/L (ref 22–32)
Calcium: 9 mg/dL (ref 8.9–10.3)
Creatinine, Ser: 1.78 mg/dL — ABNORMAL HIGH (ref 0.61–1.24)
GFR calc Af Amer: 40 mL/min — ABNORMAL LOW (ref >60)
GFR calc non Af Amer: 34 mL/min — ABNORMAL LOW (ref >60)
Glucose, Bld: 150 mg/dL — ABNORMAL HIGH (ref 70–99)
POTASSIUM: 3.6 mmol/L (ref 3.5–5.1)
SODIUM: 148 mmol/L — AB (ref 135–145)

## 2018-05-26 LAB — MAGNESIUM
MAGNESIUM: 2.2 mg/dL (ref 1.7–2.4)
MAGNESIUM: 2.1 mg/dL (ref 1.7–2.4)

## 2018-05-26 LAB — PHOSPHORUS
PHOSPHORUS: 3.4 mg/dL (ref 2.5–4.6)
PHOSPHORUS: 2.2 mg/dL — AB (ref 2.5–4.6)

## 2018-05-26 MED ORDER — PRO-STAT SUGAR FREE PO LIQD: 30.0000 mL | Freq: Two times a day (BID) | ORAL | Status: DC

## 2018-05-26 MED ORDER — VITAL AF 1.2 CAL PO LIQD
1000.0000 mL | ORAL | Status: DC
  Filled 2018-05-26 (×9): qty 1000

## 2018-05-26 MED ORDER — PRO-STAT SUGAR FREE PO LIQD: 30.0000 mL | Freq: Every day | ORAL | Status: DC

## 2018-05-26 MED ORDER — FAMOTIDINE IN NACL 20-0.9 MG/50ML-% IV SOLN
20.0000 mg | INTRAVENOUS | Status: DC
  Administered 2018-05-27 – 2018-06-05 (×10): 20 mg via INTRAVENOUS
  Filled 2018-05-26 (×10): qty 50

## 2018-05-26 MED ORDER — VITAL HIGH PROTEIN PO LIQD
1000.0000 mL | ORAL | Status: DC
  Filled 2018-05-26 (×4): qty 1000

## 2018-05-26 MED FILL — Medication: Qty: 1 | Status: AC

## 2018-05-26 NOTE — Patient Outreach (Signed)
[facility]) Care Management  05/26/2018  Keith Doyle 17-Feb-1936 [id number]  TELEPHONE SCREENING Referral date: 05/19/18 Referral source: primary MD  Referral reason: medication management, diabetes, heart failure management/ education  Insurance: [employer] Attempt #1  Telephone call to patient regarding referral. Unable to reach patient. HIPAA compliant voice message left with call back phone number.   PLAN: RNCM will attempt 2nd telephone call to patient within 4 business days. RNCM will send outreach letter.   Quinn Plowman RN,BSN, [place] Telephonic  [phone]

## 2018-05-26 NOTE — Progress Notes (Signed)
Initial Nutrition Assessment  DOCUMENTATION CODES:  Non-severe (moderate) malnutrition in context of acute illness/injury  INTERVENTION:  Initiate TF via OGT with Vital 1.2 at goal rate of 60 ml/h (1440 ml per day) and Prostat 30 ml Q24 to provide 1828 kcals, 123 gm protein, 1168 ml free water daily.  NUTRITION DIAGNOSIS:  Moderate Malnutrition related to acute illness, lethargy/confusion as evidenced by mild/muscle fat loss and loss of >7.5% in past 3 months (~10%)  GOAL:  Patient will meet greater than or equal to 90% of their needs  MONITOR:  Diet advancement, Vent status, Labs, Weight trends, TF tolerance  REASON FOR ASSESSMENT:  Consult Enteral/tube feeding initiation and management  ASSESSMENT:  82 y/o male PMHx afib, HTN/HLD, HF, DM2. Brought to ED w/ worsening BLE, AMS and weakness. More acutely, developed inability to move LLE. On workup, found to have suffered L CVA. 9/18, Pt failed swallow eval and later that evening, developed SOB, lethargy, tachycardia. Became obtunded and intubated for airway protection. Felt to have aspirated  9/18: intubated.  9/19 ogt removed as it was felt located in hiatal hernia. Replacement attempts also coiled into hernia. No access present at this time.   Pt NPO since admit.   There is no family at bedside. Physical exam shows mild muscle/fat depletions.   Per MD discussion, patient will not be able to be weaned off vent today. RN to retry Ogt placement and if unable, may need PICC w/ TPN. At time of note, ogt just replaced by RN and xray pending.  Per chart, the pt's ubw appears to be 205-220 lbs, which he has maintained for the bast few years up until June of this year. He is substantially lower than this at the moment. Dry weight this admission is 177.5 lbs. Meets criteria for malnutrition.   With mild fat/muscle depletions, meets criteria for malnutrition in acute context.   Spiked high fever overnight, but has subsided.   Patient is  currently intubated on ventilator support MV: 10.6 L/min Temp (24hrs), Avg:101 F (38.3 C), Min:98.6 F (37 C), Max:102.9 F (39.4 C) Propofol: None  Labs: BGs:120-160, BUN/Creat: 32/1.78, WBC:12.2, Na:148, A1C 5.5 Meds: Insulin, IVF, IV abx, IV h2ra,   Sedation: Versed/fentanyl  Recent Labs  Lab 05/24/18 0555 05/25/18 0440 05/26/18 0358  NA 148* 150* 148*  K 3.4* 3.2* 3.6  CL 113* 113* 111  CO2 27 27 27   BUN 13 25* 32*  CREATININE 1.02 1.75* 1.78*  CALCIUM 9.6 9.2 9.0  MG 2.0  --  2.2  PHOS  --   --  3.4  GLUCOSE 154* 161* 150*   NUTRITION - FOCUSED PHYSICAL EXAM:   Most Recent Value  Orbital Region  Mild depletion  Upper Arm Region  No depletion  Thoracic and Lumbar Region  Moderate depletion  Buccal Region  No depletion  Temple Region  Moderate depletion  Clavicle Bone Region  Mild depletion  Clavicle and Acromion Bone Region  Mild depletion  Scapular Bone Region  Unable to assess  Dorsal Hand  Unable to assess  Patellar Region  No depletion  Anterior Thigh Region  No depletion  Posterior Calf Region  No depletion  Edema (RD Assessment)  None  Hair  Reviewed  Eyes  Reviewed  Mouth  Reviewed  Skin  Reviewed  Nails  Reviewed     Diet Order:   Diet Order            Diet NPO time specified  Diet effective now  EDUCATION NEEDS:  No education needs have been identified at this time  Skin:  PU stage 1 to sacrum  Last BM:  Unknown  Height:  Ht Readings from Last 1 Encounters:  05/24/18 6' (1.829 m)   Weight:  Wt Readings from Last 1 Encounters:  05/26/18 83.4 kg   Wt Readings from Last 10 Encounters:  05/26/18 83.4 kg  05/19/18 93 kg  05/12/18 93.4 kg  04/10/18 87.4 kg  02/20/18 90.7 kg  12/22/17 95.3 kg  12/07/17 93 kg  11/21/17 96.1 kg  10/31/17 94.3 kg  08/22/17 93.6 kg   Ideal Body Weight:  80.91 kg  BMI:  Body mass index is 24.94 kg/m.  Estimated Nutritional Needs:  Kcal:  3790 (psu 2003b) Protein:  117-133g Pro  (1.4-1.6 g/kg bw) Fluid:  Per MD goals  Burtis Junes RD, LDN, CNSC Clinical Nutrition Available Tues-Sat via Pager: [phone] 05/26/2018 10:40 AM

## 2018-05-26 NOTE — Progress Notes (Signed)
Subjective: He remains intubated and on the ventilator.  His family says he was a little more responsive yesterday evening would open his eyes and then he had some episodes of being somewhat agitated last night and he is now on fentanyl and Versed as needed.  This morning he is somnolent and not really responsive.  Objective: Vital signs in last 24 hours: Temp:  [98.6 F (37 C)-102.9 F (39.4 C)] 98.6 F (37 C) (09/20 0730) Pulse Rate:  [26-105] 72 (09/20 0730) Resp:  [9-28] 18 (09/20 0730) BP: (107-183)/(65-105) 159/100 (09/20 0600) SpO2:  [96 %-100 %] 100 % (09/20 0730) FiO2 (%):  [28 %] 28 % (09/20 0301) Weight:  [83.4 kg] 83.4 kg (09/20 0400) Weight change: 2.9 kg Last BM Date: (unknown)  Intake/Output from previous day: 09/19 0701 - 09/20 0700 In: 1904.5 [I.V.:1564; IV Piggyback:340.5] Out: 1300 [Urine:1300]  PHYSICAL EXAM General appearance: Intubated sedated on mechanical ventilation Resp: rhonchi bilaterally Cardio: irregularly irregular rhythm GI: soft, non-tender; bowel sounds normal; no masses,  no organomegaly Extremities: extremities normal, atraumatic, no cyanosis or edema  Lab Results:  Results for orders placed or performed during the hospital encounter of 05/24/2018 (from the past 48 hour(s))  Heparin level (unfractionated)     Status: Abnormal   Collection Time: 05/24/18  2:28 PM  Result Value Ref Range   Heparin Unfractionated <0.10 (L) 0.30 - 0.70 IU/mL    Comment: (NOTE) If heparin results are below expected values, and patient dosage has  been confirmed, suggest follow up testing of antithrombin III levels. Performed at [facility], [address], [place], [place] [address]   Culture, blood (routine x 2)     Status: None (Preliminary result)   Collection Time: 05/24/18  6:54 PM  Result Value Ref Range   Specimen Description      BLOOD RIGHT HAND Performed at [facility], [address], [place], [place] [address]    Special Requests      BOTTLES  DRAWN AEROBIC AND ANAEROBIC Blood Culture adequate volume Performed at [facility], [address], [place], [place] [address]    Culture  Setup Time      GRAM POSITIVE COCCI ANAEROBIC BOTTLE GRAM STAINED RESULT CALLED TO/READ BACK FROM MISTY SMART AT 1300  BY HFLYNT 05/25/18 CRITICAL RESULT CALLED TO, READ BACK BY AND VERIFIED WITH: RN Wallace Keller [phone] MLM Performed at [facility], [place] [address] [place], [place] [address]    Culture GRAM POSITIVE COCCI    Report Status PENDING   Lactic acid, plasma     Status: Abnormal   Collection Time: 05/24/18  6:54 PM  Result Value Ref Range   Lactic Acid, Venous 2.1 (HH) 0.5 - 1.9 mmol/L    Comment: CRITICAL RESULT CALLED TO, READ BACK BY AND VERIFIED WITH: WADE,S ON 05/24/18 AT 2020 BY LOY,C Performed at [facility], [address], [place], [place] [address]   Ammonia     Status: None   Collection Time: 05/24/18  6:54 PM  Result Value Ref Range   Ammonia 33 9 - 35 umol/L    Comment: Performed at [facility], [address], [place], [place] [address]  Blood Culture ID Panel (Reflexed)     Status: Abnormal   Collection Time: 05/24/18  6:54 PM  Result Value Ref Range   Enterococcus species NOT DETECTED NOT DETECTED   Listeria monocytogenes NOT DETECTED NOT DETECTED   Staphylococcus species DETECTED (A) NOT DETECTED    Comment: Methicillin (oxacillin)  resistant coagulase negative staphylococcus. Possible blood culture contaminant (unless isolated from more than one blood culture draw or clinical case suggests pathogenicity). No antibiotic treatment is indicated for blood  culture contaminants. CRITICAL RESULT CALLED TO, READ BACK BY AND VERIFIED WITH: RN E MURPHY [phone] 1824 MLM    Staphylococcus aureus NOT DETECTED NOT DETECTED   Methicillin resistance DETECTED (A) NOT DETECTED    Comment: CRITICAL RESULT CALLED TO, READ BACK BY AND VERIFIED WITH: RN E MURPHY [phone] 1824 MLM    Streptococcus species NOT DETECTED NOT DETECTED    Streptococcus agalactiae NOT DETECTED NOT DETECTED   Streptococcus pneumoniae NOT DETECTED NOT DETECTED   Streptococcus pyogenes NOT DETECTED NOT DETECTED   Acinetobacter baumannii NOT DETECTED NOT DETECTED   Enterobacteriaceae species NOT DETECTED NOT DETECTED   Enterobacter cloacae complex NOT DETECTED NOT DETECTED   Escherichia coli NOT DETECTED NOT DETECTED   Klebsiella oxytoca NOT DETECTED NOT DETECTED   Klebsiella pneumoniae NOT DETECTED NOT DETECTED   Proteus species NOT DETECTED NOT DETECTED   Serratia marcescens NOT DETECTED NOT DETECTED   Haemophilus influenzae NOT DETECTED NOT DETECTED   Neisseria meningitidis NOT DETECTED NOT DETECTED   Pseudomonas aeruginosa NOT DETECTED NOT DETECTED   Candida albicans NOT DETECTED NOT DETECTED   Candida glabrata NOT DETECTED NOT DETECTED   Candida krusei NOT DETECTED NOT DETECTED   Candida parapsilosis NOT DETECTED NOT DETECTED   Candida tropicalis NOT DETECTED NOT DETECTED    Comment: Performed at [place] [address], [place], [place] [address]  Culture, blood (routine x 2)     Status: None (Preliminary result)   Collection Time: 05/24/18  7:03 PM  Result Value Ref Range   Specimen Description BLOOD LEFT HAND    Special Requests      BOTTLES DRAWN AEROBIC AND ANAEROBIC Blood Culture adequate volume   Culture      NO GROWTH 2 DAYS Performed at [facility], [address] [place], [place] [address]    Report Status PENDING   Blood gas, arterial     Status: Abnormal   Collection Time: 05/24/18  8:31 PM  Result Value Ref Range   FIO2 100.00    Delivery systems VENTILATOR    Mode PRESSURE REGULATED VOLUME CONTROL    VT 620 mL   LHR 18 resp/min   Peep/cpap 5.0 cm H20   pH, Arterial 7.477 (H) 7.350 - 7.450   pCO2 arterial 35.9 32.0 - 48.0 mmHg   pO2, Arterial 346 (H) 83.0 - 108.0 mmHg   Bicarbonate RBV 20.0 - 28.0 mmol/L    Comment: UNABLE TO READ DUE TO CRITICALLY HIGH sO2  JAMES DANIELS, RN GIVEN RESULTS OF  CRITICAL SO2    Acid-Base Excess 2.8 (H) 0.0 - 2.0 mmol/L   Patient temperature 38.3    Collection site LEFT RADIAL    Drawn by [phone]    Sample type ARTERIAL DRAW    Allens test (pass/fail) PASS PASS    Comment: Performed at [facility], [address], [place], [place] [address]  Heparin level (unfractionated)     Status: Abnormal   Collection Time: 05/24/18 10:11 PM  Result Value Ref Range   Heparin Unfractionated 0.29 (L) 0.30 - 0.70 IU/mL    Comment: (NOTE) If heparin results are below expected values, and patient dosage has  been confirmed, suggest follow up testing of antithrombin III levels. Performed at [facility], [address] [place], [place] [address]   Lactic acid, plasma  Status: None   Collection Time: 05/24/18 10:11 PM  Result Value Ref Range   Lactic Acid, Venous 1.5 0.5 - 1.9 mmol/L    Comment: Performed at [facility], [address], [place], [place] [address]  Blood gas, arterial     Status: Abnormal   Collection Time: 05/24/18 10:11 PM  Result Value Ref Range   FIO2 28.00    Delivery systems VENTILATOR    Mode PRESSURE REGULATED VOLUME CONTROL    VT 620 mL   LHR 18 resp/min   Peep/cpap 5.0 cm H20   pH, Arterial 7.488 (H) 7.350 - 7.450   pCO2 arterial 34.1 32.0 - 48.0 mmHg   pO2, Arterial 72.3 (L) 83.0 - 108.0 mmHg   Bicarbonate 26.9 20.0 - 28.0 mmol/L   Acid-Base Excess 2.4 (H) 0.0 - 2.0 mmol/L   O2 Saturation 94.4 %   Patient temperature 38.0    Collection site LEFT RADIAL    Drawn by [phone]    Sample type ARTERIAL DRAW    Allens test (pass/fail) PASS PASS    Comment: Performed at [facility], [address] [place], [place] [address]  Heparin level (unfractionated)     Status: Abnormal   Collection Time: 05/25/18  4:40 AM  Result Value Ref Range   Heparin Unfractionated 0.27 (L) 0.30 - 0.70 IU/mL    Comment: (NOTE) If heparin results are below expected values, and patient dosage has  been confirmed, suggest follow up testing of  antithrombin III levels. Performed at [facility], [address] [place], [place] [address]   CBC     Status: Abnormal   Collection Time: 05/25/18  4:40 AM  Result Value Ref Range   WBC 11.1 (H) 4.0 - 10.5 K/uL   RBC 3.87 (L) 4.22 - 5.81 MIL/uL   Hemoglobin 10.9 (L) 13.0 - 17.0 g/dL   HCT 35.7 (L) 39.0 - 52.0 %   MCV 92.2 78.0 - 100.0 fL   MCH 28.2 26.0 - 34.0 pg   MCHC 30.5 30.0 - 36.0 g/dL   RDW 17.4 (H) 11.5 - 15.5 %   Platelets 263 150 - 400 K/uL    Comment: Performed at [facility], [address], [place], [place] [address]  Comprehensive metabolic panel     Status: Abnormal   Collection Time: 05/25/18  4:40 AM  Result Value Ref Range   Sodium 150 (H) 135 - 145 mmol/L   Potassium 3.2 (L) 3.5 - 5.1 mmol/L   Chloride 113 (H) 98 - 111 mmol/L   CO2 27 22 - 32 mmol/L   Glucose, Bld 161 (H) 70 - 99 mg/dL   BUN 25 (H) 8 - 23 mg/dL   Creatinine, Ser 1.75 (H) 0.61 - 1.24 mg/dL   Calcium 9.2 8.9 - 10.3 mg/dL   Total Protein 6.7 6.5 - 8.1 g/dL   Albumin 3.5 3.5 - 5.0 g/dL   AST 16 15 - 41 U/L   ALT 16 0 - 44 U/L   Alkaline Phosphatase 43 38 - 126 U/L   Total Bilirubin 0.9 0.3 - 1.2 mg/dL   GFR calc non Af Amer 35 (L) >60 mL/min   GFR calc Af Amer 41 (L) >60 mL/min    Comment: (NOTE) The eGFR has been calculated using the CKD EPI equation. This calculation has not been validated in all clinical situations. eGFR's persistently <60 mL/min signify possible Chronic Kidney Disease.    Anion gap 10 5 - 15    Comment: Performed at [facility], [address] [place], [place] [address]  Blood gas, arterial     Status: Abnormal   Collection Time: 05/25/18  5:28 AM  Result Value Ref Range   FIO2 28.00    Delivery systems VENTILATOR    Mode PRESSURE REGULATED VOLUME CONTROL    VT 620 mL   LHR 18 resp/min   Peep/cpap 5.0 cm H20   pH, Arterial 7.480 (H) 7.350 - 7.450   pCO2 arterial 35.8 32.0 - 48.0 mmHg   pO2, Arterial 83.8 83.0 - 108.0 mmHg   Bicarbonate 27.3 20.0 - 28.0  mmol/L   Acid-Base Excess 2.9 (H) 0.0 - 2.0 mmol/L   O2 Saturation 96.3 %   Patient temperature 37.0    Collection site LEFT RADIAL    Drawn by [phone]    Sample type ARTERIAL DRAW    Allens test (pass/fail) PASS PASS    Comment: Performed at [facility], [address] [place], [place] [address]  Glucose, capillary     Status: Abnormal   Collection Time: 05/25/18 12:03 PM  Result Value Ref Range   Glucose-Capillary 160 (H) 70 - 99 mg/dL  Glucose, capillary     Status: Abnormal   Collection Time: 05/25/18  4:04 PM  Result Value Ref Range   Glucose-Capillary 150 (H) 70 - 99 mg/dL  Glucose, capillary     Status: Abnormal   Collection Time: 05/25/18  7:32 PM  Result Value Ref Range   Glucose-Capillary 126 (H) 70 - 99 mg/dL  MRSA PCR Screening     Status: None   Collection Time: 05/25/18  8:34 PM  Result Value Ref Range   MRSA by PCR NEGATIVE NEGATIVE    Comment:        The GeneXpert MRSA Assay (FDA approved for NASAL specimens only), is one component of a comprehensive MRSA colonization surveillance program. It is not intended to diagnose MRSA infection nor to guide or monitor treatment for MRSA infections. Performed at [facility], [address], [place], [place] [address]   Glucose, capillary     Status: Abnormal   Collection Time: 05/25/18 10:58 PM  Result Value Ref Range   Glucose-Capillary 166 (H) 70 - 99 mg/dL   Comment 1 Notify RN    Comment 2 Document in Chart   CBC     Status: Abnormal   Collection Time: 05/26/18  3:58 AM  Result Value Ref Range   WBC 12.2 (H) 4.0 - 10.5 K/uL   RBC 3.83 (L) 4.22 - 5.81 MIL/uL   Hemoglobin 10.9 (L) 13.0 - 17.0 g/dL   HCT 35.7 (L) 39.0 - 52.0 %   MCV 93.2 78.0 - 100.0 fL   MCH 28.5 26.0 - 34.0 pg   MCHC 30.5 30.0 - 36.0 g/dL   RDW 17.3 (H) 11.5 - 15.5 %   Platelets 257 150 - 400 K/uL    Comment: Performed at [facility], [address] [place], [place] [address]  Basic metabolic panel     Status: Abnormal   Collection  Time: 05/26/18  3:58 AM  Result Value Ref Range   Sodium 148 (H) 135 - 145 mmol/L   Potassium 3.6 3.5 - 5.1 mmol/L   Chloride 111 98 - 111 mmol/L   CO2 27 22 - 32 mmol/L   Glucose, Bld 150 (H) 70 - 99 mg/dL   BUN 32 (H) 8 - 23 mg/dL   Creatinine, Ser 1.78 (H) 0.61 - 1.24 mg/dL   Calcium 9.0 8.9 - 10.3 mg/dL  GFR calc non Af Amer 34 (L) >60 mL/min   GFR calc Af Amer 40 (L) >60 mL/min    Comment: (NOTE) The eGFR has been calculated using the CKD EPI equation. This calculation has not been validated in all clinical situations. eGFR's persistently <60 mL/min signify possible Chronic Kidney Disease.    Anion gap 10 5 - 15    Comment: Performed at [facility], [address] [place], [place] [address]  Glucose, capillary     Status: Abnormal   Collection Time: 05/26/18  4:33 AM  Result Value Ref Range   Glucose-Capillary 120 (H) 70 - 99 mg/dL  Glucose, capillary     Status: Abnormal   Collection Time: 05/26/18  7:17 AM  Result Value Ref Range   Glucose-Capillary 121 (H) 70 - 99 mg/dL    ABGS Recent Labs    05/25/18 0528  PHART 7.480*  PO2ART 83.8  HCO3 27.3   CULTURES Recent Results (from the past 240 hour(s))  Culture, blood (routine x 2)     Status: None (Preliminary result)   Collection Time: 05/24/18  6:54 PM  Result Value Ref Range Status   Specimen Description   Final    BLOOD RIGHT HAND Performed at [facility], [address], [place], [place] [address]    Special Requests   Final    BOTTLES DRAWN AEROBIC AND ANAEROBIC Blood Culture adequate volume Performed at [facility], [address] [place], [place] [address]    Culture  Setup Time   Final    GRAM POSITIVE COCCI ANAEROBIC BOTTLE GRAM STAINED RESULT CALLED TO/READ BACK FROM MISTY SMART AT 1300  BY HFLYNT 05/25/18 CRITICAL RESULT CALLED TO, READ BACK BY AND VERIFIED WITH: RN Wallace Keller [phone] MLM Performed at [facility], [place] [address], [place], [place] [address]    Culture GRAM POSITIVE  COCCI  Final   Report Status PENDING  Incomplete  Blood Culture ID Panel (Reflexed)     Status: Abnormal   Collection Time: 05/24/18  6:54 PM  Result Value Ref Range Status   Enterococcus species NOT DETECTED NOT DETECTED Final   Listeria monocytogenes NOT DETECTED NOT DETECTED Final   Staphylococcus species DETECTED (A) NOT DETECTED Final    Comment: Methicillin (oxacillin) resistant coagulase negative staphylococcus. Possible blood culture contaminant (unless isolated from more than one blood culture draw or clinical case suggests pathogenicity). No antibiotic treatment is indicated for blood  culture contaminants. CRITICAL RESULT CALLED TO, READ BACK BY AND VERIFIED WITH: RN E MURPHY [phone] 1824 MLM    Staphylococcus aureus NOT DETECTED NOT DETECTED Final   Methicillin resistance DETECTED (A) NOT DETECTED Final    Comment: CRITICAL RESULT CALLED TO, READ BACK BY AND VERIFIED WITH: RN E MURPHY [phone] 1824 MLM    Streptococcus species NOT DETECTED NOT DETECTED Final   Streptococcus agalactiae NOT DETECTED NOT DETECTED Final   Streptococcus pneumoniae NOT DETECTED NOT DETECTED Final   Streptococcus pyogenes NOT DETECTED NOT DETECTED Final   Acinetobacter baumannii NOT DETECTED NOT DETECTED Final   Enterobacteriaceae species NOT DETECTED NOT DETECTED Final   Enterobacter cloacae complex NOT DETECTED NOT DETECTED Final   Escherichia coli NOT DETECTED NOT DETECTED Final   Klebsiella oxytoca NOT DETECTED NOT DETECTED Final   Klebsiella pneumoniae NOT DETECTED NOT DETECTED Final   Proteus species NOT DETECTED NOT DETECTED Final   Serratia marcescens NOT DETECTED NOT DETECTED Final   Haemophilus influenzae NOT DETECTED NOT DETECTED Final   Neisseria meningitidis  NOT DETECTED NOT DETECTED Final   Pseudomonas aeruginosa NOT DETECTED NOT DETECTED Final   Candida albicans NOT DETECTED NOT DETECTED Final   Candida glabrata NOT DETECTED NOT DETECTED Final   Candida krusei NOT DETECTED NOT  DETECTED Final   Candida parapsilosis NOT DETECTED NOT DETECTED Final   Candida tropicalis NOT DETECTED NOT DETECTED Final    Comment: Performed at [facility], [place] [address], [place], [place] [address]  Culture, blood (routine x 2)     Status: None (Preliminary result)   Collection Time: 05/24/18  7:03 PM  Result Value Ref Range Status   Specimen Description BLOOD LEFT HAND  Final   Special Requests   Final    BOTTLES DRAWN AEROBIC AND ANAEROBIC Blood Culture adequate volume   Culture   Final    NO GROWTH 2 DAYS Performed at [facility], [address] [place], [place] [address]    Report Status PENDING  Incomplete  MRSA PCR Screening     Status: None   Collection Time: 05/25/18  8:34 PM  Result Value Ref Range Status   MRSA by PCR NEGATIVE NEGATIVE Final    Comment:        The GeneXpert MRSA Assay (FDA approved for NASAL specimens only), is one component of a comprehensive MRSA colonization surveillance program. It is not intended to diagnose MRSA infection nor to guide or monitor treatment for MRSA infections. Performed at [facility], [address], [place], [place] [address]    Studies/Results: Mr Virgel Paling MW Contrast  Result Date: 05/24/2018 CLINICAL DATA:  Altered mental status and left lower extremity weakness. EXAM: MRI HEAD WITHOUT CONTRAST MRA HEAD WITHOUT CONTRAST TECHNIQUE: Multiplanar, multiecho pulse sequences of the brain and surrounding structures were obtained without intravenous contrast. Angiographic images of the head were obtained using MRA technique without contrast. COMPARISON:  Head CT 05/10/2018.  Head MRI/MRA 07/28/2010. FINDINGS: MRI HEAD FINDINGS Brain: There is a 3 cm acute infarct involving the right lentiform and caudate nuclei. No intracranial hemorrhage, midline shift, or extra-axial fluid collection is identified. Patchy cerebral white matter T2 hyperintensities have mildly progressed from 2011 and are nonspecific but compatible with  moderate chronic small vessel ischemic disease. There is moderate cerebral atrophy. A 1.3 cm calcified extra-axial mass compatible with a meningioma at the level of the right sylvian fissure is unchanged from 2011 and does not result in significant mass effect or brain edema. 5 mm focus of extra-axial calcification over the left frontal convexity may also represent an incidental meningioma without mass effect or edema. Vascular: Major intracranial vascular flow voids are preserved. Skull and upper cervical spine: Unremarkable bone marrow signal. Sinuses/Orbits: Bilateral cataract extraction. Trace bilateral mastoid effusions. Clear paranasal sinuses. Other: None. MRA HEAD FINDINGS The visualized distal vertebral arteries are patent to the basilar with mild distal V4 stenosis noted on the left. There is irregularity of the proximal right V4 segment without significant stenosis. Patent left PICA, bilateral AICA, and bilateral SCA origins are identified. The right PICA origin may be slightly proximal to the imaging volume. The basilar artery is widely patent. There is a small left posterior communicating artery. The PCAs are patent proximally with a new severe proximal left P1 stenosis. There is signal loss in the left greater than right P2 segments which is felt to be at least partly artifactual due to the course of the vessels with presumed artifactual left P2 signal loss also present on the prior MRA. No significant P1 or proximal  P2 stenosis is evident on the right. The internal carotid arteries are widely patent from skull base to carotid termini. ACAs and MCAs are patent without evidence of proximal branch occlusion or flow limiting proximal stenosis. No aneurysm is identified. IMPRESSION: 1. Acute right basal ganglia infarct. 2. Moderate chronic small vessel ischemic disease and cerebral atrophy. 3. Incidental small meningiomas without mass effect or edema. 4. Patent circle of Willis without large vessel  occlusion. 5. New severe left P1 stenosis and mild distal left V4 stenosis. Electronically Signed   By: Logan Bores M.D.   On: 05/24/2018 10:28   Mr Brain Wo Contrast  Result Date: 05/24/2018 CLINICAL DATA:  Altered mental status and left lower extremity weakness. EXAM: MRI HEAD WITHOUT CONTRAST MRA HEAD WITHOUT CONTRAST TECHNIQUE: Multiplanar, multiecho pulse sequences of the brain and surrounding structures were obtained without intravenous contrast. Angiographic images of the head were obtained using MRA technique without contrast. COMPARISON:  Head CT 05/25/2018.  Head MRI/MRA 07/28/2010. FINDINGS: MRI HEAD FINDINGS Brain: There is a 3 cm acute infarct involving the right lentiform and caudate nuclei. No intracranial hemorrhage, midline shift, or extra-axial fluid collection is identified. Patchy cerebral white matter T2 hyperintensities have mildly progressed from 2011 and are nonspecific but compatible with moderate chronic small vessel ischemic disease. There is moderate cerebral atrophy. A 1.3 cm calcified extra-axial mass compatible with a meningioma at the level of the right sylvian fissure is unchanged from 2011 and does not result in significant mass effect or brain edema. 5 mm focus of extra-axial calcification over the left frontal convexity may also represent an incidental meningioma without mass effect or edema. Vascular: Major intracranial vascular flow voids are preserved. Skull and upper cervical spine: Unremarkable bone marrow signal. Sinuses/Orbits: Bilateral cataract extraction. Trace bilateral mastoid effusions. Clear paranasal sinuses. Other: None. MRA HEAD FINDINGS The visualized distal vertebral arteries are patent to the basilar with mild distal V4 stenosis noted on the left. There is irregularity of the proximal right V4 segment without significant stenosis. Patent left PICA, bilateral AICA, and bilateral SCA origins are identified. The right PICA origin may be slightly proximal to  the imaging volume. The basilar artery is widely patent. There is a small left posterior communicating artery. The PCAs are patent proximally with a new severe proximal left P1 stenosis. There is signal loss in the left greater than right P2 segments which is felt to be at least partly artifactual due to the course of the vessels with presumed artifactual left P2 signal loss also present on the prior MRA. No significant P1 or proximal P2 stenosis is evident on the right. The internal carotid arteries are widely patent from skull base to carotid termini. ACAs and MCAs are patent without evidence of proximal branch occlusion or flow limiting proximal stenosis. No aneurysm is identified. IMPRESSION: 1. Acute right basal ganglia infarct. 2. Moderate chronic small vessel ischemic disease and cerebral atrophy. 3. Incidental small meningiomas without mass effect or edema. 4. Patent circle of Willis without large vessel occlusion. 5. New severe left P1 stenosis and mild distal left V4 stenosis. Electronically Signed   By: Logan Bores M.D.   On: 05/24/2018 10:28   US Carotid Bilateral (at [facility] Only)  Result Date: 05/24/2018 CLINICAL DATA:  82 year old male with a history weakness. Cardiovascular risk factors include hypertension, stroke/TIA, coronary disease, hyperlipidemia, diabetes EXAM: BILATERAL CAROTID DUPLEX ULTRASOUND TECHNIQUE: Pearline Cables scale imaging, color Doppler and duplex ultrasound were performed of bilateral carotid and vertebral arteries in  the neck. COMPARISON:  None. FINDINGS: Criteria: Quantification of carotid stenosis is based on velocity parameters that correlate the residual internal carotid diameter with NASCET-based stenosis levels, using the diameter of the distal internal carotid lumen as the denominator for stenosis measurement. The following velocity measurements were obtained: RIGHT ICA:  Systolic 57 cm/sec, Diastolic 17 cm/sec CCA:  96 cm/sec SYSTOLIC ICA/CCA RATIO:  0.6 ECA:  87 cm/sec  LEFT ICA:  Systolic 60 cm/sec, Diastolic 16 cm/sec CCA:  94 cm/sec SYSTOLIC ICA/CCA RATIO:  0.6 ECA:  76 cm/sec Right Brachial SBP: Not acquired Left Brachial SBP: Not acquired RIGHT CAROTID ARTERY: No significant calcifications of the right common carotid artery. Intermediate waveform maintained. Heterogeneous and partially calcified plaque at the right carotid bifurcation. No significant lumen shadowing. Low resistance waveform of the right ICA. No significant tortuosity. RIGHT VERTEBRAL ARTERY: Antegrade flow with low resistance waveform. LEFT CAROTID ARTERY: No significant calcifications of the left common carotid artery. Intermediate waveform maintained. Heterogeneous and partially calcified plaque at the left carotid bifurcation without significant lumen shadowing. Low resistance waveform of the left ICA. No significant tortuosity. LEFT VERTEBRAL ARTERY:  Antegrade flow with low resistance waveform. IMPRESSION: Color duplex indicates minimal heterogeneous and calcified plaque, with no hemodynamically significant stenosis by duplex criteria in the extracranial cerebrovascular circulation. Signed, Dulcy Fanny. Dellia Nims, RPVI [facility] Radiology Electronically Signed   By: Corrie Mckusick D.O.   On: 05/24/2018 10:46   Dg Chest Port 1 View  Result Date: 05/26/2018 CLINICAL DATA:  Respiratory failure EXAM: PORTABLE CHEST 1 VIEW COMPARISON:  05/25/2018 FINDINGS: Cardiac shadow is within normal limits. Endotracheal tube is noted in satisfactory position. The lungs are well aerated bilaterally. Mild vascular congestion is seen. Patchy infiltrates are again identified in the bases. No new focal infiltrate is seen. No bony abnormality is noted. IMPRESSION: Stable bibasilar infiltrates. Mild vascular congestion. Electronically Signed   By: Inez Catalina M.D.   On: 05/26/2018 07:24   Portable Chest Xray  Result Date: 05/25/2018 CLINICAL DATA:  Respiratory failure,  atrial fibrillation EXAM: PORTABLE CHEST 1 VIEW COMPARISON:  Portable chest x-ray of 05/24/2018 and 05/22/2018 FINDINGS: Opacities at the lung bases are increasing most consistent with pneumonia with possible some atelectasis. No definite effusion is seen. The tip of the endotracheal tube is approximately 5.8 cm above the carina. Mediastinal and hilar contours are unremarkable and the descending thoracic aorta is ectatic. The heart is within normal limits in size. As noted previously the NG tube may be coiling within a hiatal hernia. Abdominal films would be helpful to assess further. IMPRESSION: 1. Increasing bibasilar opacities most consistent with pneumonia. Consider follow-up. 2. NG tube appears to be coiling within a hiatal hernia. Abdominal films would be helpful. 3. Tip of endotracheal tube is approximately 5.8 cm above the carina. Electronically Signed   By: Ivar Drape M.D.   On: 05/25/2018 09:24   Dg Chest Port 1 View  Result Date: 05/24/2018 CLINICAL DATA:  hypoxia EXAM: PORTABLE CHEST 1 VIEW COMPARISON:  1 day prior FINDINGS: Endotracheal tube terminates 5.8 cm above carina.Nasogastric tube is difficult to follow distally. Favored to be looped in a hiatal hernia. Mild cardiomegaly. No pleural effusion or pneumothorax. Mildly worsened left base aeration. IMPRESSION: Endotracheal tube appropriately positioned. Nasogastric tube is likely looped within a hiatal hernia. It is difficult to follow distally. Consider dedicated abdominal radiographs either after repositioning or more acutely. Worsened left-sided aeration which could represent progressive atelectasis or developing infection/aspiration. Electronically Signed  By: Abigail Miyamoto M.D.   On: 05/24/2018 19:09    Medications:  Prior to Admission:  Medications Prior to Admission  Medication Sig Dispense Refill Last Dose  . Acetaminophen 500 MG coapsule Take 500 mg by mouth as needed for pain.    unknown  . amLODipine (NORVASC) 5 MG tablet TAKE  1 TABLET BY MOUTH DAILY. (Patient taking differently: Take 5 mg by mouth daily. ) 90 tablet 1 05/21/2018 at Unknown time  . atorvastatin (LIPITOR) 40 MG tablet Take 1 tablet (40 mg total) by mouth daily. 90 tablet 1 05/16/2018 at Unknown time  . benazepril (LOTENSIN) 40 MG tablet Take 1 tablet (40 mg total) by mouth 2 (two) times daily. 180 tablet 1 05/13/2018 at Unknown time  . betamethasone dipropionate 0.05 % lotion Apply topically 2 (two) times daily. (Patient taking differently: Apply 1 application topically 2 (two) times daily as needed (for irritation). ) 120 mL 0 unknown  . colchicine 0.6 MG tablet Take 1 tablet (0.6 mg total) by mouth daily. Continue daily until you have had no pain in your foot for 2 days.  Then stop. (Patient taking differently: Take 0.6 mg by mouth daily as needed (for gout pain as directed). Continue daily until you have had no pain in your foot for 2 days.  Then stop.) 20 tablet 0 unknown  . fenofibrate 160 MG tablet TAKE 1 TABLET EVERY DAY (Patient taking differently: Take 160 mg by mouth daily. ) 90 tablet 0 05/13/2018 at Unknown time  . furosemide (LASIX) 20 MG tablet TAKE 1 TABLET TWICE DAILY (Patient taking differently: Take 20 mg by mouth 2 (two) times daily. TAKE 1 TABLET TWICE DAILY) 180 tablet 1 05/11/2018 at Unknown time  . gabapentin (NEURONTIN) 400 MG capsule TAKE 2 CAPSULES BY MOUTH 2 (TWO) TIMES DAILY. (Patient taking differently: Take 800 mg by mouth 2 (two) times daily. ) 360 capsule 0 05/10/2018 at Unknown time  . metFORMIN (GLUCOPHAGE) 500 MG tablet Take 500 mg by mouth 2 (two) times daily with a meal.    05/22/2018 at Unknown time  . potassium chloride SA (K-DUR,KLOR-CON) 20 MEQ tablet TAKE 2 TABLETS TWICE DAILY (Patient taking differently: Take 40 mEq by mouth 2 (two) times daily. ) 360 tablet 1 05/29/2018 at Unknown time  . rivaroxaban (XARELTO) 20 MG TABS tablet Take 20 mg by mouth daily with supper.    05/15/2018 at Unknown time   Scheduled: . aspirin  300 mg  Rectal Daily  . atorvastatin  80 mg Per Tube q1800  . chlorhexidine gluconate (MEDLINE KIT)  15 mL Mouth Rinse BID  . enoxaparin (LOVENOX) injection  80 mg Subcutaneous Q12H  . insulin aspart  0-15 Units Subcutaneous Q4H  . ipratropium-albuterol  3 mL Nebulization Q6H  . mouth rinse  15 mL Mouth Rinse 10 times per day  . metoprolol tartrate  5 mg Intravenous Q6H  . sodium chloride flush  3 mL Intravenous Q12H   Continuous: . sodium chloride 10 mL/hr at 05/25/18 1152  . ampicillin-sulbactam (UNASYN) IV 3 g (05/25/18 2323)  . dextrose 5 % 1,000 mL with potassium chloride 40 mEq infusion 100 mL/hr at 05/25/18 2322  . famotidine (PEPCID) IV 20 mg (05/25/18 2212)  . vancomycin     [facility]:XNATFT chloride, acetaminophen **OR** acetaminophen, albuterol, docusate, fentaNYL (SUBLIMAZE) injection, fentaNYL (SUBLIMAZE) injection, midazolam, midazolam, ondansetron **OR** ondansetron (ZOFRAN) IV, sodium chloride flush  Assesment: He was admitted with a stroke that is felt to be related to atrial fib.  He developed respiratory failure and bradycardia.  He was intubated transferred to the intensive care unit and placed on mechanical ventilation.  He has bilateral infiltrates and pretty clearly now has aspiration pneumonia  He has history of diastolic heart failure but seems to be pretty euvolemic at this point.  He is not ready for weaning and extubation because of his mental status at this point. Principal Problem:   CVA (cerebral vascular accident) [facility]) Active Problems:   Essential hypertension   Peripheral neuropathy   Hyperlipidemia   Hypokalemia   Diabetes mellitus with neuropathy (HCC)   Paroxysmal atrial fibrillation (HCC)   AKI (acute kidney injury) ([place])   Chronic diastolic congestive heart failure (HCC)   Chronic anticoagulation   Iron deficiency anemia due to chronic blood loss   Acute respiratory failure (HCC)   Aspiration pneumonia of both lower lobes due to gastric secretions  ([place])    Plan: Remain on mechanical ventilation.  Continue treatment for pneumonia.  Sedation as needed.  Start tube feedings.  Discussed with family at bedside    LOS: 3 days   Armonte Tortorella L 05/26/2018, 8:17 AM

## 2018-05-26 NOTE — Care Management Important Message (Signed)
Important Message  Patient Details  Name: Keith Doyle MRN: [id number] Date of Birth: 1936-04-03   Medicare Important Message Given:  Yes    [name] 05/26/2018, 10:39 AM

## 2018-05-26 NOTE — Progress Notes (Signed)
Pharmacy Renal Dosing Adjustment  Drug: famotidine  Original dose:  famotidine 20mg  bid Scr: 1.78 CrClest~ 2ml/min New dose:  famotidine 20mg  daily  Thank You, Despina Pole, Pharm. D. Clinical Pharmacist 05/26/2018 12:28 PM

## 2018-05-26 NOTE — Progress Notes (Signed)
[place] A. Merlene Laughter, MD     [email or web address]          Keith Doyle is an 82 y.o. male.   Assessment/Plan: 1.  Acute encephalopathy due to respiratory failure: This is apparently due to aspiration and possible pneumonia. 2.  Acute left hemiparesis due to right basal ganglia infarct -this is undoubtedly due to atrial fibrillation: Risk factors atrial fibrillation, hypertension, dyslipidemia and previous TIA.  Patient currently is on IV heparin.  I think it may be more beneficial to place the patient on Lovenox to give him more predictable level of anticoagulation.  Short-term it is reasonable to place the patient on aspirin for couple of weeks.  Patient can restart his baseline Xarelto when appropriate.   Overall things are about the same.  The patient continues to remain febrile episodically.    GENERAL: The patient is currently intubated.  HEENT: Neck appears to be supple.  There is a marked right exotropia.  ABDOMEN: soft  EXTREMITIES: No edema   BACK: Normal  SKIN: Normal by inspection.    MENTAL STATUS: He Eyes closed.  He opens his eyes briefly to repeated deep painful stimuli.  He follows midline commands intermittently.    He does not follow appendicular commands.  CRANIAL NERVES: Pupils are equal, round and reactive to light; extra ocular movements are full using OCR, visual fields are full -appears full but this is limited; upper and lower facial muscles are normal in strength and symmetric, there is no flattening of the nasolabial folds.  MOTOR: There is minimal movements of the painful stimuli of the lower extremities.  The patient does flexion withdrawal to painful stimuli the upper extremities.  He does is vigorously on the right but much less on the left side.  COORDINATION: No tremors or dysmetria is appreciated.   SENSATION: He responds to painful stimuli bilaterally.     Objective: Vital signs in last 24 hours: Temp:   [98.6 F (37 C)-102.9 F (39.4 C)] 99.7 F (37.6 C) (09/20 1152) Pulse Rate:  [52-102] 95 (09/20 1152) Resp:  [9-28] 18 (09/20 1152) BP: (107-183)/(65-105) 159/100 (09/20 0600) SpO2:  [96 %-100 %] 99 % (09/20 1353) FiO2 (%):  [28 %] 28 % (09/20 1353) Weight:  [83.4 kg] 83.4 kg (09/20 0400)  Intake/Output from previous day: 09/19 0701 - 09/20 0700 In: 1904.5 [I.V.:1564; IV Piggyback:340.5] Out: 1300 [Urine:1300] Intake/Output this shift: No intake/output data recorded. Nutritional status:  Diet Order            Diet NPO time specified  Diet effective now               Lab Results: Results for orders placed or performed during the hospital encounter of 05/28/2018 (from the past 48 hour(s))  Culture, blood (routine x 2)     Status: None (Preliminary result)   Collection Time: 05/24/18  6:54 PM  Result Value Ref Range   Specimen Description      BLOOD RIGHT HAND Performed at [facility], [address] [place], [place] [address]    Special Requests      BOTTLES DRAWN AEROBIC AND ANAEROBIC Blood Culture adequate volume Performed at [facility], [address], [place],  [address]    Culture  Setup Time      GRAM POSITIVE COCCI ANAEROBIC BOTTLE GRAM STAINED RESULT CALLED TO/READ BACK FROM MISTY SMART AT 1300  BY HFLYNT 05/25/18 CRITICAL RESULT CALLED TO, READ BACK BY AND VERIFIED WITH: RN E MURPHY  [phone] MLM Performed at [facility], [place] [address] [place], [place] [address]    Culture GRAM POSITIVE COCCI    Report Status PENDING   Lactic acid, plasma     Status: Abnormal   Collection Time: 05/24/18  6:54 PM  Result Value Ref Range   Lactic Acid, Venous 2.1 (HH) 0.5 - 1.9 mmol/L    Comment: CRITICAL RESULT CALLED TO, READ BACK BY AND VERIFIED WITH: WADE,S ON 05/24/18 AT 2020 BY LOY,C Performed at [facility], [address] [place], [place] [address]   Ammonia     Status: None   Collection Time: 05/24/18  6:54 PM  Result Value Ref Range    Ammonia 33 9 - 35 umol/L    Comment: Performed at [facility], [address] [place], [place] [address]  Blood Culture ID Panel (Reflexed)     Status: Abnormal   Collection Time: 05/24/18  6:54 PM  Result Value Ref Range   Enterococcus species NOT DETECTED NOT DETECTED   Listeria monocytogenes NOT DETECTED NOT DETECTED   Staphylococcus species DETECTED (A) NOT DETECTED    Comment: Methicillin (oxacillin) resistant coagulase negative staphylococcus. Possible blood culture contaminant (unless isolated from more than one blood culture draw or clinical case suggests pathogenicity). No antibiotic treatment is indicated for blood  culture contaminants. CRITICAL RESULT CALLED TO, READ BACK BY AND VERIFIED WITH: RN E MURPHY [phone] MLM    Staphylococcus aureus NOT DETECTED NOT DETECTED   Methicillin resistance DETECTED (A) NOT DETECTED    Comment: CRITICAL RESULT CALLED TO, READ BACK BY AND VERIFIED WITH: RN E MURPHY [phone] MLM    Streptococcus species NOT DETECTED NOT DETECTED   Streptococcus agalactiae NOT DETECTED NOT DETECTED   Streptococcus pneumoniae NOT DETECTED NOT DETECTED   Streptococcus pyogenes NOT DETECTED NOT DETECTED   Acinetobacter baumannii NOT DETECTED NOT DETECTED   Enterobacteriaceae species NOT DETECTED NOT DETECTED   Enterobacter cloacae complex NOT DETECTED NOT DETECTED   Escherichia coli NOT DETECTED NOT DETECTED   Klebsiella oxytoca NOT DETECTED NOT DETECTED   Klebsiella pneumoniae NOT DETECTED NOT DETECTED   Proteus species NOT DETECTED NOT DETECTED   Serratia marcescens NOT DETECTED NOT DETECTED   Haemophilus influenzae NOT DETECTED NOT DETECTED   Neisseria meningitidis NOT DETECTED NOT DETECTED   Pseudomonas aeruginosa NOT DETECTED NOT DETECTED   Candida albicans NOT DETECTED NOT DETECTED   Candida glabrata NOT DETECTED NOT DETECTED   Candida krusei NOT DETECTED NOT DETECTED   Candida parapsilosis NOT DETECTED NOT DETECTED   Candida tropicalis NOT  DETECTED NOT DETECTED    Comment: Performed at [place] [address], [place], [place] [address]  Culture, blood (routine x 2)     Status: None (Preliminary result)   Collection Time: 05/24/18  7:03 PM  Result Value Ref Range   Specimen Description BLOOD LEFT HAND    Special Requests      BOTTLES DRAWN AEROBIC AND ANAEROBIC Blood Culture adequate volume   Culture      NO GROWTH 2 DAYS Performed at [facility], [address], [place], [place] [address]    Report Status PENDING   Blood gas, arterial     Status: Abnormal   Collection Time: 05/24/18  8:31 PM  Result Value Ref Range   FIO2 100.00    Delivery systems VENTILATOR    Mode PRESSURE REGULATED VOLUME CONTROL    VT 620 mL   LHR 18 resp/min   Peep/cpap  5.0 cm H20   pH, Arterial 7.477 (H) 7.350 - 7.450   pCO2 arterial 35.9 32.0 - 48.0 mmHg   pO2, Arterial 346 (H) 83.0 - 108.0 mmHg   Bicarbonate RBV 20.0 - 28.0 mmol/L    Comment: UNABLE TO READ DUE TO CRITICALLY HIGH sO2  JAMES DANIELS, RN GIVEN RESULTS OF CRITICAL SO2    Acid-Base Excess 2.8 (H) 0.0 - 2.0 mmol/L   Patient temperature 38.3    Collection site LEFT RADIAL    Drawn by [phone]    Sample type ARTERIAL DRAW    Allens test (pass/fail) PASS PASS    Comment: Performed at [facility], [address], [place], [place] [address]  Heparin level (unfractionated)     Status: Abnormal   Collection Time: 05/24/18 10:11 PM  Result Value Ref Range   Heparin Unfractionated 0.29 (L) 0.30 - 0.70 IU/mL    Comment: (NOTE) If heparin results are below expected values, and patient dosage has  been confirmed, suggest follow up testing of antithrombin III levels. Performed at [facility], [address], [place], [place] [address]   Lactic acid, plasma     Status: None   Collection Time: 05/24/18 10:11 PM  Result Value Ref Range   Lactic Acid, Venous 1.5 0.5 - 1.9 mmol/L    Comment: Performed at [facility], [address], [place], [place] [address]  Blood gas,  arterial     Status: Abnormal   Collection Time: 05/24/18 10:11 PM  Result Value Ref Range   FIO2 28.00    Delivery systems VENTILATOR    Mode PRESSURE REGULATED VOLUME CONTROL    VT 620 mL   LHR 18 resp/min   Peep/cpap 5.0 cm H20   pH, Arterial 7.488 (H) 7.350 - 7.450   pCO2 arterial 34.1 32.0 - 48.0 mmHg   pO2, Arterial 72.3 (L) 83.0 - 108.0 mmHg   Bicarbonate 26.9 20.0 - 28.0 mmol/L   Acid-Base Excess 2.4 (H) 0.0 - 2.0 mmol/L   O2 Saturation 94.4 %   Patient temperature 38.0    Collection site LEFT RADIAL    Drawn by [phone]    Sample type ARTERIAL DRAW    Allens test (pass/fail) PASS PASS    Comment: Performed at [facility], [address] [place], [place] [address]  Heparin level (unfractionated)     Status: Abnormal   Collection Time: 05/25/18  4:40 AM  Result Value Ref Range   Heparin Unfractionated 0.27 (L) 0.30 - 0.70 IU/mL    Comment: (NOTE) If heparin results are below expected values, and patient dosage has  been confirmed, suggest follow up testing of antithrombin III levels. Performed at [facility], [address] [place], [place] [address]   CBC     Status: Abnormal   Collection Time: 05/25/18  4:40 AM  Result Value Ref Range   WBC 11.1 (H) 4.0 - 10.5 K/uL   RBC 3.87 (L) 4.22 - 5.81 MIL/uL   Hemoglobin 10.9 (L) 13.0 - 17.0 g/dL   HCT 35.7 (L) 39.0 - 52.0 %   MCV 92.2 78.0 - 100.0 fL   MCH 28.2 26.0 - 34.0 pg   MCHC 30.5 30.0 - 36.0 g/dL   RDW 17.4 (H) 11.5 - 15.5 %   Platelets 263 150 - 400 K/uL    Comment: Performed at [facility], [address], [place], [place] [address]  Comprehensive metabolic panel     Status: Abnormal   Collection Time: 05/25/18  4:40 AM  Result  Value Ref Range   Sodium 150 (H) 135 - 145 mmol/L   Potassium 3.2 (L) 3.5 - 5.1 mmol/L   Chloride 113 (H) 98 - 111 mmol/L   CO2 27 22 - 32 mmol/L   Glucose, Bld 161 (H) 70 - 99 mg/dL   BUN 25 (H) 8 - 23 mg/dL   Creatinine, Ser 1.75 (H) 0.61 - 1.24 mg/dL   Calcium 9.2 8.9 - 10.3  mg/dL   Total Protein 6.7 6.5 - 8.1 g/dL   Albumin 3.5 3.5 - 5.0 g/dL   AST 16 15 - 41 U/L   ALT 16 0 - 44 U/L   Alkaline Phosphatase 43 38 - 126 U/L   Total Bilirubin 0.9 0.3 - 1.2 mg/dL   GFR calc non Af Amer 35 (L) >60 mL/min   GFR calc Af Amer 41 (L) >60 mL/min    Comment: (NOTE) The eGFR has been calculated using the CKD EPI equation. This calculation has not been validated in all clinical situations. eGFR's persistently <60 mL/min signify possible Chronic Kidney Disease.    Anion gap 10 5 - 15    Comment: Performed at [facility], [address] [place], [place] [address]  Blood gas, arterial     Status: Abnormal   Collection Time: 05/25/18  5:28 AM  Result Value Ref Range   FIO2 28.00    Delivery systems VENTILATOR    Mode PRESSURE REGULATED VOLUME CONTROL    VT 620 mL   LHR 18 resp/min   Peep/cpap 5.0 cm H20   pH, Arterial 7.480 (H) 7.350 - 7.450   pCO2 arterial 35.8 32.0 - 48.0 mmHg   pO2, Arterial 83.8 83.0 - 108.0 mmHg   Bicarbonate 27.3 20.0 - 28.0 mmol/L   Acid-Base Excess 2.9 (H) 0.0 - 2.0 mmol/L   O2 Saturation 96.3 %   Patient temperature 37.0    Collection site LEFT RADIAL    Drawn by [phone]    Sample type ARTERIAL DRAW    Allens test (pass/fail) PASS PASS    Comment: Performed at [facility], [address], [place], [place] [address]  Glucose, capillary     Status: Abnormal   Collection Time: 05/25/18 12:03 PM  Result Value Ref Range   Glucose-Capillary 160 (H) 70 - 99 mg/dL  Glucose, capillary     Status: Abnormal   Collection Time: 05/25/18  4:04 PM  Result Value Ref Range   Glucose-Capillary 150 (H) 70 - 99 mg/dL  Glucose, capillary     Status: Abnormal   Collection Time: 05/25/18  7:32 PM  Result Value Ref Range   Glucose-Capillary 126 (H) 70 - 99 mg/dL  MRSA PCR Screening     Status: None   Collection Time: 05/25/18  8:34 PM  Result Value Ref Range   MRSA by PCR NEGATIVE NEGATIVE    Comment:        The GeneXpert MRSA Assay (FDA approved  for NASAL specimens only), is one component of a comprehensive MRSA colonization surveillance program. It is not intended to diagnose MRSA infection nor to guide or monitor treatment for MRSA infections. Performed at [facility], [address], [place], [place] [address]   Glucose, capillary     Status: Abnormal   Collection Time: 05/25/18 10:58 PM  Result Value Ref Range   Glucose-Capillary 166 (H) 70 - 99 mg/dL   Comment 1 Notify RN    Comment 2 Document in Chart   CBC     Status: Abnormal  Collection Time: 05/26/18  3:58 AM  Result Value Ref Range   WBC 12.2 (H) 4.0 - 10.5 K/uL   RBC 3.83 (L) 4.22 - 5.81 MIL/uL   Hemoglobin 10.9 (L) 13.0 - 17.0 g/dL   HCT 35.7 (L) 39.0 - 52.0 %   MCV 93.2 78.0 - 100.0 fL   MCH 28.5 26.0 - 34.0 pg   MCHC 30.5 30.0 - 36.0 g/dL   RDW 17.3 (H) 11.5 - 15.5 %   Platelets 257 150 - 400 K/uL    Comment: Performed at [facility], [address], [place], [place] [address]  Basic metabolic panel     Status: Abnormal   Collection Time: 05/26/18  3:58 AM  Result Value Ref Range   Sodium 148 (H) 135 - 145 mmol/L   Potassium 3.6 3.5 - 5.1 mmol/L   Chloride 111 98 - 111 mmol/L   CO2 27 22 - 32 mmol/L   Glucose, Bld 150 (H) 70 - 99 mg/dL   BUN 32 (H) 8 - 23 mg/dL   Creatinine, Ser 1.78 (H) 0.61 - 1.24 mg/dL   Calcium 9.0 8.9 - 10.3 mg/dL   GFR calc non Af Amer 34 (L) >60 mL/min   GFR calc Af Amer 40 (L) >60 mL/min    Comment: (NOTE) The eGFR has been calculated using the CKD EPI equation. This calculation has not been validated in all clinical situations. eGFR's persistently <60 mL/min signify possible Chronic Kidney Disease.    Anion gap 10 5 - 15    Comment: Performed at [facility], [address] [place], [place] [address]  Magnesium     Status: None   Collection Time: 05/26/18  3:58 AM  Result Value Ref Range   Magnesium 2.2 1.7 - 2.4 mg/dL    Comment: Performed at [facility], [address], [place], [place] [address]  Phosphorus      Status: None   Collection Time: 05/26/18  3:58 AM  Result Value Ref Range   Phosphorus 3.4 2.5 - 4.6 mg/dL    Comment: Performed at [facility], [address] [place], [place] [address]  Glucose, capillary     Status: Abnormal   Collection Time: 05/26/18  4:33 AM  Result Value Ref Range   Glucose-Capillary 120 (H) 70 - 99 mg/dL  Glucose, capillary     Status: Abnormal   Collection Time: 05/26/18  7:17 AM  Result Value Ref Range   Glucose-Capillary 121 (H) 70 - 99 mg/dL  Blood gas, arterial     Status: None   Collection Time: 05/26/18  8:27 AM  Result Value Ref Range   FIO2 28.00    Delivery systems VENTILATOR    Mode PRESSURE REGULATED VOLUME CONTROL    VT 620 mL   LHR 18 resp/min   Peep/cpap 5.0 cm H20   pH, Arterial 7.439 7.350 - 7.450   pCO2 arterial 37.0 32.0 - 48.0 mmHg   pO2, Arterial 108 83.0 - 108.0 mmHg   Bicarbonate 25.4 20.0 - 28.0 mmol/L   Acid-Base Excess 0.9 0.0 - 2.0 mmol/L   O2 Saturation 98.0 %   Collection site LEFT RADIAL    Drawn by [phone]    Sample type ARTERIAL    Allens test (pass/fail) PASS PASS    Comment: Performed at [facility], [address] [place], [place] [address]  Glucose, capillary     Status: Abnormal   Collection Time: 05/26/18 11:55 AM  Result Value Ref Range   Glucose-Capillary 152 (H)  70 - 99 mg/dL    Lipid Panel Recent Labs    05/24/18 0555  CHOL 151  TRIG 150*  HDL 34*  CHOLHDL 4.4  VLDL 30  LDLCALC 87    Studies/Results:  CXR FINDINGS: Opacities at the lung bases are increasing most consistent with pneumonia with possible some atelectasis. No definite effusion is seen. The tip of the endotracheal tube is approximately 5.8 cm above the carina. Mediastinal and hilar contours are unremarkable and the descending thoracic aorta is ectatic. The heart is within normal limits in size. As noted previously the NG tube may be coiling within a hiatal hernia. Abdominal films would be helpful to  assess further.  IMPRESSION: 1. Increasing bibasilar opacities most consistent with pneumonia. Consider follow-up. 2. NG tube appears to be coiling within a hiatal hernia. Abdominal films would be helpful. 3. Tip of endotracheal tube is approximately 5.8 cm above the Carina.         Medications:  Scheduled Meds: . aspirin  300 mg Rectal Daily  . atorvastatin  80 mg Per Tube q1800  . chlorhexidine gluconate (MEDLINE KIT)  15 mL Mouth Rinse BID  . enoxaparin (LOVENOX) injection  80 mg Subcutaneous Q12H  . [START ON 05/27/2018] feeding supplement (PRO-STAT SUGAR FREE 64)  30 mL Per Tube Daily  . insulin aspart  0-15 Units Subcutaneous Q4H  . ipratropium-albuterol  3 mL Nebulization Q6H  . mouth rinse  15 mL Mouth Rinse 10 times per day  . metoprolol tartrate  5 mg Intravenous Q6H  . sodium chloride flush  3 mL Intravenous Q12H   Continuous Infusions: . sodium chloride 10 mL/hr at 05/25/18 1152  . ampicillin-sulbactam (UNASYN) IV 3 g (05/26/18 0910)  . dextrose 5 % 1,000 mL with potassium chloride 40 mEq infusion 100 mL/hr at 05/25/18 2322  . [START ON 05/27/2018] famotidine (PEPCID) IV    . feeding supplement (VITAL AF 1.2 CAL)    . vancomycin     PRN Meds:.sodium chloride, acetaminophen **OR** acetaminophen, albuterol, docusate, fentaNYL (SUBLIMAZE) injection, fentaNYL (SUBLIMAZE) injection, midazolam, midazolam, ondansetron **OR** ondansetron (ZOFRAN) IV, sodium chloride flush     LOS: 3 days   Jacara Benito A. Merlene Laughter, M.D.  Diplomate, Tax adviser of Psychiatry and Neurology ( Neurology).

## 2018-05-26 NOTE — Progress Notes (Addendum)
PROGRESS NOTE    Keith Doyle  SNK:[id number] DOB: April 20, 1936 DOA: 06/02/2018 PCP: Janora Norlander, DO    Brief Narrative:  82 y/o male admitted to the hospital with acute right basal ganglia CVA. Patient became increasingly lethargic and began having fevers. This was followed by development of respiratory distress and he became unresponsive. He was intubated for airway protection and respiratory distress. He was transferred to ICU. I suspect he may have aspirated. He was started on IV antibiotics.   Assessment & Plan:   Principal Problem:   CVA (cerebral vascular accident) [facility]) Active Problems:   Essential hypertension   Peripheral neuropathy   Hyperlipidemia   Hypokalemia   Diabetes mellitus with neuropathy (HCC)   Paroxysmal atrial fibrillation (HCC)   AKI (acute kidney injury) ([place])   Chronic diastolic congestive heart failure (HCC)   Chronic anticoagulation   Iron deficiency anemia due to chronic blood loss   Acute respiratory failure (HCC)   Aspiration pneumonia of both lower lobes due to gastric secretions (HCC)   Malnutrition of moderate degree   Pressure injury of skin   1. Acute CVA. Patient had presented with acute onset of LLE weakness. MRI brain confirms right basal ganglia infarct. MRA head shows some atherosclerotic disease on the left side. Carotid dopplers did not show any significant stenosis bilaterally. Echo did not show any significant findings. Seen by physical therapy who recommended SNF. He is chronically on xarelto, but this has been switched to lovenox since he did not pass his swallow evaluation. Neurology has evaluated the patient. 2. Acute respiratory failure with hypoxia. Related to aspiration pneumonia. Patient became unresponsive, febrile and developed respiratory distress. He was intubated for airway protection and respiratory distress on 9/18.  He is currently on intravenous antibiotics, but continues to have febrile episodes.  He has had 1 out  of 2 positive blood cultures for coagulase-negative staph.  Suspect this is a contaminant.  Pulmonology following. Lactic acid initially elevated, but has since trended down.  He will need to remain on the ventilator for now.  Multiple attempts have been made to pass OG to provide medications/feeding.  This repeatedly gets coiled in his hiatal hernia.  May need to consider PICC line for TPN if he is not able to extubate soon. Continue current treatments 3. Paroxysmal atrial fibrillation on chronic Xarelto.  Currently on therapeutic dose Lovenox. Heart rate is currently stable on lopressor. Can resume xarelto once acute issues have resolved. 4. HLD.  Will resume statin once able to take p.o. medications.  LDL is above goal considering he has had a stroke. 5. Peripheral neuropathy. Gabapentin was placed on hold due to altered mental status. 6. Bilateral lower extremity edema. Mild elevation of BNP. Echo shows normal EF.  Initially had good urine output with Lasix, but further diuretics will be held due to increasing creatinine 7. AKI. Likely related to diuretics.  Started on IV fluids.  Creatinine is relatively unchanged when compared to yesterday.  Continue to monitor.  Urine output has been fair. 8. Diabetes. Metformin currently on hold. Continue sliding scale insulin.  Blood sugars have been stable   DVT prophylaxis: Full dose Lovenox Code Status: full code Family Communication: No family present Disposition Plan: Patient will remain in the ICU at this time for ventilator management   Consultants:   Neurology  Pulmonology  Procedures:  Echo: - Left ventricle: The cavity size was normal. Wall thickness was   increased increased in a pattern of mild to moderate  LVH.   Systolic function was normal. The estimated ejection fraction was   in the range of 60% to 65%. Wall motion was normal; there were no   regional wall motion abnormalities. - Aortic valve: Mildly calcified annulus.  Trileaflet. - Aortic root: The aortic root was mildly ectatic. - Mitral valve: There was trivial regurgitation. - Left atrium: The atrium was moderately dilated. - Right atrium: The atrium was mildly dilated. Central venous   pressure (est): 8 mm Hg. - Atrial septum: No defect or patent foramen ovale was identified. - Tricuspid valve: There was trivial regurgitation.  - Pericardium, extracardiac: There was no pericardial effusion.  Intubation 9/18 >  Antimicrobials:   Zosyn 9/18 > 9/19  Unasyn 9/19 >   Subjective: Unresponsive, on ventilator  Objective: Vitals:   05/26/18 1400 05/26/18 1500 05/26/18 1600 05/26/18 1610  BP: (!) 172/96 (!) 169/91 (!) 173/91   Pulse: (!) 138 89 86 88  Resp: _0 Temp: 100 F (37.8 C) 100 F (37.8 C) 100.2 F (37.9 C) 100.2 F (37.9 C)  TempSrc:      SpO2: 98% 100% 100% 99%  Weight:      Height:        Intake/Output Summary (Last 24 hours) at 05/26/2018 1722 Last data filed at 05/26/2018 0517 Gross per 24 hour  Intake 1348.61 ml  Output 1300 ml  Net 48.61 ml   Filed Weights   05/24/18 1852 05/25/18 0500 05/26/18 0400  Weight: 80.5 kg 82.2 kg 83.4 kg    Examination:  General exam: Intubated and sedated Respiratory system: Clear to auscultation. Respiratory effort normal. Cardiovascular system:RRR. No murmurs, rubs, gallops. Gastrointestinal system: Abdomen is nondistended, soft and nontender. No organomegaly or masses felt. Normal bowel sounds heard. Central nervous system: Unable to assess due to mental status Extremities: 1+ edema bilaterally Skin: No rashes, lesions or ulcers Psychiatry: Unresponsive   Data Reviewed: I have personally reviewed following labs and imaging studies  CBC: Recent Labs  Lab 05/14/2018 1743 06/01/2018 2232 05/25/18 0440 05/26/18 0358  WBC 6.5 6.3 11.1* 12.2*  NEUTROABS 4.3  --   --   --   HGB 10.5* 10.2* 10.9* 10.9*  HCT 33.9* 33.6* 35.7* 35.7*  MCV 91.4 90.6 92.2 93.2  PLT 260  250 263 956   Basic Metabolic Panel: Recent Labs  Lab 06/02/2018 1743 05/24/18 0555 05/25/18 0440 05/26/18 0358  NA 141 148* 150* 148*  K 3.3* 3.4* 3.2* 3.6  CL 108 113* 113* 111  CO2 _1 GLUCOSE 111* 154* 161* 150*  BUN 15 13 25* 32*  CREATININE 1.11 1.02 1.75* 1.78*  CALCIUM 9.6 9.6 9.2 9.0  MG  --  2.0  --  2.2  PHOS  --   --   --  3.4   GFR: Estimated Creatinine Clearance: 36.3 mL/min (A) (by C-G formula based on SCr of 1.78 mg/dL (H)). Liver Function Tests: Recent Labs  Lab 06/05/2018 1743 05/25/18 0440  AST 18 16  ALT 17 16  ALKPHOS 51 43  BILITOT 0.5 0.9  PROT 7.2 6.7  ALBUMIN 4.0 3.5   No results for input(s): LIPASE, AMYLASE in the last 168 hours. Recent Labs  Lab 05/24/18 1854  AMMONIA 33   Coagulation Profile: Recent Labs  Lab 05/10/2018 1743  INR 1.10   Cardiac Enzymes: Recent Labs  Lab 05/18/2018 1743  TROPONINI <0.03   BNP (last 3 results) No results for input(s): PROBNP in the last 8760 hours.  HbA1C: Recent Labs    05/19/2018 2232  HGBA1C 5.5   CBG: Recent Labs  Lab 05/25/18 2258 05/26/18 0433 05/26/18 0717 05/26/18 1155 05/26/18 1613  GLUCAP 166* 120* 121* 152* 141*   Lipid Profile: Recent Labs    05/24/18 0555  CHOL 151  HDL 34*  LDLCALC 87  TRIG 150*  CHOLHDL 4.4   Thyroid Function Tests: Recent Labs    05/28/2018 1743  TSH 3.667   Anemia Panel: No results for input(s): VITAMINB12, FOLATE, FERRITIN, TIBC, IRON, RETICCTPCT in the last 72 hours. Sepsis Labs: Recent Labs  Lab 05/24/18 1854 05/24/18 2211  LATICACIDVEN 2.1* 1.5    Recent Results (from the past 240 hour(s))  Culture, blood (routine x 2)     Status: Abnormal (Preliminary result)   Collection Time: 05/24/18  6:54 PM  Result Value Ref Range Status   Specimen Description   Final    BLOOD RIGHT HAND Performed at [facility], [address] [place], [place] [address]    Special Requests   Final    BOTTLES DRAWN AEROBIC AND ANAEROBIC Blood  Culture adequate volume Performed at [facility], [address], [place], [place] [address]    Culture  Setup Time   Final    GRAM POSITIVE COCCI ANAEROBIC BOTTLE GRAM STAINED RESULT CALLED TO/READ BACK FROM MISTY SMART AT 1300  BY HFLYNT 05/25/18 CRITICAL RESULT CALLED TO, READ BACK BY AND VERIFIED WITH: RN E MURPHY [phone] 1824 MLM    Culture (A)  Final    STAPHYLOCOCCUS SPECIES (COAGULASE NEGATIVE) THE SIGNIFICANCE OF ISOLATING THIS ORGANISM FROM A SINGLE SET OF BLOOD CULTURES WHEN MULTIPLE SETS ARE DRAWN IS UNCERTAIN. PLEASE NOTIFY THE MICROBIOLOGY DEPARTMENT WITHIN ONE WEEK IF SPECIATION AND SENSITIVITIES ARE REQUIRED. Performed at [facility], [place] [address], [place], [place] [address]    Report Status PENDING  Incomplete  Blood Culture ID Panel (Reflexed)     Status: Abnormal   Collection Time: 05/24/18  6:54 PM  Result Value Ref Range Status   Enterococcus species NOT DETECTED NOT DETECTED Final   Listeria monocytogenes NOT DETECTED NOT DETECTED Final   Staphylococcus species DETECTED (A) NOT DETECTED Final    Comment: Methicillin (oxacillin) resistant coagulase negative staphylococcus. Possible blood culture contaminant (unless isolated from more than one blood culture draw or clinical case suggests pathogenicity). No antibiotic treatment is indicated for blood  culture contaminants. CRITICAL RESULT CALLED TO, READ BACK BY AND VERIFIED WITH: RN E MURPHY [phone] 1824 MLM    Staphylococcus aureus NOT DETECTED NOT DETECTED Final   Methicillin resistance DETECTED (A) NOT DETECTED Final    Comment: CRITICAL RESULT CALLED TO, READ BACK BY AND VERIFIED WITH: RN E MURPHY [phone] 1824 MLM    Streptococcus species NOT DETECTED NOT DETECTED Final   Streptococcus agalactiae NOT DETECTED NOT DETECTED Final   Streptococcus pneumoniae NOT DETECTED NOT DETECTED Final   Streptococcus pyogenes NOT DETECTED NOT DETECTED Final   Acinetobacter baumannii NOT DETECTED NOT DETECTED Final    Enterobacteriaceae species NOT DETECTED NOT DETECTED Final   Enterobacter cloacae complex NOT DETECTED NOT DETECTED Final   Escherichia coli NOT DETECTED NOT DETECTED Final   Klebsiella oxytoca NOT DETECTED NOT DETECTED Final   Klebsiella pneumoniae NOT DETECTED NOT DETECTED Final   Proteus species NOT DETECTED NOT DETECTED Final   Serratia marcescens NOT DETECTED NOT DETECTED Final   Haemophilus influenzae NOT DETECTED NOT DETECTED Final   Neisseria meningitidis NOT DETECTED NOT DETECTED Final   Pseudomonas aeruginosa  NOT DETECTED NOT DETECTED Final   Candida albicans NOT DETECTED NOT DETECTED Final   Candida glabrata NOT DETECTED NOT DETECTED Final   Candida krusei NOT DETECTED NOT DETECTED Final   Candida parapsilosis NOT DETECTED NOT DETECTED Final   Candida tropicalis NOT DETECTED NOT DETECTED Final    Comment: Performed at [facility], [place] [address], [place], [place] [address]  Culture, blood (routine x 2)     Status: None (Preliminary result)   Collection Time: 05/24/18  7:03 PM  Result Value Ref Range Status   Specimen Description BLOOD LEFT HAND  Final   Special Requests   Final    BOTTLES DRAWN AEROBIC AND ANAEROBIC Blood Culture adequate volume   Culture   Final    NO GROWTH 2 DAYS Performed at [facility], [address], [place], [place] [address]    Report Status PENDING  Incomplete  MRSA PCR Screening     Status: None   Collection Time: 05/25/18  8:34 PM  Result Value Ref Range Status   MRSA by PCR NEGATIVE NEGATIVE Final    Comment:        The GeneXpert MRSA Assay (FDA approved for NASAL specimens only), is one component of a comprehensive MRSA colonization surveillance program. It is not intended to diagnose MRSA infection nor to guide or monitor treatment for MRSA infections. Performed at [facility], [address], [place], [place] [address]          Radiology Studies: Dg Abd 1 View  Result Date: 05/26/2018 CLINICAL DATA:  OG tube  placement. EXAM: ABDOMEN - 1 VIEW COMPARISON:  CT scan 09/01/2010 FINDINGS: Supine view the abdomen shows a nonspecific bowel gas pattern. On the CT scan from 8 years ago, the patient had a large hiatal hernia. Recent chest x-rays suggest persistence of the hiatal hernia. A similar the presence of a hiatal hernia, the OG tube is likely folded on itself within the hiatal hernia in the tube appears kinked at the level of the proximal port. IVC filter visualized in situ IMPRESSION: Assuming moderate to large hiatal hernia as noted on previous CT of 09/01/2010, the OG tube is probably folded on itself within the hernia and kinked at the level of the proximal port. Electronically Signed   By: Misty Stanley M.D.   On: 05/26/2018 13:43   Dg Chest Port 1 View  Result Date: 05/26/2018 CLINICAL DATA:  Respiratory failure EXAM: PORTABLE CHEST 1 VIEW COMPARISON:  05/25/2018 FINDINGS: Cardiac shadow is within normal limits. Endotracheal tube is noted in satisfactory position. The lungs are well aerated bilaterally. Mild vascular congestion is seen. Patchy infiltrates are again identified in the bases. No new focal infiltrate is seen. No bony abnormality is noted. IMPRESSION: Stable bibasilar infiltrates. Mild vascular congestion. Electronically Signed   By: Inez Catalina M.D.   On: 05/26/2018 07:24   Portable Chest Xray  Result Date: 05/25/2018 CLINICAL DATA:  Respiratory failure, atrial fibrillation EXAM: PORTABLE CHEST 1 VIEW COMPARISON:  Portable chest x-ray of 05/24/2018 and 05/31/2018 FINDINGS: Opacities at the lung bases are increasing most consistent with pneumonia with possible some atelectasis. No definite effusion is seen. The tip of the endotracheal tube is approximately 5.8 cm above the carina. Mediastinal and hilar contours are unremarkable and the descending thoracic aorta is ectatic. The heart is within normal limits in size. As noted previously the NG tube may be coiling within a hiatal hernia. Abdominal  films would be helpful to assess further. IMPRESSION: 1.  Increasing bibasilar opacities most consistent with pneumonia. Consider follow-up. 2. NG tube appears to be coiling within a hiatal hernia. Abdominal films would be helpful. 3. Tip of endotracheal tube is approximately 5.8 cm above the carina. Electronically Signed   By: Ivar Drape M.D.   On: 05/25/2018 09:24   Dg Chest Port 1 View  Result Date: 05/24/2018 CLINICAL DATA:  hypoxia EXAM: PORTABLE CHEST 1 VIEW COMPARISON:  1 day prior FINDINGS: Endotracheal tube terminates 5.8 cm above carina.Nasogastric tube is difficult to follow distally. Favored to be looped in a hiatal hernia. Mild cardiomegaly. No pleural effusion or pneumothorax. Mildly worsened left base aeration. IMPRESSION: Endotracheal tube appropriately positioned. Nasogastric tube is likely looped within a hiatal hernia. It is difficult to follow distally. Consider dedicated abdominal radiographs either after repositioning or more acutely. Worsened left-sided aeration which could represent progressive atelectasis or developing infection/aspiration. Electronically Signed   By: Abigail Miyamoto M.D.   On: 05/24/2018 19:09   Dg Chest Port 1v Same Day  Result Date: 05/26/2018 CLINICAL DATA:  OG tube placement EXAM: PORTABLE CHEST 1 VIEW COMPARISON:  None. FINDINGS: Endotracheal tube with the tip 5.2 cm above the carina. Nasogastric tube visualized to the level of the diaphragm, but not seen below the diaphragm secondary to technique. Recommend a KUB for re-evaluation. No focal consolidation, pleural effusion or pneumothorax. Stable cardiomediastinal silhouette. Severe osteoarthritis of the right glenohumeral joint. No acute osseous abnormality. IMPRESSION: 1. Endotracheal tube with the tip 5.2 cm above the carina. 2. Nasogastric tube visualized to the level of the diaphragm, but not seen below the diaphragm secondary to technique. Recommend a KUB for re-evaluation. Electronically Signed   By: Kathreen Devoid   On: 05/26/2018 11:12        Scheduled Meds: . aspirin  300 mg Rectal Daily  . atorvastatin  80 mg Per Tube q1800  . chlorhexidine gluconate (MEDLINE KIT)  15 mL Mouth Rinse BID  . enoxaparin (LOVENOX) injection  80 mg Subcutaneous Q12H  . [START ON 05/27/2018] feeding supplement (PRO-STAT SUGAR FREE 64)  30 mL Per Tube Daily  . insulin aspart  0-15 Units Subcutaneous Q4H  . ipratropium-albuterol  3 mL Nebulization Q6H  . mouth rinse  15 mL Mouth Rinse 10 times per day  . metoprolol tartrate  5 mg Intravenous Q6H  . sodium chloride flush  3 mL Intravenous Q12H   Continuous Infusions: . sodium chloride 10 mL/hr at 05/25/18 1152  . ampicillin-sulbactam (UNASYN) IV 3 g (05/26/18 0910)  . dextrose 5 % 1,000 mL with potassium chloride 40 mEq infusion 100 mL/hr at 05/25/18 2322  . [START ON 05/27/2018] famotidine (PEPCID) IV    . feeding supplement (VITAL AF 1.2 CAL)    . vancomycin       LOS: 3 days    Critical care time spent: 58mns.  Patient remains critically ill with aspiration pneumonia and respiratory failure requiring intubation and mechanical ventilation.  He needs close monitoring in the ICU.    JKathie Dike MD [facility] Pager [phone] If 7PM-7AM, please contact night-coverage [email or web address] Password [email or web address] 05/26/2018, 5:22 PM

## 2018-05-27 ENCOUNTER — Inpatient Hospital Stay (HOSPITAL_COMMUNITY): Payer: Medicare HMO

## 2018-05-27 LAB — BLOOD GAS, ARTERIAL
ACID-BASE EXCESS: 0.3 mmol/L (ref 0.0–2.0)
BICARBONATE: 25 mmol/L (ref 20.0–28.0)
Drawn by: 28459
FIO2: 28
LHR: 18 {breaths}/min
MECHVT: 620 mL
O2 Saturation: 98.7 %
PEEP/CPAP: 5 cmH2O
Patient temperature: 37
pCO2 arterial: 34 mmHg (ref 32.0–48.0)
pH, Arterial: 7.458 — ABNORMAL HIGH (ref 7.350–7.450)
pO2, Arterial: 123 mmHg — ABNORMAL HIGH (ref 83.0–108.0)

## 2018-05-27 LAB — GLUCOSE, CAPILLARY
GLUCOSE-CAPILLARY: 129 mg/dL — AB (ref 70–99)
GLUCOSE-CAPILLARY: 129 mg/dL — AB (ref 70–99)
Glucose-Capillary: 154 mg/dL — ABNORMAL HIGH (ref 70–99)
Glucose-Capillary: 120 mg/dL — ABNORMAL HIGH (ref 70–99)
Glucose-Capillary: 138 mg/dL — ABNORMAL HIGH (ref 70–99)
Glucose-Capillary: 117 mg/dL — ABNORMAL HIGH (ref 70–99)

## 2018-05-27 LAB — CBC
HEMATOCRIT: 34.3 % — AB (ref 39.0–52.0)
Hemoglobin: 10.3 g/dL — ABNORMAL LOW (ref 13.0–17.0)
MCH: 27.7 pg (ref 26.0–34.0)
MCHC: 30 g/dL (ref 30.0–36.0)
MCV: 92.2 fL (ref 78.0–100.0)
PLATELETS: 255 10*3/uL (ref 150–400)
RBC: 3.72 MIL/uL — ABNORMAL LOW (ref 4.22–5.81)
RDW: 16.7 % — AB (ref 11.5–15.5)
WBC: 8.6 10*3/uL (ref 4.0–10.5)

## 2018-05-27 LAB — COMPREHENSIVE METABOLIC PANEL
ALBUMIN: 3.1 g/dL — AB (ref 3.5–5.0)
ALK PHOS: 45 U/L (ref 38–126)
ALT: 13 U/L (ref 0–44)
AST: 18 U/L (ref 15–41)
Anion gap: 7 (ref 5–15)
BILIRUBIN TOTAL: 0.6 mg/dL (ref 0.3–1.2)
BUN: 20 mg/dL (ref 8–23)
CO2: 25 mmol/L (ref 22–32)
CREATININE: 1.12 mg/dL (ref 0.61–1.24)
Calcium: 8.8 mg/dL — ABNORMAL LOW (ref 8.9–10.3)
Chloride: 114 mmol/L — ABNORMAL HIGH (ref 98–111)
GFR calc Af Amer: >60 mL/min (ref >60)
GFR calc non Af Amer: >60 mL/min (ref >60)
Glucose, Bld: 147 mg/dL — ABNORMAL HIGH (ref 70–99)
Potassium: 3.4 mmol/L — ABNORMAL LOW (ref 3.5–5.1)
SODIUM: 146 mmol/L — AB (ref 135–145)
Total Protein: 6.3 g/dL — ABNORMAL LOW (ref 6.5–8.1)

## 2018-05-27 LAB — MAGNESIUM
Magnesium: 2.1 mg/dL (ref 1.7–2.4)
Magnesium: 2 mg/dL (ref 1.7–2.4)

## 2018-05-27 LAB — CULTURE, BLOOD (ROUTINE X 2): Special Requests: ADEQUATE

## 2018-05-27 LAB — PHOSPHORUS
PHOSPHORUS: 2.6 mg/dL (ref 2.5–4.6)
Phosphorus: 2.5 mg/dL (ref 2.5–4.6)

## 2018-05-27 MED ORDER — HYDRALAZINE HCL 20 MG/ML IJ SOLN
10.0000 mg | INTRAMUSCULAR | Status: DC | PRN
  Administered 2018-05-29: 10 mg via INTRAVENOUS
  Filled 2018-05-27: qty 1

## 2018-05-27 MED ORDER — POLYVINYL ALCOHOL 1.4 % OP SOLN
1.0000 [drp] | OPHTHALMIC | Status: DC | PRN
  Administered 2018-05-27 – 2018-05-29 (×2): 1 [drp] via OPHTHALMIC
  Filled 2018-05-27: qty 15

## 2018-05-27 NOTE — Progress Notes (Signed)
Subjective: I think he is overall about the same.  He remains on the ventilator.  Poorly responsive but he is getting sedation.  He has been coughing.  Objective: Vital signs in last 24 hours: Temp:  [98.8 F (37.1 C)-100.6 F (38.1 C)] 98.8 F (37.1 C) (09/21 0600) Pulse Rate:  [78-138] 79 (09/21 0600) Resp:  [15-21] 17 (09/21 0600) BP: (142-182)/(87-101) 171/93 (09/21 0600) SpO2:  [97 %-100 %] 100 % (09/21 0600) FiO2 (%):  [28 %] 28 % (09/21 0524) Weight:  [85 kg] 85 kg (09/21 0500) Weight change: 1.6 kg Last BM Date: (unknown)  Intake/Output from previous day: 09/20 0701 - 09/21 0700 In: 2983.6 [I.V.:2793.2; IV Piggyback:190.4] Out: 750 [Urine:750]  PHYSICAL EXAM General appearance: Intubated sedated on mechanical ventilation Resp: rhonchi bilaterally Cardio: irregularly irregular rhythm GI: soft, non-tender; bowel sounds normal; no masses,  no organomegaly Extremities: extremities normal, atraumatic, no cyanosis or edema  Lab Results:  Results for orders placed or performed during the hospital encounter of 05/17/2018 (from the past 48 hour(s))  Glucose, capillary     Status: Abnormal   Collection Time: 05/25/18 12:03 PM  Result Value Ref Range   Glucose-Capillary 160 (H) 70 - 99 mg/dL  Glucose, capillary     Status: Abnormal   Collection Time: 05/25/18  4:04 PM  Result Value Ref Range   Glucose-Capillary 150 (H) 70 - 99 mg/dL  Glucose, capillary     Status: Abnormal   Collection Time: 05/25/18  7:32 PM  Result Value Ref Range   Glucose-Capillary 126 (H) 70 - 99 mg/dL  MRSA PCR Screening     Status: None   Collection Time: 05/25/18  8:34 PM  Result Value Ref Range   MRSA by PCR NEGATIVE NEGATIVE    Comment:        The GeneXpert MRSA Assay (FDA approved for NASAL specimens only), is one component of a comprehensive MRSA colonization surveillance program. It is not intended to diagnose MRSA infection nor to guide or monitor treatment for MRSA  infections. Performed at [facility], [address] [place], [place] [address]   Glucose, capillary     Status: Abnormal   Collection Time: 05/25/18 10:58 PM  Result Value Ref Range   Glucose-Capillary 166 (H) 70 - 99 mg/dL   Comment 1 Notify RN    Comment 2 Document in Chart   CBC     Status: Abnormal   Collection Time: 05/26/18  3:58 AM  Result Value Ref Range   WBC 12.2 (H) 4.0 - 10.5 K/uL   RBC 3.83 (L) 4.22 - 5.81 MIL/uL   Hemoglobin 10.9 (L) 13.0 - 17.0 g/dL   HCT 35.7 (L) 39.0 - 52.0 %   MCV 93.2 78.0 - 100.0 fL   MCH 28.5 26.0 - 34.0 pg   MCHC 30.5 30.0 - 36.0 g/dL   RDW 17.3 (H) 11.5 - 15.5 %   Platelets 257 150 - 400 K/uL    Comment: Performed at [facility], [address] [place], [place] [address]  Basic metabolic panel     Status: Abnormal   Collection Time: 05/26/18  3:58 AM  Result Value Ref Range   Sodium 148 (H) 135 - 145 mmol/L   Potassium 3.6 3.5 - 5.1 mmol/L   Chloride 111 98 - 111 mmol/L   CO2 27 22 - 32 mmol/L   Glucose, Bld 150 (H) 70 - 99 mg/dL   BUN 32 (H) 8 - 23 mg/dL   Creatinine, Ser 1.78 (H)  0.61 - 1.24 mg/dL   Calcium 9.0 8.9 - 10.3 mg/dL   GFR calc non Af Amer 34 (L) >60 mL/min   GFR calc Af Amer 40 (L) >60 mL/min    Comment: (NOTE) The eGFR has been calculated using the CKD EPI equation. This calculation has not been validated in all clinical situations. eGFR's persistently <60 mL/min signify possible Chronic Kidney Disease.    Anion gap 10 5 - 15    Comment: Performed at [facility], [address], [place], [place] [address]  Magnesium     Status: None   Collection Time: 05/26/18  3:58 AM  Result Value Ref Range   Magnesium 2.2 1.7 - 2.4 mg/dL    Comment: Performed at [facility], [address], [place], [place] [address]  Phosphorus     Status: None   Collection Time: 05/26/18  3:58 AM  Result Value Ref Range   Phosphorus 3.4 2.5 - 4.6 mg/dL    Comment: Performed at [facility], [address] [place], [place] [address]   Glucose, capillary     Status: Abnormal   Collection Time: 05/26/18  4:33 AM  Result Value Ref Range   Glucose-Capillary 120 (H) 70 - 99 mg/dL  Glucose, capillary     Status: Abnormal   Collection Time: 05/26/18  7:17 AM  Result Value Ref Range   Glucose-Capillary 121 (H) 70 - 99 mg/dL  Blood gas, arterial     Status: None   Collection Time: 05/26/18  8:27 AM  Result Value Ref Range   FIO2 28.00    Delivery systems VENTILATOR    Mode PRESSURE REGULATED VOLUME CONTROL    VT 620 mL   LHR 18 resp/min   Peep/cpap 5.0 cm H20   pH, Arterial 7.439 7.350 - 7.450   pCO2 arterial 37.0 32.0 - 48.0 mmHg   pO2, Arterial 108 83.0 - 108.0 mmHg   Bicarbonate 25.4 20.0 - 28.0 mmol/L   Acid-Base Excess 0.9 0.0 - 2.0 mmol/L   O2 Saturation 98.0 %   Collection site LEFT RADIAL    Drawn by [id number]    Sample type ARTERIAL    Allens test (pass/fail) PASS PASS    Comment: Performed at [facility], [address] [place], [place] [address]  Glucose, capillary     Status: Abnormal   Collection Time: 05/26/18 11:55 AM  Result Value Ref Range   Glucose-Capillary 152 (H) 70 - 99 mg/dL  Glucose, capillary     Status: Abnormal   Collection Time: 05/26/18  4:13 PM  Result Value Ref Range   Glucose-Capillary 141 (H) 70 - 99 mg/dL  Magnesium     Status: None   Collection Time: 05/26/18  5:00 PM  Result Value Ref Range   Magnesium 2.1 1.7 - 2.4 mg/dL    Comment: Performed at [facility], [address] [place],  [address]  Phosphorus     Status: Abnormal   Collection Time: 05/26/18  5:00 PM  Result Value Ref Range   Phosphorus 2.2 (L) 2.5 - 4.6 mg/dL    Comment: Performed at [facility], [address], [place], [place] [address]  Glucose, capillary     Status: Abnormal   Collection Time: 05/26/18  9:03 PM  Result Value Ref Range   Glucose-Capillary 151 (H) 70 - 99 mg/dL  Glucose, capillary     Status: Abnormal   Collection Time: 05/26/18 11:31 PM  Result Value Ref Range    Glucose-Capillary 152 (H) 70 -  99 mg/dL  CBC     Status: Abnormal   Collection Time: 05/27/18  3:50 AM  Result Value Ref Range   WBC 8.6 4.0 - 10.5 K/uL   RBC 3.72 (L) 4.22 - 5.81 MIL/uL   Hemoglobin 10.3 (L) 13.0 - 17.0 g/dL   HCT 34.3 (L) 39.0 - 52.0 %   MCV 92.2 78.0 - 100.0 fL   MCH 27.7 26.0 - 34.0 pg   MCHC 30.0 30.0 - 36.0 g/dL   RDW 16.7 (H) 11.5 - 15.5 %   Platelets 255 150 - 400 K/uL    Comment: Performed at [facility], [address], [place], [place] [address]  Magnesium     Status: None   Collection Time: 05/27/18  3:50 AM  Result Value Ref Range   Magnesium 2.1 1.7 - 2.4 mg/dL    Comment: Performed at [facility], [address], [place], [place] [address]  Phosphorus     Status: None   Collection Time: 05/27/18  3:50 AM  Result Value Ref Range   Phosphorus 2.5 2.5 - 4.6 mg/dL    Comment: Performed at [facility], [address], [place], [place] [address]  Comprehensive metabolic panel     Status: Abnormal   Collection Time: 05/27/18  3:50 AM  Result Value Ref Range   Sodium 146 (H) 135 - 145 mmol/L   Potassium 3.4 (L) 3.5 - 5.1 mmol/L   Chloride 114 (H) 98 - 111 mmol/L   CO2 25 22 - 32 mmol/L   Glucose, Bld 147 (H) 70 - 99 mg/dL   BUN 20 8 - 23 mg/dL   Creatinine, Ser 1.12 0.61 - 1.24 mg/dL   Calcium 8.8 (L) 8.9 - 10.3 mg/dL   Total Protein 6.3 (L) 6.5 - 8.1 g/dL   Albumin 3.1 (L) 3.5 - 5.0 g/dL   AST 18 15 - 41 U/L   ALT 13 0 - 44 U/L   Alkaline Phosphatase 45 38 - 126 U/L   Total Bilirubin 0.6 0.3 - 1.2 mg/dL   GFR calc non Af Amer >60 >60 mL/min   GFR calc Af Amer >60 >60 mL/min    Comment: (NOTE) The eGFR has been calculated using the CKD EPI equation. This calculation has not been validated in all clinical situations. eGFR's persistently <60 mL/min signify possible Chronic Kidney Disease.    Anion gap 7 5 - 15    Comment: Performed at [facility], [address], [place], [place] [address]  Glucose, capillary     Status: Abnormal   Collection  Time: 05/27/18  3:55 AM  Result Value Ref Range   Glucose-Capillary 129 (H) 70 - 99 mg/dL  Blood gas, arterial     Status: Abnormal   Collection Time: 05/27/18  5:27 AM  Result Value Ref Range   FIO2 28.00    Delivery systems VENTILATOR    Mode PRESSURE REGULATED VOLUME CONTROL    VT 620 mL   LHR 18 resp/min   Peep/cpap 5.0 cm H20   pH, Arterial 7.458 (H) 7.350 - 7.450   pCO2 arterial 34.0 32.0 - 48.0 mmHg   pO2, Arterial 123 (H) 83.0 - 108.0 mmHg   Bicarbonate 25.0 20.0 - 28.0 mmol/L   Acid-Base Excess 0.3 0.0 - 2.0 mmol/L   O2 Saturation 98.7 %   Patient temperature 37.0    Collection site REVIEWED BY    Drawn by [id number]    Sample type ARTERIAL DRAW    Allens test (pass/fail) PASS PASS  Comment: Performed at [facility], [address], [place], [place] [address]  Glucose, capillary     Status: Abnormal   Collection Time: 05/27/18  7:30 AM  Result Value Ref Range   Glucose-Capillary 154 (H) 70 - 99 mg/dL    ABGS Recent Labs    05/27/18 0527  PHART 7.458*  PO2ART 123*  HCO3 25.0   CULTURES Recent Results (from the past 240 hour(s))  Culture, blood (routine x 2)     Status: Abnormal (Preliminary result)   Collection Time: 05/24/18  6:54 PM  Result Value Ref Range Status   Specimen Description   Final    BLOOD RIGHT HAND Performed at [facility], [address] [place], [place] [address]    Special Requests   Final    BOTTLES DRAWN AEROBIC AND ANAEROBIC Blood Culture adequate volume Performed at [facility], [address] [place], [place] [address]    Culture  Setup Time   Final    GRAM POSITIVE COCCI ANAEROBIC BOTTLE GRAM STAINED RESULT CALLED TO/READ BACK FROM MISTY SMART AT 1300  BY HFLYNT 05/25/18 CRITICAL RESULT CALLED TO, READ BACK BY AND VERIFIED WITH: RN E MURPHY [phone] 1824 MLM    Culture (A)  Final    STAPHYLOCOCCUS SPECIES (COAGULASE NEGATIVE) THE SIGNIFICANCE OF ISOLATING THIS ORGANISM FROM A SINGLE SET OF BLOOD CULTURES WHEN MULTIPLE SETS ARE  DRAWN IS UNCERTAIN. PLEASE NOTIFY THE MICROBIOLOGY DEPARTMENT WITHIN ONE WEEK IF SPECIATION AND SENSITIVITIES ARE REQUIRED. Performed at [facility], [place] [address], [place], [place] [address]    Report Status PENDING  Incomplete  Blood Culture ID Panel (Reflexed)     Status: Abnormal   Collection Time: 05/24/18  6:54 PM  Result Value Ref Range Status   Enterococcus species NOT DETECTED NOT DETECTED Final   Listeria monocytogenes NOT DETECTED NOT DETECTED Final   Staphylococcus species DETECTED (A) NOT DETECTED Final    Comment: Methicillin (oxacillin) resistant coagulase negative staphylococcus. Possible blood culture contaminant (unless isolated from more than one blood culture draw or clinical case suggests pathogenicity). No antibiotic treatment is indicated for blood  culture contaminants. CRITICAL RESULT CALLED TO, READ BACK BY AND VERIFIED WITH: RN E MURPHY [phone] 1824 MLM    Staphylococcus aureus NOT DETECTED NOT DETECTED Final   Methicillin resistance DETECTED (A) NOT DETECTED Final    Comment: CRITICAL RESULT CALLED TO, READ BACK BY AND VERIFIED WITH: RN E MURPHY [phone] 1824 MLM    Streptococcus species NOT DETECTED NOT DETECTED Final   Streptococcus agalactiae NOT DETECTED NOT DETECTED Final   Streptococcus pneumoniae NOT DETECTED NOT DETECTED Final   Streptococcus pyogenes NOT DETECTED NOT DETECTED Final   Acinetobacter baumannii NOT DETECTED NOT DETECTED Final   Enterobacteriaceae species NOT DETECTED NOT DETECTED Final   Enterobacter cloacae complex NOT DETECTED NOT DETECTED Final   Escherichia coli NOT DETECTED NOT DETECTED Final   Klebsiella oxytoca NOT DETECTED NOT DETECTED Final   Klebsiella pneumoniae NOT DETECTED NOT DETECTED Final   Proteus species NOT DETECTED NOT DETECTED Final   Serratia marcescens NOT DETECTED NOT DETECTED Final   Haemophilus influenzae NOT DETECTED NOT DETECTED Final   Neisseria meningitidis NOT DETECTED NOT DETECTED Final    Pseudomonas aeruginosa NOT DETECTED NOT DETECTED Final   Candida albicans NOT DETECTED NOT DETECTED Final   Candida glabrata NOT DETECTED NOT DETECTED Final   Candida krusei NOT DETECTED NOT DETECTED Final   Candida parapsilosis NOT DETECTED NOT DETECTED Final   Candida tropicalis NOT  DETECTED NOT DETECTED Final    Comment: Performed at [facility], [place] [address], [place], [place] [address]  Culture, blood (routine x 2)     Status: None (Preliminary result)   Collection Time: 05/24/18  7:03 PM  Result Value Ref Range Status   Specimen Description BLOOD LEFT HAND  Final   Special Requests   Final    BOTTLES DRAWN AEROBIC AND ANAEROBIC Blood Culture adequate volume   Culture   Final    NO GROWTH 3 DAYS Performed at [facility], [address] [place] [address] [place], [place] [address]    Report Status PENDING  Incomplete  MRSA PCR Screening     Status: None   Collection Time: 05/25/18  8:34 PM  Result Value Ref Range Status   MRSA by PCR NEGATIVE NEGATIVE Final    Comment:        The GeneXpert MRSA Assay (FDA approved for NASAL specimens only), is one component of a comprehensive MRSA colonization surveillance program. It is not intended to diagnose MRSA infection nor to guide or monitor treatment for MRSA infections. Performed at [facility], [address], [place], [place] [address]    Studies/Results: Dg Abd 1 View  Result Date: 05/26/2018 CLINICAL DATA:  OG tube placement. EXAM: ABDOMEN - 1 VIEW COMPARISON:  CT scan 09/01/2010 FINDINGS: Supine view the abdomen shows a nonspecific bowel gas pattern. On the CT scan from 8 years ago, the patient had a large hiatal hernia. Recent chest x-rays suggest persistence of the hiatal hernia. A similar the presence of a hiatal hernia, the OG tube is likely folded on itself within the hiatal hernia in the tube appears kinked at the level of the proximal port. IVC filter visualized in situ IMPRESSION: Assuming moderate to large hiatal hernia as  noted on previous CT of 09/01/2010, the OG tube is probably folded on itself within the hernia and kinked at the level of the proximal port. Electronically Signed   By: Misty Stanley M.D.   On: 05/26/2018 13:43   Dg Chest Port 1 View  Result Date: 05/26/2018 CLINICAL DATA:  Respiratory failure EXAM: PORTABLE CHEST 1 VIEW COMPARISON:  05/25/2018 FINDINGS: Cardiac shadow is within normal limits. Endotracheal tube is noted in satisfactory position. The lungs are well aerated bilaterally. Mild vascular congestion is seen. Patchy infiltrates are again identified in the bases. No new focal infiltrate is seen. No bony abnormality is noted. IMPRESSION: Stable bibasilar infiltrates. Mild vascular congestion. Electronically Signed   By: Inez Catalina M.D.   On: 05/26/2018 07:24   Dg Chest Port 1v Same Day  Result Date: 05/26/2018 CLINICAL DATA:  OG tube placement EXAM: PORTABLE CHEST 1 VIEW COMPARISON:  None. FINDINGS: Endotracheal tube with the tip 5.2 cm above the carina. Nasogastric tube visualized to the level of the diaphragm, but not seen below the diaphragm secondary to technique. Recommend a KUB for re-evaluation. No focal consolidation, pleural effusion or pneumothorax. Stable cardiomediastinal silhouette. Severe osteoarthritis of the right glenohumeral joint. No acute osseous abnormality. IMPRESSION: 1. Endotracheal tube with the tip 5.2 cm above the carina. 2. Nasogastric tube visualized to the level of the diaphragm, but not seen below the diaphragm secondary to technique. Recommend a KUB for re-evaluation. Electronically Signed   By: Kathreen Devoid   On: 05/26/2018 11:12    Medications:  Prior to Admission:  Medications Prior to Admission  Medication Sig Dispense Refill Last Dose  . Acetaminophen 500 MG coapsule Take 500 mg by mouth as  needed for pain.    unknown  . amLODipine (NORVASC) 5 MG tablet TAKE 1 TABLET BY MOUTH DAILY. (Patient taking differently: Take 5 mg by mouth daily. ) 90 tablet 1  05/22/2018 at Unknown time  . atorvastatin (LIPITOR) 40 MG tablet Take 1 tablet (40 mg total) by mouth daily. 90 tablet 1 05/07/2018 at Unknown time  . benazepril (LOTENSIN) 40 MG tablet Take 1 tablet (40 mg total) by mouth 2 (two) times daily. 180 tablet 1 05/24/2018 at Unknown time  . betamethasone dipropionate 0.05 % lotion Apply topically 2 (two) times daily. (Patient taking differently: Apply 1 application topically 2 (two) times daily as needed (for irritation). ) 120 mL 0 unknown  . colchicine 0.6 MG tablet Take 1 tablet (0.6 mg total) by mouth daily. Continue daily until you have had no pain in your foot for 2 days.  Then stop. (Patient taking differently: Take 0.6 mg by mouth daily as needed (for gout pain as directed). Continue daily until you have had no pain in your foot for 2 days.  Then stop.) 20 tablet 0 unknown  . fenofibrate 160 MG tablet TAKE 1 TABLET EVERY DAY (Patient taking differently: Take 160 mg by mouth daily. ) 90 tablet 0 05/16/2018 at Unknown time  . furosemide (LASIX) 20 MG tablet TAKE 1 TABLET TWICE DAILY (Patient taking differently: Take 20 mg by mouth 2 (two) times daily. TAKE 1 TABLET TWICE DAILY) 180 tablet 1 06/04/2018 at Unknown time  . gabapentin (NEURONTIN) 400 MG capsule TAKE 2 CAPSULES BY MOUTH 2 (TWO) TIMES DAILY. (Patient taking differently: Take 800 mg by mouth 2 (two) times daily. ) 360 capsule 0 05/17/2018 at Unknown time  . metFORMIN (GLUCOPHAGE) 500 MG tablet Take 500 mg by mouth 2 (two) times daily with a meal.    05/09/2018 at Unknown time  . potassium chloride SA (K-DUR,KLOR-CON) 20 MEQ tablet TAKE 2 TABLETS TWICE DAILY (Patient taking differently: Take 40 mEq by mouth 2 (two) times daily. ) 360 tablet 1 05/31/2018 at Unknown time  . rivaroxaban (XARELTO) 20 MG TABS tablet Take 20 mg by mouth daily with supper.    05/24/2018 at Unknown time   Scheduled: . aspirin  300 mg Rectal Daily  . atorvastatin  80 mg Per Tube q1800  . chlorhexidine gluconate (MEDLINE KIT)   15 mL Mouth Rinse BID  . enoxaparin (LOVENOX) injection  80 mg Subcutaneous Q12H  . feeding supplement (PRO-STAT SUGAR FREE 64)  30 mL Per Tube Daily  . insulin aspart  0-15 Units Subcutaneous Q4H  . ipratropium-albuterol  3 mL Nebulization Q6H  . mouth rinse  15 mL Mouth Rinse 10 times per day  . metoprolol tartrate  5 mg Intravenous Q6H  . sodium chloride flush  3 mL Intravenous Q12H   Continuous: . sodium chloride 10 mL/hr at 05/27/18 0500  . ampicillin-sulbactam (UNASYN) IV 3 g (05/27/18 0738)  . dextrose 5 % 1,000 mL with potassium chloride 40 mEq infusion 100 mL/hr at 05/27/18 0600  . famotidine (PEPCID) IV    . feeding supplement (VITAL AF 1.2 CAL)     ZOX:WRUEAV chloride, acetaminophen **OR** acetaminophen, albuterol, docusate, fentaNYL (SUBLIMAZE) injection, fentaNYL (SUBLIMAZE) injection, midazolam, midazolam, ondansetron **OR** ondansetron (ZOFRAN) IV, sodium chloride flush  Assesment: He was admitted with a CVA.  He then developed respiratory failure from aspiration pneumonia.  He remains on the ventilator.  He had atrial fib with RVR and he remains in atrial fib but his heart rate is controlled  He has hypertension which is well controlled  He has acute kidney injury and the problem has been trying to give him some diuresis because of his diastolic heart failure without affecting his creatinine.  He has diabetes on sliding scale insulin  He has nutritional problems and we have attempted tube feeding but the feeding tube keeps getting coiled in the hiatal hernia.  He may require TPN Principal Problem:   CVA (cerebral vascular accident) [facility]) Active Problems:   Essential hypertension   Peripheral neuropathy   Hyperlipidemia   Hypokalemia   Diabetes mellitus with neuropathy (HCC)   Paroxysmal atrial fibrillation (HCC)   AKI (acute kidney injury) ([place])   Chronic diastolic congestive heart failure (HCC)   Chronic anticoagulation   Iron deficiency anemia due to  chronic blood loss   Acute respiratory failure (HCC)   Aspiration pneumonia of both lower lobes due to gastric secretions (HCC)   Malnutrition of moderate degree   Pressure injury of skin    Plan: Continue current treatments    LOS: 4 days   Alixis Harmon L 05/27/2018, 8:37 AM

## 2018-05-27 NOTE — Progress Notes (Signed)
PROGRESS NOTE    Keith Doyle  CVE:[id number] DOB: March 15, 1936 DOA: 05/12/2018 PCP: Janora Norlander, DO    Brief Narrative:  82 y/o male admitted to the hospital with acute right basal ganglia CVA. Patient became increasingly lethargic and began having fevers. This was followed by development of respiratory distress and he became unresponsive. He was intubated for airway protection and respiratory distress. He was transferred to ICU. I suspect he may have aspirated. He was started on IV antibiotics.   Assessment & Plan:   Principal Problem:   CVA (cerebral vascular accident) [facility]) Active Problems:   Essential hypertension   Peripheral neuropathy   Hyperlipidemia   Hypokalemia   Diabetes mellitus with neuropathy (HCC)   Paroxysmal atrial fibrillation (HCC)   AKI (acute kidney injury) ([place])   Chronic diastolic congestive heart failure (HCC)   Chronic anticoagulation   Iron deficiency anemia due to chronic blood loss   Acute respiratory failure (HCC)   Aspiration pneumonia of both lower lobes due to gastric secretions (HCC)   Malnutrition of moderate degree   Pressure injury of skin   1. Acute CVA. Patient had presented with acute onset of LLE weakness. MRI brain confirms right basal ganglia infarct. MRA head shows some atherosclerotic disease on the left side. Carotid dopplers did not show any significant stenosis bilaterally. Echo did not show any significant findings. Seen by physical therapy who recommended SNF. He is chronically on xarelto, but this has been switched to lovenox since he did not pass his swallow evaluation. Neurology has evaluated the patient. Patient has required minimal sedation and is not very responsive. Since he is on full dose anticoagulation, will repeat CT head. Will also check EEG to evaluate for subclinical seizures. 2. Acute respiratory failure with hypoxia. Related to aspiration pneumonia. Patient became unresponsive, febrile and developed respiratory  distress. He was intubated for airway protection and respiratory distress on 9/18.  He is currently on intravenous antibiotics and overall fevers appear to be improving.  He has had 1 out of 2 positive blood cultures for coagulase-negative staph.  Suspect this is a contaminant.  Pulmonology following. Lactic acid initially elevated, but has since trended down.  He will need to remain on the ventilator for now.  Multiple attempts have been made to pass OG to provide medications/feeding.  This repeatedly gets coiled in his hiatal hernia.  May need to consider PICC line for TPN if he is not able to extubate soon. Continue current treatments 3. Paroxysmal atrial fibrillation on chronic Xarelto.  Currently on therapeutic dose Lovenox. Heart rate is currently stable on lopressor. Can resume xarelto once acute issues have resolved. 4. HLD.  Will resume statin once able to take p.o. medications.  LDL is above goal considering he has had a stroke. 5. Peripheral neuropathy. Gabapentin was placed on hold due to altered mental status. 6. Bilateral lower extremity edema. Mild elevation of BNP. Echo shows normal EF.  Initially had good urine output with Lasix, but further diuretics were held due to increasing creatinine 7. AKI. Likely related to diuretics.  Improved with IV fluids. Will discontinue further IV fluids, so as not to precipitate CHF exacerbation 8. Diabetes. Metformin currently on hold. Continue sliding scale insulin.  Blood sugars have been stable   DVT prophylaxis: Full dose Lovenox Code Status: full code Family Communication: discussed with daughter at the bedside Disposition Plan: Patient will remain in the ICU at this time for ventilator management   Consultants:   Neurology  Pulmonology  Procedures:  Echo: - Left ventricle: The cavity size was normal. Wall thickness was   increased increased in a pattern of mild to moderate LVH.   Systolic function was normal. The estimated ejection  fraction was   in the range of 60% to 65%. Wall motion was normal; there were no   regional wall motion abnormalities. - Aortic valve: Mildly calcified annulus. Trileaflet. - Aortic root: The aortic root was mildly ectatic. - Mitral valve: There was trivial regurgitation. - Left atrium: The atrium was moderately dilated. - Right atrium: The atrium was mildly dilated. Central venous   pressure (est): 8 mm Hg. - Atrial septum: No defect or patent foramen ovale was identified. - Tricuspid valve: There was trivial regurgitation.  - Pericardium, extracardiac: There was no pericardial effusion.  Intubation 9/18 >  Antimicrobials:   Zosyn 9/18 > 9/19  Unasyn 9/19 >   Subjective: On ventilator. Does not open eyes to voice, had one dose of sedation overnight  Objective: Vitals:   05/27/18 0400 05/27/18 0500 05/27/18 0524 05/27/18 0600  BP: (!) 155/91 (!) 177/98  (!) 171/93  Pulse: 89 78  79  Resp: _0 Temp: 99.3 F (37.4 C) 99.1 F (37.3 C)  98.8 F (37.1 C)  TempSrc:      SpO2: 100% 100% 100% 100%  Weight:  85 kg    Height:        Intake/Output Summary (Last 24 hours) at 05/27/2018 0849 Last data filed at 05/27/2018 0600 Gross per 24 hour  Intake 2983.58 ml  Output 750 ml  Net 2233.58 ml   Filed Weights   05/25/18 0500 05/26/18 0400 05/27/18 0500  Weight: 82.2 kg 83.4 kg 85 kg    Examination:  General exam: unresponsive on ventilator Respiratory system: Clear to auscultation. Respiratory effort normal. Cardiovascular system:RRR. No murmurs, rubs, gallops. Gastrointestinal system: Abdomen is nondistended, soft and nontender. No organomegaly or masses felt. Normal bowel sounds heard. Central nervous system: limited exam due to mental status. Spontaneous movement of right side, no movement on left Extremities: 1+ edema bilaterally Skin: No rashes, lesions or ulcers Psychiatry: unresponsive   Data Reviewed: I have personally reviewed following labs and  imaging studies  CBC: Recent Labs  Lab 05/19/2018 1743 06/02/2018 2232 05/25/18 0440 05/26/18 0358 05/27/18 0350  WBC 6.5 6.3 11.1* 12.2* 8.6  NEUTROABS 4.3  --   --   --   --   HGB 10.5* 10.2* 10.9* 10.9* 10.3*  HCT 33.9* 33.6* 35.7* 35.7* 34.3*  MCV 91.4 90.6 92.2 93.2 92.2  PLT 260 250 263 257 704   Basic Metabolic Panel: Recent Labs  Lab 05/21/2018 1743 05/24/18 0555 05/25/18 0440 05/26/18 0358 05/26/18 1700 05/27/18 0350  NA 141 148* 150* 148*  --  146*  K 3.3* 3.4* 3.2* 3.6  --  3.4*  CL 108 113* 113* 111  --  114*  CO2 _1 --  25  GLUCOSE 111* 154* 161* 150*  --  147*  BUN 15 13 25* 32*  --  20  CREATININE 1.11 1.02 1.75* 1.78*  --  1.12  CALCIUM 9.6 9.6 9.2 9.0  --  8.8*  MG  --  2.0  --  2.2 2.1 2.1  PHOS  --   --   --  3.4 2.2* 2.5   GFR: Estimated Creatinine Clearance: 57.7 mL/min (by C-G formula based on SCr of 1.12 mg/dL). Liver Function Tests: Recent Labs  Lab 06/04/2018 1743 05/25/18  0440 05/27/18 0350  AST _0 ALT _1 ALKPHOS 51 43 45  BILITOT 0.5 0.9 0.6  PROT 7.2 6.7 6.3*  ALBUMIN 4.0 3.5 3.1*   No results for input(s): LIPASE, AMYLASE in the last 168 hours. Recent Labs  Lab 05/24/18 1854  AMMONIA 33   Coagulation Profile: Recent Labs  Lab 06/01/2018 1743  INR 1.10   Cardiac Enzymes: Recent Labs  Lab 05/16/2018 1743  TROPONINI <0.03   BNP (last 3 results) No results for input(s): PROBNP in the last 8760 hours. HbA1C: No results for input(s): HGBA1C in the last 72 hours. CBG: Recent Labs  Lab 05/26/18 1613 05/26/18 2103 05/26/18 2331 05/27/18 0355 05/27/18 0730  GLUCAP 141* 151* 152* 129* 154*   Lipid Profile: No results for input(s): CHOL, HDL, LDLCALC, TRIG, CHOLHDL, LDLDIRECT in the last 72 hours. Thyroid Function Tests: No results for input(s): TSH, T4TOTAL, FREET4, T3FREE, THYROIDAB in the last 72 hours. Anemia Panel: No results for input(s): VITAMINB12, FOLATE, FERRITIN, TIBC, IRON, RETICCTPCT in  the last 72 hours. Sepsis Labs: Recent Labs  Lab 05/24/18 1854 05/24/18 2211  LATICACIDVEN 2.1* 1.5    Recent Results (from the past 240 hour(s))  Culture, blood (routine x 2)     Status: Abnormal   Collection Time: 05/24/18  6:54 PM  Result Value Ref Range Status   Specimen Description   Final    BLOOD RIGHT HAND Performed at [facility], [address] [place], [place] [address]    Special Requests   Final    BOTTLES DRAWN AEROBIC AND ANAEROBIC Blood Culture adequate volume Performed at [facility], [address], [place], [place] [address]    Culture  Setup Time   Final    GRAM POSITIVE COCCI ANAEROBIC BOTTLE GRAM STAINED RESULT CALLED TO/READ BACK FROM MISTY SMART AT 1300  BY HFLYNT 05/25/18 CRITICAL RESULT CALLED TO, READ BACK BY AND VERIFIED WITH: RN E MURPHY [phone] 1824 MLM    Culture (A)  Final    STAPHYLOCOCCUS SPECIES (COAGULASE NEGATIVE) THE SIGNIFICANCE OF ISOLATING THIS ORGANISM FROM A SINGLE SET OF BLOOD CULTURES WHEN MULTIPLE SETS ARE DRAWN IS UNCERTAIN. PLEASE NOTIFY THE MICROBIOLOGY DEPARTMENT WITHIN ONE WEEK IF SPECIATION AND SENSITIVITIES ARE REQUIRED. Performed at [facility], [place] [address] [place], [place] [address]    Report Status 05/27/2018 FINAL  Final  Blood Culture ID Panel (Reflexed)     Status: Abnormal   Collection Time: 05/24/18  6:54 PM  Result Value Ref Range Status   Enterococcus species NOT DETECTED NOT DETECTED Final   Listeria monocytogenes NOT DETECTED NOT DETECTED Final   Staphylococcus species DETECTED (A) NOT DETECTED Final    Comment: Methicillin (oxacillin) resistant coagulase negative staphylococcus. Possible blood culture contaminant (unless isolated from more than one blood culture draw or clinical case suggests pathogenicity). No antibiotic treatment is indicated for blood  culture contaminants. CRITICAL RESULT CALLED TO, READ BACK BY AND VERIFIED WITH: RN E MURPHY [phone] 1824 MLM    Staphylococcus aureus NOT  DETECTED NOT DETECTED Final   Methicillin resistance DETECTED (A) NOT DETECTED Final    Comment: CRITICAL RESULT CALLED TO, READ BACK BY AND VERIFIED WITH: RN E MURPHY [phone] 1824 MLM    Streptococcus species NOT DETECTED NOT DETECTED Final   Streptococcus agalactiae NOT DETECTED NOT DETECTED Final   Streptococcus pneumoniae NOT DETECTED NOT DETECTED Final   Streptococcus pyogenes NOT DETECTED NOT DETECTED Final   Acinetobacter baumannii NOT DETECTED NOT DETECTED  Final   Enterobacteriaceae species NOT DETECTED NOT DETECTED Final   Enterobacter cloacae complex NOT DETECTED NOT DETECTED Final   Escherichia coli NOT DETECTED NOT DETECTED Final   Klebsiella oxytoca NOT DETECTED NOT DETECTED Final   Klebsiella pneumoniae NOT DETECTED NOT DETECTED Final   Proteus species NOT DETECTED NOT DETECTED Final   Serratia marcescens NOT DETECTED NOT DETECTED Final   Haemophilus influenzae NOT DETECTED NOT DETECTED Final   Neisseria meningitidis NOT DETECTED NOT DETECTED Final   Pseudomonas aeruginosa NOT DETECTED NOT DETECTED Final   Candida albicans NOT DETECTED NOT DETECTED Final   Candida glabrata NOT DETECTED NOT DETECTED Final   Candida krusei NOT DETECTED NOT DETECTED Final   Candida parapsilosis NOT DETECTED NOT DETECTED Final   Candida tropicalis NOT DETECTED NOT DETECTED Final    Comment: Performed at [facility], [place] [address], [place], [place] [address]  Culture, blood (routine x 2)     Status: None (Preliminary result)   Collection Time: 05/24/18  7:03 PM  Result Value Ref Range Status   Specimen Description BLOOD LEFT HAND  Final   Special Requests   Final    BOTTLES DRAWN AEROBIC AND ANAEROBIC Blood Culture adequate volume   Culture   Final    NO GROWTH 3 DAYS Performed at [facility], [address], [place], [place] [address]    Report Status PENDING  Incomplete  MRSA PCR Screening     Status: None   Collection Time: 05/25/18  8:34 PM  Result Value Ref Range Status    MRSA by PCR NEGATIVE NEGATIVE Final    Comment:        The GeneXpert MRSA Assay (FDA approved for NASAL specimens only), is one component of a comprehensive MRSA colonization surveillance program. It is not intended to diagnose MRSA infection nor to guide or monitor treatment for MRSA infections. Performed at [facility], [address], [place], [place] [address]          Radiology Studies: Dg Abd 1 View  Result Date: 05/26/2018 CLINICAL DATA:  OG tube placement. EXAM: ABDOMEN - 1 VIEW COMPARISON:  CT scan 09/01/2010 FINDINGS: Supine view the abdomen shows a nonspecific bowel gas pattern. On the CT scan from 8 years ago, the patient had a large hiatal hernia. Recent chest x-rays suggest persistence of the hiatal hernia. A similar the presence of a hiatal hernia, the OG tube is likely folded on itself within the hiatal hernia in the tube appears kinked at the level of the proximal port. IVC filter visualized in situ IMPRESSION: Assuming moderate to large hiatal hernia as noted on previous CT of 09/01/2010, the OG tube is probably folded on itself within the hernia and kinked at the level of the proximal port. Electronically Signed   By: Misty Stanley M.D.   On: 05/26/2018 13:43   Dg Chest Port 1 View  Result Date: 05/26/2018 CLINICAL DATA:  Respiratory failure EXAM: PORTABLE CHEST 1 VIEW COMPARISON:  05/25/2018 FINDINGS: Cardiac shadow is within normal limits. Endotracheal tube is noted in satisfactory position. The lungs are well aerated bilaterally. Mild vascular congestion is seen. Patchy infiltrates are again identified in the bases. No new focal infiltrate is seen. No bony abnormality is noted. IMPRESSION: Stable bibasilar infiltrates. Mild vascular congestion. Electronically Signed   By: Inez Catalina M.D.   On: 05/26/2018 07:24   Dg Chest Port 1v Same Day  Result Date: 05/26/2018 CLINICAL DATA:  OG tube placement EXAM: PORTABLE CHEST 1  VIEW COMPARISON:  None. FINDINGS:  Endotracheal tube with the tip 5.2 cm above the carina. Nasogastric tube visualized to the level of the diaphragm, but not seen below the diaphragm secondary to technique. Recommend a KUB for re-evaluation. No focal consolidation, pleural effusion or pneumothorax. Stable cardiomediastinal silhouette. Severe osteoarthritis of the right glenohumeral joint. No acute osseous abnormality. IMPRESSION: 1. Endotracheal tube with the tip 5.2 cm above the carina. 2. Nasogastric tube visualized to the level of the diaphragm, but not seen below the diaphragm secondary to technique. Recommend a KUB for re-evaluation. Electronically Signed   By: Kathreen Devoid   On: 05/26/2018 11:12        Scheduled Meds: . aspirin  300 mg Rectal Daily  . atorvastatin  80 mg Per Tube q1800  . chlorhexidine gluconate (MEDLINE KIT)  15 mL Mouth Rinse BID  . enoxaparin (LOVENOX) injection  80 mg Subcutaneous Q12H  . feeding supplement (PRO-STAT SUGAR FREE 64)  30 mL Per Tube Daily  . insulin aspart  0-15 Units Subcutaneous Q4H  . ipratropium-albuterol  3 mL Nebulization Q6H  . mouth rinse  15 mL Mouth Rinse 10 times per day  . metoprolol tartrate  5 mg Intravenous Q6H  . sodium chloride flush  3 mL Intravenous Q12H   Continuous Infusions: . sodium chloride 10 mL/hr at 05/27/18 0500  . ampicillin-sulbactam (UNASYN) IV 3 g (05/27/18 0738)  . dextrose 5 % 1,000 mL with potassium chloride 40 mEq infusion 100 mL/hr at 05/27/18 0600  . famotidine (PEPCID) IV    . feeding supplement (VITAL AF 1.2 CAL)       LOS: 4 days    Critical care time spent: 45mns.  Patient remains critically ill with aspiration pneumonia and is needing continued intubation with mechanical ventilation. He needs continued monitoring in the ICU    JKathie Dike MD [facility] Pager [phone] If 7PM-7AM, please contact night-coverage [email or web address] Password [email or web address]9/21/2019, 8:49 AM

## 2018-05-28 LAB — BLOOD GAS, ARTERIAL
Acid-Base Excess: 1.1 mmol/L (ref 0.0–2.0)
Bicarbonate: 25.7 mmol/L (ref 20.0–28.0)
DRAWN BY: 382351
FIO2: 35
MECHVT: 620 mL
O2 Saturation: 97.1 %
PCO2 ART: 34.6 mmHg (ref 32.0–48.0)
PEEP/CPAP: 5 cmH2O
PH ART: 7.466 — AB (ref 7.350–7.450)
PO2 ART: 96.9 mmHg (ref 83.0–108.0)
Patient temperature: 38.1
RATE: 18 resp/min

## 2018-05-28 LAB — BASIC METABOLIC PANEL
Anion gap: 8 (ref 5–15)
BUN: 17 mg/dL (ref 8–23)
CALCIUM: 9 mg/dL (ref 8.9–10.3)
CO2: 24 mmol/L (ref 22–32)
CREATININE: 0.9 mg/dL (ref 0.61–1.24)
Chloride: 117 mmol/L — ABNORMAL HIGH (ref 98–111)
GFR calc Af Amer: >60 mL/min (ref >60)
Glucose, Bld: 139 mg/dL — ABNORMAL HIGH (ref 70–99)
Potassium: 3.4 mmol/L — ABNORMAL LOW (ref 3.5–5.1)
SODIUM: 149 mmol/L — AB (ref 135–145)

## 2018-05-28 LAB — GLUCOSE, CAPILLARY
GLUCOSE-CAPILLARY: 133 mg/dL — AB (ref 70–99)
GLUCOSE-CAPILLARY: 135 mg/dL — AB (ref 70–99)
GLUCOSE-CAPILLARY: 163 mg/dL — AB (ref 70–99)
Glucose-Capillary: 114 mg/dL — ABNORMAL HIGH (ref 70–99)
Glucose-Capillary: 131 mg/dL — ABNORMAL HIGH (ref 70–99)
Glucose-Capillary: 148 mg/dL — ABNORMAL HIGH (ref 70–99)

## 2018-05-28 LAB — CBC
HEMATOCRIT: 31.6 % — AB (ref 39.0–52.0)
Hemoglobin: 9.5 g/dL — ABNORMAL LOW (ref 13.0–17.0)
MCH: 27.6 pg (ref 26.0–34.0)
MCHC: 30.1 g/dL (ref 30.0–36.0)
MCV: 91.9 fL (ref 78.0–100.0)
Platelets: 252 10*3/uL (ref 150–400)
RBC: 3.44 MIL/uL — ABNORMAL LOW (ref 4.22–5.81)
RDW: 16.4 % — ABNORMAL HIGH (ref 11.5–15.5)
WBC: 5.9 10*3/uL (ref 4.0–10.5)

## 2018-05-28 NOTE — Progress Notes (Signed)
PROGRESS NOTE    Keith Doyle  OJJ:[id number] DOB: 25-Feb-1936 DOA: 05/16/2018 PCP: Janora Norlander, DO    Brief Narrative:  82 y/o male admitted to the hospital with left sided weakness and acute right basal ganglia CVA. Shortly after admission, patient became increasingly lethargic and began having fevers. This was followed by development of respiratory distress and he became unresponsive. He was intubated for airway protection and respiratory distress. His decompensation was secondary to aspiration pneumonia. He is being treated with IV antibiotics, but has been slow to progress. Main barrier to weaning at this point has been his mental status and inability to participate in weaning. Pulmonology and neurology following. Will also consult palliative care to help address goals of care.   Assessment & Plan:   Principal Problem:   CVA (cerebral vascular accident) [facility]) Active Problems:   Essential hypertension   Peripheral neuropathy   Hyperlipidemia   Hypokalemia   Diabetes mellitus with neuropathy (HCC)   Paroxysmal atrial fibrillation (HCC)   AKI (acute kidney injury) ([place])   Chronic diastolic congestive heart failure (HCC)   Chronic anticoagulation   Iron deficiency anemia due to chronic blood loss   Acute respiratory failure (HCC)   Aspiration pneumonia of both lower lobes due to gastric secretions (HCC)   Malnutrition of moderate degree   Pressure injury of skin   1. Acute CVA. Patient had presented with acute onset of LLE weakness. MRI brain confirmed right basal ganglia infarct. MRA head showed some atherosclerotic disease on the left side. Carotid dopplers did not show any significant stenosis bilaterally. Echo did not show any significant findings. Seen by physical therapy who recommended SNF. He is chronically on xarelto, but this has been switched to lovenox since he did not pass his swallow evaluation. Neurology has evaluated the patient. Patient has required minimal  sedation on ventilator and has not been very responsive. Repeat CT head was unrevealing. EEG has been ordered. 2. Acute respiratory failure with hypoxia. Related to aspiration pneumonia. Shortly after admission, patient became unresponsive, febrile and developed respiratory distress. He was intubated for airway protection and respiratory distress on 9/18.  He was started on intravenous antibiotics and has not had recurrent high fever spikes, but continues to have low grade temps.  He has had 1 out of 2 positive blood cultures for coagulase-negative staph which is suspected to be a contaminant.  Pulmonology following. Lactic acid was initially elevated, but has since trended down.  He will need to remain on the ventilator for now.  Multiple attempts have been made to pass OG to provide medications/feeding.  This repeatedly gets coiled in his hiatal hernia.  May need to consider PICC line for TPN if he is not able to extubate soon. Continue current treatments 3. Paroxysmal atrial fibrillation on chronic Xarelto.  Currently on therapeutic dose Lovenox. Heart rate is currently stable on lopressor. Can resume xarelto once acute issues have resolved. 4. HLD.  Will resume statin once able to take p.o. medications.  LDL is above goal considering he has had a stroke. 5. Peripheral neuropathy. Gabapentin was placed on hold due to altered mental status. 6. Bilateral lower extremity edema. Mild elevation of BNP. Echo shows normal EF.  Initially had good urine output with Lasix, but further diuretics were held due to increasing creatinine 7. AKI. Likely related to diuretics.  Improved with IV fluids.  8. Diabetes. Metformin currently on hold. Continue sliding scale insulin.  Blood sugars have been stable 9. Goals of care.  Will request palliative care consultation. Patient has multiple medical problems that are affecting his quality of life. Mentally, he has not been responding well on the ventilator and has not been able  to engage in weaning process. He also has significant swallowing issues which precipitated aspiration. It would be helpful to clearly identify goals and how patient would want to approach these difficult questions in terms of possible PEG tube. Certainly, family will have a difficult time coming to terms with this, especially since patient was so functional (independent/driving) prior to stroke.   DVT prophylaxis: Full dose Lovenox Code Status: full code Family Communication: discussed with daughter at the bedside Disposition Plan: Patient will remain in the ICU at this time for ventilator management   Consultants:   Neurology  Pulmonology  Procedures:  Echo: - Left ventricle: The cavity size was normal. Wall thickness was   increased increased in a pattern of mild to moderate LVH.   Systolic function was normal. The estimated ejection fraction was   in the range of 60% to 65%. Wall motion was normal; there were no   regional wall motion abnormalities. - Aortic valve: Mildly calcified annulus. Trileaflet. - Aortic root: The aortic root was mildly ectatic. - Mitral valve: There was trivial regurgitation. - Left atrium: The atrium was moderately dilated. - Right atrium: The atrium was mildly dilated. Central venous   pressure (est): 8 mm Hg. - Atrial septum: No defect or patent foramen ovale was identified. - Tricuspid valve: There was trivial regurgitation.  - Pericardium, extracardiac: There was no pericardial effusion.  Intubation 9/18 >  Antimicrobials:   Zosyn 9/18 > 9/19  Unasyn 9/19 >   Subjective: Remains on ventilator. Family feels he was more responsive yesterday.   Objective: Vitals:   05/28/18 1230 05/28/18 1300 05/28/18 1400 05/28/18 1431  BP:  113/72 (!) 148/74   Pulse:  88 88   Resp:  18 18   Temp:  100.2 F (37.9 C) 100 F (37.8 C)   TempSrc:      SpO2: 99% 96% 100% 100%  Weight:      Height:        Intake/Output Summary (Last 24 hours) at  05/28/2018 1456 Last data filed at 05/28/2018 1400 Gross per 24 hour  Intake 1062.55 ml  Output 1575 ml  Net -512.45 ml   Filed Weights   05/26/18 0400 05/27/18 0500 05/28/18 0430  Weight: 83.4 kg 85 kg 81.6 kg    Examination: General exam: intubated and unresponsive Respiratory system: Clear to auscultation. Respiratory effort normal. Cardiovascular system:RRR. No murmurs, rubs, gallops. Gastrointestinal system: Abdomen is nondistended, soft and nontender. No organomegaly or masses felt. Normal bowel sounds heard. Central nervous system: limited exam due to mental status Extremities: 1+ edema bilaterally Skin: No rashes, lesions or ulcers Psychiatry: unresponsive   Data Reviewed: I have personally reviewed following labs and imaging studies  CBC: Recent Labs  Lab 05/16/2018 1743 05/12/2018 2232 05/25/18 0440 05/26/18 0358 05/27/18 0350 05/28/18 0551  WBC 6.5 6.3 11.1* 12.2* 8.6 5.9  NEUTROABS 4.3  --   --   --   --   --   HGB 10.5* 10.2* 10.9* 10.9* 10.3* 9.5*  HCT 33.9* 33.6* 35.7* 35.7* 34.3* 31.6*  MCV 91.4 90.6 92.2 93.2 92.2 91.9  PLT 260 250 263 257 255 790   Basic Metabolic Panel: Recent Labs  Lab 05/24/18 0555 05/25/18 0440 05/26/18 0358 05/26/18 1700 05/27/18 0350 05/27/18 1615 05/28/18 0551  NA 148* 150* 148*  --  146*  --  149*  K 3.4* 3.2* 3.6  --  3.4*  --  3.4*  CL 113* 113* 111  --  114*  --  117*  CO2 '27 27 27  ' --  25  --  24  GLUCOSE 154* 161* 150*  --  147*  --  139*  BUN 13 25* 32*  --  20  --  17  CREATININE 1.02 1.75* 1.78*  --  1.12  --  0.90  CALCIUM 9.6 9.2 9.0  --  8.8*  --  9.0  MG 2.0  --  2.2 2.1 2.1 2.0  --   PHOS  --   --  3.4 2.2* 2.5 2.6  --    GFR: Estimated Creatinine Clearance: 71.9 mL/min (by C-G formula based on SCr of 0.9 mg/dL). Liver Function Tests: Recent Labs  Lab 05/31/2018 1743 05/25/18 0440 05/27/18 0350  AST '18 16 18  ' ALT '17 16 13  ' ALKPHOS 51 43 45  BILITOT 0.5 0.9 0.6  PROT 7.2 6.7 6.3*  ALBUMIN 4.0 3.5  3.1*   No results for input(s): LIPASE, AMYLASE in the last 168 hours. Recent Labs  Lab 05/24/18 1854  AMMONIA 33   Coagulation Profile: Recent Labs  Lab 05/09/2018 1743  INR 1.10   Cardiac Enzymes: Recent Labs  Lab 05/28/2018 1743  TROPONINI <0.03   BNP (last 3 results) No results for input(s): PROBNP in the last 8760 hours. HbA1C: No results for input(s): HGBA1C in the last 72 hours. CBG: Recent Labs  Lab 05/27/18 2041 05/27/18 2308 05/28/18 0423 05/28/18 0732 05/28/18 1131  GLUCAP 129* 117* 133* 135* 114*   Lipid Profile: No results for input(s): CHOL, HDL, LDLCALC, TRIG, CHOLHDL, LDLDIRECT in the last 72 hours. Thyroid Function Tests: No results for input(s): TSH, T4TOTAL, FREET4, T3FREE, THYROIDAB in the last 72 hours. Anemia Panel: No results for input(s): VITAMINB12, FOLATE, FERRITIN, TIBC, IRON, RETICCTPCT in the last 72 hours. Sepsis Labs: Recent Labs  Lab 05/24/18 1854 05/24/18 2211  LATICACIDVEN 2.1* 1.5    Recent Results (from the past 240 hour(s))  Culture, blood (routine x 2)     Status: Abnormal   Collection Time: 05/24/18  6:54 PM  Result Value Ref Range Status   Specimen Description   Final    BLOOD RIGHT HAND Performed at [facility], [address], [place], [place] [address]    Special Requests   Final    BOTTLES DRAWN AEROBIC AND ANAEROBIC Blood Culture adequate volume Performed at [facility], [address] [place], [place] [address]    Culture  Setup Time   Final    GRAM POSITIVE COCCI ANAEROBIC BOTTLE GRAM STAINED RESULT CALLED TO/READ BACK FROM MISTY SMART AT 1300  BY HFLYNT 05/25/18 CRITICAL RESULT CALLED TO, READ BACK BY AND VERIFIED WITH: RN E MURPHY [phone] 1824 MLM    Culture (A)  Final    STAPHYLOCOCCUS SPECIES (COAGULASE NEGATIVE) THE SIGNIFICANCE OF ISOLATING THIS ORGANISM FROM A SINGLE SET OF BLOOD CULTURES WHEN MULTIPLE SETS ARE DRAWN IS UNCERTAIN. PLEASE NOTIFY THE MICROBIOLOGY DEPARTMENT WITHIN ONE WEEK IF  SPECIATION AND SENSITIVITIES ARE REQUIRED. Performed at [facility], [place] [address], [place], [place] [address]    Report Status 05/27/2018 FINAL  Final  Blood Culture ID Panel (Reflexed)     Status: Abnormal   Collection Time: 05/24/18  6:54 PM  Result Value Ref Range Status   Enterococcus species NOT DETECTED NOT DETECTED Final   Listeria monocytogenes NOT  DETECTED NOT DETECTED Final   Staphylococcus species DETECTED (A) NOT DETECTED Final    Comment: Methicillin (oxacillin) resistant coagulase negative staphylococcus. Possible blood culture contaminant (unless isolated from more than one blood culture draw or clinical case suggests pathogenicity). No antibiotic treatment is indicated for blood  culture contaminants. CRITICAL RESULT CALLED TO, READ BACK BY AND VERIFIED WITH: RN E MURPHY [phone] 1824 MLM    Staphylococcus aureus NOT DETECTED NOT DETECTED Final   Methicillin resistance DETECTED (A) NOT DETECTED Final    Comment: CRITICAL RESULT CALLED TO, READ BACK BY AND VERIFIED WITH: RN E MURPHY [phone] 1824 MLM    Streptococcus species NOT DETECTED NOT DETECTED Final   Streptococcus agalactiae NOT DETECTED NOT DETECTED Final   Streptococcus pneumoniae NOT DETECTED NOT DETECTED Final   Streptococcus pyogenes NOT DETECTED NOT DETECTED Final   Acinetobacter baumannii NOT DETECTED NOT DETECTED Final   Enterobacteriaceae species NOT DETECTED NOT DETECTED Final   Enterobacter cloacae complex NOT DETECTED NOT DETECTED Final   Escherichia coli NOT DETECTED NOT DETECTED Final   Klebsiella oxytoca NOT DETECTED NOT DETECTED Final   Klebsiella pneumoniae NOT DETECTED NOT DETECTED Final   Proteus species NOT DETECTED NOT DETECTED Final   Serratia marcescens NOT DETECTED NOT DETECTED Final   Haemophilus influenzae NOT DETECTED NOT DETECTED Final   Neisseria meningitidis NOT DETECTED NOT DETECTED Final   Pseudomonas aeruginosa NOT DETECTED NOT DETECTED Final   Candida albicans NOT  DETECTED NOT DETECTED Final   Candida glabrata NOT DETECTED NOT DETECTED Final   Candida krusei NOT DETECTED NOT DETECTED Final   Candida parapsilosis NOT DETECTED NOT DETECTED Final   Candida tropicalis NOT DETECTED NOT DETECTED Final    Comment: Performed at [facility], [address], [place], [place] [address]  Culture, blood (routine x 2)     Status: None (Preliminary result)   Collection Time: 05/24/18  7:03 PM  Result Value Ref Range Status   Specimen Description BLOOD LEFT HAND  Final   Special Requests   Final    BOTTLES DRAWN AEROBIC AND ANAEROBIC Blood Culture adequate volume   Culture   Final    NO GROWTH 4 DAYS Performed at [facility], [address], [place], [place] [address]    Report Status PENDING  Incomplete  MRSA PCR Screening     Status: None   Collection Time: 05/25/18  8:34 PM  Result Value Ref Range Status   MRSA by PCR NEGATIVE NEGATIVE Final    Comment:        The GeneXpert MRSA Assay (FDA approved for NASAL specimens only), is one component of a comprehensive MRSA colonization surveillance program. It is not intended to diagnose MRSA infection nor to guide or monitor treatment for MRSA infections. Performed at [facility], [address], [place], [place] [address]          Radiology Studies: Ct Head Wo Contrast  Result Date: 05/27/2018 CLINICAL DATA:  82 year old male with recent right basal ganglia lacunar infarct. Altered mental status, increasing lethargy and fever. EXAM: CT HEAD WITHOUT CONTRAST TECHNIQUE: Contiguous axial images were obtained from the base of the skull through the vertex without intravenous contrast. COMPARISON:  Brain MRI 05/24/2018.  Head CT 05/13/2018. FINDINGS: Brain: Progressed hypodensity in the right basal ganglia compatible with cytotoxic edema related to the recent infarct. This appears stable to the diffusion abnormality on 05/24/2018. No associated hemorrhage. Minimal associated mass effect on the adjacent  right lateral ventricle. No new cytotoxic edema  identified. Stable gray-white matter differentiation elsewhere. No intracranial hemorrhage or ventriculomegaly. 15 millimeter calcified right insula region meningioma again noted. Much smaller left anterior convexity meningioma is also stable. Vascular: Calcified atherosclerosis at the skull base. No suspicious intracranial vascular hyperdensity. Skull: No acute osseous abnormality identified. Sinuses/Orbits: Visualized paranasal sinuses and mastoids are stable and well pneumatized. Other: No acute orbit or scalp soft tissue finding. Partially visible endotracheal tube in the oral cavity. IMPRESSION: 1. Expected evolution of the recent right basal ganglia infarct with no associated hemorrhage and minimal mass effect. 2. No new acute intracranial abnormality identified. Electronically Signed   By: Genevie Ann M.D.   On: 05/27/2018 10:53   Dg Chest Port 1 View  Result Date: 05/27/2018 CLINICAL DATA:  Respiratory failure. EXAM: PORTABLE CHEST 1 VIEW COMPARISON:  Chest x-rays dated 05/26/2018 and 05/25/2018 FINDINGS: Endotracheal tube is 5 cm above the carina. Heart size and pulmonary vascularity are normal. There is a small patchy area of infiltrate at the left lung base. Lungs are otherwise clear. IMPRESSION: 1. Endotracheal tube in good position. 2. Small patchy infiltrate at the left lung base, improved since the study of 05/25/2018. Electronically Signed   By: Lorriane Shire M.D.   On: 05/27/2018 08:53        Scheduled Meds: . aspirin  300 mg Rectal Daily  . atorvastatin  80 mg Per Tube q1800  . chlorhexidine gluconate (MEDLINE KIT)  15 mL Mouth Rinse BID  . enoxaparin (LOVENOX) injection  80 mg Subcutaneous Q12H  . feeding supplement (PRO-STAT SUGAR FREE 64)  30 mL Per Tube Daily  . insulin aspart  0-15 Units Subcutaneous Q4H  . ipratropium-albuterol  3 mL Nebulization Q6H  . mouth rinse  15 mL Mouth Rinse 10 times per day  . metoprolol tartrate  5 mg  Intravenous Q6H  . sodium chloride flush  3 mL Intravenous Q12H   Continuous Infusions: . sodium chloride 10 mL/hr at 05/27/18 0500  . ampicillin-sulbactam (UNASYN) IV 3 g (05/28/18 0743)  . dextrose 5 % 1,000 mL with potassium chloride 40 mEq infusion 50 mL/hr at 05/28/18 0800  . famotidine (PEPCID) IV 20 mg (05/28/18 0935)  . feeding supplement (VITAL AF 1.2 CAL)       LOS: 5 days    Critical care time spent: 28mns.  Patient remains critically ill on the ventilator with continued low grade fevers. He has been unable to wean thus far from the ventilator. He needs close monitoring in the ICU    JKathie Dike MD [facility] Pager [phone] If 7PM-7AM, please contact night-coverage [email or web address] Password [facility]9/22/2019, 2:56 PM

## 2018-05-28 NOTE — Progress Notes (Signed)
Subjective: He remains intubated and on the ventilator.  According to his daughter at bedside he was responding to family with nods and blinks yesterday.  He is sedated this morning.  He had CT done yesterday that showed expected and of his findings on CT but no conversion to hemorrhagic stroke.  Respiratory tried him on CPAP today but he had poor effort.  Objective: Vital signs in last 24 hours: Temp:  [99.3 F (37.4 C)-100.6 F (38.1 C)] 100.4 F (38 C) (09/22 0800) Pulse Rate:  [75-131] 84 (09/22 0800) Resp:  [18-29] 18 (09/22 0800) BP: (114-159)/(65-97) 141/86 (09/22 0800) SpO2:  [93 %-100 %] 99 % (09/22 0850) FiO2 (%):  [28 %-35 %] 35 % (09/22 0850) Weight:  [81.6 kg] 81.6 kg (09/22 0430) Weight change: -3.4 kg Last BM Date: (unknown)  Intake/Output from previous day: 09/21 0701 - 09/22 0700 In: 911.3 [I.V.:611.3; IV Piggyback:300] Out: 7829 [Urine:1575]  PHYSICAL EXAM General appearance: Intubated sedated on mechanical ventilation Resp: rhonchi bilaterally Cardio: regular rate and rhythm, S1, S2 normal, no murmur, click, rub or gallop GI: soft, non-tender; bowel sounds normal; no masses,  no organomegaly Extremities: extremities normal, atraumatic, no cyanosis or edema Cardiac report is an error.  He remains in atrial fib with well-controlled ventricular response Lab Results:  Results for orders placed or performed during the hospital encounter of 06/05/2018 (from the past 48 hour(s))  Glucose, capillary     Status: Abnormal   Collection Time: 05/26/18 11:55 AM  Result Value Ref Range   Glucose-Capillary 152 (H) 70 - 99 mg/dL  Glucose, capillary     Status: Abnormal   Collection Time: 05/26/18  4:13 PM  Result Value Ref Range   Glucose-Capillary 141 (H) 70 - 99 mg/dL  Magnesium     Status: None   Collection Time: 05/26/18  5:00 PM  Result Value Ref Range   Magnesium 2.1 1.7 - 2.4 mg/dL    Comment: Performed at [facility], [address], [place], [place] [address]   Phosphorus     Status: Abnormal   Collection Time: 05/26/18  5:00 PM  Result Value Ref Range   Phosphorus 2.2 (L) 2.5 - 4.6 mg/dL    Comment: Performed at [facility], [address], [place], [place] [address]  Glucose, capillary     Status: Abnormal   Collection Time: 05/26/18  9:03 PM  Result Value Ref Range   Glucose-Capillary 151 (H) 70 - 99 mg/dL  Glucose, capillary     Status: Abnormal   Collection Time: 05/26/18 11:31 PM  Result Value Ref Range   Glucose-Capillary 152 (H) 70 - 99 mg/dL  CBC     Status: Abnormal   Collection Time: 05/27/18  3:50 AM  Result Value Ref Range   WBC 8.6 4.0 - 10.5 K/uL   RBC 3.72 (L) 4.22 - 5.81 MIL/uL   Hemoglobin 10.3 (L) 13.0 - 17.0 g/dL   HCT 34.3 (L) 39.0 - 52.0 %   MCV 92.2 78.0 - 100.0 fL   MCH 27.7 26.0 - 34.0 pg   MCHC 30.0 30.0 - 36.0 g/dL   RDW 16.7 (H) 11.5 - 15.5 %   Platelets 255 150 - 400 K/uL    Comment: Performed at [facility], [address] [place], [place] [address]  Magnesium     Status: None   Collection Time: 05/27/18  3:50 AM  Result Value Ref Range   Magnesium 2.1 1.7 - 2.4 mg/dL    Comment: Performed at [facility], [address], [place], [place] [address]  Phosphorus     Status: None   Collection Time: 05/27/18  3:50 AM  Result Value Ref Range   Phosphorus 2.5 2.5 - 4.6 mg/dL    Comment: Performed at [facility], [address] [place], [place] [address]  Comprehensive metabolic panel     Status: Abnormal   Collection Time: 05/27/18  3:50 AM  Result Value Ref Range   Sodium 146 (H) 135 - 145 mmol/L   Potassium 3.4 (L) 3.5 - 5.1 mmol/L   Chloride 114 (H) 98 - 111 mmol/L   CO2 25 22 - 32 mmol/L   Glucose, Bld 147 (H) 70 - 99 mg/dL   BUN 20 8 - 23 mg/dL   Creatinine, Ser 1.12 0.61 - 1.24 mg/dL   Calcium 8.8 (L) 8.9 - 10.3 mg/dL   Total Protein 6.3 (L) 6.5 - 8.1 g/dL   Albumin 3.1 (L) 3.5 - 5.0 g/dL   AST 18 15 - 41 U/L   ALT 13 0 - 44 U/L   Alkaline Phosphatase 45 38 - 126 U/L   Total Bilirubin 0.6  0.3 - 1.2 mg/dL   GFR calc non Af Amer >60 >60 mL/min   GFR calc Af Amer >60 >60 mL/min    Comment: (NOTE) The eGFR has been calculated using the CKD EPI equation. This calculation has not been validated in all clinical situations. eGFR's persistently <60 mL/min signify possible Chronic Kidney Disease.    Anion gap 7 5 - 15    Comment: Performed at [facility], [address], [place], [place] [address]  Glucose, capillary     Status: Abnormal   Collection Time: 05/27/18  3:55 AM  Result Value Ref Range   Glucose-Capillary 129 (H) 70 - 99 mg/dL  Blood gas, arterial     Status: Abnormal   Collection Time: 05/27/18  5:27 AM  Result Value Ref Range   FIO2 28.00    Delivery systems VENTILATOR    Mode PRESSURE REGULATED VOLUME CONTROL    VT 620 mL   LHR 18 resp/min   Peep/cpap 5.0 cm H20   pH, Arterial 7.458 (H) 7.350 - 7.450   pCO2 arterial 34.0 32.0 - 48.0 mmHg   pO2, Arterial 123 (H) 83.0 - 108.0 mmHg   Bicarbonate 25.0 20.0 - 28.0 mmol/L   Acid-Base Excess 0.3 0.0 - 2.0 mmol/L   O2 Saturation 98.7 %   Patient temperature 37.0    Collection site REVIEWED BY    Drawn by [phone]    Sample type ARTERIAL DRAW    Allens test (pass/fail) PASS PASS    Comment: Performed at [facility], [address] [place], [place] [address]  Glucose, capillary     Status: Abnormal   Collection Time: 05/27/18  7:30 AM  Result Value Ref Range   Glucose-Capillary 154 (H) 70 - 99 mg/dL  Glucose, capillary     Status: Abnormal   Collection Time: 05/27/18 12:21 PM  Result Value Ref Range   Glucose-Capillary 120 (H) 70 - 99 mg/dL  Glucose, capillary     Status: Abnormal   Collection Time: 05/27/18  4:07 PM  Result Value Ref Range   Glucose-Capillary 138 (H) 70 - 99 mg/dL  Magnesium     Status: None   Collection Time: 05/27/18  4:15 PM  Result Value Ref Range   Magnesium 2.0 1.7 - 2.4 mg/dL    Comment: Performed at [facility], [address], [place], [place] [address]  Phosphorus     Status:  None   Collection Time: 05/27/18  4:15 PM  Result Value Ref Range   Phosphorus 2.6 2.5 - 4.6 mg/dL    Comment: Performed at [facility], [address] [place], [place] [address]  Glucose, capillary     Status: Abnormal   Collection Time: 05/27/18  8:41 PM  Result Value Ref Range   Glucose-Capillary 129 (H) 70 - 99 mg/dL  Glucose, capillary     Status: Abnormal   Collection Time: 05/27/18 11:08 PM  Result Value Ref Range   Glucose-Capillary 117 (H) 70 - 99 mg/dL  Glucose, capillary     Status: Abnormal   Collection Time: 05/28/18  4:23 AM  Result Value Ref Range   Glucose-Capillary 133 (H) 70 - 99 mg/dL  Blood gas, arterial     Status: Abnormal   Collection Time: 05/28/18  5:00 AM  Result Value Ref Range   FIO2 35.00    Delivery systems VENTILATOR    Mode PRESSURE REGULATED VOLUME CONTROL    VT 620 mL   LHR 18 resp/min   Peep/cpap 5.0 cm H20   pH, Arterial 7.466 (H) 7.350 - 7.450   pCO2 arterial 34.6 32.0 - 48.0 mmHg   pO2, Arterial 96.9 83.0 - 108.0 mmHg   Bicarbonate 25.7 20.0 - 28.0 mmol/L   Acid-Base Excess 1.1 0.0 - 2.0 mmol/L   O2 Saturation 97.1 %   Patient temperature 38.1    Collection site LEFT RADIAL    Drawn by [id number]    Sample type ARTERIAL DRAW    Allens test (pass/fail) PASS PASS    Comment: Performed at [facility], [address] [place], [place] [address]  CBC     Status: Abnormal   Collection Time: 05/28/18  5:51 AM  Result Value Ref Range   WBC 5.9 4.0 - 10.5 K/uL   RBC 3.44 (L) 4.22 - 5.81 MIL/uL   Hemoglobin 9.5 (L) 13.0 - 17.0 g/dL   HCT 31.6 (L) 39.0 - 52.0 %   MCV 91.9 78.0 - 100.0 fL   MCH 27.6 26.0 - 34.0 pg   MCHC 30.1 30.0 - 36.0 g/dL   RDW 16.4 (H) 11.5 - 15.5 %   Platelets 252 150 - 400 K/uL    Comment: Performed at [facility], [address] [place], [place] [address]  Basic metabolic panel     Status: Abnormal   Collection Time: 05/28/18  5:51 AM  Result Value Ref Range   Sodium 149 (H) 135 - 145 mmol/L   Potassium 3.4 (L)  3.5 - 5.1 mmol/L   Chloride 117 (H) 98 - 111 mmol/L   CO2 24 22 - 32 mmol/L   Glucose, Bld 139 (H) 70 - 99 mg/dL   BUN 17 8 - 23 mg/dL   Creatinine, Ser 0.90 0.61 - 1.24 mg/dL   Calcium 9.0 8.9 - 10.3 mg/dL   GFR calc non Af Amer >60 >60 mL/min   GFR calc Af Amer >60 >60 mL/min    Comment: (NOTE) The eGFR has been calculated using the CKD EPI equation. This calculation has not been validated in all clinical situations. eGFR's persistently <60 mL/min signify possible Chronic Kidney Disease.    Anion gap 8 5 - 15    Comment: Performed at [facility], [address], [place], [place] [address]  Glucose, capillary     Status: Abnormal   Collection Time: 05/28/18  7:32 AM  Result Value Ref Range   Glucose-Capillary  135 (H) 70 - 99 mg/dL    ABGS Recent Labs    05/28/18 0500  PHART 7.466*  PO2ART 96.9  HCO3 25.7   CULTURES Recent Results (from the past 240 hour(s))  Culture, blood (routine x 2)     Status: Abnormal   Collection Time: 05/24/18  6:54 PM  Result Value Ref Range Status   Specimen Description   Final    BLOOD RIGHT HAND Performed at [facility], [address], [place], [place] [address]    Special Requests   Final    BOTTLES DRAWN AEROBIC AND ANAEROBIC Blood Culture adequate volume Performed at [facility], [address] [place], [place] [address]    Culture  Setup Time   Final    GRAM POSITIVE COCCI ANAEROBIC BOTTLE GRAM STAINED RESULT CALLED TO/READ BACK FROM MISTY SMART AT 1300  BY HFLYNT 05/25/18 CRITICAL RESULT CALLED TO, READ BACK BY AND VERIFIED WITH: RN E MURPHY [phone] 1824 MLM    Culture (A)  Final    STAPHYLOCOCCUS SPECIES (COAGULASE NEGATIVE) THE SIGNIFICANCE OF ISOLATING THIS ORGANISM FROM A SINGLE SET OF BLOOD CULTURES WHEN MULTIPLE SETS ARE DRAWN IS UNCERTAIN. PLEASE NOTIFY THE MICROBIOLOGY DEPARTMENT WITHIN ONE WEEK IF SPECIATION AND SENSITIVITIES ARE REQUIRED. Performed at [facility], [place] [address], [place], [place] [address]     Report Status 05/27/2018 FINAL  Final  Blood Culture ID Panel (Reflexed)     Status: Abnormal   Collection Time: 05/24/18  6:54 PM  Result Value Ref Range Status   Enterococcus species NOT DETECTED NOT DETECTED Final   Listeria monocytogenes NOT DETECTED NOT DETECTED Final   Staphylococcus species DETECTED (A) NOT DETECTED Final    Comment: Methicillin (oxacillin) resistant coagulase negative staphylococcus. Possible blood culture contaminant (unless isolated from more than one blood culture draw or clinical case suggests pathogenicity). No antibiotic treatment is indicated for blood  culture contaminants. CRITICAL RESULT CALLED TO, READ BACK BY AND VERIFIED WITH: RN E MURPHY [phone] 1824 MLM    Staphylococcus aureus NOT DETECTED NOT DETECTED Final   Methicillin resistance DETECTED (A) NOT DETECTED Final    Comment: CRITICAL RESULT CALLED TO, READ BACK BY AND VERIFIED WITH: RN E MURPHY [phone] 1824 MLM    Streptococcus species NOT DETECTED NOT DETECTED Final   Streptococcus agalactiae NOT DETECTED NOT DETECTED Final   Streptococcus pneumoniae NOT DETECTED NOT DETECTED Final   Streptococcus pyogenes NOT DETECTED NOT DETECTED Final   Acinetobacter baumannii NOT DETECTED NOT DETECTED Final   Enterobacteriaceae species NOT DETECTED NOT DETECTED Final   Enterobacter cloacae complex NOT DETECTED NOT DETECTED Final   Escherichia coli NOT DETECTED NOT DETECTED Final   Klebsiella oxytoca NOT DETECTED NOT DETECTED Final   Klebsiella pneumoniae NOT DETECTED NOT DETECTED Final   Proteus species NOT DETECTED NOT DETECTED Final   Serratia marcescens NOT DETECTED NOT DETECTED Final   Haemophilus influenzae NOT DETECTED NOT DETECTED Final   Neisseria meningitidis NOT DETECTED NOT DETECTED Final   Pseudomonas aeruginosa NOT DETECTED NOT DETECTED Final   Candida albicans NOT DETECTED NOT DETECTED Final   Candida glabrata NOT DETECTED NOT DETECTED Final   Candida krusei NOT DETECTED NOT DETECTED Final    Candida parapsilosis NOT DETECTED NOT DETECTED Final   Candida tropicalis NOT DETECTED NOT DETECTED Final    Comment: Performed at [facility], [address], [place], [place] [address]  Culture, blood (routine x 2)     Status: None (Preliminary result)   Collection  Time: 05/24/18  7:03 PM  Result Value Ref Range Status   Specimen Description BLOOD LEFT HAND  Final   Special Requests   Final    BOTTLES DRAWN AEROBIC AND ANAEROBIC Blood Culture adequate volume   Culture   Final    NO GROWTH 4 DAYS Performed at [facility], [address] [place], [place] [address]    Report Status PENDING  Incomplete  MRSA PCR Screening     Status: None   Collection Time: 05/25/18  8:34 PM  Result Value Ref Range Status   MRSA by PCR NEGATIVE NEGATIVE Final    Comment:        The GeneXpert MRSA Assay (FDA approved for NASAL specimens only), is one component of a comprehensive MRSA colonization surveillance program. It is not intended to diagnose MRSA infection nor to guide or monitor treatment for MRSA infections. Performed at [facility], [address] [place], [place] [address]    Studies/Results: Dg Abd 1 View  Result Date: 05/26/2018 CLINICAL DATA:  OG tube placement. EXAM: ABDOMEN - 1 VIEW COMPARISON:  CT scan 09/01/2010 FINDINGS: Supine view the abdomen shows a nonspecific bowel gas pattern. On the CT scan from 8 years ago, the patient had a large hiatal hernia. Recent chest x-rays suggest persistence of the hiatal hernia. A similar the presence of a hiatal hernia, the OG tube is likely folded on itself within the hiatal hernia in the tube appears kinked at the level of the proximal port. IVC filter visualized in situ IMPRESSION: Assuming moderate to large hiatal hernia as noted on previous CT of 09/01/2010, the OG tube is probably folded on itself within the hernia and kinked at the level of the proximal port. Electronically Signed   By: Misty Stanley M.D.   On: 05/26/2018 13:43    Ct Head Wo Contrast  Result Date: 05/27/2018 CLINICAL DATA:  82 year old male with recent right basal ganglia lacunar infarct. Altered mental status, increasing lethargy and fever. EXAM: CT HEAD WITHOUT CONTRAST TECHNIQUE: Contiguous axial images were obtained from the base of the skull through the vertex without intravenous contrast. COMPARISON:  Brain MRI 05/24/2018.  Head CT 06/01/2018. FINDINGS: Brain: Progressed hypodensity in the right basal ganglia compatible with cytotoxic edema related to the recent infarct. This appears stable to the diffusion abnormality on 05/24/2018. No associated hemorrhage. Minimal associated mass effect on the adjacent right lateral ventricle. No new cytotoxic edema identified. Stable gray-white matter differentiation elsewhere. No intracranial hemorrhage or ventriculomegaly. 15 millimeter calcified right insula region meningioma again noted. Much smaller left anterior convexity meningioma is also stable. Vascular: Calcified atherosclerosis at the skull base. No suspicious intracranial vascular hyperdensity. Skull: No acute osseous abnormality identified. Sinuses/Orbits: Visualized paranasal sinuses and mastoids are stable and well pneumatized. Other: No acute orbit or scalp soft tissue finding. Partially visible endotracheal tube in the oral cavity. IMPRESSION: 1. Expected evolution of the recent right basal ganglia infarct with no associated hemorrhage and minimal mass effect. 2. No new acute intracranial abnormality identified. Electronically Signed   By: Genevie Ann M.D.   On: 05/27/2018 10:53   Dg Chest Port 1 View  Result Date: 05/27/2018 CLINICAL DATA:  Respiratory failure. EXAM: PORTABLE CHEST 1 VIEW COMPARISON:  Chest x-rays dated 05/26/2018 and 05/25/2018 FINDINGS: Endotracheal tube is 5 cm above the carina. Heart size and pulmonary vascularity are normal. There is a small patchy area of infiltrate at the left lung base. Lungs are otherwise clear. IMPRESSION: 1.  Endotracheal tube in good  position. 2. Small patchy infiltrate at the left lung base, improved since the study of 05/25/2018. Electronically Signed   By: Lorriane Shire M.D.   On: 05/27/2018 08:53   Dg Chest Port 1v Same Day  Result Date: 05/26/2018 CLINICAL DATA:  OG tube placement EXAM: PORTABLE CHEST 1 VIEW COMPARISON:  None. FINDINGS: Endotracheal tube with the tip 5.2 cm above the carina. Nasogastric tube visualized to the level of the diaphragm, but not seen below the diaphragm secondary to technique. Recommend a KUB for re-evaluation. No focal consolidation, pleural effusion or pneumothorax. Stable cardiomediastinal silhouette. Severe osteoarthritis of the right glenohumeral joint. No acute osseous abnormality. IMPRESSION: 1. Endotracheal tube with the tip 5.2 cm above the carina. 2. Nasogastric tube visualized to the level of the diaphragm, but not seen below the diaphragm secondary to technique. Recommend a KUB for re-evaluation. Electronically Signed   By: Kathreen Devoid   On: 05/26/2018 11:12    Medications:  Prior to Admission:  Medications Prior to Admission  Medication Sig Dispense Refill Last Dose  . Acetaminophen 500 MG coapsule Take 500 mg by mouth as needed for pain.    unknown  . amLODipine (NORVASC) 5 MG tablet TAKE 1 TABLET BY MOUTH DAILY. (Patient taking differently: Take 5 mg by mouth daily. ) 90 tablet 1 06/01/2018 at Unknown time  . atorvastatin (LIPITOR) 40 MG tablet Take 1 tablet (40 mg total) by mouth daily. 90 tablet 1 05/18/2018 at Unknown time  . benazepril (LOTENSIN) 40 MG tablet Take 1 tablet (40 mg total) by mouth 2 (two) times daily. 180 tablet 1 05/08/2018 at Unknown time  . betamethasone dipropionate 0.05 % lotion Apply topically 2 (two) times daily. (Patient taking differently: Apply 1 application topically 2 (two) times daily as needed (for irritation). ) 120 mL 0 unknown  . colchicine 0.6 MG tablet Take 1 tablet (0.6 mg total) by mouth daily. Continue daily until  you have had no pain in your foot for 2 days.  Then stop. (Patient taking differently: Take 0.6 mg by mouth daily as needed (for gout pain as directed). Continue daily until you have had no pain in your foot for 2 days.  Then stop.) 20 tablet 0 unknown  . fenofibrate 160 MG tablet TAKE 1 TABLET EVERY DAY (Patient taking differently: Take 160 mg by mouth daily. ) 90 tablet 0 05/10/2018 at Unknown time  . furosemide (LASIX) 20 MG tablet TAKE 1 TABLET TWICE DAILY (Patient taking differently: Take 20 mg by mouth 2 (two) times daily. TAKE 1 TABLET TWICE DAILY) 180 tablet 1 05/16/2018 at Unknown time  . gabapentin (NEURONTIN) 400 MG capsule TAKE 2 CAPSULES BY MOUTH 2 (TWO) TIMES DAILY. (Patient taking differently: Take 800 mg by mouth 2 (two) times daily. ) 360 capsule 0 06/01/2018 at Unknown time  . metFORMIN (GLUCOPHAGE) 500 MG tablet Take 500 mg by mouth 2 (two) times daily with a meal.    05/17/2018 at Unknown time  . potassium chloride SA (K-DUR,KLOR-CON) 20 MEQ tablet TAKE 2 TABLETS TWICE DAILY (Patient taking differently: Take 40 mEq by mouth 2 (two) times daily. ) 360 tablet 1 05/17/2018 at Unknown time  . rivaroxaban (XARELTO) 20 MG TABS tablet Take 20 mg by mouth daily with supper.    05/14/2018 at Unknown time   Scheduled: . aspirin  300 mg Rectal Daily  . atorvastatin  80 mg Per Tube q1800  . chlorhexidine gluconate (MEDLINE KIT)  15 mL Mouth Rinse BID  . enoxaparin (LOVENOX) injection  80 mg Subcutaneous Q12H  . feeding supplement (PRO-STAT SUGAR FREE 64)  30 mL Per Tube Daily  . insulin aspart  0-15 Units Subcutaneous Q4H  . ipratropium-albuterol  3 mL Nebulization Q6H  . mouth rinse  15 mL Mouth Rinse 10 times per day  . metoprolol tartrate  5 mg Intravenous Q6H  . sodium chloride flush  3 mL Intravenous Q12H   Continuous: . sodium chloride 10 mL/hr at 05/27/18 0500  . ampicillin-sulbactam (UNASYN) IV 3 g (05/28/18 0743)  . dextrose 5 % 1,000 mL with potassium chloride 40 mEq infusion 50  mL/hr at 05/28/18 0028  . famotidine (PEPCID) IV Stopped (05/27/18 1245)  . feeding supplement (VITAL AF 1.2 CAL)     YSA:YTKZSW chloride, acetaminophen **OR** acetaminophen, albuterol, docusate, fentaNYL (SUBLIMAZE) injection, fentaNYL (SUBLIMAZE) injection, hydrALAZINE, midazolam, midazolam, ondansetron **OR** ondansetron (ZOFRAN) IV, polyvinyl alcohol, sodium chloride flush  Assesment: He was admitted with a CVA.  He initially did relatively well but developed increasing problems with his respiratory status which eventually culminated in him being intubated and placed on mechanical ventilation.  He remains on the ventilator.  We had to go up to 35% yesterday.  Respiratory tried to see what he would do on weaning today and he did not make much respiratory effort but he has received sedation.  He was responsive to family yesterday by their report.  He has chronic atrial fib and he is chronically anticoagulated  He has diabetes mellitus with neuropathy and he is on sliding scale.  He has aspiration pneumonia he is on appropriate treatment and chest x-ray yesterday showed some improvement.  He has chronic diastolic heart failure which seems relatively stable but he has had trouble with some edema  He remains critically ill with multiple medical problems and multiple system problems. Principal Problem:   CVA (cerebral vascular accident) [facility]) Active Problems:   Essential hypertension   Peripheral neuropathy   Hyperlipidemia   Hypokalemia   Diabetes mellitus with neuropathy (HCC)   Paroxysmal atrial fibrillation (HCC)   AKI (acute kidney injury) ([place])   Chronic diastolic congestive heart failure (HCC)   Chronic anticoagulation   Iron deficiency anemia due to chronic blood loss   Acute respiratory failure (HCC)   Aspiration pneumonia of both lower lobes due to gastric secretions (HCC)   Malnutrition of moderate degree   Pressure injury of skin    Plan: No weaning today.  See what he  can do tomorrow.  Try to keep him off of sedation after at least early morning so we can see what sort of effort he has.    LOS: 5 days   Micheale Schlack L 05/28/2018, 9:15 AM

## 2018-05-28 NOTE — Progress Notes (Signed)
At approximately 0845 the patient was tried on CPAP 5/PS 15 and 35%. He had poor respiratory effort and the  ventilator's backup rate was initiated. Keith Doyle was returned to PRVC18/ 620/ 35%/ PEEP 5.

## 2018-05-29 ENCOUNTER — Inpatient Hospital Stay (HOSPITAL_COMMUNITY)
Admit: 2018-05-29 | Discharge: 2018-05-29 | Disposition: A | Discharge status: Home / Self Care | Payer: Medicare HMO | Attending: Internal Medicine | Admitting: Internal Medicine

## 2018-05-29 ENCOUNTER — Other Ambulatory Visit: Payer: Self-pay

## 2018-05-29 ENCOUNTER — Encounter (HOSPITAL_COMMUNITY): Payer: Self-pay | Admitting: Primary Care

## 2018-05-29 ENCOUNTER — Inpatient Hospital Stay (HOSPITAL_COMMUNITY): Payer: Medicare HMO

## 2018-05-29 DIAGNOSIS — Z515 Encounter for palliative care: Secondary | ICD-10-CM

## 2018-05-29 DIAGNOSIS — Z7189 Other specified counseling: Secondary | ICD-10-CM

## 2018-05-29 DIAGNOSIS — R569 Unspecified convulsions: Secondary | ICD-10-CM | POA: Diagnosis not present

## 2018-05-29 LAB — BLOOD GAS, ARTERIAL
Acid-Base Excess: 0.4 mmol/L (ref 0.0–2.0)
Bicarbonate: 25 mmol/L (ref 20.0–28.0)
Drawn by: 382351
FIO2: 35
MECHVT: 620 mL
O2 Saturation: 97.9 %
PCO2 ART: 34.2 mmHg (ref 32.0–48.0)
PEEP/CPAP: 5 cmH2O
PH ART: 7.457 — AB (ref 7.350–7.450)
Patient temperature: 37
RATE: 18 resp/min
pO2, Arterial: 106 mmHg (ref 83.0–108.0)

## 2018-05-29 LAB — CULTURE, BLOOD (ROUTINE X 2)
CULTURE: NO GROWTH
SPECIAL REQUESTS: ADEQUATE

## 2018-05-29 LAB — BASIC METABOLIC PANEL
Anion gap: 9 (ref 5–15)
BUN: 15 mg/dL (ref 8–23)
CALCIUM: 9.1 mg/dL (ref 8.9–10.3)
CO2: 23 mmol/L (ref 22–32)
CREATININE: 0.9 mg/dL (ref 0.61–1.24)
Chloride: 117 mmol/L — ABNORMAL HIGH (ref 98–111)
GFR calc Af Amer: >60 mL/min (ref >60)
Glucose, Bld: 162 mg/dL — ABNORMAL HIGH (ref 70–99)
Potassium: 3.5 mmol/L (ref 3.5–5.1)
SODIUM: 149 mmol/L — AB (ref 135–145)

## 2018-05-29 LAB — GLUCOSE, CAPILLARY
GLUCOSE-CAPILLARY: 128 mg/dL — AB (ref 70–99)
GLUCOSE-CAPILLARY: 132 mg/dL — AB (ref 70–99)
GLUCOSE-CAPILLARY: 134 mg/dL — AB (ref 70–99)
Glucose-Capillary: 140 mg/dL — ABNORMAL HIGH (ref 70–99)
Glucose-Capillary: 140 mg/dL — ABNORMAL HIGH (ref 70–99)
Glucose-Capillary: 135 mg/dL — ABNORMAL HIGH (ref 70–99)

## 2018-05-29 LAB — CBC
HCT: 34.8 % — ABNORMAL LOW (ref 39.0–52.0)
Hemoglobin: 10.4 g/dL — ABNORMAL LOW (ref 13.0–17.0)
MCH: 27.7 pg (ref 26.0–34.0)
MCHC: 29.9 g/dL — AB (ref 30.0–36.0)
MCV: 92.8 fL (ref 78.0–100.0)
PLATELETS: 235 10*3/uL (ref 150–400)
RBC: 3.75 MIL/uL — ABNORMAL LOW (ref 4.22–5.81)
RDW: 16.6 % — AB (ref 11.5–15.5)
WBC: 8.5 10*3/uL (ref 4.0–10.5)

## 2018-05-29 MED ORDER — PIPERACILLIN-TAZOBACTAM 3.375 G IVPB
3.3750 g | Freq: Three times a day (TID) | INTRAVENOUS | Status: DC
  Administered 2018-05-29 – 2018-06-04 (×18): 3.375 g via INTRAVENOUS
  Filled 2018-05-29 (×15): qty 50

## 2018-05-29 NOTE — Progress Notes (Addendum)
PHARMACY - ADULT TOTAL PARENTERAL NUTRITION CONSULT NOTE   Pharmacy Consult for  TPN Indication:  Moderate malnutrition related to acute illness  Patient Measurements: Height: 6' (182.9 cm) Weight: 179 lb 14.3 oz (81.6 kg) IBW/kg (Calculated) : 77.6 TPN AdjBW (KG): 80.5 Body mass index is 24.4 kg/m. Usual Weight:    Assessment:  82 yo male with aspiration pneumonia  has  moderate malnutrition  and is intolerant of enteral feeds. PICC to be placed on 05/30/18 by PICC Team.   GI: hiatal hernia, multiple gastric ulcers Endo: on sliding scale insulin  q4h while NPO- no scheduled insulin dose (on metformin PTA) Insulin requirements in the past 24 hours:  6 units Lytes: Na 149  K 3.5  Cl 117    (on D5 w/51meqKCl/liter at 137ml/hr) Renal:  Scr 0.9mg /dL Pulm: intubated on vent Cards: had CVA on 05/28/2018 Hepatobil: alb 3.1   AST/ALT: 18/13   Neuro: sedated on vent ID: on Zosyn for aspiration PNA  TPN Access: PICC line to be placed on 05/30/18 by PICC team from [place] start date:  05-30-18 Nutritional Goals (per RD recommendation on 05/26/18): KCal: 1820 Protein: 117-133 g/day Fluid: per MD goals  Goal TPN rate:   110 ml/hr (provides  265mL  of fluid, 132g protein and 1874 Kcal) (based on above RD recommendations)  Current Nutrition:  Vital AF 1.2 cal liquid at 99mL/hr   Plan:  Initiate TPN at  30mL/hr  Hold 20% lipid emulsion for first 7 days for ICU patients per ASPEN guidelines (Start date 06-05-18) This TPN provides 66 g of protein, 140 g of dextrose, and 0 g of lipids which provides 937 kCals per day, meeting 51% of patient needs Electrolytes in TPN:  Na, Ca, K, Mg, phos Add MVI, trace elements,  regular insulin 10 units/day,  folic acid 1mg /day  to TPN q4h SSI and adjust as needed Adjust  MIVF  D5 w/1meqKCl/liter to 50 ml/hr  Monitor TPN labs F/U daily   Despina Pole, Pharm. D. Clinical Pharmacist 05/29/2018 3:47 PM

## 2018-05-29 NOTE — Progress Notes (Addendum)
Milton A. Merlene Laughter, MD     [email or web address]           HISTORY: The patient 82 year old who presents with confusion and altered mental status.  The studies been done to evaluate for seizures as etiology.  MEDICATIONS: Scheduled Meds: . aspirin  300 mg Rectal Daily  . atorvastatin  80 mg Per Tube q1800  . chlorhexidine gluconate (MEDLINE KIT)  15 mL Mouth Rinse BID  . enoxaparin (LOVENOX) injection  80 mg Subcutaneous Q12H  . feeding supplement (PRO-STAT SUGAR FREE 64)  30 mL Per Tube Daily  . insulin aspart  0-15 Units Subcutaneous Q4H  . ipratropium-albuterol  3 mL Nebulization Q6H  . mouth rinse  15 mL Mouth Rinse 10 times per day  . metoprolol tartrate  5 mg Intravenous Q6H  . sodium chloride flush  3 mL Intravenous Q12H   Continuous Infusions: . sodium chloride 10 mL/hr at 05/27/18 0500  . dextrose 5 % 1,000 mL with potassium chloride 40 mEq infusion 100 mL/hr at 05/29/18 1600  . famotidine (PEPCID) IV 20 mg (05/29/18 0922)  . feeding supplement (VITAL AF 1.2 CAL)    . piperacillin-tazobactam (ZOSYN)  IV 3.375 g (05/29/18 2010)   PRN Meds:.sodium chloride, acetaminophen **OR** acetaminophen, albuterol, docusate, fentaNYL (SUBLIMAZE) injection, fentaNYL (SUBLIMAZE) injection, hydrALAZINE, midazolam, midazolam, ondansetron **OR** ondansetron (ZOFRAN) IV, polyvinyl alcohol, sodium chloride flush  Prior to Admission medications   Medication Sig Start Date End Date Taking? Authorizing Provider  Acetaminophen 500 MG coapsule Take 500 mg by mouth as needed for pain.  03/18/16  Yes [provider]  amLODipine (NORVASC) 5 MG tablet TAKE 1 TABLET BY MOUTH DAILY. Patient taking differently: Take 5 mg by mouth daily.  12/12/17  Yes Hawks, Christy A, FNP  atorvastatin (LIPITOR) 40 MG tablet Take 1 tablet (40 mg total) by mouth daily. 11/30/17  Yes Timmothy Euler, MD  benazepril (LOTENSIN) 40 MG tablet Take 1 tablet (40 mg total) by mouth 2 (two) times  daily. 05/01/18  Yes Dettinger, Fransisca Kaufmann, MD  betamethasone dipropionate 0.05 % lotion Apply topically 2 (two) times daily. Patient taking differently: Apply 1 application topically 2 (two) times daily as needed (for irritation).  10/31/17  Yes Hawks, Christy A, FNP  colchicine 0.6 MG tablet Take 1 tablet (0.6 mg total) by mouth daily. Continue daily until you have had no pain in your foot for 2 days.  Then stop. Patient taking differently: Take 0.6 mg by mouth daily as needed (for gout pain as directed). Continue daily until you have had no pain in your foot for 2 days.  Then stop. 12/07/17  Yes Gottschalk, Ashly M, DO  fenofibrate 160 MG tablet TAKE 1 TABLET EVERY DAY Patient taking differently: Take 160 mg by mouth daily.  03/21/18  Yes Timmothy Euler, MD  furosemide (LASIX) 20 MG tablet TAKE 1 TABLET TWICE DAILY Patient taking differently: Take 20 mg by mouth 2 (two) times daily. TAKE 1 TABLET TWICE DAILY 03/21/18  Yes Timmothy Euler, MD  gabapentin (NEURONTIN) 400 MG capsule TAKE 2 CAPSULES BY MOUTH 2 (TWO) TIMES DAILY. Patient taking differently: Take 800 mg by mouth 2 (two) times daily.  04/10/18  Yes Terald Sleeper, PA-C  metFORMIN (GLUCOPHAGE) 500 MG tablet Take 500 mg by mouth 2 (two) times daily with a meal.    Yes [provider]  potassium chloride SA (K-DUR,KLOR-CON) 20 MEQ tablet TAKE 2 TABLETS TWICE DAILY Patient taking differently: Take 40 mEq by mouth  2 (two) times daily.  01/17/18  Yes Timmothy Euler, MD  rivaroxaban (XARELTO) 20 MG TABS tablet Take 20 mg by mouth daily with supper.    Yes [provider]      ANALYSIS: A 16 channel recording using standard 10 20 measurements is conducted for 21 minutes.  The background activity gets as high as 6-1/2 Hz.  There is infrequent frontocentral 4 Hz slowing observed.  There is beta activity observed in the frontal areas.  Awake and drowsy activities are observed.  Photic stimulation and hyperventilation are not  conducted.  There is infrequent/occasional burst suppression pattern of up to 3 minutes.  Focal or lateralized slowing is not observed.  There is no epileptiform activity is observed.    IMPRESSION: 1.  This is recording shows moderate global slowing indicating moderate to global encephalopathy. 2.  There is also infrequent burst suppression pattern typically seen with potent sedatives such as propofol or benzodiazepines.      Patra Gherardi A. Merlene Laughter, M.D.  Diplomate, Tax adviser of Psychiatry and Neurology ( Neurology).

## 2018-05-29 NOTE — Progress Notes (Signed)
Began weaning on CP/PSV at this time. PS above peep 5 and peep of 5. The patient is pulling great VTs at this time and HR/BP are stable. Patient is showing no sign of distress or discomfort. We will continue the wean as tolerated.

## 2018-05-29 NOTE — Care Management Important Message (Signed)
Important Message  Patient Details  Name: Keith Doyle MRN: [id number] Date of Birth: 12-14-1935   Medicare Important Message Given:  Yes    [name] 05/29/2018, 11:29 AM

## 2018-05-29 NOTE — Progress Notes (Signed)
Pharmacy Antibiotic Note  Keith Doyle is a 82 y.o. male admitted on 06/01/2018 with possible aspiration pneumonia.  Pharmacy has been consulted for Zosyn dosing.  Plan: Start Zosyn 3.375g IV q8h (4-hr inf) Pharmacy will monitor renal function, cultures and patient progress.  Height: 6' (182.9 cm) Weight: 179 lb 14.3 oz (81.6 kg) IBW/kg (Calculated) : 77.6  Temp (24hrs), Avg:100.1 F (37.8 C), Min:99.3 F (37.4 C), Max:101.5 F (38.6 C)  Recent Labs  Lab 05/24/18 1854 05/24/18 2211 05/25/18 0440 05/26/18 0358 05/27/18 0350 05/28/18 0551 05/29/18 0401  WBC  --   --  11.1* 12.2* 8.6 5.9 8.5  CREATININE  --   --  1.75* 1.78* 1.12 0.90 0.90  LATICACIDVEN 2.1* 1.5  --   --   --   --   --     Estimated Creatinine Clearance: 71.9 mL/min (by C-G formula based on SCr of 0.9 mg/dL).    No Known Allergies  Antimicrobials this admission: Zosyn 9/23 >>      Microbiology results: 9/18  BCx2:CoNS (MRSA detected) 9/19  MRSA PCR: neg  Thank you for allowing pharmacy to be a part of this patient's care.  Despina Pole 05/29/2018 11:28 AM

## 2018-05-29 NOTE — Progress Notes (Signed)
Placed patient back in Full Support at this time. Pt was in no distress at this time. No issues weaning throughout the shift. RT will continue to wean in the morning after speaking with the MD about future goals.

## 2018-05-29 NOTE — Progress Notes (Signed)
Called by RN to assess patient. The ventilator had been alarming. The patients respiratory rate was alarming. He was breathing 30-31 bpm. He was in no hemodynamic instability at this point. I increased the rate on the alarm. Patient is stable and comfortable. RR was from 26-31 now. Pt will remain on wean for now. I will communicate this with RN.

## 2018-05-29 NOTE — Patient Outreach (Signed)
[facility]) Care Management  05/29/2018  Keith Doyle April 29, 1936 [id number]   TELEPHONE SCREENING Referral date: 05/19/18 Referral source: primary MD  Referral reason: medication management, diabetes, heart failure management/ education  Insurance: [employer]   Upon chart review patient was admitted to the hospital.  [facility] notified hospital liaison's Natividad Brood, RN and Marthenia Rolling, RN  PLAN: [facility] will await follow up from hospital liaison regarding patients discharge disposition.   Quinn Plowman RN,BSN,CCM [facility] Telephonic  [phone]

## 2018-05-29 NOTE — Progress Notes (Signed)
EEG completed; results pending.    

## 2018-05-29 NOTE — Progress Notes (Signed)
ANTICOAGULATION CONSULT NOTE - Follow Up Consult  Pharmacy Consult for  Lovenox dosing Indication: atrial fibrillation  No Known Allergies  Patient Measurements: Height: 6' (182.9 cm) Weight: 179 lb 14.3 oz (81.6 kg) IBW/kg (Calculated) : 77.6 Heparin Dosing Weight:    Vital Signs: Temp: 99.9 F (37.7 C) (09/23 0726) BP: 143/91 (09/23 0430) Pulse Rate: 97 (09/23 0726)  Labs: Recent Labs    05/27/18 0350 05/28/18 0551 05/29/18 0401  HGB 10.3* 9.5* 10.4*  HCT 34.3* 31.6* 34.8*  PLT 255 252 235  CREATININE 1.12 0.90 0.90    Estimated Creatinine Clearance: 71.9 mL/min (by C-G formula based on SCr of 0.9 mg/dL).   Assessment:  Patient remains intubated and on vent.  CBC and renal function are stable at this time.  Goal of Therapy:  Monitor platelets by anticoagulation protocol: Yes   Plan:  continue Lovenox 80mg  bid  Transition back to Xarelto when patient is off vent   Despina Pole, Pharm. D. Clinical Pharmacist 05/29/2018 9:21 AM
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
## 2018-05-29 NOTE — Progress Notes (Signed)
Subjective: He remains intubated and on the ventilator.  He did not have as good a period of responsiveness yesterday but according to his son at bedside he did open his eyes but did not attempt to communicate.  Objective: Vital signs in last 24 hours: Temp:  [99.3 F (37.4 C)-101.5 F (38.6 C)] 99.9 F (37.7 C) (09/23 0726) Pulse Rate:  [33-104] 97 (09/23 0726) Resp:  [18-24] 20 (09/23 0726) BP: (113-178)/(66-99) 143/91 (09/23 0430) SpO2:  [87 %-100 %] 100 % (09/23 0726) FiO2 (%):  [35 %] 35 % (09/23 0253) Weight:  [81.6 kg] 81.6 kg (09/23 0500) Weight change: 0 kg Last BM Date: (unknown)  Intake/Output from previous day: 09/22 0701 - 09/23 0700 In: 1120.2 [I.V.:670.2; IV Piggyback:450] Out: 8937 [Urine:1675]  PHYSICAL EXAM General appearance: Intubated sedated on mechanical ventilation Resp: rhonchi bilaterally Cardio: irregularly irregular rhythm GI: soft, non-tender; bowel sounds normal; no masses,  no organomegaly Extremities: extremities normal, atraumatic, no cyanosis or edema  Lab Results:  Results for orders placed or performed during the hospital encounter of 05/14/2018 (from the past 48 hour(s))  Glucose, capillary     Status: Abnormal   Collection Time: 05/27/18 12:21 PM  Result Value Ref Range   Glucose-Capillary 120 (H) 70 - 99 mg/dL  Glucose, capillary     Status: Abnormal   Collection Time: 05/27/18  4:07 PM  Result Value Ref Range   Glucose-Capillary 138 (H) 70 - 99 mg/dL  Magnesium     Status: None   Collection Time: 05/27/18  4:15 PM  Result Value Ref Range   Magnesium 2.0 1.7 - 2.4 mg/dL    Comment: Performed at [facility], [address], [place], [place] [address]  Phosphorus     Status: None   Collection Time: 05/27/18  4:15 PM  Result Value Ref Range   Phosphorus 2.6 2.5 - 4.6 mg/dL    Comment: Performed at [facility], [address], [place], [place] [address]  Glucose, capillary     Status: Abnormal   Collection Time: 05/27/18  8:41 PM   Result Value Ref Range   Glucose-Capillary 129 (H) 70 - 99 mg/dL  Glucose, capillary     Status: Abnormal   Collection Time: 05/27/18 11:08 PM  Result Value Ref Range   Glucose-Capillary 117 (H) 70 - 99 mg/dL  Glucose, capillary     Status: Abnormal   Collection Time: 05/28/18  4:23 AM  Result Value Ref Range   Glucose-Capillary 133 (H) 70 - 99 mg/dL  Blood gas, arterial     Status: Abnormal   Collection Time: 05/28/18  5:00 AM  Result Value Ref Range   FIO2 35.00    Delivery systems VENTILATOR    Mode PRESSURE REGULATED VOLUME CONTROL    VT 620 mL   LHR 18 resp/min   Peep/cpap 5.0 cm H20   pH, Arterial 7.466 (H) 7.350 - 7.450   pCO2 arterial 34.6 32.0 - 48.0 mmHg   pO2, Arterial 96.9 83.0 - 108.0 mmHg   Bicarbonate 25.7 20.0 - 28.0 mmol/L   Acid-Base Excess 1.1 0.0 - 2.0 mmol/L   O2 Saturation 97.1 %   Patient temperature 38.1    Collection site LEFT RADIAL    Drawn by [id number]    Sample type ARTERIAL DRAW    Allens test (pass/fail) PASS PASS    Comment: Performed at [facility], [address] [place], [place] [address]  CBC     Status: Abnormal   Collection Time: 05/28/18  5:51 AM  Result Value Ref Range   WBC 5.9 4.0 - 10.5 K/uL   RBC 3.44 (L) 4.22 - 5.81 MIL/uL   Hemoglobin 9.5 (L) 13.0 - 17.0 g/dL   HCT 31.6 (L) 39.0 - 52.0 %   MCV 91.9 78.0 - 100.0 fL   MCH 27.6 26.0 - 34.0 pg   MCHC 30.1 30.0 - 36.0 g/dL   RDW 16.4 (H) 11.5 - 15.5 %   Platelets 252 150 - 400 K/uL    Comment: Performed at [facility], [address] [place], [place] [address]  Basic metabolic panel     Status: Abnormal   Collection Time: 05/28/18  5:51 AM  Result Value Ref Range   Sodium 149 (H) 135 - 145 mmol/L   Potassium 3.4 (L) 3.5 - 5.1 mmol/L   Chloride 117 (H) 98 - 111 mmol/L   CO2 24 22 - 32 mmol/L   Glucose, Bld 139 (H) 70 - 99 mg/dL   BUN 17 8 - 23 mg/dL   Creatinine, Ser 0.90 0.61 - 1.24 mg/dL   Calcium 9.0 8.9 - 10.3 mg/dL   GFR calc non Af Amer >60 >60 mL/min   GFR calc Af  Amer >60 >60 mL/min    Comment: (NOTE) The eGFR has been calculated using the CKD EPI equation. This calculation has not been validated in all clinical situations. eGFR's persistently <60 mL/min signify possible Chronic Kidney Disease.    Anion gap 8 5 - 15    Comment: Performed at [facility], [address], [place], [place] [address]  Glucose, capillary     Status: Abnormal   Collection Time: 05/28/18  7:32 AM  Result Value Ref Range   Glucose-Capillary 135 (H) 70 - 99 mg/dL  Glucose, capillary     Status: Abnormal   Collection Time: 05/28/18 11:31 AM  Result Value Ref Range   Glucose-Capillary 114 (H) 70 - 99 mg/dL  Glucose, capillary     Status: Abnormal   Collection Time: 05/28/18  3:52 PM  Result Value Ref Range   Glucose-Capillary 131 (H) 70 - 99 mg/dL  Glucose, capillary     Status: Abnormal   Collection Time: 05/28/18  7:39 PM  Result Value Ref Range   Glucose-Capillary 163 (H) 70 - 99 mg/dL  Glucose, capillary     Status: Abnormal   Collection Time: 05/28/18 11:49 PM  Result Value Ref Range   Glucose-Capillary 148 (H) 70 - 99 mg/dL  CBC     Status: Abnormal   Collection Time: 05/29/18  4:01 AM  Result Value Ref Range   WBC 8.5 4.0 - 10.5 K/uL   RBC 3.75 (L) 4.22 - 5.81 MIL/uL   Hemoglobin 10.4 (L) 13.0 - 17.0 g/dL   HCT 34.8 (L) 39.0 - 52.0 %   MCV 92.8 78.0 - 100.0 fL   MCH 27.7 26.0 - 34.0 pg   MCHC 29.9 (L) 30.0 - 36.0 g/dL   RDW 16.6 (H) 11.5 - 15.5 %   Platelets 235 150 - 400 K/uL    Comment: Performed at [facility], [address], [place], [place] [address]  Basic metabolic panel     Status: Abnormal   Collection Time: 05/29/18  4:01 AM  Result Value Ref Range   Sodium 149 (H) 135 - 145 mmol/L   Potassium 3.5 3.5 - 5.1 mmol/L   Chloride 117 (H) 98 - 111 mmol/L   CO2 23 22 - 32 mmol/L   Glucose, Bld 162 (H) 70 -  99 mg/dL   BUN 15 8 - 23 mg/dL   Creatinine, Ser 0.90 0.61 - 1.24 mg/dL   Calcium 9.1 8.9 - 10.3 mg/dL   GFR calc non Af Amer >60 >60  mL/min   GFR calc Af Amer >60 >60 mL/min    Comment: (NOTE) The eGFR has been calculated using the CKD EPI equation. This calculation has not been validated in all clinical situations. eGFR's persistently <60 mL/min signify possible Chronic Kidney Disease.    Anion gap 9 5 - 15    Comment: Performed at [facility], [address] [place], [place] [address]  Glucose, capillary     Status: Abnormal   Collection Time: 05/29/18  4:05 AM  Result Value Ref Range   Glucose-Capillary 140 (H) 70 - 99 mg/dL  Blood gas, arterial     Status: Abnormal   Collection Time: 05/29/18  6:07 AM  Result Value Ref Range   FIO2 35.00    Delivery systems VENTILATOR    Mode PRESSURE REGULATED VOLUME CONTROL    VT 620 mL   LHR 18 resp/min   Peep/cpap 5.0 cm H20   pH, Arterial 7.457 (H) 7.350 - 7.450   pCO2 arterial 34.2 32.0 - 48.0 mmHg   pO2, Arterial 106 83.0 - 108.0 mmHg   Bicarbonate 25.0 20.0 - 28.0 mmol/L   Acid-Base Excess 0.4 0.0 - 2.0 mmol/L   O2 Saturation 97.9 %   Patient temperature 37.0    Collection site LEFT RADIAL    Drawn by [id number]    Sample type ARTERIAL    Allens test (pass/fail) PASS PASS    Comment: Performed at [facility], [address], [place], [place] [address]  Glucose, capillary     Status: Abnormal   Collection Time: 05/29/18  7:19 AM  Result Value Ref Range   Glucose-Capillary 134 (H) 70 - 99 mg/dL    ABGS Recent Labs    05/29/18 0607  PHART 7.457*  PO2ART 106  HCO3 25.0   CULTURES Recent Results (from the past 240 hour(s))  Culture, blood (routine x 2)     Status: Abnormal   Collection Time: 05/24/18  6:54 PM  Result Value Ref Range Status   Specimen Description   Final    BLOOD RIGHT HAND Performed at [facility], [address], [place], [place] [address]    Special Requests   Final    BOTTLES DRAWN AEROBIC AND ANAEROBIC Blood Culture adequate volume Performed at [facility], [address], [place], [place] [address]    Culture  Setup Time    Final    GRAM POSITIVE COCCI ANAEROBIC BOTTLE GRAM STAINED RESULT CALLED TO/READ BACK FROM MISTY SMART AT 1300  BY HFLYNT 05/25/18 CRITICAL RESULT CALLED TO, READ BACK BY AND VERIFIED WITH: RN E MURPHY [phone] 1824 MLM    Culture (A)  Final    STAPHYLOCOCCUS SPECIES (COAGULASE NEGATIVE) THE SIGNIFICANCE OF ISOLATING THIS ORGANISM FROM A SINGLE SET OF BLOOD CULTURES WHEN MULTIPLE SETS ARE DRAWN IS UNCERTAIN. PLEASE NOTIFY THE MICROBIOLOGY DEPARTMENT WITHIN ONE WEEK IF SPECIATION AND SENSITIVITIES ARE REQUIRED. Performed at [facility], [place] [address] [place], [place] [address]    Report Status 05/27/2018 FINAL  Final  Blood Culture ID Panel (Reflexed)     Status: Abnormal   Collection Time: 05/24/18  6:54 PM  Result Value Ref Range Status   Enterococcus species NOT DETECTED NOT DETECTED Final   Listeria monocytogenes NOT DETECTED NOT DETECTED Final   Staphylococcus  species DETECTED (A) NOT DETECTED Final    Comment: Methicillin (oxacillin) resistant coagulase negative staphylococcus. Possible blood culture contaminant (unless isolated from more than one blood culture draw or clinical case suggests pathogenicity). No antibiotic treatment is indicated for blood  culture contaminants. CRITICAL RESULT CALLED TO, READ BACK BY AND VERIFIED WITH: RN E MURPHY [phone] 1824 MLM    Staphylococcus aureus NOT DETECTED NOT DETECTED Final   Methicillin resistance DETECTED (A) NOT DETECTED Final    Comment: CRITICAL RESULT CALLED TO, READ BACK BY AND VERIFIED WITH: RN E MURPHY [phone] 1824 MLM    Streptococcus species NOT DETECTED NOT DETECTED Final   Streptococcus agalactiae NOT DETECTED NOT DETECTED Final   Streptococcus pneumoniae NOT DETECTED NOT DETECTED Final   Streptococcus pyogenes NOT DETECTED NOT DETECTED Final   Acinetobacter baumannii NOT DETECTED NOT DETECTED Final   Enterobacteriaceae species NOT DETECTED NOT DETECTED Final   Enterobacter cloacae complex NOT DETECTED NOT DETECTED  Final   Escherichia coli NOT DETECTED NOT DETECTED Final   Klebsiella oxytoca NOT DETECTED NOT DETECTED Final   Klebsiella pneumoniae NOT DETECTED NOT DETECTED Final   Proteus species NOT DETECTED NOT DETECTED Final   Serratia marcescens NOT DETECTED NOT DETECTED Final   Haemophilus influenzae NOT DETECTED NOT DETECTED Final   Neisseria meningitidis NOT DETECTED NOT DETECTED Final   Pseudomonas aeruginosa NOT DETECTED NOT DETECTED Final   Candida albicans NOT DETECTED NOT DETECTED Final   Candida glabrata NOT DETECTED NOT DETECTED Final   Candida krusei NOT DETECTED NOT DETECTED Final   Candida parapsilosis NOT DETECTED NOT DETECTED Final   Candida tropicalis NOT DETECTED NOT DETECTED Final    Comment: Performed at [facility], [address], [place], [place] [address]  Culture, blood (routine x 2)     Status: None   Collection Time: 05/24/18  7:03 PM  Result Value Ref Range Status   Specimen Description BLOOD LEFT HAND  Final   Special Requests   Final    BOTTLES DRAWN AEROBIC AND ANAEROBIC Blood Culture adequate volume   Culture   Final    NO GROWTH 5 DAYS Performed at [facility], [address] [place], [place] [address]    Report Status 05/29/2018 FINAL  Final  MRSA PCR Screening     Status: None   Collection Time: 05/25/18  8:34 PM  Result Value Ref Range Status   MRSA by PCR NEGATIVE NEGATIVE Final    Comment:        The GeneXpert MRSA Assay (FDA approved for NASAL specimens only), is one component of a comprehensive MRSA colonization surveillance program. It is not intended to diagnose MRSA infection nor to guide or monitor treatment for MRSA infections. Performed at [facility], [address], [place], [place] [address]    Studies/Results: Ct Head Wo Contrast  Result Date: 05/27/2018 CLINICAL DATA:  82 year old male with recent right basal ganglia lacunar infarct. Altered mental status, increasing lethargy and fever. EXAM: CT HEAD WITHOUT CONTRAST  TECHNIQUE: Contiguous axial images were obtained from the base of the skull through the vertex without intravenous contrast. COMPARISON:  Brain MRI 05/24/2018.  Head CT 05/30/2018. FINDINGS: Brain: Progressed hypodensity in the right basal ganglia compatible with cytotoxic edema related to the recent infarct. This appears stable to the diffusion abnormality on 05/24/2018. No associated hemorrhage. Minimal associated mass effect on the adjacent right lateral ventricle. No new cytotoxic edema identified. Stable gray-white matter differentiation elsewhere. No intracranial hemorrhage or ventriculomegaly. 15 millimeter calcified right  insula region meningioma again noted. Much smaller left anterior convexity meningioma is also stable. Vascular: Calcified atherosclerosis at the skull base. No suspicious intracranial vascular hyperdensity. Skull: No acute osseous abnormality identified. Sinuses/Orbits: Visualized paranasal sinuses and mastoids are stable and well pneumatized. Other: No acute orbit or scalp soft tissue finding. Partially visible endotracheal tube in the oral cavity. IMPRESSION: 1. Expected evolution of the recent right basal ganglia infarct with no associated hemorrhage and minimal mass effect. 2. No new acute intracranial abnormality identified. Electronically Signed   By: Genevie Ann M.D.   On: 05/27/2018 10:53   Dg Chest Port 1 View  Result Date: 05/29/2018 CLINICAL DATA:  Respiratory failure EXAM: PORTABLE CHEST 1 VIEW COMPARISON:  9/21/9 FINDINGS: Cardiac shadow is stable. Increasing left retrocardiac atelectatic changes are noted. Right lung remains clear. Endotracheal tube is noted in satisfactory position. No bony abnormality is seen. IMPRESSION: Increasing left retrocardiac atelectasis. Electronically Signed   By: Inez Catalina M.D.   On: 05/29/2018 07:28    Medications:  Prior to Admission:  Medications Prior to Admission  Medication Sig Dispense Refill Last Dose  . Acetaminophen 500 MG  coapsule Take 500 mg by mouth as needed for pain.    unknown  . amLODipine (NORVASC) 5 MG tablet TAKE 1 TABLET BY MOUTH DAILY. (Patient taking differently: Take 5 mg by mouth daily. ) 90 tablet 1 06/02/2018 at Unknown time  . atorvastatin (LIPITOR) 40 MG tablet Take 1 tablet (40 mg total) by mouth daily. 90 tablet 1 05/18/2018 at Unknown time  . benazepril (LOTENSIN) 40 MG tablet Take 1 tablet (40 mg total) by mouth 2 (two) times daily. 180 tablet 1 05/24/2018 at Unknown time  . betamethasone dipropionate 0.05 % lotion Apply topically 2 (two) times daily. (Patient taking differently: Apply 1 application topically 2 (two) times daily as needed (for irritation). ) 120 mL 0 unknown  . colchicine 0.6 MG tablet Take 1 tablet (0.6 mg total) by mouth daily. Continue daily until you have had no pain in your foot for 2 days.  Then stop. (Patient taking differently: Take 0.6 mg by mouth daily as needed (for gout pain as directed). Continue daily until you have had no pain in your foot for 2 days.  Then stop.) 20 tablet 0 unknown  . fenofibrate 160 MG tablet TAKE 1 TABLET EVERY DAY (Patient taking differently: Take 160 mg by mouth daily. ) 90 tablet 0 05/25/2018 at Unknown time  . furosemide (LASIX) 20 MG tablet TAKE 1 TABLET TWICE DAILY (Patient taking differently: Take 20 mg by mouth 2 (two) times daily. TAKE 1 TABLET TWICE DAILY) 180 tablet 1 05/08/2018 at Unknown time  . gabapentin (NEURONTIN) 400 MG capsule TAKE 2 CAPSULES BY MOUTH 2 (TWO) TIMES DAILY. (Patient taking differently: Take 800 mg by mouth 2 (two) times daily. ) 360 capsule 0 05/19/2018 at Unknown time  . metFORMIN (GLUCOPHAGE) 500 MG tablet Take 500 mg by mouth 2 (two) times daily with a meal.    05/09/2018 at Unknown time  . potassium chloride SA (K-DUR,KLOR-CON) 20 MEQ tablet TAKE 2 TABLETS TWICE DAILY (Patient taking differently: Take 40 mEq by mouth 2 (two) times daily. ) 360 tablet 1 05/20/2018 at Unknown time  . rivaroxaban (XARELTO) 20 MG TABS  tablet Take 20 mg by mouth daily with supper.    05/10/2018 at Unknown time   Scheduled: . aspirin  300 mg Rectal Daily  . atorvastatin  80 mg Per Tube q1800  . chlorhexidine gluconate (MEDLINE  KIT)  15 mL Mouth Rinse BID  . enoxaparin (LOVENOX) injection  80 mg Subcutaneous Q12H  . feeding supplement (PRO-STAT SUGAR FREE 64)  30 mL Per Tube Daily  . insulin aspart  0-15 Units Subcutaneous Q4H  . ipratropium-albuterol  3 mL Nebulization Q6H  . mouth rinse  15 mL Mouth Rinse 10 times per day  . metoprolol tartrate  5 mg Intravenous Q6H  . sodium chloride flush  3 mL Intravenous Q12H   Continuous: . sodium chloride 10 mL/hr at 05/27/18 0500  . ampicillin-sulbactam (UNASYN) IV 3 g (05/28/18 2329)  . dextrose 5 % 1,000 mL with potassium chloride 40 mEq infusion 100 mL/hr at 05/29/18 0540  . famotidine (PEPCID) IV Stopped (05/28/18 1005)  . feeding supplement (VITAL AF 1.2 CAL)     KUV:JDYNXG chloride, acetaminophen **OR** acetaminophen, albuterol, docusate, fentaNYL (SUBLIMAZE) injection, fentaNYL (SUBLIMAZE) injection, hydrALAZINE, midazolam, midazolam, ondansetron **OR** ondansetron (ZOFRAN) IV, polyvinyl alcohol, sodium chloride flush  Assesment: He was admitted with a stroke.  Initially he was talking and had left-sided weakness.  After admission he developed increasing difficulty with his breathing and apparently aspirated.  This culminated in him being intubated and placed on mechanical ventilation.  He has remained on the ventilator since.  He had repeat CT which did not show hemorrhagic transformation of his ischemic stroke.  He has acute hypoxic respiratory failure related to aspiration pneumonia.  Chest x-ray today that I personally reviewed shows a little more infiltrate but this seems to be waxing and waning somewhat.  He was placed on CPAP over the weekend but had very little respiratory effort at that time.  Previously he had been breathing over the ventilator a little bit.  He  has chronic diastolic heart failure and had some problem with that because he needed fluid resuscitation for acute kidney injury.  He has acute kidney injury which has improved  He has atrial fib on anticoagulation.  He has diabetes with neuropathy  He has malnutrition and we have not been able to start tube feedings because feeding tube coils in his esophagus.  He may need TPN.  His son asked me today what the prognosis is and I told him frankly that it depends on how he is doing from the stroke point of view.  Palliative care consultation has been requested and I think that will help with goals of treatment. Principal Problem:   CVA (cerebral vascular accident) [facility]) Active Problems:   Essential hypertension   Peripheral neuropathy   Hyperlipidemia   Hypokalemia   Diabetes mellitus with neuropathy (HCC)   Paroxysmal atrial fibrillation (HCC)   AKI (acute kidney injury) ([place])   Chronic diastolic congestive heart failure (HCC)   Chronic anticoagulation   Iron deficiency anemia due to chronic blood loss   Acute respiratory failure (HCC)   Aspiration pneumonia of both lower lobes due to gastric secretions (HCC)   Malnutrition of moderate degree   Pressure injury of skin    Plan: Palliative care consultation.  Try weaning today.  I suspect he will not be able to come off today.  Even if he does well with weaning we may need to decompress the cuff because he had a lot of edema in his upper airway and pharynx from attempts to place a feeding tube.  If his family wants to continue with aggressive care then I would plan for TPN.    LOS: 6 days   Shatha Hooser L 05/29/2018, 8:11 AM

## 2018-05-29 NOTE — Progress Notes (Addendum)
PROGRESS NOTE    Keith Doyle  JGG:[id number] DOB: [birth year] DOA: 05/08/2018 PCP: Janora Norlander, DO   Brief Narrative:  82 y/o male admitted to the hospital with left sided weakness and acute right basal ganglia CVA. Shortly after admission, patient became increasingly lethargic and began having fevers. This was followed by development of respiratory distress and he became unresponsive. He was intubated for airway protection and respiratory distress. His decompensation was secondary to aspiration pneumonia. He is being treated with IV antibiotics, but has been slow to progress. Main barrier to weaning at this point has been his mental status and inability to participate in weaning. Pulmonology and neurology following. Palliative care consulted to help address goals of care.   Assessment & Plan:   Principal Problem:   CVA (cerebral vascular accident) [facility]) Active Problems:   Essential hypertension   Peripheral neuropathy   Hyperlipidemia   Hypokalemia   Diabetes mellitus with neuropathy (HCC)   Paroxysmal atrial fibrillation (HCC)   AKI (acute kidney injury) ([place])   Chronic diastolic congestive heart failure (HCC)   Chronic anticoagulation   Iron deficiency anemia due to chronic blood loss   Acute respiratory failure (HCC)   Aspiration pneumonia of both lower lobes due to gastric secretions (HCC)   Malnutrition of moderate degree   Pressure injury of skin   1. Acute CVA. Patient had presented with acute onset of LLE weakness. MRI brain confirmed right basal ganglia infarct. MRA head showed some atherosclerotic disease on the left side. Carotid dopplers did not show any significant stenosis bilaterally. Echo did not show any significant findings. Seen by physical therapy who recommended SNF. He is chronically on xarelto, but this has been switched to lovenox since he did not pass his swallow evaluation. Neurology has evaluated the patient. Patient has required minimal sedation  on ventilator and has not been very responsive. Repeat CT head was unrevealing. EEG has been ordered. 2. Acute respiratory failure with hypoxia. Related to aspiration pneumonia. Shortly after admission, patient became unresponsive, febrile and developed respiratory distress. He was intubated for airway protection and respiratory distress on 9/18.  He was started on intravenous antibiotics and has not had recurrent high fever spikes, but continues to have low grade temps.  He has had 1 out of 2 positive blood cultures for coagulase-negative staph which is suspected to be a contaminant.  Pulmonology following. Lactic acid was initially elevated, but has since trended down.  He will need to remain on the ventilator for now.  Multiple attempts have been made to pass OG to provide medications/feeding.  This repeatedly gets coiled in his hiatal hernia.  May need to consider PICC line for TPN if he is not able to extubate soon. Continue current treatments. 3. Paroxysmal atrial fibrillation on chronic Xarelto.  Currently on therapeutic dose Lovenox. Heart rate is currently stable on lopressor. Can resume xarelto once acute issues have resolved. 4. HLD.  Will resume statin once able to take p.o. medications.  LDL is above goal considering he has had a stroke. 5. Peripheral neuropathy. Gabapentin was placed on hold due to altered mental status. 6. Bilateral lower extremity edema. Mild elevation of BNP. Echo shows normal EF.  Initially had good urine output with Lasix, but further diuretics were held due to increasing creatinine 7. AKI. Likely related to diuretics.  Improved with IV fluids.  8. Diabetes. Metformin currently on hold. Continue sliding scale insulin.  Blood sugars have been stable 9. Goals of care. Palliative care consultation  pending. Patient has multiple medical problems that are affecting his quality of life. Mentally, he has not been responding well on the ventilator and has not been able to engage in  weaning process. He also has significant swallowing issues which precipitated aspiration. It would be helpful to clearly identify goals and how patient would want to approach these difficult questions in terms of possible PEG tube. Certainly, family will have a difficult time coming to terms with this, especially since patient was so functional (independent/driving) prior to stroke.   DVT prophylaxis: Full dose Lovenox Code Status: full code Family Communication: discussed with son at the bedside Disposition Plan: Patient will remain in the ICU at this time for ventilator management   Consultants:   Neurology  Pulmonology  Procedures:  Echo: - Left ventricle: The cavity size was normal. Wall thickness was increased increased in a pattern of mild to moderate LVH. Systolic function was normal. The estimated ejection fraction was in the range of 60% to 65%. Wall motion was normal; there were no regional wall motion abnormalities. - Aortic valve: Mildly calcified annulus. Trileaflet. - Aortic root: The aortic root was mildly ectatic. - Mitral valve: There was trivial regurgitation. - Left atrium: The atrium was moderately dilated. - Right atrium: The atrium was mildly dilated. Central venous pressure (est): 8 mm Hg. - Atrial septum: No defect or patent foramen ovale was identified. - Tricuspid valve: There was trivial regurgitation.  - Pericardium, extracardiac: There was no pericardial effusion.  Intubation 9/18 >  Antimicrobials:   Zosyn 9/18 > 9/19  Unasyn 9/19 > 9/23  Zosyn 9/23->  Subjective: Patient seen and evaluated today with son at bedside. No acute events overnight. He has required pushes of Versed and Fentanyl for sedation as he was biting the tube. He has also had some fevers overnight and is on a cooling blanket.  Objective: Vitals:   05/29/18 0300 05/29/18 0330 05/29/18 0400 05/29/18 0430  BP: (!) 153/83 (!) 148/79 (!) 147/99 (!) 143/91    Pulse: 96 90 (!) 33 96  Resp: (!) 21 (!) 21 (!) 24 20  Temp: 99.5 F (37.5 C) 99.5 F (37.5 C) 99.3 F (37.4 C) 99.5 F (37.5 C)  TempSrc:      SpO2: 100% 100% (!) 87% 100%  Weight:      Height:        Intake/Output Summary (Last 24 hours) at 05/29/2018 0704 Last data filed at 05/28/2018 2136 Gross per 24 hour  Intake 1120.23 ml  Output 950 ml  Net 170.23 ml   Filed Weights   05/26/18 0400 05/27/18 0500 05/28/18 0430  Weight: 83.4 kg 85 kg 81.6 kg    Examination:  General exam: Appears calm and comfortable  Respiratory system: Clear to auscultation. Respiratory effort normal. Intubated; FiO2 35% Cardiovascular system: S1 & S2 heard, RRR. No JVD, murmurs, rubs, gallops or clicks. No pedal edema. Gastrointestinal system: Abdomen is nondistended, soft and nontender. No organomegaly or masses felt. Normal bowel sounds heard. Central nervous system: Sedated. Extremities: Symmetric 5 x 5 power. Skin: No rashes, lesions or ulcers Psychiatry: Cannot be assessed.  Foley with clear, yellow, UO noted.    Data Reviewed: I have personally reviewed following labs and imaging studies  CBC: Recent Labs  Lab 05/20/2018 1743  05/25/18 0440 05/26/18 0358 05/27/18 0350 05/28/18 0551 05/29/18 0401  WBC 6.5   < > 11.1* 12.2* 8.6 5.9 8.5  NEUTROABS 4.3  --   --   --   --   --   --  HGB 10.5*   < > 10.9* 10.9* 10.3* 9.5* 10.4*  HCT 33.9*   < > 35.7* 35.7* 34.3* 31.6* 34.8*  MCV 91.4   < > 92.2 93.2 92.2 91.9 92.8  PLT 260   < > 263 257 255 252 235   < > = values in this interval not displayed.   Basic Metabolic Panel: Recent Labs  Lab 05/24/18 0555 05/25/18 0440 05/26/18 0358 05/26/18 1700 05/27/18 0350 05/27/18 1615 05/28/18 0551 05/29/18 0401  NA 148* 150* 148*  --  146*  --  149* 149*  K 3.4* 3.2* 3.6  --  3.4*  --  3.4* 3.5  CL 113* 113* 111  --  114*  --  117* 117*  CO2 _0 --  25  --  24 23  GLUCOSE 154* 161* 150*  --  147*  --  139* 162*  BUN 13 25* 32*   --  20  --  17 15  CREATININE 1.02 1.75* 1.78*  --  1.12  --  0.90 0.90  CALCIUM 9.6 9.2 9.0  --  8.8*  --  9.0 9.1  MG 2.0  --  2.2 2.1 2.1 2.0  --   --   PHOS  --   --  3.4 2.2* 2.5 2.6  --   --    GFR: Estimated Creatinine Clearance: 71.9 mL/min (by C-G formula based on SCr of 0.9 mg/dL). Liver Function Tests: Recent Labs  Lab 05/15/2018 1743 05/25/18 0440 05/27/18 0350  AST _1 ALT _2 ALKPHOS 51 43 45  BILITOT 0.5 0.9 0.6  PROT 7.2 6.7 6.3*  ALBUMIN 4.0 3.5 3.1*   No results for input(s): LIPASE, AMYLASE in the last 168 hours. Recent Labs  Lab 05/24/18 1854  AMMONIA 33   Coagulation Profile: Recent Labs  Lab 05/12/2018 1743  INR 1.10   Cardiac Enzymes: Recent Labs  Lab 05/13/2018 1743  TROPONINI <0.03   BNP (last 3 results) No results for input(s): PROBNP in the last 8760 hours. HbA1C: No results for input(s): HGBA1C in the last 72 hours. CBG: Recent Labs  Lab 05/28/18 1131 05/28/18 1552 05/28/18 1939 05/28/18 2349 05/29/18 0405  GLUCAP 114* 131* 163* 148* 140*   Lipid Profile: No results for input(s): CHOL, HDL, LDLCALC, TRIG, CHOLHDL, LDLDIRECT in the last 72 hours. Thyroid Function Tests: No results for input(s): TSH, T4TOTAL, FREET4, T3FREE, THYROIDAB in the last 72 hours. Anemia Panel: No results for input(s): VITAMINB12, FOLATE, FERRITIN, TIBC, IRON, RETICCTPCT in the last 72 hours. Sepsis Labs: Recent Labs  Lab 05/24/18 1854 05/24/18 2211  LATICACIDVEN 2.1* 1.5    Recent Results (from the past 240 hour(s))  Culture, blood (routine x 2)     Status: Abnormal   Collection Time: 05/24/18  6:54 PM  Result Value Ref Range Status   Specimen Description   Final    BLOOD RIGHT HAND Performed at [facility], [address], [place], [place] [address]    Special Requests   Final    BOTTLES DRAWN AEROBIC AND ANAEROBIC Blood Culture adequate volume Performed at [facility], [address], [place], [place] [address]    Culture  Setup  Time   Final    GRAM POSITIVE COCCI ANAEROBIC BOTTLE GRAM STAINED RESULT CALLED TO/READ BACK FROM MISTY SMART AT 1300  BY HFLYNT 05/25/18 CRITICAL RESULT CALLED TO, READ BACK BY AND VERIFIED WITH: RN E MURPHY [phone] 1824 MLM    Culture (A)  Final    STAPHYLOCOCCUS SPECIES (COAGULASE NEGATIVE) THE SIGNIFICANCE OF ISOLATING THIS ORGANISM FROM A SINGLE SET OF BLOOD CULTURES WHEN MULTIPLE SETS ARE DRAWN IS UNCERTAIN. PLEASE NOTIFY THE MICROBIOLOGY DEPARTMENT WITHIN ONE WEEK IF SPECIATION AND SENSITIVITIES ARE REQUIRED. Performed at [facility], [place] [address], [place], [place] [address]    Report Status 05/27/2018 FINAL  Final  Blood Culture ID Panel (Reflexed)     Status: Abnormal   Collection Time: 05/24/18  6:54 PM  Result Value Ref Range Status   Enterococcus species NOT DETECTED NOT DETECTED Final   Listeria monocytogenes NOT DETECTED NOT DETECTED Final   Staphylococcus species DETECTED (A) NOT DETECTED Final    Comment: Methicillin (oxacillin) resistant coagulase negative staphylococcus. Possible blood culture contaminant (unless isolated from more than one blood culture draw or clinical case suggests pathogenicity). No antibiotic treatment is indicated for blood  culture contaminants. CRITICAL RESULT CALLED TO, READ BACK BY AND VERIFIED WITH: RN E MURPHY [phone] 1824 MLM    Staphylococcus aureus NOT DETECTED NOT DETECTED Final   Methicillin resistance DETECTED (A) NOT DETECTED Final    Comment: CRITICAL RESULT CALLED TO, READ BACK BY AND VERIFIED WITH: RN E MURPHY [phone] 1824 MLM    Streptococcus species NOT DETECTED NOT DETECTED Final   Streptococcus agalactiae NOT DETECTED NOT DETECTED Final   Streptococcus pneumoniae NOT DETECTED NOT DETECTED Final   Streptococcus pyogenes NOT DETECTED NOT DETECTED Final   Acinetobacter baumannii NOT DETECTED NOT DETECTED Final   Enterobacteriaceae species NOT DETECTED NOT DETECTED Final   Enterobacter cloacae complex NOT DETECTED NOT  DETECTED Final   Escherichia coli NOT DETECTED NOT DETECTED Final   Klebsiella oxytoca NOT DETECTED NOT DETECTED Final   Klebsiella pneumoniae NOT DETECTED NOT DETECTED Final   Proteus species NOT DETECTED NOT DETECTED Final   Serratia marcescens NOT DETECTED NOT DETECTED Final   Haemophilus influenzae NOT DETECTED NOT DETECTED Final   Neisseria meningitidis NOT DETECTED NOT DETECTED Final   Pseudomonas aeruginosa NOT DETECTED NOT DETECTED Final   Candida albicans NOT DETECTED NOT DETECTED Final   Candida glabrata NOT DETECTED NOT DETECTED Final   Candida krusei NOT DETECTED NOT DETECTED Final   Candida parapsilosis NOT DETECTED NOT DETECTED Final   Candida tropicalis NOT DETECTED NOT DETECTED Final    Comment: Performed at [facility], [address] [place], [place] [address]  Culture, blood (routine x 2)     Status: None (Preliminary result)   Collection Time: 05/24/18  7:03 PM  Result Value Ref Range Status   Specimen Description BLOOD LEFT HAND  Final   Special Requests   Final    BOTTLES DRAWN AEROBIC AND ANAEROBIC Blood Culture adequate volume   Culture   Final    NO GROWTH 4 DAYS Performed at [facility], [address] [place], [place] [address]    Report Status PENDING  Incomplete  MRSA PCR Screening     Status: None   Collection Time: 05/25/18  8:34 PM  Result Value Ref Range Status   MRSA by PCR NEGATIVE NEGATIVE Final    Comment:        The GeneXpert MRSA Assay (FDA approved for NASAL specimens only), is one component of a comprehensive MRSA colonization surveillance program. It is not intended to diagnose MRSA infection nor to guide or monitor treatment for MRSA infections. Performed at [facility], [address], [place], [place] [address]          Radiology Studies:  Ct Head Wo Contrast  Result Date: 05/27/2018 CLINICAL DATA:  82 year old male with recent right basal ganglia lacunar infarct. Altered mental status, increasing lethargy and  fever. EXAM: CT HEAD WITHOUT CONTRAST TECHNIQUE: Contiguous axial images were obtained from the base of the skull through the vertex without intravenous contrast. COMPARISON:  Brain MRI 05/24/2018.  Head CT 05/07/2018. FINDINGS: Brain: Progressed hypodensity in the right basal ganglia compatible with cytotoxic edema related to the recent infarct. This appears stable to the diffusion abnormality on 05/24/2018. No associated hemorrhage. Minimal associated mass effect on the adjacent right lateral ventricle. No new cytotoxic edema identified. Stable gray-white matter differentiation elsewhere. No intracranial hemorrhage or ventriculomegaly. 15 millimeter calcified right insula region meningioma again noted. Much smaller left anterior convexity meningioma is also stable. Vascular: Calcified atherosclerosis at the skull base. No suspicious intracranial vascular hyperdensity. Skull: No acute osseous abnormality identified. Sinuses/Orbits: Visualized paranasal sinuses and mastoids are stable and well pneumatized. Other: No acute orbit or scalp soft tissue finding. Partially visible endotracheal tube in the oral cavity. IMPRESSION: 1. Expected evolution of the recent right basal ganglia infarct with no associated hemorrhage and minimal mass effect. 2. No new acute intracranial abnormality identified. Electronically Signed   By: Genevie Ann M.D.   On: 05/27/2018 10:53   Dg Chest Port 1 View  Result Date: 05/27/2018 CLINICAL DATA:  Respiratory failure. EXAM: PORTABLE CHEST 1 VIEW COMPARISON:  Chest x-rays dated 05/26/2018 and 05/25/2018 FINDINGS: Endotracheal tube is 5 cm above the carina. Heart size and pulmonary vascularity are normal. There is a small patchy area of infiltrate at the left lung base. Lungs are otherwise clear. IMPRESSION: 1. Endotracheal tube in good position. 2. Small patchy infiltrate at the left lung base, improved since the study of 05/25/2018. Electronically Signed   By: Lorriane Shire M.D.   On:  05/27/2018 08:53        Scheduled Meds: . aspirin  300 mg Rectal Daily  . atorvastatin  80 mg Per Tube q1800  . chlorhexidine gluconate (MEDLINE KIT)  15 mL Mouth Rinse BID  . enoxaparin (LOVENOX) injection  80 mg Subcutaneous Q12H  . feeding supplement (PRO-STAT SUGAR FREE 64)  30 mL Per Tube Daily  . insulin aspart  0-15 Units Subcutaneous Q4H  . ipratropium-albuterol  3 mL Nebulization Q6H  . mouth rinse  15 mL Mouth Rinse 10 times per day  . metoprolol tartrate  5 mg Intravenous Q6H  . sodium chloride flush  3 mL Intravenous Q12H   Continuous Infusions: . sodium chloride 10 mL/hr at 05/27/18 0500  . ampicillin-sulbactam (UNASYN) IV 3 g (05/28/18 2329)  . dextrose 5 % 1,000 mL with potassium chloride 40 mEq infusion 100 mL/hr at 05/29/18 0540  . famotidine (PEPCID) IV Stopped (05/28/18 1005)  . feeding supplement (VITAL AF 1.2 CAL)       LOS: 6 days    Time spent: 30 minutes    Azazel Franze Darleen Crocker, DO [facility] Pager [phone]  If 7PM-7AM, please contact night-coverage [email or web address] Password [facility] 05/29/2018, 7:04 AM

## 2018-05-30 ENCOUNTER — Ambulatory Visit: Payer: Self-pay

## 2018-05-30 ENCOUNTER — Inpatient Hospital Stay (HOSPITAL_COMMUNITY): Payer: Medicare HMO

## 2018-05-30 LAB — CBC
HEMATOCRIT: 29.7 % — AB (ref 39.0–52.0)
HEMOGLOBIN: 9 g/dL — AB (ref 13.0–17.0)
MCH: 27.8 pg (ref 26.0–34.0)
MCHC: 30.3 g/dL (ref 30.0–36.0)
MCV: 91.7 fL (ref 78.0–100.0)
Platelets: 238 10*3/uL (ref 150–400)
RBC: 3.24 MIL/uL — AB (ref 4.22–5.81)
RDW: 16.7 % — ABNORMAL HIGH (ref 11.5–15.5)
WBC: 6.4 10*3/uL (ref 4.0–10.5)

## 2018-05-30 LAB — COMPREHENSIVE METABOLIC PANEL
ALBUMIN: 2.5 g/dL — AB (ref 3.5–5.0)
ALT: 19 U/L (ref 0–44)
ANION GAP: 8 (ref 5–15)
AST: 22 U/L (ref 15–41)
Alkaline Phosphatase: 80 U/L (ref 38–126)
BUN: 15 mg/dL (ref 8–23)
CHLORIDE: 117 mmol/L — AB (ref 98–111)
CO2: 21 mmol/L — AB (ref 22–32)
CREATININE: 0.92 mg/dL (ref 0.61–1.24)
Calcium: 8.7 mg/dL — ABNORMAL LOW (ref 8.9–10.3)
GFR calc non Af Amer: >60 mL/min (ref >60)
GLUCOSE: 150 mg/dL — AB (ref 70–99)
Potassium: 3.9 mmol/L (ref 3.5–5.1)
SODIUM: 146 mmol/L — AB (ref 135–145)
Total Bilirubin: 0.6 mg/dL (ref 0.3–1.2)
Total Protein: 5.6 g/dL — ABNORMAL LOW (ref 6.5–8.1)

## 2018-05-30 LAB — BLOOD GAS, ARTERIAL
Acid-base deficit: 1 mmol/L (ref 0.0–2.0)
Bicarbonate: 24 mmol/L (ref 20.0–28.0)
Drawn by: 213101
FIO2: 35
MECHVT: 620 mL
O2 Saturation: 98.5 %
PATIENT TEMPERATURE: 37
PCO2 ART: 32.3 mmHg (ref 32.0–48.0)
PEEP: 5 cmH2O
PO2 ART: 121 mmHg — AB (ref 83.0–108.0)
RATE: 18 resp/min
pH, Arterial: 7.455 — ABNORMAL HIGH (ref 7.350–7.450)

## 2018-05-30 LAB — DIFFERENTIAL
BASOS PCT: 1 %
Basophils Absolute: 0 10*3/uL (ref 0.0–0.1)
EOS ABS: 0.2 10*3/uL (ref 0.0–0.7)
Eosinophils Relative: 3 %
Lymphocytes Relative: 22 %
Lymphs Abs: 1.4 10*3/uL (ref 0.7–4.0)
Monocytes Absolute: 0.4 10*3/uL (ref 0.1–1.0)
Monocytes Relative: 7 %
NEUTROS PCT: 67 %
Neutro Abs: 4.4 10*3/uL (ref 1.7–7.7)

## 2018-05-30 LAB — GLUCOSE, CAPILLARY
GLUCOSE-CAPILLARY: 143 mg/dL — AB (ref 70–99)
GLUCOSE-CAPILLARY: 108 mg/dL — AB (ref 70–99)
GLUCOSE-CAPILLARY: 124 mg/dL — AB (ref 70–99)
GLUCOSE-CAPILLARY: 144 mg/dL — AB (ref 70–99)
Glucose-Capillary: 135 mg/dL — ABNORMAL HIGH (ref 70–99)

## 2018-05-30 LAB — PREALBUMIN: Prealbumin: 11.3 mg/dL — ABNORMAL LOW (ref 18–38)

## 2018-05-30 LAB — PHOSPHORUS: PHOSPHORUS: 3.3 mg/dL (ref 2.5–4.6)

## 2018-05-30 LAB — MAGNESIUM: MAGNESIUM: 2.2 mg/dL (ref 1.7–2.4)

## 2018-05-30 LAB — TRIGLYCERIDES: Triglycerides: 198 mg/dL — ABNORMAL HIGH (ref <150)

## 2018-05-30 MED ORDER — CHLORHEXIDINE GLUCONATE CLOTH 2 % EX PADS
6.0000 | MEDICATED_PAD | Freq: Every day | CUTANEOUS | Status: DC
  Administered 2018-05-30 – 2018-06-06 (×8): 6 via TOPICAL

## 2018-05-30 MED ORDER — CHLORHEXIDINE GLUCONATE CLOTH 2 % EX PADS
6.0000 | MEDICATED_PAD | Freq: Every day | CUTANEOUS | Status: DC
  Administered 2018-05-30 – 2018-06-06 (×7): 6 via TOPICAL

## 2018-05-30 MED ORDER — SODIUM CHLORIDE 0.9% FLUSH: 10.0000 mL | INTRAVENOUS | Status: DC | PRN

## 2018-05-30 MED ORDER — TRACE MINERALS CR-CU-MN-SE-ZN 10-1000-500-60 MCG/ML IV SOLN
INTRAVENOUS | Status: AC
  Administered 2018-05-30: 18:00:00 via INTRAVENOUS
  Filled 2018-05-30: qty 960

## 2018-05-30 MED ORDER — SODIUM CHLORIDE 0.9% FLUSH
10.0000 mL | Freq: Two times a day (BID) | INTRAVENOUS | Status: DC
  Administered 2018-05-30 – 2018-06-06 (×13): 10 mL

## 2018-05-30 MED ORDER — POTASSIUM CHLORIDE 2 MEQ/ML IV SOLN
INTRAVENOUS | Status: DC
  Administered 2018-05-30: 18:00:00 via INTRAVENOUS
  Filled 2018-05-30 (×3): qty 1000

## 2018-05-30 NOTE — Progress Notes (Signed)
Per Dr. Manuella Ghazi and Dr. Luan [place], Heppner to place picc line today.

## 2018-05-30 NOTE — Progress Notes (Signed)
Called Dr. Merlene Laughter office and left message per pt's family request. They have concerns following the plan of care and where to go from here. Family talked with Dr. Manuella Ghazi and Dr. Luan [place]ing this morning but still requesting to talk with Dr. Merlene Laughter before decisions are made concerning the pt's care. Will continue to follow up.   Jeris Penta, RN

## 2018-05-30 NOTE — Progress Notes (Signed)
Subjective: He is overall about the same.  He still has some low-grade fever.  He did well with weaning yesterday and maintained his volumes but really was not very responsive.  He did have gag reflex.  He did not wake up however.  He is still not very responsive but he has received some sedation.  Objective: Vital signs in last 24 hours: Temp:  [100 F (37.8 C)-101.1 F (38.4 C)] 100.4 F (38 C) (09/24 0708) Pulse Rate:  [53-112] 79 (09/24 0708) Resp:  [19-29] 19 (09/24 0708) BP: (101-167)/(58-97) 147/74 (09/24 0600) SpO2:  [91 %-100 %] 99 % (09/24 0708) FiO2 (%):  [35 %] 35 % (09/24 0351) Weight:  [80.5 kg-81.8 kg] 81.8 kg (09/24 0500) Weight change: -1.1 kg Last BM Date: (unknown)  Intake/Output from previous day: 09/23 0701 - 09/24 0700 In: 2178.9 [I.V.:1988.8; IV Piggyback:190.2] Out: 1850 [Urine:1850]  PHYSICAL EXAM General appearance: Intubated, sedated on mechanical ventilation Resp: rhonchi bilaterally Cardio: irregularly irregular rhythm GI: soft, non-tender; bowel sounds normal; no masses,  no organomegaly Extremities: extremities normal, atraumatic, no cyanosis or edema  Lab Results:  Results for orders placed or performed during the hospital encounter of 06/05/2018 (from the past 48 hour(s))  Glucose, capillary     Status: Abnormal   Collection Time: 05/28/18 11:31 AM  Result Value Ref Range   Glucose-Capillary 114 (H) 70 - 99 mg/dL  Glucose, capillary     Status: Abnormal   Collection Time: 05/28/18  3:52 PM  Result Value Ref Range   Glucose-Capillary 131 (H) 70 - 99 mg/dL  Glucose, capillary     Status: Abnormal   Collection Time: 05/28/18  7:39 PM  Result Value Ref Range   Glucose-Capillary 163 (H) 70 - 99 mg/dL  Glucose, capillary     Status: Abnormal   Collection Time: 05/28/18 11:49 PM  Result Value Ref Range   Glucose-Capillary 148 (H) 70 - 99 mg/dL  CBC     Status: Abnormal   Collection Time: 05/29/18  4:01 AM  Result Value Ref Range   WBC 8.5  4.0 - 10.5 K/uL   RBC 3.75 (L) 4.22 - 5.81 MIL/uL   Hemoglobin 10.4 (L) 13.0 - 17.0 g/dL   HCT 34.8 (L) 39.0 - 52.0 %   MCV 92.8 78.0 - 100.0 fL   MCH 27.7 26.0 - 34.0 pg   MCHC 29.9 (L) 30.0 - 36.0 g/dL   RDW 16.6 (H) 11.5 - 15.5 %   Platelets 235 150 - 400 K/uL    Comment: Performed at [facility], [address] [place], [place] [address]  Basic metabolic panel     Status: Abnormal   Collection Time: 05/29/18  4:01 AM  Result Value Ref Range   Sodium 149 (H) 135 - 145 mmol/L   Potassium 3.5 3.5 - 5.1 mmol/L   Chloride 117 (H) 98 - 111 mmol/L   CO2 23 22 - 32 mmol/L   Glucose, Bld 162 (H) 70 - 99 mg/dL   BUN 15 8 - 23 mg/dL   Creatinine, Ser 0.90 0.61 - 1.24 mg/dL   Calcium 9.1 8.9 - 10.3 mg/dL   GFR calc non Af Amer >60 >60 mL/min   GFR calc Af Amer >60 >60 mL/min    Comment: (NOTE) The eGFR has been calculated using the CKD EPI equation. This calculation has not been validated in all clinical situations. eGFR's persistently <60 mL/min signify possible Chronic Kidney Disease.    Anion gap 9 5 - 15  Comment: Performed at [facility], [address] [place], [place] [address]  Glucose, capillary     Status: Abnormal   Collection Time: 05/29/18  4:05 AM  Result Value Ref Range   Glucose-Capillary 140 (H) 70 - 99 mg/dL  Blood gas, arterial     Status: Abnormal   Collection Time: 05/29/18  6:07 AM  Result Value Ref Range   FIO2 35.00    Delivery systems VENTILATOR    Mode PRESSURE REGULATED VOLUME CONTROL    VT 620 mL   LHR 18 resp/min   Peep/cpap 5.0 cm H20   pH, Arterial 7.457 (H) 7.350 - 7.450   pCO2 arterial 34.2 32.0 - 48.0 mmHg   pO2, Arterial 106 83.0 - 108.0 mmHg   Bicarbonate 25.0 20.0 - 28.0 mmol/L   Acid-Base Excess 0.4 0.0 - 2.0 mmol/L   O2 Saturation 97.9 %   Patient temperature 37.0    Collection site LEFT RADIAL    Drawn by [id number]    Sample type ARTERIAL    Allens test (pass/fail) PASS PASS    Comment: Performed at [facility], [address], [place], [place] [address]  Glucose, capillary     Status: Abnormal   Collection Time: 05/29/18  7:19 AM  Result Value Ref Range   Glucose-Capillary 134 (H) 70 - 99 mg/dL  Glucose, capillary     Status: Abnormal   Collection Time: 05/29/18 11:10 AM  Result Value Ref Range   Glucose-Capillary 140 (H) 70 - 99 mg/dL  Glucose, capillary     Status: Abnormal   Collection Time: 05/29/18  4:04 PM  Result Value Ref Range   Glucose-Capillary 135 (H) 70 - 99 mg/dL  Glucose, capillary     Status: Abnormal   Collection Time: 05/29/18  7:41 PM  Result Value Ref Range   Glucose-Capillary 128 (H) 70 - 99 mg/dL  Glucose, capillary     Status: Abnormal   Collection Time: 05/29/18 11:58 PM  Result Value Ref Range   Glucose-Capillary 132 (H) 70 - 99 mg/dL  CBC     Status: Abnormal   Collection Time: 05/30/18  3:37 AM  Result Value Ref Range   WBC 6.4 4.0 - 10.5 K/uL   RBC 3.24 (L) 4.22 - 5.81 MIL/uL   Hemoglobin 9.0 (L) 13.0 - 17.0 g/dL   HCT 29.7 (L) 39.0 - 52.0 %   MCV 91.7 78.0 - 100.0 fL   MCH 27.8 26.0 - 34.0 pg   MCHC 30.3 30.0 - 36.0 g/dL   RDW 16.7 (H) 11.5 - 15.5 %   Platelets 238 150 - 400 K/uL    Comment: Performed at [facility], [address], [place], [place] [address]  Comprehensive metabolic panel     Status: Abnormal   Collection Time: 05/30/18  3:37 AM  Result Value Ref Range   Sodium 146 (H) 135 - 145 mmol/L   Potassium 3.9 3.5 - 5.1 mmol/L   Chloride 117 (H) 98 - 111 mmol/L   CO2 21 (L) 22 - 32 mmol/L   Glucose, Bld 150 (H) 70 - 99 mg/dL   BUN 15 8 - 23 mg/dL   Creatinine, Ser 0.92 0.61 - 1.24 mg/dL   Calcium 8.7 (L) 8.9 - 10.3 mg/dL   Total Protein 5.6 (L) 6.5 - 8.1 g/dL   Albumin 2.5 (L) 3.5 - 5.0 g/dL   AST 22 15 - 41 U/L   ALT 19 0 - 44 U/L   Alkaline Phosphatase 80 38 -  126 U/L   Total Bilirubin 0.6 0.3 - 1.2 mg/dL   GFR calc non Af Amer >60 >60 mL/min   GFR calc Af Amer >60 >60 mL/min    Comment: (NOTE) The eGFR has been calculated using the CKD EPI  equation. This calculation has not been validated in all clinical situations. eGFR's persistently <60 mL/min signify possible Chronic Kidney Disease.    Anion gap 8 5 - 15    Comment: Performed at [facility], [address], [place], [place] [address]  Magnesium     Status: None   Collection Time: 05/30/18  3:37 AM  Result Value Ref Range   Magnesium 2.2 1.7 - 2.4 mg/dL    Comment: Performed at [facility], [address] [place], [place] [address]  Phosphorus     Status: None   Collection Time: 05/30/18  3:37 AM  Result Value Ref Range   Phosphorus 3.3 2.5 - 4.6 mg/dL    Comment: Performed at [facility], [address] [place], [place] [address]  Triglycerides     Status: Abnormal   Collection Time: 05/30/18  3:37 AM  Result Value Ref Range   Triglycerides 198 (H) <150 mg/dL    Comment: Performed at [facility], [address], [place], [place] [address]  Differential     Status: None   Collection Time: 05/30/18  3:37 AM  Result Value Ref Range   Neutrophils Relative % 67 %   Neutro Abs 4.4 1.7 - 7.7 K/uL   Lymphocytes Relative 22 %   Lymphs Abs 1.4 0.7 - 4.0 K/uL   Monocytes Relative 7 %   Monocytes Absolute 0.4 0.1 - 1.0 K/uL   Eosinophils Relative 3 %   Eosinophils Absolute 0.2 0.0 - 0.7 K/uL   Basophils Relative 1 %   Basophils Absolute 0.0 0.0 - 0.1 K/uL    Comment: Performed at [facility], [address], [place], [place] [address]  Glucose, capillary     Status: Abnormal   Collection Time: 05/30/18  3:40 AM  Result Value Ref Range   Glucose-Capillary 135 (H) 70 - 99 mg/dL  Blood gas, arterial     Status: Abnormal   Collection Time: 05/30/18  4:05 AM  Result Value Ref Range   FIO2 35.00    Delivery systems VENTILATOR    Mode PRESSURE REGULATED VOLUME CONTROL    VT 620 mL   LHR 18.0 resp/min   Peep/cpap 5.0 cm H20   pH, Arterial 7.455 (H) 7.350 - 7.450   pCO2 arterial 32.3 32.0 - 48.0 mmHg   pO2, Arterial 121.0 (H) 83.0 - 108.0 mmHg   Bicarbonate 24.0  20.0 - 28.0 mmol/L   Acid-base deficit 1.0 0.0 - 2.0 mmol/L   O2 Saturation 98.5 %   Patient temperature 37.0    Collection site RIGHT RADIAL    Drawn by [id number]    Sample type ARTERIAL    Allens test (pass/fail) PASS PASS    Comment: Performed at [facility], [address] [place], [place] [address]  Glucose, capillary     Status: Abnormal   Collection Time: 05/30/18  7:07 AM  Result Value Ref Range   Glucose-Capillary 143 (H) 70 - 99 mg/dL    ABGS Recent Labs    05/30/18 0405  PHART 7.455*  PO2ART 121.0*  HCO3 24.0   CULTURES Recent Results (from the past 240 hour(s))  Culture, blood (routine x 2)     Status: Abnormal   Collection Time: 05/24/18  6:54  PM  Result Value Ref Range Status   Specimen Description   Final    BLOOD RIGHT HAND Performed at [facility], [address] [place], [place], [place] [address]    Special Requests   Final    BOTTLES DRAWN AEROBIC AND ANAEROBIC Blood Culture adequate volume Performed at [facility], [address], [place], [place] [address]    Culture  Setup Time   Final    GRAM POSITIVE COCCI ANAEROBIC BOTTLE GRAM STAINED RESULT CALLED TO/READ BACK FROM MISTY SMART AT 1300  BY HFLYNT 05/25/18 CRITICAL RESULT CALLED TO, READ BACK BY AND VERIFIED WITH: RN E MURPHY [phone] 1824 MLM    Culture (A)  Final    STAPHYLOCOCCUS SPECIES (COAGULASE NEGATIVE) THE SIGNIFICANCE OF ISOLATING THIS ORGANISM FROM A SINGLE SET OF BLOOD CULTURES WHEN MULTIPLE SETS ARE DRAWN IS UNCERTAIN. PLEASE NOTIFY THE MICROBIOLOGY DEPARTMENT WITHIN ONE WEEK IF SPECIATION AND SENSITIVITIES ARE REQUIRED. Performed at [facility], [place] [address] [place], [place] [address]    Report Status 05/27/2018 FINAL  Final  Blood Culture ID Panel (Reflexed)     Status: Abnormal   Collection Time: 05/24/18  6:54 PM  Result Value Ref Range Status   Enterococcus species NOT DETECTED NOT DETECTED Final   Listeria monocytogenes NOT DETECTED NOT DETECTED Final   Staphylococcus  species DETECTED (A) NOT DETECTED Final    Comment: Methicillin (oxacillin) resistant coagulase negative staphylococcus. Possible blood culture contaminant (unless isolated from more than one blood culture draw or clinical case suggests pathogenicity). No antibiotic treatment is indicated for blood  culture contaminants. CRITICAL RESULT CALLED TO, READ BACK BY AND VERIFIED WITH: RN E MURPHY [phone] 1824 MLM    Staphylococcus aureus NOT DETECTED NOT DETECTED Final   Methicillin resistance DETECTED (A) NOT DETECTED Final    Comment: CRITICAL RESULT CALLED TO, READ BACK BY AND VERIFIED WITH: RN E MURPHY [phone] 1824 MLM    Streptococcus species NOT DETECTED NOT DETECTED Final   Streptococcus agalactiae NOT DETECTED NOT DETECTED Final   Streptococcus pneumoniae NOT DETECTED NOT DETECTED Final   Streptococcus pyogenes NOT DETECTED NOT DETECTED Final   Acinetobacter baumannii NOT DETECTED NOT DETECTED Final   Enterobacteriaceae species NOT DETECTED NOT DETECTED Final   Enterobacter cloacae complex NOT DETECTED NOT DETECTED Final   Escherichia coli NOT DETECTED NOT DETECTED Final   Klebsiella oxytoca NOT DETECTED NOT DETECTED Final   Klebsiella pneumoniae NOT DETECTED NOT DETECTED Final   Proteus species NOT DETECTED NOT DETECTED Final   Serratia marcescens NOT DETECTED NOT DETECTED Final   Haemophilus influenzae NOT DETECTED NOT DETECTED Final   Neisseria meningitidis NOT DETECTED NOT DETECTED Final   Pseudomonas aeruginosa NOT DETECTED NOT DETECTED Final   Candida albicans NOT DETECTED NOT DETECTED Final   Candida glabrata NOT DETECTED NOT DETECTED Final   Candida krusei NOT DETECTED NOT DETECTED Final   Candida parapsilosis NOT DETECTED NOT DETECTED Final   Candida tropicalis NOT DETECTED NOT DETECTED Final    Comment: Performed at [facility], [address] [place], [place] [address]  Culture, blood (routine x 2)     Status: None   Collection Time: 05/24/18  7:03 PM  Result  Value Ref Range Status   Specimen Description BLOOD LEFT HAND  Final   Special Requests   Final    BOTTLES DRAWN AEROBIC AND ANAEROBIC Blood Culture adequate volume   Culture   Final    NO GROWTH 5 DAYS Performed at [facility],  [address] [place], [place] [address]    Report Status 05/29/2018 FINAL  Final  MRSA PCR Screening     Status: None   Collection Time: 05/25/18  8:34 PM  Result Value Ref Range Status   MRSA by PCR NEGATIVE NEGATIVE Final    Comment:        The GeneXpert MRSA Assay (FDA approved for NASAL specimens only), is one component of a comprehensive MRSA colonization surveillance program. It is not intended to diagnose MRSA infection nor to guide or monitor treatment for MRSA infections. Performed at [facility], [address], [place], [place] [address]    Studies/Results: Dg Chest Port 1 View  Result Date: 05/29/2018 CLINICAL DATA:  Respiratory failure EXAM: PORTABLE CHEST 1 VIEW COMPARISON:  9/21/9 FINDINGS: Cardiac shadow is stable. Increasing left retrocardiac atelectatic changes are noted. Right lung remains clear. Endotracheal tube is noted in satisfactory position. No bony abnormality is seen. IMPRESSION: Increasing left retrocardiac atelectasis. Electronically Signed   By: Inez Catalina M.D.   On: 05/29/2018 07:28    Medications:  Prior to Admission:  Medications Prior to Admission  Medication Sig Dispense Refill Last Dose  . Acetaminophen 500 MG coapsule Take 500 mg by mouth as needed for pain.    unknown  . amLODipine (NORVASC) 5 MG tablet TAKE 1 TABLET BY MOUTH DAILY. (Patient taking differently: Take 5 mg by mouth daily. ) 90 tablet 1 05/24/2018 at Unknown time  . atorvastatin (LIPITOR) 40 MG tablet Take 1 tablet (40 mg total) by mouth daily. 90 tablet 1 05/22/2018 at Unknown time  . benazepril (LOTENSIN) 40 MG tablet Take 1 tablet (40 mg total) by mouth 2 (two) times daily. 180 tablet 1 05/21/2018 at Unknown time  . betamethasone dipropionate  0.05 % lotion Apply topically 2 (two) times daily. (Patient taking differently: Apply 1 application topically 2 (two) times daily as needed (for irritation). ) 120 mL 0 unknown  . colchicine 0.6 MG tablet Take 1 tablet (0.6 mg total) by mouth daily. Continue daily until you have had no pain in your foot for 2 days.  Then stop. (Patient taking differently: Take 0.6 mg by mouth daily as needed (for gout pain as directed). Continue daily until you have had no pain in your foot for 2 days.  Then stop.) 20 tablet 0 unknown  . fenofibrate 160 MG tablet TAKE 1 TABLET EVERY DAY (Patient taking differently: Take 160 mg by mouth daily. ) 90 tablet 0 05/30/2018 at Unknown time  . furosemide (LASIX) 20 MG tablet TAKE 1 TABLET TWICE DAILY (Patient taking differently: Take 20 mg by mouth 2 (two) times daily. TAKE 1 TABLET TWICE DAILY) 180 tablet 1 05/07/2018 at Unknown time  . gabapentin (NEURONTIN) 400 MG capsule TAKE 2 CAPSULES BY MOUTH 2 (TWO) TIMES DAILY. (Patient taking differently: Take 800 mg by mouth 2 (two) times daily. ) 360 capsule 0 05/15/2018 at Unknown time  . metFORMIN (GLUCOPHAGE) 500 MG tablet Take 500 mg by mouth 2 (two) times daily with a meal.    05/24/2018 at Unknown time  . potassium chloride SA (K-DUR,KLOR-CON) 20 MEQ tablet TAKE 2 TABLETS TWICE DAILY (Patient taking differently: Take 40 mEq by mouth 2 (two) times daily. ) 360 tablet 1 05/08/2018 at Unknown time  . rivaroxaban (XARELTO) 20 MG TABS tablet Take 20 mg by mouth daily with supper.    06/05/2018 at Unknown time   Scheduled: . aspirin  300 mg Rectal Daily  . atorvastatin  80 mg Per  Tube q1800  . chlorhexidine gluconate (MEDLINE KIT)  15 mL Mouth Rinse BID  . enoxaparin (LOVENOX) injection  80 mg Subcutaneous Q12H  . feeding supplement (PRO-STAT SUGAR FREE 64)  30 mL Per Tube Daily  . insulin aspart  0-15 Units Subcutaneous Q4H  . ipratropium-albuterol  3 mL Nebulization Q6H  . mouth rinse  15 mL Mouth Rinse 10 times per day  .  metoprolol tartrate  5 mg Intravenous Q6H  . sodium chloride flush  3 mL Intravenous Q12H   Continuous: . sodium chloride 10 mL/hr at 05/27/18 0500  . dextrose 5 % 1,000 mL with potassium chloride 40 mEq infusion 100 mL/hr at 05/30/18 0056  . famotidine (PEPCID) IV 20 mg (05/29/18 0922)  . feeding supplement (VITAL AF 1.2 CAL)    . piperacillin-tazobactam (ZOSYN)  IV 3.375 g (05/30/18 0410)   JFH:LKTGYB chloride, acetaminophen **OR** acetaminophen, albuterol, docusate, fentaNYL (SUBLIMAZE) injection, fentaNYL (SUBLIMAZE) injection, hydrALAZINE, midazolam, midazolam, ondansetron **OR** ondansetron (ZOFRAN) IV, polyvinyl alcohol, sodium chloride flush  Assesment: He was admitted with CVA.  He initially did well then later became increasingly somnolent and had respiratory distress and failure and ended up intubated and on mechanical ventilation.  He remains on the ventilator.  He has aspiration pneumonia.  Chest x-ray yesterday showed increasing atelectasis in today's chest x-ray is pending.  He still has low-grade fever and we do not have definitive source but he did have 1 of 2+ blood cultures.  He had poor gag reflex and difficulty swallowing but that seems better.  He was able to wean on pressure support most of the day yesterday and maintained his volumes.  He has chronic diastolic heart failure which is unchanged.  He has atrial fib unchanged  He has malnutrition and plan is for him to get PICC line and start TPN today because we have been unable to place a feeding tube. Principal Problem:   CVA (cerebral vascular accident) [facility]) Active Problems:   Essential hypertension   Peripheral neuropathy   Hyperlipidemia   Hypokalemia   Diabetes mellitus with neuropathy (HCC)   Paroxysmal atrial fibrillation (HCC)   AKI (acute kidney injury) ([place])   Chronic diastolic congestive heart failure (HCC)   Chronic anticoagulation   Iron deficiency anemia due to chronic blood loss   Acute  respiratory failure (HCC)   Aspiration pneumonia of both lower lobes due to gastric secretions (HCC)   Malnutrition of moderate degree   Pressure injury of skin   Goals of care, counseling/discussion   Palliative care by specialist   DNR (do not resuscitate) discussion    Plan: Discussed at length with 2 sons at bedside.  They asked if he has a tracheostomy if that becomes necessary if it could be removed at a later point and I told him it could.  Same with PEG tube.  At this point we will plan to start feedings let him wean again today but not plan to extubate and potential extubation in the morning once we have a better idea about what short and long-term goals are.    LOS: 7 days   Stonewall Manolis L 05/30/2018, 8:47 AM

## 2018-05-30 NOTE — Progress Notes (Signed)
PROGRESS NOTE    Keith Doyle  XKG:[id number] DOB: March 06, 1936 DOA: 06/01/2018 PCP: Janora Norlander, DO   Brief Narrative:   82 y/o male admitted to the hospital withleft sided weakness andacute right basal ganglia CVA.Shortly after admission, patient became increasingly lethargic and began having fevers. This was followed by development of respiratory distress and he became unresponsive. He was intubated for airway protection and respiratory distress.His decompensation was secondary to aspiration pneumonia. He is being treated with IV antibiotics, but has been slow to progress. Main barrier to weaning at this point has been his mental status and inability to participate in weaning. Pulmonology and neurology following. Palliative care consulted to help address goals of care.   Assessment & Plan:  Principal Problem: CVA (cerebral vascular accident) [facility]) Active Problems: Essential hypertension Peripheral neuropathy Hyperlipidemia Hypokalemia Diabetes mellitus with neuropathy (HCC) Paroxysmal atrial fibrillation (HCC) AKI (acute kidney injury) ([place]) Chronic diastolic congestive heart failure (HCC) Chronic anticoagulation Iron deficiency anemia due to chronic blood loss Acute respiratory failure (HCC) Aspiration pneumonia of both lower lobes due to gastric secretions (HCC) Malnutrition of moderate degree Pressure injury of skin   1. Acute CVA. Patient had presented with acute onset of LLE weakness. MRI brain confirmedright basal ganglia infarct. MRA head showedsome atherosclerotic disease on the left side. Carotid dopplers did not show any significant stenosis bilaterally. Echo did not show any significant findings. Seen by physical therapy who recommended SNF. He is chronically on xarelto, but this has been switched to lovenox since he did not pass his swallow evaluation. Neurology has evaluated the patient. Patient has required minimal  sedation on ventilatorandhas not beenvery responsive.Repeat CT head was unrevealing. EEG with diffuse changes and no seizure activity noted. 2. Acute respiratory failure with hypoxia. Related to aspiration pneumonia.Shortly after admission, patient became unresponsive, febrile and developed respiratory distress. He was intubated for airway protection and respiratory distress on 9/18. He was startedon intravenous antibiotics and has not had recurrent high fever spikes, but continues to have low grade temps. He has had 1 out of 2 positive blood cultures for coagulase-negative staph which is suspected to bea contaminant. Pulmonology following. Lactic acid wasinitially elevated, but has since trended down. He will need to remain on the ventilator for now. Multiple attempts have been made to pass OG to provide medications/feeding. This repeatedly gets coiled in his hiatal hernia. Plan for PICC line placement and TPN today with possible extubation tomorrow. Continue current treatments.  Repeat chest x-ray pending today. 3. Paroxysmal atrial fibrillation on chronic Xarelto. Currently on therapeutic dose Lovenox. Heart rate is currently stable on lopressor. Can resume xarelto once acute issues have resolved. 4. HLD. Will resume statin once able to take p.o. medications. LDL is above goal considering he has had a stroke. 5. Peripheral neuropathy. Gabapentin was placed on hold due to altered mental status. 6. Bilateral lower extremity edema. Mild elevation of BNP. Echo shows normal EF. Initially had good urine output with Lasix, but further diuretics were held due to increasing creatinine 7. AKI. Likely related to diuretics. Improved with IV fluids.  8. Diabetes. Metformin currently on hold. Continue sliding scale insulin. Blood sugars have been stable 9. Goals of care. Palliative care consultation pending. Patient has multiple medical problems that are affecting his quality of life. Mentally, he  has not been responding well on the ventilator and has not been able to engage in weaning process. He also has significant swallowing issues which precipitated aspiration. It would be helpful to clearly identify  goals and how patient would want to approach these difficult questions in terms of possible PEG tube. Certainly, family will have a difficult time coming to terms with this, especially since patient was so functional (independent/driving) prior to stroke.   DVT prophylaxis:Full dose Lovenox Code Status:full code Family Communication:discussed with son at the bedside Disposition Plan:Patient will remain in the ICU at this time for ventilator management, possible extubation tomorrow   Consultants:  Neurology  Pulmonology  Procedures: Echo: - Left ventricle: The cavity size was normal. Wall thickness was increased increased in a pattern of mild to moderate LVH. Systolic function was normal. The estimated ejection fraction was in the range of 60% to 65%. Wall motion was normal; there were no regional wall motion abnormalities. - Aortic valve: Mildly calcified annulus. Trileaflet. - Aortic root: The aortic root was mildly ectatic. - Mitral valve: There was trivial regurgitation. - Left atrium: The atrium was moderately dilated. - Right atrium: The atrium was mildly dilated. Central venous pressure (est): 8 mm Hg. - Atrial septum: No defect or patent foramen ovale was identified. - Tricuspid valve: There was trivial regurgitation.  - Pericardium, extracardiac: There was no pericardial effusion.  Intubation 9/18 >  EEG on 9/23  Antimicrobials:   Zosyn 9/18 > 9/19  Unasyn 9/19 > 9/23  Zosyn 9/23->  Subjective: Patient seen and evaluated today and is currently sedated on the ventilator.  Discussed at length with 2 sons at bedside regarding possible outcomes.  Objective: Vitals:   05/30/18 1000 05/30/18 1100 05/30/18 1111 05/30/18 1117  BP:  138/80 128/75    Pulse: 94 91 (!) 103   Resp: (!) 24 (!) 21 (!) 21   Temp: 99.7 F (37.6 C) 99.7 F (37.6 C) 99.7 F (37.6 C)   TempSrc:      SpO2: 99% 100% 100% 100%  Weight:      Height:        Intake/Output Summary (Last 24 hours) at 05/30/2018 1129 Last data filed at 05/30/2018 0343 Gross per 24 hour  Intake 1778.93 ml  Output 850 ml  Net 928.93 ml   Filed Weights   05/29/18 0500 05/29/18 1600 05/30/18 0500  Weight: 81.6 kg 80.5 kg 81.8 kg    Examination:  General exam: Appears calm and comfortable  Respiratory system: Clear to auscultation. Respiratory effort normal. Cardiovascular system: S1 & S2 heard, RRR. No JVD, murmurs, rubs, gallops or clicks. No pedal edema. Gastrointestinal system: Abdomen is nondistended, soft and nontender. No organomegaly or masses felt. Normal bowel sounds heard. Central nervous system: Alert and oriented. No focal neurological deficits. Extremities: Symmetric 5 x 5 power. Skin: No rashes, lesions or ulcers Psychiatry: Judgement and insight appear normal. Mood & affect appropriate.     Data Reviewed: I have personally reviewed following labs and imaging studies  CBC: Recent Labs  Lab 05/08/2018 1743  05/26/18 0358 05/27/18 0350 05/28/18 0551 05/29/18 0401 05/30/18 0337  WBC 6.5   < > 12.2* 8.6 5.9 8.5 6.4  NEUTROABS 4.3  --   --   --   --   --  4.4  HGB 10.5*   < > 10.9* 10.3* 9.5* 10.4* 9.0*  HCT 33.9*   < > 35.7* 34.3* 31.6* 34.8* 29.7*  MCV 91.4   < > 93.2 92.2 91.9 92.8 91.7  PLT 260   < > 257 255 252 235 238   < > = values in this interval not displayed.   Basic Metabolic Panel: Recent Labs  Lab 05/26/18  7416 05/26/18 1700 05/27/18 0350 05/27/18 1615 05/28/18 0551 05/29/18 0401 05/30/18 0337  NA 148*  --  146*  --  149* 149* 146*  K 3.6  --  3.4*  --  3.4* 3.5 3.9  CL 111  --  114*  --  117* 117* 117*  CO2 27  --  25  --  24 23 21*  GLUCOSE 150*  --  147*  --  139* 162* 150*  BUN 32*  --  20  --  _0 CREATININE 1.78*  --  1.12  --  0.90 0.90 0.92  CALCIUM 9.0  --  8.8*  --  9.0 9.1 8.7*  MG 2.2 2.1 2.1 2.0  --   --  2.2  PHOS 3.4 2.2* 2.5 2.6  --   --  3.3   GFR: Estimated Creatinine Clearance: 70.3 mL/min (by C-G formula based on SCr of 0.92 mg/dL). Liver Function Tests: Recent Labs  Lab 05/18/2018 1743 05/25/18 0440 05/27/18 0350 05/30/18 0337  AST _1 ALT _2 ALKPHOS 51 43 45 80  BILITOT 0.5 0.9 0.6 0.6  PROT 7.2 6.7 6.3* 5.6*  ALBUMIN 4.0 3.5 3.1* 2.5*   No results for input(s): LIPASE, AMYLASE in the last 168 hours. Recent Labs  Lab 05/24/18 1854  AMMONIA 33   Coagulation Profile: Recent Labs  Lab 05/21/2018 1743  INR 1.10   Cardiac Enzymes: Recent Labs  Lab 05/22/2018 1743  TROPONINI <0.03   BNP (last 3 results) No results for input(s): PROBNP in the last 8760 hours. HbA1C: No results for input(s): HGBA1C in the last 72 hours. CBG: Recent Labs  Lab 05/29/18 1941 05/29/18 2358 05/30/18 0340 05/30/18 0707 05/30/18 1110  GLUCAP 128* 132* 135* 143* 108*   Lipid Profile: Recent Labs    05/30/18 0337  TRIG 198*   Thyroid Function Tests: No results for input(s): TSH, T4TOTAL, FREET4, T3FREE, THYROIDAB in the last 72 hours. Anemia Panel: No results for input(s): VITAMINB12, FOLATE, FERRITIN, TIBC, IRON, RETICCTPCT in the last 72 hours. Sepsis Labs: Recent Labs  Lab 05/24/18 1854 05/24/18 2211  LATICACIDVEN 2.1* 1.5    Recent Results (from the past 240 hour(s))  Culture, blood (routine x 2)     Status: Abnormal   Collection Time: 05/24/18  6:54 PM  Result Value Ref Range Status   Specimen Description   Final    BLOOD RIGHT HAND Performed at [facility], [address] [place], [place] [address]    Special Requests   Final    BOTTLES DRAWN AEROBIC AND ANAEROBIC Blood Culture adequate volume Performed at [facility], [address] [place], [place] [address]    Culture  Setup Time   Final    GRAM POSITIVE  COCCI ANAEROBIC BOTTLE GRAM STAINED RESULT CALLED TO/READ BACK FROM MISTY SMART AT 1300  BY HFLYNT 05/25/18 CRITICAL RESULT CALLED TO, READ BACK BY AND VERIFIED WITH: RN E MURPHY [phone] 1824 MLM    Culture (A)  Final    STAPHYLOCOCCUS SPECIES (COAGULASE NEGATIVE) THE SIGNIFICANCE OF ISOLATING THIS ORGANISM FROM A SINGLE SET OF BLOOD CULTURES WHEN MULTIPLE SETS ARE DRAWN IS UNCERTAIN. PLEASE NOTIFY THE MICROBIOLOGY DEPARTMENT WITHIN ONE WEEK IF SPECIATION AND SENSITIVITIES ARE REQUIRED. Performed at [facility], [place] [address] [place], [place] [address]    Report Status 05/27/2018 FINAL  Final  Blood Culture ID Panel (Reflexed)     Status: Abnormal   Collection  Time: 05/24/18  6:54 PM  Result Value Ref Range Status   Enterococcus species NOT DETECTED NOT DETECTED Final   Listeria monocytogenes NOT DETECTED NOT DETECTED Final   Staphylococcus species DETECTED (A) NOT DETECTED Final    Comment: Methicillin (oxacillin) resistant coagulase negative staphylococcus. Possible blood culture contaminant (unless isolated from more than one blood culture draw or clinical case suggests pathogenicity). No antibiotic treatment is indicated for blood  culture contaminants. CRITICAL RESULT CALLED TO, READ BACK BY AND VERIFIED WITH: RN E MURPHY [phone] 1824 MLM    Staphylococcus aureus NOT DETECTED NOT DETECTED Final   Methicillin resistance DETECTED (A) NOT DETECTED Final    Comment: CRITICAL RESULT CALLED TO, READ BACK BY AND VERIFIED WITH: RN E MURPHY [phone] 1824 MLM    Streptococcus species NOT DETECTED NOT DETECTED Final   Streptococcus agalactiae NOT DETECTED NOT DETECTED Final   Streptococcus pneumoniae NOT DETECTED NOT DETECTED Final   Streptococcus pyogenes NOT DETECTED NOT DETECTED Final   Acinetobacter baumannii NOT DETECTED NOT DETECTED Final   Enterobacteriaceae species NOT DETECTED NOT DETECTED Final   Enterobacter cloacae complex NOT DETECTED NOT DETECTED Final   Escherichia  coli NOT DETECTED NOT DETECTED Final   Klebsiella oxytoca NOT DETECTED NOT DETECTED Final   Klebsiella pneumoniae NOT DETECTED NOT DETECTED Final   Proteus species NOT DETECTED NOT DETECTED Final   Serratia marcescens NOT DETECTED NOT DETECTED Final   Haemophilus influenzae NOT DETECTED NOT DETECTED Final   Neisseria meningitidis NOT DETECTED NOT DETECTED Final   Pseudomonas aeruginosa NOT DETECTED NOT DETECTED Final   Candida albicans NOT DETECTED NOT DETECTED Final   Candida glabrata NOT DETECTED NOT DETECTED Final   Candida krusei NOT DETECTED NOT DETECTED Final   Candida parapsilosis NOT DETECTED NOT DETECTED Final   Candida tropicalis NOT DETECTED NOT DETECTED Final    Comment: Performed at [facility], [address] [place], [place] [address]  Culture, blood (routine x 2)     Status: None   Collection Time: 05/24/18  7:03 PM  Result Value Ref Range Status   Specimen Description BLOOD LEFT HAND  Final   Special Requests   Final    BOTTLES DRAWN AEROBIC AND ANAEROBIC Blood Culture adequate volume   Culture   Final    NO GROWTH 5 DAYS Performed at [facility], [address] [place], [place] [address]    Report Status 05/29/2018 FINAL  Final  MRSA PCR Screening     Status: None   Collection Time: 05/25/18  8:34 PM  Result Value Ref Range Status   MRSA by PCR NEGATIVE NEGATIVE Final    Comment:        The GeneXpert MRSA Assay (FDA approved for NASAL specimens only), is one component of a comprehensive MRSA colonization surveillance program. It is not intended to diagnose MRSA infection nor to guide or monitor treatment for MRSA infections. Performed at [facility], [address], [place], [place] [address]          Radiology Studies: Dg Chest [facility] 1 View  Result Date: 05/29/2018 CLINICAL DATA:  Respiratory failure EXAM: PORTABLE CHEST 1 VIEW COMPARISON:  9/21/9 FINDINGS: Cardiac shadow is stable. Increasing left retrocardiac atelectatic changes are  noted. Right lung remains clear. Endotracheal tube is noted in satisfactory position. No bony abnormality is seen. IMPRESSION: Increasing left retrocardiac atelectasis. Electronically Signed   By: Inez Catalina M.D.   On: 05/29/2018 07:28        Scheduled Meds: .  aspirin  300 mg Rectal Daily  . atorvastatin  80 mg Per Tube q1800  . chlorhexidine gluconate (MEDLINE KIT)  15 mL Mouth Rinse BID  . enoxaparin (LOVENOX) injection  80 mg Subcutaneous Q12H  . feeding supplement (PRO-STAT SUGAR FREE 64)  30 mL Per Tube Daily  . insulin aspart  0-15 Units Subcutaneous Q4H  . ipratropium-albuterol  3 mL Nebulization Q6H  . mouth rinse  15 mL Mouth Rinse 10 times per day  . metoprolol tartrate  5 mg Intravenous Q6H  . sodium chloride flush  3 mL Intravenous Q12H   Continuous Infusions: . sodium chloride 10 mL/hr at 05/27/18 0500  . dextrose 5 % 1,000 mL with potassium chloride 40 mEq infusion 100 mL/hr at 05/30/18 0056  . famotidine (PEPCID) IV 20 mg (05/30/18 0955)  . feeding supplement (VITAL AF 1.2 CAL)    . piperacillin-tazobactam (ZOSYN)  IV 3.375 g (05/30/18 0410)     LOS: 7 days    Time spent: 30 minutes    Karnisha Lefebre Darleen Crocker, DO [facility] Pager [phone]  If 7PM-7AM, please contact night-coverage [email or web address] Password [email or web address] 05/30/2018, 11:29 AM

## 2018-05-30 NOTE — Progress Notes (Addendum)
Palliative: Keith Doyle is lying in bed, he is intubated/ventilated.  He does not respond in any meaningful way, or open his eyes to voice, touch.  He remains acutely/critically ill.  Present today at bedside is son Barth Kirks, and daughter [name].  We talked about PICC line today and TPN for nutrition.  Family shares that they have met with physicians this morning.  Family shares that hospitalist, Dr. Brigitte Pulse, spent an extended amount of time with them and helped clarify their father's situation.  Son Barth Kirks states that family is waiting for more information from neurologist.  He states that family feels they need more information before making any long-term decisions.  Nursing staff shares that they have contacted local neurologist for requested visit.  Clare Gandy he still me that he and family feel like they are not getting "the full pitcher".  He shares that he had heard from one person that his father did not have pneumonia, another person that he did have pneumonia, but only in one lung.  Barth Kirks states he now understands that Mr. Bufkin has bilateral pneumonia from aspiration.  I spoke at length with Barth Kirks and Ronalee Belts at yesterday's meeting related to aspiration pneumonia.  We also reviewed chest x-ray.  Daughter [name] mentions that her father is moving his feet.  I encouraged family to look for "meaningful improvements".  We talked about returning to close to previous function versus poor functional status requiring nursing home placement.  No further questions at this time.  Encouraged to reach out with any issues or concerns.  PMT will follow again tomorrow.  Conversation with hospitalist related to plan of care, goals of care meetings. Conversation with nursing staff related to plan of care, goals of care. 55 minutes Quinn Axe, NP Palliative Medicine Team Team Phone # [phone]  Greater than 50% of this time was spent counseling and coordinating care related to the above assessment and plan

## 2018-05-30 NOTE — Progress Notes (Signed)
[place] NOTE   Pharmacy Consult for TPN Indication: NPO x 1 week  Patient Measurements: Height: 6' (182.9 cm) Weight: 180 lb 5.4 oz (81.8 kg) IBW/kg (Calculated) : 77.6 TPN AdjBW (KG): 80.5 Body mass index is 24.46 kg/m.  Assessment:  admitted 05/10/2018 with left sided weakness and acute right basal ganglia CVA. Shortly after admission, patient became increasingly lethargic and started having fevers. The patient developed respiratory distress and became unresponsive; subsequently, he was intubated. He decompensated secondary to aspiration PNA. Multiple attempts have been made to pass OG to provide medications/feeding but has failed and repeatedly gets coiled in his hiatal hernia. No nutrition for 1 week, plan is to place a PICC line and start TPN GI: NPO, intubated and unable to Place feeding tube Insulin requirements in the past 24 hours: 12 units Lytes:stable. Will need to watch BS once TPN started Renal: Stable Pulm:  Intubated/ Aspiration PNA Neuro: S/P CVA ID: Tmax 101.1 WBC normalized, 1 of 2 BCX with CONS, likely contaminant.  Aspiration PNA TPN Access:  PICC line TPN start date: 05/30/2018 Nutritional Goals (per RD recommendation): consult added for dietician and notified kCal: Protein:  Fluid:  Goal TPN rate based on RD recommendations. Will initiate at 40 ml/hr  Current Nutrition: NPO  Plan:  Start TPN at 40 mL/hr. Hold 20% lipid emulsion for first 7 days for ICU patients per ASPEN guidelines  This TPN provides 48 g of protein, 144 g of dextrose which provides 682 kCals per day Electrolytes in TPN: standard Add MVI, trace elements, 0 units of regular insulin, and folic acid to TPN Low dose SSI and adjust as needed Adjust IVMF (D5W + 20meq KCL) at 60 ml/hr Monitor TPN labs  Isac Sarna, BS Vena Austria, BCPS Clinical Pharmacist Pager [phone] 05/30/2018,12:20 PM

## 2018-05-30 NOTE — Progress Notes (Signed)
Placed patient in CP/PSV at this time for weaning trial. He is getting great VTs again today with PS at 10 and PEEP at 5. RR is remaining stable. There is very little pale secretion coming from ETT. Patient appears comfortable and hemodynamically stable to leave in wean mode. RT will be checking on the patients progress throughout the day.

## 2018-05-30 NOTE — Progress Notes (Signed)
[place] A. Merlene Laughter, MD     [email or web address]          Keith Doyle is an 82 y.o. male.   Assessment/Plan:    1.  Acute encephalopathy due to respiratory failure: This is apparently due to aspiration and possible pneumonia.  The patient does have a moderate sized stroke that may cause some element of encephalopathy but the bigger picture is from his medical issues including hypernatremia, hypoxia and pneumonia.  His encephalopathy has mostly remained unchanged over the course of his hospitalization.  His overall prognosis depends on his overall medical conditions, his age and some degree his stroke.  He has had a moderate sized stroke although it is causing severe left-sided weakness.  I believe the main issues affecting his outcome this is age and medical problems.  The case is discussed at length with the daughter at the bedside.  I also did make an attempt to contact the son but he could not be reached. 2.  Acute left hemiparesis due to right basal ganglia infarct -this is undoubtedly due to atrial fibrillation: Risk factors atrial fibrillation, hypertension, dyslipidemia and previous TIA.  Patient currently is on IV heparin.  I think it may be more beneficial to place the patient on Lovenox to give him more predictable level of anticoagulation.  Short-term it is reasonable to place the patient on aspirin for couple of weeks.  Patient can restart his baseline Xarelto when appropriate.   The patient is being weaned with a possible attempt at extubation tomorrow.  He continues to be on antibiotics and has a low-grade fever with a couple of spikes of 100.4 today as the highest.   GENERAL: The patient is currently intubated.  HEENT: Neck appears to be supple.  There is a marked right exotropia.  ABDOMEN: soft  EXTREMITIES: No edema   BACK: Normal  SKIN: Normal by inspection.    MENTAL STATUS: He Eyes closed.  He does open his eyes to repeated deep painful  stimuli.  He does focuses and tracks.  He does follow midline commands consistently.  Occasionally follows commands involving the right hand.  He grimaces to deep painful stimuli.  CRANIAL NERVES: Pupils are equal, round and reactive to light; extra ocular movements are full using OCR, visual fields are full -appears full but this is limited; upper and lower facial muscles are normal in strength and symmetric, there is no flattening of the nasolabial folds.  MOTOR: There is minimal movements of the painful stimuli of the lower extremities.  The movements appears to be purposeful with some suggestion of withdrawal.  It is graded as 2/5.  The left upper extremity is 0/5.  He moves the right upper extremity against gravity 3/5.  COORDINATION: No tremors or dysmetria is appreciated.   SENSATION: He responds to painful stimuli bilaterally.    EEG IMPRESSION: 1.  This is recording shows moderate global slowing indicating moderate to global encephalopathy. 2.  There is also infrequent burst suppression pattern typically seen with potent sedation    BRAIN MRI THE BRAIN MRI AND MRA REVIEWED IN PERSON.  There is a bright signal involving the striatum on the right side.  The entire striatum is involved.  This is associated with reduced signal on the ADC scan.  There is marked global atrophy associated with ventriculomegaly.  There is moderate periventricular leukoencephalopathy.  There is incidental small  Extra-axial mass involving the right temple region most likely a meningioma.  MRA shows stenosis of  the left PCA but otherwise the MCAs are unrevealing.   Objective: Vital signs in last 24 hours: Temp:  [99.7 F (37.6 C)-101.1 F (38.4 C)] 100.4 F (38 C) (09/24 1830) Pulse Rate:  [43-112] 90 (09/24 1830) Resp:  [19-42] 30 (09/24 1830) BP: (101-150)/(58-95) 120/95 (09/24 1830) SpO2:  [97 %-100 %] 100 % (09/24 1830) FiO2 (%):  [35 %] 35 % (09/24 1645) Weight:  [81.8 kg] 81.8 kg (09/24  0500)  Intake/Output from previous day: 09/23 0701 - 09/24 0700 In: 2178.9 [I.V.:1988.8; IV Piggyback:190.2] Out: 1850 [URKYH:0623] Intake/Output this shift: No intake/output data recorded. Nutritional status:  Diet Order            Diet NPO time specified  Diet effective now               Lab Results: Results for orders placed or performed during the hospital encounter of 05/25/2018 (from the past 48 hour(s))  Glucose, capillary     Status: Abnormal   Collection Time: 05/28/18 11:49 PM  Result Value Ref Range   Glucose-Capillary 148 (H) 70 - 99 mg/dL  CBC     Status: Abnormal   Collection Time: 05/29/18  4:01 AM  Result Value Ref Range   WBC 8.5 4.0 - 10.5 K/uL   RBC 3.75 (L) 4.22 - 5.81 MIL/uL   Hemoglobin 10.4 (L) 13.0 - 17.0 g/dL   HCT 34.8 (L) 39.0 - 52.0 %   MCV 92.8 78.0 - 100.0 fL   MCH 27.7 26.0 - 34.0 pg   MCHC 29.9 (L) 30.0 - 36.0 g/dL   RDW 16.6 (H) 11.5 - 15.5 %   Platelets 235 150 - 400 K/uL    Comment: Performed at [facility], [address], [place], [place] [address]  Basic metabolic panel     Status: Abnormal   Collection Time: 05/29/18  4:01 AM  Result Value Ref Range   Sodium 149 (H) 135 - 145 mmol/L   Potassium 3.5 3.5 - 5.1 mmol/L   Chloride 117 (H) 98 - 111 mmol/L   CO2 23 22 - 32 mmol/L   Glucose, Bld 162 (H) 70 - 99 mg/dL   BUN 15 8 - 23 mg/dL   Creatinine, Ser 0.90 0.61 - 1.24 mg/dL   Calcium 9.1 8.9 - 10.3 mg/dL   GFR calc non Af Amer >60 >60 mL/min   GFR calc Af Amer >60 >60 mL/min    Comment: (NOTE) The eGFR has been calculated using the CKD EPI equation. This calculation has not been validated in all clinical situations. eGFR's persistently <60 mL/min signify possible Chronic Kidney Disease.    Anion gap 9 5 - 15    Comment: Performed at [facility], [address], [place], [place] [address]  Glucose, capillary     Status: Abnormal   Collection Time: 05/29/18  4:05 AM  Result Value Ref Range   Glucose-Capillary 140 (H) 70 -  99 mg/dL  Blood gas, arterial     Status: Abnormal   Collection Time: 05/29/18  6:07 AM  Result Value Ref Range   FIO2 35.00    Delivery systems VENTILATOR    Mode PRESSURE REGULATED VOLUME CONTROL    VT 620 mL   LHR 18 resp/min   Peep/cpap 5.0 cm H20   pH, Arterial 7.457 (H) 7.350 - 7.450   pCO2 arterial 34.2 32.0 - 48.0 mmHg   pO2, Arterial 106 83.0 - 108.0 mmHg   Bicarbonate 25.0 20.0 - 28.0 mmol/L   Acid-Base  Excess 0.4 0.0 - 2.0 mmol/L   O2 Saturation 97.9 %   Patient temperature 37.0    Collection site LEFT RADIAL    Drawn by [id number]    Sample type ARTERIAL    Allens test (pass/fail) PASS PASS    Comment: Performed at [facility], [address], [place], [place] [address]  Glucose, capillary     Status: Abnormal   Collection Time: 05/29/18  7:19 AM  Result Value Ref Range   Glucose-Capillary 134 (H) 70 - 99 mg/dL  Glucose, capillary     Status: Abnormal   Collection Time: 05/29/18 11:10 AM  Result Value Ref Range   Glucose-Capillary 140 (H) 70 - 99 mg/dL  Glucose, capillary     Status: Abnormal   Collection Time: 05/29/18  4:04 PM  Result Value Ref Range   Glucose-Capillary 135 (H) 70 - 99 mg/dL  Glucose, capillary     Status: Abnormal   Collection Time: 05/29/18  7:41 PM  Result Value Ref Range   Glucose-Capillary 128 (H) 70 - 99 mg/dL  Glucose, capillary     Status: Abnormal   Collection Time: 05/29/18 11:58 PM  Result Value Ref Range   Glucose-Capillary 132 (H) 70 - 99 mg/dL  CBC     Status: Abnormal   Collection Time: 05/30/18  3:37 AM  Result Value Ref Range   WBC 6.4 4.0 - 10.5 K/uL   RBC 3.24 (L) 4.22 - 5.81 MIL/uL   Hemoglobin 9.0 (L) 13.0 - 17.0 g/dL   HCT 29.7 (L) 39.0 - 52.0 %   MCV 91.7 78.0 - 100.0 fL   MCH 27.8 26.0 - 34.0 pg   MCHC 30.3 30.0 - 36.0 g/dL   RDW 16.7 (H) 11.5 - 15.5 %   Platelets 238 150 - 400 K/uL    Comment: Performed at [facility], [address], [place], [place] [address]  Comprehensive metabolic panel     Status:  Abnormal   Collection Time: 05/30/18  3:37 AM  Result Value Ref Range   Sodium 146 (H) 135 - 145 mmol/L   Potassium 3.9 3.5 - 5.1 mmol/L   Chloride 117 (H) 98 - 111 mmol/L   CO2 21 (L) 22 - 32 mmol/L   Glucose, Bld 150 (H) 70 - 99 mg/dL   BUN 15 8 - 23 mg/dL   Creatinine, Ser 0.92 0.61 - 1.24 mg/dL   Calcium 8.7 (L) 8.9 - 10.3 mg/dL   Total Protein 5.6 (L) 6.5 - 8.1 g/dL   Albumin 2.5 (L) 3.5 - 5.0 g/dL   AST 22 15 - 41 U/L   ALT 19 0 - 44 U/L   Alkaline Phosphatase 80 38 - 126 U/L   Total Bilirubin 0.6 0.3 - 1.2 mg/dL   GFR calc non Af Amer >60 >60 mL/min   GFR calc Af Amer >60 >60 mL/min    Comment: (NOTE) The eGFR has been calculated using the CKD EPI equation. This calculation has not been validated in all clinical situations. eGFR's persistently <60 mL/min signify possible Chronic Kidney Disease.    Anion gap 8 5 - 15    Comment: Performed at [facility], [address] [place], [place] [address]  Prealbumin     Status: Abnormal   Collection Time: 05/30/18  3:37 AM  Result Value Ref Range   Prealbumin 11.3 (L) 18 - 38 mg/dL    Comment: Performed at [facility] [address], [place], [place] [address]  Magnesium  Status: None   Collection Time: 05/30/18  3:37 AM  Result Value Ref Range   Magnesium 2.2 1.7 - 2.4 mg/dL    Comment: Performed at [facility], [address] [place], [place] [address]  Phosphorus     Status: None   Collection Time: 05/30/18  3:37 AM  Result Value Ref Range   Phosphorus 3.3 2.5 - 4.6 mg/dL    Comment: Performed at [facility], [address], [place], [place] [address]  Triglycerides     Status: Abnormal   Collection Time: 05/30/18  3:37 AM  Result Value Ref Range   Triglycerides 198 (H) <150 mg/dL    Comment: Performed at [facility], [address], [place], [place] [address]  Differential     Status: None   Collection Time: 05/30/18  3:37 AM  Result Value Ref Range   Neutrophils Relative % 67 %   Neutro Abs 4.4 1.7 -  7.7 K/uL   Lymphocytes Relative 22 %   Lymphs Abs 1.4 0.7 - 4.0 K/uL   Monocytes Relative 7 %   Monocytes Absolute 0.4 0.1 - 1.0 K/uL   Eosinophils Relative 3 %   Eosinophils Absolute 0.2 0.0 - 0.7 K/uL   Basophils Relative 1 %   Basophils Absolute 0.0 0.0 - 0.1 K/uL    Comment: Performed at [facility], [address], [place], [place] [address]  Glucose, capillary     Status: Abnormal   Collection Time: 05/30/18  3:40 AM  Result Value Ref Range   Glucose-Capillary 135 (H) 70 - 99 mg/dL  Blood gas, arterial     Status: Abnormal   Collection Time: 05/30/18  4:05 AM  Result Value Ref Range   FIO2 35.00    Delivery systems VENTILATOR    Mode PRESSURE REGULATED VOLUME CONTROL    VT 620 mL   LHR 18.0 resp/min   Peep/cpap 5.0 cm H20   pH, Arterial 7.455 (H) 7.350 - 7.450   pCO2 arterial 32.3 32.0 - 48.0 mmHg   pO2, Arterial 121.0 (H) 83.0 - 108.0 mmHg   Bicarbonate 24.0 20.0 - 28.0 mmol/L   Acid-base deficit 1.0 0.0 - 2.0 mmol/L   O2 Saturation 98.5 %   Patient temperature 37.0    Collection site RIGHT RADIAL    Drawn by [id number]    Sample type ARTERIAL    Allens test (pass/fail) PASS PASS    Comment: Performed at [facility], [address] [place], [place] [address]  Glucose, capillary     Status: Abnormal   Collection Time: 05/30/18  7:07 AM  Result Value Ref Range   Glucose-Capillary 143 (H) 70 - 99 mg/dL  Glucose, capillary     Status: Abnormal   Collection Time: 05/30/18 11:10 AM  Result Value Ref Range   Glucose-Capillary 108 (H) 70 - 99 mg/dL  Culture, blood (routine x 2)     Status: None (Preliminary result)   Collection Time: 05/30/18  3:10 PM  Result Value Ref Range   Specimen Description BLOOD LEFT ANTECUBITAL    Special Requests      BOTTLES DRAWN AEROBIC AND ANAEROBIC Blood Culture adequate volume   Culture      NO GROWTH < 12 HOURS Performed at [facility], [address], [place], [place] [address]    Report Status PENDING   Culture, blood (routine x 2)      Status: None (Preliminary result)   Collection Time: 05/30/18  3:47 PM  Result Value Ref Range  Specimen Description BLOOD LEFT FOREARM    Special Requests      BOTTLES DRAWN AEROBIC AND ANAEROBIC Blood Culture adequate volume   Culture      NO GROWTH <12 HOURS Performed at [facility], [address], [place], [place] [address]    Report Status PENDING   Glucose, capillary     Status: Abnormal   Collection Time: 05/30/18  4:17 PM  Result Value Ref Range   Glucose-Capillary 124 (H) 70 - 99 mg/dL    Lipid Panel Recent Labs    05/30/18 0337  TRIG 198*    Studies/Results:  CXR FINDINGS: Opacities at the lung bases are increasing most consistent with pneumonia with possible some atelectasis. No definite effusion is seen. The tip of the endotracheal tube is approximately 5.8 cm above the carina. Mediastinal and hilar contours are unremarkable and the descending thoracic aorta is ectatic. The heart is within normal limits in size. As noted previously the NG tube may be coiling within a hiatal hernia. Abdominal films would be helpful to assess further.  IMPRESSION: 1. Increasing bibasilar opacities most consistent with pneumonia. Consider follow-up. 2. NG tube appears to be coiling within a hiatal hernia. Abdominal films would be helpful. 3. Tip of endotracheal tube is approximately 5.8 cm above the Carina.         Medications:  Scheduled Meds: . aspirin  300 mg Rectal Daily  . atorvastatin  80 mg Per Tube q1800  . chlorhexidine gluconate (MEDLINE KIT)  15 mL Mouth Rinse BID  . Chlorhexidine Gluconate Cloth  6 each Topical Daily  . Chlorhexidine Gluconate Cloth  6 each Topical Daily  . enoxaparin (LOVENOX) injection  80 mg Subcutaneous Q12H  . feeding supplement (PRO-STAT SUGAR FREE 64)  30 mL Per Tube Daily  . insulin aspart  0-15 Units Subcutaneous Q4H  . ipratropium-albuterol  3 mL Nebulization Q6H  . mouth rinse  15 mL Mouth Rinse 10 times per day  .  metoprolol tartrate  5 mg Intravenous Q6H  . sodium chloride flush  10-40 mL Intracatheter Q12H  . sodium chloride flush  3 mL Intravenous Q12H   Continuous Infusions: . [place] KitchenTPN (CLINIMIX-E) Adult 40 mL/hr at 05/30/18 1802  . sodium chloride 10 mL/hr at 05/27/18 0500  . dextrose 5 % 1,000 mL with potassium chloride 40 mEq infusion 60 mL/hr at 05/30/18 1802  . famotidine (PEPCID) IV Stopped (05/30/18 1025)  . feeding supplement (VITAL AF 1.2 CAL)    . piperacillin-tazobactam (ZOSYN)  IV 3.375 g (05/30/18 1943)   PRN Meds:.sodium chloride, acetaminophen **OR** acetaminophen, albuterol, docusate, fentaNYL (SUBLIMAZE) injection, fentaNYL (SUBLIMAZE) injection, hydrALAZINE, midazolam, midazolam, ondansetron **OR** ondansetron (ZOFRAN) IV, polyvinyl alcohol, sodium chloride flush, sodium chloride flush, sodium chloride flush     LOS: 7 days   Arber Wiemers A. Merlene Laughter, M.D.  Diplomate, Tax adviser of Psychiatry and Neurology ( Neurology).

## 2018-05-30 NOTE — Care Management Note (Signed)
Case Management Note  Patient Details  Name: Keith Doyle MRN: [id number] Date of Birth: Apr 06, 1936  If discussed at [place] Length of Stay Meetings, dates discussed:  05/30/2018  Additional Comments:  Jenisha Faison, Chauncey Reading, RN 05/30/2018, 12:28 PM

## 2018-05-30 NOTE — Progress Notes (Signed)
Acknowledge Consult for new TPN.   Spoke with PharmD. After PICC placed, to begin TPN standard initial rate of 40 cc/hr. Will conduct full follow up assessment with updated kcal/pro needs tomorrow morning.   Burtis Junes RD, LDN, CNSC Clinical Nutrition Available Tues-Sat via Pager: [phone] 05/30/2018 4:03 PM

## 2018-05-30 NOTE — Progress Notes (Signed)
Peripherally Inserted Central Catheter/Midline Placement  The IV Nurse has discussed with the patient and/or persons authorized to consent for the patient, the purpose of this procedure and the potential benefits and risks involved with this procedure.  The benefits include less needle sticks, lab draws from the catheter, and the patient may be discharged home with the catheter. Risks include, but not limited to, infection, bleeding, blood clot (thrombus formation), and puncture of an artery; nerve damage and irregular heartbeat and possibility to perform a PICC exchange if needed/ordered by physician.  Alternatives to this procedure were also discussed.  B[employer] Power PICC patient education guide, fact sheet on infection prevention and patient information card has been provided to patient /or left at bedside.    PICC/Midline Placement Documentation  PICC Double Lumen 05/30/18 PICC Right Cephalic 37 cm 0 cm (Active)  Indication for Insertion or Continuance of Line Administration of hyperosmolar/irritating solutions (i.e. TPN, Vancomycin, etc.) 05/30/2018  2:00 PM  Exposed Catheter (cm) 0 cm 05/30/2018  2:00 PM  Site Assessment Clean;Dry;Intact 05/30/2018  2:00 PM  Lumen #1 Status Flushed;Blood return noted;Saline locked 05/30/2018  2:00 PM  Lumen #2 Status Flushed;Blood return noted;Saline locked 05/30/2018  2:00 PM  Dressing Type Transparent 05/30/2018  2:00 PM  Dressing Status Clean;Dry;Intact 05/30/2018  2:00 PM  Dressing Change Due 2018-06-07 05/30/2018  2:00 PM       [address] 05/30/2018, 2:20 PM

## 2018-05-31 ENCOUNTER — Inpatient Hospital Stay (HOSPITAL_COMMUNITY): Payer: Medicare HMO

## 2018-05-31 DIAGNOSIS — E43 Unspecified severe protein-calorie malnutrition: Secondary | ICD-10-CM | POA: Diagnosis present

## 2018-05-31 LAB — BLOOD GAS, ARTERIAL
Acid-base deficit: 1.2 mmol/L (ref 0.0–2.0)
Acid-base deficit: 0.8 mmol/L (ref 0.0–2.0)
Bicarbonate: 24.1 mmol/L (ref 20.0–28.0)
Bicarbonate: 24.3 mmol/L (ref 20.0–28.0)
DRAWN BY: 21310
DRAWN BY: 234301
FIO2: 30
FIO2: 40
MECHVT: 620 mL
MODE: POSITIVE
O2 Saturation: 97.9 %
O2 Saturation: 99.2 %
PCO2 ART: 33.2 mmHg (ref 32.0–48.0)
PEEP/CPAP: 5 cmH2O
PEEP: 5 cmH2O
Patient temperature: 38
Pressure support: 5 cmH2O
RATE: 18 resp/min
pCO2 arterial: 33 mmHg (ref 32.0–48.0)
pH, Arterial: 7.447 (ref 7.350–7.450)
pH, Arterial: 7.449 (ref 7.350–7.450)
pO2, Arterial: 111 mmHg — ABNORMAL HIGH (ref 83.0–108.0)
pO2, Arterial: 157 mmHg — ABNORMAL HIGH (ref 83.0–108.0)

## 2018-05-31 LAB — BASIC METABOLIC PANEL
ANION GAP: 9 (ref 5–15)
BUN: 17 mg/dL (ref 8–23)
CHLORIDE: 115 mmol/L — AB (ref 98–111)
CO2: 22 mmol/L (ref 22–32)
Calcium: 9.1 mg/dL (ref 8.9–10.3)
Creatinine, Ser: 0.94 mg/dL (ref 0.61–1.24)
GFR calc Af Amer: >60 mL/min (ref >60)
GFR calc non Af Amer: >60 mL/min (ref >60)
GLUCOSE: 160 mg/dL — AB (ref 70–99)
POTASSIUM: 3.8 mmol/L (ref 3.5–5.1)
SODIUM: 146 mmol/L — AB (ref 135–145)

## 2018-05-31 LAB — GLUCOSE, CAPILLARY
GLUCOSE-CAPILLARY: 138 mg/dL — AB (ref 70–99)
GLUCOSE-CAPILLARY: 147 mg/dL — AB (ref 70–99)
GLUCOSE-CAPILLARY: 156 mg/dL — AB (ref 70–99)
Glucose-Capillary: 156 mg/dL — ABNORMAL HIGH (ref 70–99)
Glucose-Capillary: 153 mg/dL — ABNORMAL HIGH (ref 70–99)
Glucose-Capillary: 140 mg/dL — ABNORMAL HIGH (ref 70–99)

## 2018-05-31 LAB — CBC
HEMATOCRIT: 30.5 % — AB (ref 39.0–52.0)
Hemoglobin: 9.3 g/dL — ABNORMAL LOW (ref 13.0–17.0)
MCH: 28.1 pg (ref 26.0–34.0)
MCHC: 30.5 g/dL (ref 30.0–36.0)
MCV: 92.1 fL (ref 78.0–100.0)
PLATELETS: 264 10*3/uL (ref 150–400)
RBC: 3.31 MIL/uL — AB (ref 4.22–5.81)
RDW: 16.5 % — AB (ref 11.5–15.5)
WBC: 7.3 10*3/uL (ref 4.0–10.5)

## 2018-05-31 MED ORDER — TRACE MINERALS CR-CU-MN-SE-ZN 10-1000-500-60 MCG/ML IV SOLN
INTRAVENOUS | Status: AC
  Administered 2018-05-31: 18:00:00 via INTRAVENOUS
  Filled 2018-05-31: qty 1920

## 2018-05-31 MED ORDER — POTASSIUM CHLORIDE 2 MEQ/ML IV SOLN
INTRAVENOUS | Status: DC
  Administered 2018-05-31 – 2018-06-05 (×3): via INTRAVENOUS
  Filled 2018-05-31 (×4): qty 1000

## 2018-05-31 NOTE — Progress Notes (Signed)
PROGRESS NOTE    Keith Doyle  HCW:[id number] DOB: 06-May-1936 DOA: 05/15/2018 PCP: Janora Norlander, DO   Brief Narrative:   82 y/o male admitted to the hospital withleft sided weakness andacute right basal ganglia CVA.Shortly after admission, patient became increasingly lethargic and began having fevers. This was followed by development of respiratory distress and he became unresponsive. He was intubated for airway protection and respiratory distress.His decompensation was secondary to aspiration pneumonia. He is being treated with IV antibiotics, but has been slow to progress. Main barrier to weaning at this point has been his mental status and inability to participate in weaning. Pulmonology and neurology following.Palliative careconsultedto help address goals of care.   Assessment & Plan:  Principal Problem: CVA (cerebral vascular accident) [facility]) Active Problems: Essential hypertension Peripheral neuropathy Hyperlipidemia Hypokalemia Diabetes mellitus with neuropathy (HCC) Paroxysmal atrial fibrillation (HCC) AKI (acute kidney injury) ([place]) Chronic diastolic congestive heart failure (HCC) Chronic anticoagulation Iron deficiency anemia due to chronic blood loss Acute respiratory failure (HCC) Aspiration pneumonia of both lower lobes due to gastric secretions (HCC) Malnutrition of moderate degree Pressure injury of skin   1. Acute CVA. Patient had presented with acute onset of LLE weakness. MRI brain confirmedright basal ganglia infarct. MRA head showedsome atherosclerotic disease on the left side. Carotid dopplers did not show any significant stenosis bilaterally. Echo did not show any significant findings. Seen by physical therapy who recommended SNF. He is chronically on xarelto, but this has been switched to lovenox since he did not pass his swallow evaluation. Neurology has evaluated the patient. Patient has required minimal  sedation on ventilatorandhas not beenvery responsive.Repeat CT head was unrevealing. EEG with diffuse changes and no seizure activity noted. 2. Acute respiratory failure with hypoxia. Related to aspiration pneumonia.Shortly after admission, patient became unresponsive, febrile and developed respiratory distress. He was intubated for airway protection and respiratory distress on 9/18. He was startedon intravenous antibiotics and has not had recurrent high fever spikes, but continues to have low grade temps. He has had 1 out of 2 positive blood cultures for coagulase-negative staph which is suspected to bea contaminant. Pulmonology following. Lactic acid wasinitially elevated, but has since trended down. He will need to remain on the ventilator for now. Multiple attempts have been made to pass OG to provide medications/feeding. This repeatedly gets coiled in his hiatal hernia. Plan for PICC line placement and TPN today with possible extubation tomorrow. Continue current treatments.  Repeat chest x-ray pending today with 9/24 xray showing improvement. Plans for possible extubation soon per Pulmonology. 3. Paroxysmal atrial fibrillation on chronic Xarelto. Currently on therapeutic dose Lovenox. Heart rate is currently stable on lopressor. Can resume xarelto once acute issues have resolved. 4. HLD. Will resume statin once able to take p.o. medications. LDL is above goal considering he has had a stroke. 5. Peripheral neuropathy. Gabapentin was placed on hold due to altered mental status. 6. Bilateral lower extremity edema. Mild elevation of BNP. Echo shows normal EF. Initially had good urine output with Lasix, but further diuretics were held due to increasing creatinine 7. AKI. Likely related to diuretics. Improved with IV fluids.  8. Diabetes. Metformin currently on hold. Continue sliding scale insulin. Blood sugars have been stable 9. Goals of care.Palliative care consultationpending.  Patient has multiple medical problems that are affecting his quality of life. Mentally, he has not been responding well on the ventilator and has not been able to engage in weaning process. He also has significant swallowing issues which precipitated aspiration.  It would be helpful to clearly identify goals and how patient would want to approach these difficult questions in terms of possible PEG tube. Certainly, family will have a difficult time coming to terms with this, especially since patient was so functional (independent/driving) prior to stroke.   DVT prophylaxis:Full dose Lovenox Code Status:full code Family Communication:discussed withsonat the bedside Disposition Plan:Patient will remain in the ICU at this time for ventilator management, possible extubation later today.   Consultants:  Neurology  Pulmonology  Procedures: Echo: - Left ventricle: The cavity size was normal. Wall thickness was increased increased in a pattern of mild to moderate LVH. Systolic function was normal. The estimated ejection fraction was in the range of 60% to 65%. Wall motion was normal; there were no regional wall motion abnormalities. - Aortic valve: Mildly calcified annulus. Trileaflet. - Aortic root: The aortic root was mildly ectatic. - Mitral valve: There was trivial regurgitation. - Left atrium: The atrium was moderately dilated. - Right atrium: The atrium was mildly dilated. Central venous pressure (est): 8 mm Hg. - Atrial septum: No defect or patent foramen ovale was identified. - Tricuspid valve: There was trivial regurgitation.  - Pericardium, extracardiac: There was no pericardial effusion.  Intubation 9/18 >  EEG on 9/23  Antimicrobials:   Zosyn 9/18 > 9/19  Unasyn 9/19 >9/23  Zosyn 9/23->  Subjective: Patient seen and evaluated today with no new acute complaints or concerns overnight. He still maintains a fever. Son and nephew at bedside, unsure  about re-intubation should that be necessary.  Objective: Vitals:   05/31/18 0700 05/31/18 0800 05/31/18 0856 05/31/18 0900  BP: 136/85 133/85  128/78  Pulse: 92 87  95  Resp: (!) 23 20  (!) 25  Temp: 100.2 F (37.9 C) (!) 100.4 F (38 C)  100.2 F (37.9 C)  TempSrc:      SpO2: 100% 100% 98% 96%  Weight:      Height:    6' (1.829 m)    Intake/Output Summary (Last 24 hours) at 05/31/2018 1041 Last data filed at 05/31/2018 0657 Gross per 24 hour  Intake 2339.52 ml  Output 2250 ml  Net 89.52 ml   Filed Weights   05/29/18 1600 05/30/18 0500 05/31/18 0435  Weight: 80.5 kg 81.8 kg 81.2 kg    Examination:  General exam: Sedated on vent Respiratory system: Clear to auscultation. Respiratory effort normal. Vent 35% Cardiovascular system: S1 & S2 heard, RRR. No JVD, murmurs, rubs, gallops or clicks. No pedal edema. Gastrointestinal system: Abdomen is nondistended, soft and nontender. No organomegaly or masses felt. Normal bowel sounds heard. Central nervous system: Cannot be assessed. Extremities: Symmetric 5 x 5 power. Skin: No rashes, lesions or ulcers Psychiatry: Cannot be assessed.    Data Reviewed: I have personally reviewed following labs and imaging studies  CBC: Recent Labs  Lab 05/27/18 0350 05/28/18 0551 05/29/18 0401 05/30/18 0337 05/31/18 0401  WBC 8.6 5.9 8.5 6.4 7.3  NEUTROABS  --   --   --  4.4  --   HGB 10.3* 9.5* 10.4* 9.0* 9.3*  HCT 34.3* 31.6* 34.8* 29.7* 30.5*  MCV 92.2 91.9 92.8 91.7 92.1  PLT 255 252 235 238 258   Basic Metabolic Panel: Recent Labs  Lab 05/26/18 0358 05/26/18 1700 05/27/18 0350 05/27/18 1615 05/28/18 0551 05/29/18 0401 05/30/18 0337 05/31/18 0401  NA 148*  --  146*  --  149* 149* 146* 146*  K 3.6  --  3.4*  --  3.4* 3.5 3.9  3.8  CL 111  --  114*  --  117* 117* 117* 115*  CO2 27  --  25  --  24 23 21* 22  GLUCOSE 150*  --  147*  --  139* 162* 150* 160*  BUN 32*  --  20  --  '17 15 15 17  ' CREATININE 1.78*  --  1.12   --  0.90 0.90 0.92 0.94  CALCIUM 9.0  --  8.8*  --  9.0 9.1 8.7* 9.1  MG 2.2 2.1 2.1 2.0  --   --  2.2  --   PHOS 3.4 2.2* 2.5 2.6  --   --  3.3  --    GFR: Estimated Creatinine Clearance: 68.8 mL/min (by C-G formula based on SCr of 0.94 mg/dL). Liver Function Tests: Recent Labs  Lab 05/25/18 0440 05/27/18 0350 05/30/18 0337  AST '16 18 22  ' ALT '16 13 19  ' ALKPHOS 43 45 80  BILITOT 0.9 0.6 0.6  PROT 6.7 6.3* 5.6*  ALBUMIN 3.5 3.1* 2.5*   No results for input(s): LIPASE, AMYLASE in the last 168 hours. Recent Labs  Lab 05/24/18 1854  AMMONIA 33   Coagulation Profile: No results for input(s): INR, PROTIME in the last 168 hours. Cardiac Enzymes: No results for input(s): CKTOTAL, CKMB, CKMBINDEX, TROPONINI in the last 168 hours. BNP (last 3 results) No results for input(s): PROBNP in the last 8760 hours. HbA1C: No results for input(s): HGBA1C in the last 72 hours. CBG: Recent Labs  Lab 05/30/18 1617 05/30/18 1949 05/31/18 0023 05/31/18 0427 05/31/18 0727  GLUCAP 124* 144* 156* 147* 156*   Lipid Profile: Recent Labs    05/30/18 0337  TRIG 198*   Thyroid Function Tests: No results for input(s): TSH, T4TOTAL, FREET4, T3FREE, THYROIDAB in the last 72 hours. Anemia Panel: No results for input(s): VITAMINB12, FOLATE, FERRITIN, TIBC, IRON, RETICCTPCT in the last 72 hours. Sepsis Labs: Recent Labs  Lab 05/24/18 1854 05/24/18 2211  LATICACIDVEN 2.1* 1.5    Recent Results (from the past 240 hour(s))  Culture, blood (routine x 2)     Status: Abnormal   Collection Time: 05/24/18  6:54 PM  Result Value Ref Range Status   Specimen Description   Final    BLOOD RIGHT HAND Performed at [facility], [address], [place], [place] [address]    Special Requests   Final    BOTTLES DRAWN AEROBIC AND ANAEROBIC Blood Culture adequate volume Performed at [facility], [address] [place], [place] [address]    Culture  Setup Time   Final    GRAM POSITIVE COCCI ANAEROBIC  BOTTLE GRAM STAINED RESULT CALLED TO/READ BACK FROM MISTY SMART AT 1300  BY HFLYNT 05/25/18 CRITICAL RESULT CALLED TO, READ BACK BY AND VERIFIED WITH: RN E MURPHY [phone] [id number] MLM    Culture (A)  Final    STAPHYLOCOCCUS SPECIES (COAGULASE NEGATIVE) THE SIGNIFICANCE OF ISOLATING THIS ORGANISM FROM A SINGLE SET OF BLOOD CULTURES WHEN MULTIPLE SETS ARE DRAWN IS UNCERTAIN. PLEASE NOTIFY THE MICROBIOLOGY DEPARTMENT WITHIN ONE WEEK IF SPECIATION AND SENSITIVITIES ARE REQUIRED. Performed at [facility], [place] [address] [place], [place] [address]    Report Status 05/27/2018 FINAL  Final  Blood Culture ID Panel (Reflexed)     Status: Abnormal   Collection Time: 05/24/18  6:54 PM  Result Value Ref Range Status   Enterococcus species NOT DETECTED NOT DETECTED Final   Listeria monocytogenes NOT DETECTED NOT DETECTED Final   Staphylococcus species  DETECTED (A) NOT DETECTED Final    Comment: Methicillin (oxacillin) resistant coagulase negative staphylococcus. Possible blood culture contaminant (unless isolated from more than one blood culture draw or clinical case suggests pathogenicity). No antibiotic treatment is indicated for blood  culture contaminants. CRITICAL RESULT CALLED TO, READ BACK BY AND VERIFIED WITH: RN E MURPHY [phone] 1824 MLM    Staphylococcus aureus NOT DETECTED NOT DETECTED Final   Methicillin resistance DETECTED (A) NOT DETECTED Final    Comment: CRITICAL RESULT CALLED TO, READ BACK BY AND VERIFIED WITH: RN E MURPHY [phone] 1824 MLM    Streptococcus species NOT DETECTED NOT DETECTED Final   Streptococcus agalactiae NOT DETECTED NOT DETECTED Final   Streptococcus pneumoniae NOT DETECTED NOT DETECTED Final   Streptococcus pyogenes NOT DETECTED NOT DETECTED Final   Acinetobacter baumannii NOT DETECTED NOT DETECTED Final   Enterobacteriaceae species NOT DETECTED NOT DETECTED Final   Enterobacter cloacae complex NOT DETECTED NOT DETECTED Final   Escherichia coli NOT DETECTED  NOT DETECTED Final   Klebsiella oxytoca NOT DETECTED NOT DETECTED Final   Klebsiella pneumoniae NOT DETECTED NOT DETECTED Final   Proteus species NOT DETECTED NOT DETECTED Final   Serratia marcescens NOT DETECTED NOT DETECTED Final   Haemophilus influenzae NOT DETECTED NOT DETECTED Final   Neisseria meningitidis NOT DETECTED NOT DETECTED Final   Pseudomonas aeruginosa NOT DETECTED NOT DETECTED Final   Candida albicans NOT DETECTED NOT DETECTED Final   Candida glabrata NOT DETECTED NOT DETECTED Final   Candida krusei NOT DETECTED NOT DETECTED Final   Candida parapsilosis NOT DETECTED NOT DETECTED Final   Candida tropicalis NOT DETECTED NOT DETECTED Final    Comment: Performed at [facility], [address] [place], [place] [address]  Culture, blood (routine x 2)     Status: None   Collection Time: 05/24/18  7:03 PM  Result Value Ref Range Status   Specimen Description BLOOD LEFT HAND  Final   Special Requests   Final    BOTTLES DRAWN AEROBIC AND ANAEROBIC Blood Culture adequate volume   Culture   Final    NO GROWTH 5 DAYS Performed at [facility], [address] [place], [place] [address]    Report Status 05/29/2018 FINAL  Final  MRSA PCR Screening     Status: None   Collection Time: 05/25/18  8:34 PM  Result Value Ref Range Status   MRSA by PCR NEGATIVE NEGATIVE Final    Comment:        The GeneXpert MRSA Assay (FDA approved for NASAL specimens only), is one component of a comprehensive MRSA colonization surveillance program. It is not intended to diagnose MRSA infection nor to guide or monitor treatment for MRSA infections. Performed at [facility], [address] [place], [place] [address]   Culture, blood (routine x 2)     Status: None (Preliminary result)   Collection Time: 05/30/18  3:10 PM  Result Value Ref Range Status   Specimen Description BLOOD LEFT ANTECUBITAL  Final   Special Requests   Final    BOTTLES DRAWN AEROBIC AND ANAEROBIC Blood Culture  adequate volume   Culture   Final    NO GROWTH < 24 HOURS Performed at [facility], [address], [place],  [address]    Report Status PENDING  Incomplete  Culture, blood (routine x 2)     Status: None (Preliminary result)   Collection Time: 05/30/18  3:47 PM  Result Value Ref Range Status   Specimen Description BLOOD  LEFT FOREARM  Final   Special Requests   Final    BOTTLES DRAWN AEROBIC AND ANAEROBIC Blood Culture adequate volume   Culture   Final    NO GROWTH < 24 HOURS Performed at [facility], [address] [place], [place] [address]    Report Status PENDING  Incomplete         Radiology Studies: Dg Chest Port 1 View  Result Date: 05/30/2018 CLINICAL DATA:  Status post PICC line placement. EXAM: PORTABLE CHEST 1 VIEW COMPARISON:  Portable chest x-ray of May 29, 2018 FINDINGS: The right-sided PICC line has its tip terminating in the region of the midportion of the SVC. No immediate postprocedure complication is observed. The lungs are adequately inflated. The interstitial markings remain coarse. There is hazy increased density at the left lung base which is not new and has in fact improved since yesterday's study. The heart and pulmonary vascularity are normal. The endotracheal tube tip projects approximately 5.2 cm above the carina. IMPRESSION: Good positioning of the right PICC line with the tip projecting over the midportion of the SVC. Slight improvement in the appearance of left basilar atelectasis or pneumonia. Electronically Signed   By: David  Martinique M.D.   On: 05/30/2018 15:01        Scheduled Meds: . aspirin  300 mg Rectal Daily  . atorvastatin  80 mg Per Tube q1800  . chlorhexidine gluconate (MEDLINE KIT)  15 mL Mouth Rinse BID  . Chlorhexidine Gluconate Cloth  6 each Topical Daily  . Chlorhexidine Gluconate Cloth  6 each Topical Daily  . enoxaparin (LOVENOX) injection  80 mg Subcutaneous Q12H  . insulin aspart  0-15 Units Subcutaneous Q4H  .  ipratropium-albuterol  3 mL Nebulization Q6H  . mouth rinse  15 mL Mouth Rinse 10 times per day  . metoprolol tartrate  5 mg Intravenous Q6H  . sodium chloride flush  10-40 mL Intracatheter Q12H  . sodium chloride flush  3 mL Intravenous Q12H   Continuous Infusions: . Marland KitchenTPN (CLINIMIX-E) Adult 40 mL/hr at 05/31/18 0657  . sodium chloride 10 mL/hr at 05/27/18 0500  . dextrose 5 % 1,000 mL with potassium chloride 40 mEq infusion 60 mL/hr at 05/31/18 1029  . famotidine (PEPCID) IV 20 mg (05/31/18 1027)  . piperacillin-tazobactam (ZOSYN)  IV 12.5 mL/hr at 05/31/18 0657     LOS: 8 days    Time spent: 30 minutes    Ayan Heffington Darleen Crocker, DO [facility] Pager [phone]  If 7PM-7AM, please contact night-coverage [email or web address] Password [facility] 05/31/2018, 10:41 AM

## 2018-05-31 NOTE — Progress Notes (Signed)
Foley A. Merlene Laughter, MD     [email or web address]          Keith Doyle is an 82 y.o. male.   Assessment/Plan:    1.  Acute encephalopathy due to respiratory failure: This is apparently due to aspiration and possible pneumonia.  The patient does have a moderate sized stroke that may cause some element of encephalopathy but the bigger picture is from his medical issues including hypernatremia, hypoxia and pneumonia.  His encephalopathy has mostly remained unchanged over the course of his hospitalization.  His overall prognosis depends on his overall medical conditions, his age and some degree his stroke.  He has had a moderate sized stroke although it is causing severe left-sided weakness.  I believe the main issues affecting his outcome this is age and medical problems.  The case is discussed at length with the daughter at the bedside.  I also did make an attempt to contact the son but he could not be reached.   2.  Acute left hemiparesis due to right basal ganglia infarct -this is undoubtedly due to atrial fibrillation: Risk factors atrial fibrillation, hypertension, dyslipidemia and previous TIA.  Patient currently is on IV heparin.  I think it may be more beneficial to place the patient on Lovenox to give him more predictable level of anticoagulation.  Short-term it is reasonable to place the patient on aspirin for couple of weeks.  Patient can restart his baseline Xarelto when appropriate.     Family reports that he has been awake all day and more responsive.  He did not meet parameters for extubation and is therefore still intubated.    GENERAL: The patient is currently intubated.  HEENT: Neck appears to be supple.  There is a marked right exotropia.   EXTREMITIES: No edema of the extremities especially the right upper extremity.  BACK: Normal  SKIN: Normal by inspection.    MENTAL STATUS:  He is awake and alert.  He focuses and tracks.  He briskly  follow midline commands and with prompting also follow commands involving the right upper extremity.  CRANIAL NERVES: Pupils are equal, round and reactive to light; extra ocular movements are full using OCR, visual fields are full -appears full but this is limited; upper and lower facial muscles are normal in strength and symmetric, there is no flattening of the nasolabial folds.  MOTOR: There is minimal movements of the painful stimuli of the lower extremities.  The movements appears to be purposeful with some suggestion of withdrawal.  It is graded as 2/5.  The left upper extremity is 0/5.  He moves the right upper extremity against gravity 3/5.  COORDINATION: No tremors or dysmetria is appreciated.   SENSATION: He responds to painful stimuli bilaterally.    EEG IMPRESSION: 1.  This is recording shows moderate global slowing indicating moderate to global encephalopathy. 2.  There is also infrequent burst suppression pattern typically seen with potent sedation    BRAIN MRI THE BRAIN MRI AND MRA REVIEWED IN PERSON.  There is a bright signal involving the striatum on the right side.  The entire striatum is involved.  This is associated with reduced signal on the ADC scan.  There is marked global atrophy associated with ventriculomegaly.  There is moderate periventricular leukoencephalopathy.  There is incidental small  Extra-axial mass involving the right temple region most likely a meningioma.  MRA shows stenosis of the left PCA but otherwise the MCAs are unrevealing.   Objective: Vital signs  in last 24 hours: Temp:  [100.2 F (37.9 C)-100.8 F (38.2 C)] 100.2 F (37.9 C) (09/25 1400) Pulse Rate:  [44-130] 93 (09/25 1400) Resp:  [14-28] 14 (09/25 1400) BP: (118-159)/(63-113) 150/84 (09/25 1400) SpO2:  [96 %-100 %] 99 % (09/25 1442) FiO2 (%):  [30 %-40 %] 40 % (09/25 1524) Weight:  [81.2 kg] 81.2 kg (09/25 0435)  Intake/Output from previous day: 09/24 0701 - 09/25 0700 In:  2339.5 [I.V.:2171.4; IV Piggyback:168.2] Out: 2250 [Urine:2250] Intake/Output this shift: No intake/output data recorded. Nutritional status:  Diet Order            Diet NPO time specified  Diet effective now               Lab Results: Results for orders placed or performed during the hospital encounter of 06/03/2018 (from the past 48 hour(s))  Glucose, capillary     Status: Abnormal   Collection Time: 05/29/18 11:58 PM  Result Value Ref Range   Glucose-Capillary 132 (H) 70 - 99 mg/dL  CBC     Status: Abnormal   Collection Time: 05/30/18  3:37 AM  Result Value Ref Range   WBC 6.4 4.0 - 10.5 K/uL   RBC 3.24 (L) 4.22 - 5.81 MIL/uL   Hemoglobin 9.0 (L) 13.0 - 17.0 g/dL   HCT 29.7 (L) 39.0 - 52.0 %   MCV 91.7 78.0 - 100.0 fL   MCH 27.8 26.0 - 34.0 pg   MCHC 30.3 30.0 - 36.0 g/dL   RDW 16.7 (H) 11.5 - 15.5 %   Platelets 238 150 - 400 K/uL    Comment: Performed at [facility], [address] [place], [place] [address]  Comprehensive metabolic panel     Status: Abnormal   Collection Time: 05/30/18  3:37 AM  Result Value Ref Range   Sodium 146 (H) 135 - 145 mmol/L   Potassium 3.9 3.5 - 5.1 mmol/L   Chloride 117 (H) 98 - 111 mmol/L   CO2 21 (L) 22 - 32 mmol/L   Glucose, Bld 150 (H) 70 - 99 mg/dL   BUN 15 8 - 23 mg/dL   Creatinine, Ser 0.92 0.61 - 1.24 mg/dL   Calcium 8.7 (L) 8.9 - 10.3 mg/dL   Total Protein 5.6 (L) 6.5 - 8.1 g/dL   Albumin 2.5 (L) 3.5 - 5.0 g/dL   AST 22 15 - 41 U/L   ALT 19 0 - 44 U/L   Alkaline Phosphatase 80 38 - 126 U/L   Total Bilirubin 0.6 0.3 - 1.2 mg/dL   GFR calc non Af Amer >60 >60 mL/min   GFR calc Af Amer >60 >60 mL/min    Comment: (NOTE) The eGFR has been calculated using the CKD EPI equation. This calculation has not been validated in all clinical situations. eGFR's persistently <60 mL/min signify possible Chronic Kidney Disease.    Anion gap 8 5 - 15    Comment: Performed at [facility], [address] [place], [place] [address]    Prealbumin     Status: Abnormal   Collection Time: 05/30/18  3:37 AM  Result Value Ref Range   Prealbumin 11.3 (L) 18 - 38 mg/dL    Comment: Performed at [facility] [address] [place], [place] [address]  Magnesium     Status: None   Collection Time: 05/30/18  3:37 AM  Result Value Ref Range   Magnesium 2.2 1.7 - 2.4 mg/dL    Comment: Performed at [facility],  [address], [place], [place] [address]  Phosphorus     Status: None   Collection Time: 05/30/18  3:37 AM  Result Value Ref Range   Phosphorus 3.3 2.5 - 4.6 mg/dL    Comment: Performed at [facility], [address], [place], [place] [address]  Triglycerides     Status: Abnormal   Collection Time: 05/30/18  3:37 AM  Result Value Ref Range   Triglycerides 198 (H) <150 mg/dL    Comment: Performed at [facility], [address] [place], [place] [address]  Differential     Status: None   Collection Time: 05/30/18  3:37 AM  Result Value Ref Range   Neutrophils Relative % 67 %   Neutro Abs 4.4 1.7 - 7.7 K/uL   Lymphocytes Relative 22 %   Lymphs Abs 1.4 0.7 - 4.0 K/uL   Monocytes Relative 7 %   Monocytes Absolute 0.4 0.1 - 1.0 K/uL   Eosinophils Relative 3 %   Eosinophils Absolute 0.2 0.0 - 0.7 K/uL   Basophils Relative 1 %   Basophils Absolute 0.0 0.0 - 0.1 K/uL    Comment: Performed at [facility], [address] [place], [place] [address]  Glucose, capillary     Status: Abnormal   Collection Time: 05/30/18  3:40 AM  Result Value Ref Range   Glucose-Capillary 135 (H) 70 - 99 mg/dL  Blood gas, arterial     Status: Abnormal   Collection Time: 05/30/18  4:05 AM  Result Value Ref Range   FIO2 35.00    Delivery systems VENTILATOR    Mode PRESSURE REGULATED VOLUME CONTROL    VT 620 mL   LHR 18.0 resp/min   Peep/cpap 5.0 cm H20   pH, Arterial 7.455 (H) 7.350 - 7.450   pCO2 arterial 32.3 32.0 - 48.0 mmHg   pO2, Arterial 121.0 (H) 83.0 - 108.0 mmHg   Bicarbonate 24.0 20.0 - 28.0 mmol/L   Acid-base  deficit 1.0 0.0 - 2.0 mmol/L   O2 Saturation 98.5 %   Patient temperature 37.0    Collection site RIGHT RADIAL    Drawn by [id number]    Sample type ARTERIAL    Allens test (pass/fail) PASS PASS    Comment: Performed at [facility], [address] [place], [place] [address]  Glucose, capillary     Status: Abnormal   Collection Time: 05/30/18  7:07 AM  Result Value Ref Range   Glucose-Capillary 143 (H) 70 - 99 mg/dL  Glucose, capillary     Status: Abnormal   Collection Time: 05/30/18 11:10 AM  Result Value Ref Range   Glucose-Capillary 108 (H) 70 - 99 mg/dL  Culture, blood (routine x 2)     Status: None (Preliminary result)   Collection Time: 05/30/18  3:10 PM  Result Value Ref Range   Specimen Description BLOOD LEFT ANTECUBITAL    Special Requests      BOTTLES DRAWN AEROBIC AND ANAEROBIC Blood Culture adequate volume   Culture      NO GROWTH < 24 HOURS Performed at [facility], [address] [place],  [address]    Report Status PENDING   Culture, blood (routine x 2)     Status: None (Preliminary result)   Collection Time: 05/30/18  3:47 PM  Result Value Ref Range   Specimen Description BLOOD LEFT FOREARM    Special Requests      BOTTLES DRAWN AEROBIC AND ANAEROBIC Blood Culture adequate volume   Culture      NO  GROWTH < 24 HOURS Performed at [facility], [address] [place], [place] [address]    Report Status PENDING   Glucose, capillary     Status: Abnormal   Collection Time: 05/30/18  4:17 PM  Result Value Ref Range   Glucose-Capillary 124 (H) 70 - 99 mg/dL  Glucose, capillary     Status: Abnormal   Collection Time: 05/30/18  7:49 PM  Result Value Ref Range   Glucose-Capillary 144 (H) 70 - 99 mg/dL  Glucose, capillary     Status: Abnormal   Collection Time: 05/31/18 12:23 AM  Result Value Ref Range   Glucose-Capillary 156 (H) 70 - 99 mg/dL  Basic metabolic panel     Status: Abnormal   Collection Time: 05/31/18  4:01 AM  Result Value Ref Range   Sodium  146 (H) 135 - 145 mmol/L   Potassium 3.8 3.5 - 5.1 mmol/L   Chloride 115 (H) 98 - 111 mmol/L   CO2 22 22 - 32 mmol/L   Glucose, Bld 160 (H) 70 - 99 mg/dL   BUN 17 8 - 23 mg/dL   Creatinine, Ser 0.94 0.61 - 1.24 mg/dL   Calcium 9.1 8.9 - 10.3 mg/dL   GFR calc non Af Amer >60 >60 mL/min   GFR calc Af Amer >60 >60 mL/min    Comment: (NOTE) The eGFR has been calculated using the CKD EPI equation. This calculation has not been validated in all clinical situations. eGFR's persistently <60 mL/min signify possible Chronic Kidney Disease.    Anion gap 9 5 - 15    Comment: Performed at [facility], [address], [place], [place] [address]  CBC     Status: Abnormal   Collection Time: 05/31/18  4:01 AM  Result Value Ref Range   WBC 7.3 4.0 - 10.5 K/uL   RBC 3.31 (L) 4.22 - 5.81 MIL/uL   Hemoglobin 9.3 (L) 13.0 - 17.0 g/dL   HCT 30.5 (L) 39.0 - 52.0 %   MCV 92.1 78.0 - 100.0 fL   MCH 28.1 26.0 - 34.0 pg   MCHC 30.5 30.0 - 36.0 g/dL   RDW 16.5 (H) 11.5 - 15.5 %   Platelets 264 150 - 400 K/uL    Comment: Performed at [facility], [address], [place], [place] [address]  Blood gas, arterial     Status: Abnormal   Collection Time: 05/31/18  4:10 AM  Result Value Ref Range   FIO2 30.00    Delivery systems VENTILATOR    Mode PRESSURE REGULATED VOLUME CONTROL    VT 620 mL   LHR 18.0 resp/min   Peep/cpap 5.0 cm H20   pH, Arterial 7.447 7.350 - 7.450   pCO2 arterial 33.0 32.0 - 48.0 mmHg   pO2, Arterial 111.0 (H) 83.0 - 108.0 mmHg   Bicarbonate 24.1 20.0 - 28.0 mmol/L   Acid-base deficit 1.2 0.0 - 2.0 mmol/L   O2 Saturation 97.9 %   Patient temperature 38.0    Collection site LEFT RADIAL    Drawn by [id number]    Sample type ARTERIAL    Allens test (pass/fail) PASS PASS    Comment: Performed at [facility], [address], [place], [place] [address]  Glucose, capillary     Status: Abnormal   Collection Time: 05/31/18  4:27 AM  Result Value Ref Range   Glucose-Capillary 147 (H) 70 -  99 mg/dL  Glucose, capillary     Status: Abnormal   Collection Time: 05/31/18  7:27 AM  Result Value Ref Range   Glucose-Capillary 156 (H) 70 - 99 mg/dL  Blood gas, arterial     Status: Abnormal   Collection Time: 05/31/18 10:20 AM  Result Value Ref Range   FIO2 40.00    Delivery systems VENTILATOR    Mode CONTINUOUS POSITIVE AIRWAY PRESSURE    Peep/cpap 5.0 cm H20   Pressure support 5 cm H20   pH, Arterial 7.449 7.350 - 7.450   pCO2 arterial 33.2 32.0 - 48.0 mmHg   pO2, Arterial 157.0 (H) 83.0 - 108.0 mmHg   Bicarbonate 24.3 20.0 - 28.0 mmol/L   Acid-base deficit 0.8 0.0 - 2.0 mmol/L   O2 Saturation 99.2 %   Collection site LEFT RADIAL    Drawn by [id number]    Sample type ARTERIAL DRAW    Allens test (pass/fail) PASS PASS    Comment: Performed at [facility], [address] [place], [place] [address]  Glucose, capillary     Status: Abnormal   Collection Time: 05/31/18 11:08 AM  Result Value Ref Range   Glucose-Capillary 138 (H) 70 - 99 mg/dL  Glucose, capillary     Status: Abnormal   Collection Time: 05/31/18  4:14 PM  Result Value Ref Range   Glucose-Capillary 153 (H) 70 - 99 mg/dL   Comment 1 Notify RN   Glucose, capillary     Status: Abnormal   Collection Time: 05/31/18  7:40 PM  Result Value Ref Range   Glucose-Capillary 140 (H) 70 - 99 mg/dL    Lipid Panel Recent Labs    05/30/18 0337  TRIG 198*    Studies/Results:  CXR FINDINGS: Opacities at the lung bases are increasing most consistent with pneumonia with possible some atelectasis. No definite effusion is seen. The tip of the endotracheal tube is approximately 5.8 cm above the carina. Mediastinal and hilar contours are unremarkable and the descending thoracic aorta is ectatic. The heart is within normal limits in size. As noted previously the NG tube may be coiling within a hiatal hernia. Abdominal films would be helpful to assess further.  IMPRESSION: 1. Increasing bibasilar opacities most  consistent with pneumonia. Consider follow-up. 2. NG tube appears to be coiling within a hiatal hernia. Abdominal films would be helpful. 3. Tip of endotracheal tube is approximately 5.8 cm above the Carina.         Medications:  Scheduled Meds: . aspirin  300 mg Rectal Daily  . atorvastatin  80 mg Per Tube q1800  . chlorhexidine gluconate (MEDLINE KIT)  15 mL Mouth Rinse BID  . Chlorhexidine Gluconate Cloth  6 each Topical Daily  . Chlorhexidine Gluconate Cloth  6 each Topical Daily  . enoxaparin (LOVENOX) injection  80 mg Subcutaneous Q12H  . insulin aspart  0-15 Units Subcutaneous Q4H  . ipratropium-albuterol  3 mL Nebulization Q6H  . mouth rinse  15 mL Mouth Rinse 10 times per day  . metoprolol tartrate  5 mg Intravenous Q6H  . sodium chloride flush  10-40 mL Intracatheter Q12H  . sodium chloride flush  3 mL Intravenous Q12H   Continuous Infusions: . Marland KitchenTPN (CLINIMIX-E) Adult 80 mL/hr at 05/31/18 1812  . sodium chloride 250 mL (05/31/18 1112)  . dextrose 5 % 1,000 mL with potassium chloride 40 mEq infusion 20 mL/hr at 05/31/18 1823  . famotidine (PEPCID) IV 20 mg (05/31/18 1027)  . piperacillin-tazobactam (ZOSYN)  IV 3.375 g (05/31/18 1935)   PRN Meds:.sodium chloride, acetaminophen **OR** acetaminophen, albuterol, docusate, fentaNYL (SUBLIMAZE) injection, fentaNYL (SUBLIMAZE) injection, hydrALAZINE,  midazolam, midazolam, ondansetron **OR** ondansetron (ZOFRAN) IV, polyvinyl alcohol, sodium chloride flush, sodium chloride flush, sodium chloride flush     LOS: 8 days   Helia Haese A. Merlene Laughter, M.D.  Diplomate, Tax adviser of Psychiatry and Neurology ( Neurology).

## 2018-05-31 NOTE — Progress Notes (Signed)
Subjective: He seems about the same.  His chest x-ray looked a little better yesterday.  He continues to have atrial fib.  He has started on TPN now.  He still has fever.  Discussed with his son at bedside regarding whether they would want him to be reintubated if he is extubated today.  His initial impression is that yes they would like that but he says he is not speaking for the rest of the family so I have asked him to try to clarify that issue.  Objective: Vital signs in last 24 hours: Temp:  [99.7 F (37.6 C)-100.8 F (38.2 C)] 100.2 F (37.9 C) (09/25 0700) Pulse Rate:  [43-130] 92 (09/25 0700) Resp:  [20-42] 23 (09/25 0700) BP: (118-159)/(63-95) 136/85 (09/25 0700) SpO2:  [97 %-100 %] 100 % (09/25 0700) FiO2 (%):  [30 %-35 %] 30 % (09/25 0355) Weight:  [81.2 kg] 81.2 kg (09/25 0435) Weight change: 0.7 kg Last BM Date: (Unkown pt also NPO)  Intake/Output from previous day: 09/24 0701 - 09/25 0700 In: 2339.5 [I.V.:2171.4; IV Piggyback:168.2] Out: 2250 [Urine:2250]  PHYSICAL EXAM General appearance: Intubated sedated on mechanical ventilation Resp: rhonchi bilaterally Cardio: irregularly irregular rhythm GI: soft, non-tender; bowel sounds normal; no masses,  no organomegaly Extremities: extremities normal, atraumatic, no cyanosis or edema  Lab Results:  Results for orders placed or performed during the hospital encounter of 05/24/2018 (from the past 48 hour(s))  Glucose, capillary     Status: Abnormal   Collection Time: 05/29/18 11:10 AM  Result Value Ref Range   Glucose-Capillary 140 (H) 70 - 99 mg/dL  Glucose, capillary     Status: Abnormal   Collection Time: 05/29/18  4:04 PM  Result Value Ref Range   Glucose-Capillary 135 (H) 70 - 99 mg/dL  Glucose, capillary     Status: Abnormal   Collection Time: 05/29/18  7:41 PM  Result Value Ref Range   Glucose-Capillary 128 (H) 70 - 99 mg/dL  Glucose, capillary     Status: Abnormal   Collection Time: 05/29/18 11:58 PM   Result Value Ref Range   Glucose-Capillary 132 (H) 70 - 99 mg/dL  CBC     Status: Abnormal   Collection Time: 05/30/18  3:37 AM  Result Value Ref Range   WBC 6.4 4.0 - 10.5 K/uL   RBC 3.24 (L) 4.22 - 5.81 MIL/uL   Hemoglobin 9.0 (L) 13.0 - 17.0 g/dL   HCT 29.7 (L) 39.0 - 52.0 %   MCV 91.7 78.0 - 100.0 fL   MCH 27.8 26.0 - 34.0 pg   MCHC 30.3 30.0 - 36.0 g/dL   RDW 16.7 (H) 11.5 - 15.5 %   Platelets 238 150 - 400 K/uL    Comment: Performed at [facility], [address], [place], [place] [address]  Comprehensive metabolic panel     Status: Abnormal   Collection Time: 05/30/18  3:37 AM  Result Value Ref Range   Sodium 146 (H) 135 - 145 mmol/L   Potassium 3.9 3.5 - 5.1 mmol/L   Chloride 117 (H) 98 - 111 mmol/L   CO2 21 (L) 22 - 32 mmol/L   Glucose, Bld 150 (H) 70 - 99 mg/dL   BUN 15 8 - 23 mg/dL   Creatinine, Ser 0.92 0.61 - 1.24 mg/dL   Calcium 8.7 (L) 8.9 - 10.3 mg/dL   Total Protein 5.6 (L) 6.5 - 8.1 g/dL   Albumin 2.5 (L) 3.5 - 5.0 g/dL   AST 22 15 - 41 U/L  ALT 19 0 - 44 U/L   Alkaline Phosphatase 80 38 - 126 U/L   Total Bilirubin 0.6 0.3 - 1.2 mg/dL   GFR calc non Af Amer >60 >60 mL/min   GFR calc Af Amer >60 >60 mL/min    Comment: (NOTE) The eGFR has been calculated using the CKD EPI equation. This calculation has not been validated in all clinical situations. eGFR's persistently <60 mL/min signify possible Chronic Kidney Disease.    Anion gap 8 5 - 15    Comment: Performed at [facility], [address], [place], [place] [address]  Prealbumin     Status: Abnormal   Collection Time: 05/30/18  3:37 AM  Result Value Ref Range   Prealbumin 11.3 (L) 18 - 38 mg/dL    Comment: Performed at [place] [address] [place], [place] [address]  Magnesium     Status: None   Collection Time: 05/30/18  3:37 AM  Result Value Ref Range   Magnesium 2.2 1.7 - 2.4 mg/dL    Comment: Performed at [facility], [address] [place], [place] [address]  Phosphorus      Status: None   Collection Time: 05/30/18  3:37 AM  Result Value Ref Range   Phosphorus 3.3 2.5 - 4.6 mg/dL    Comment: Performed at [facility], [address], [place], [place] [address]  Triglycerides     Status: Abnormal   Collection Time: 05/30/18  3:37 AM  Result Value Ref Range   Triglycerides 198 (H) <150 mg/dL    Comment: Performed at [facility], [address], [place], [place] [address]  Differential     Status: None   Collection Time: 05/30/18  3:37 AM  Result Value Ref Range   Neutrophils Relative % 67 %   Neutro Abs 4.4 1.7 - 7.7 K/uL   Lymphocytes Relative 22 %   Lymphs Abs 1.4 0.7 - 4.0 K/uL   Monocytes Relative 7 %   Monocytes Absolute 0.4 0.1 - 1.0 K/uL   Eosinophils Relative 3 %   Eosinophils Absolute 0.2 0.0 - 0.7 K/uL   Basophils Relative 1 %   Basophils Absolute 0.0 0.0 - 0.1 K/uL    Comment: Performed at [facility], [address], [place], [place] [address]  Glucose, capillary     Status: Abnormal   Collection Time: 05/30/18  3:40 AM  Result Value Ref Range   Glucose-Capillary 135 (H) 70 - 99 mg/dL  Blood gas, arterial     Status: Abnormal   Collection Time: 05/30/18  4:05 AM  Result Value Ref Range   FIO2 35.00    Delivery systems VENTILATOR    Mode PRESSURE REGULATED VOLUME CONTROL    VT 620 mL   LHR 18.0 resp/min   Peep/cpap 5.0 cm H20   pH, Arterial 7.455 (H) 7.350 - 7.450   pCO2 arterial 32.3 32.0 - 48.0 mmHg   pO2, Arterial 121.0 (H) 83.0 - 108.0 mmHg   Bicarbonate 24.0 20.0 - 28.0 mmol/L   Acid-base deficit 1.0 0.0 - 2.0 mmol/L   O2 Saturation 98.5 %   Patient temperature 37.0    Collection site RIGHT RADIAL    Drawn by [id number]    Sample type ARTERIAL    Allens test (pass/fail) PASS PASS    Comment: Performed at [facility], [address], [place],  [address]  Glucose, capillary     Status: Abnormal   Collection Time: 05/30/18  7:07 AM  Result Value Ref  Range   Glucose-Capillary 143 (H) 70 - 99 mg/dL  Glucose, capillary      Status: Abnormal   Collection Time: 05/30/18 11:10 AM  Result Value Ref Range   Glucose-Capillary 108 (H) 70 - 99 mg/dL  Culture, blood (routine x 2)     Status: None (Preliminary result)   Collection Time: 05/30/18  3:10 PM  Result Value Ref Range   Specimen Description BLOOD LEFT ANTECUBITAL    Special Requests      BOTTLES DRAWN AEROBIC AND ANAEROBIC Blood Culture adequate volume   Culture      NO GROWTH < 24 HOURS Performed at [facility], [address] [place], [place] [address]    Report Status PENDING   Culture, blood (routine x 2)     Status: None (Preliminary result)   Collection Time: 05/30/18  3:47 PM  Result Value Ref Range   Specimen Description BLOOD LEFT FOREARM    Special Requests      BOTTLES DRAWN AEROBIC AND ANAEROBIC Blood Culture adequate volume   Culture      NO GROWTH < 24 HOURS Performed at [facility], [address], [place], [place] [address]    Report Status PENDING   Glucose, capillary     Status: Abnormal   Collection Time: 05/30/18  4:17 PM  Result Value Ref Range   Glucose-Capillary 124 (H) 70 - 99 mg/dL  Glucose, capillary     Status: Abnormal   Collection Time: 05/30/18  7:49 PM  Result Value Ref Range   Glucose-Capillary 144 (H) 70 - 99 mg/dL  Glucose, capillary     Status: Abnormal   Collection Time: 05/31/18 12:23 AM  Result Value Ref Range   Glucose-Capillary 156 (H) 70 - 99 mg/dL  Basic metabolic panel     Status: Abnormal   Collection Time: 05/31/18  4:01 AM  Result Value Ref Range   Sodium 146 (H) 135 - 145 mmol/L   Potassium 3.8 3.5 - 5.1 mmol/L   Chloride 115 (H) 98 - 111 mmol/L   CO2 22 22 - 32 mmol/L   Glucose, Bld 160 (H) 70 - 99 mg/dL   BUN 17 8 - 23 mg/dL   Creatinine, Ser 0.94 0.61 - 1.24 mg/dL   Calcium 9.1 8.9 - 10.3 mg/dL   GFR calc non Af Amer >60 >60 mL/min   GFR calc Af Amer >60 >60 mL/min    Comment: (NOTE) The eGFR has been calculated using the CKD EPI equation. This calculation has not been validated in  all clinical situations. eGFR's persistently <60 mL/min signify possible Chronic Kidney Disease.    Anion gap 9 5 - 15    Comment: Performed at [facility], [address] [place], [place] [address]  CBC     Status: Abnormal   Collection Time: 05/31/18  4:01 AM  Result Value Ref Range   WBC 7.3 4.0 - 10.5 K/uL   RBC 3.31 (L) 4.22 - 5.81 MIL/uL   Hemoglobin 9.3 (L) 13.0 - 17.0 g/dL   HCT 30.5 (L) 39.0 - 52.0 %   MCV 92.1 78.0 - 100.0 fL   MCH 28.1 26.0 - 34.0 pg   MCHC 30.5 30.0 - 36.0 g/dL   RDW 16.5 (H) 11.5 - 15.5 %   Platelets 264 150 - 400 K/uL    Comment: Performed at [facility], [address] [place],  [address]  Blood gas, arterial     Status: Abnormal   Collection Time: 05/31/18  4:10  AM  Result Value Ref Range   FIO2 30.00    Delivery systems VENTILATOR    Mode PRESSURE REGULATED VOLUME CONTROL    VT 620 mL   LHR 18.0 resp/min   Peep/cpap 5.0 cm H20   pH, Arterial 7.447 7.350 - 7.450   pCO2 arterial 33.0 32.0 - 48.0 mmHg   pO2, Arterial 111.0 (H) 83.0 - 108.0 mmHg   Bicarbonate 24.1 20.0 - 28.0 mmol/L   Acid-base deficit 1.2 0.0 - 2.0 mmol/L   O2 Saturation 97.9 %   Patient temperature 38.0    Collection site LEFT RADIAL    Drawn by [id number]    Sample type ARTERIAL    Allens test (pass/fail) PASS PASS    Comment: Performed at [facility], [address] [place], [place] [address]  Glucose, capillary     Status: Abnormal   Collection Time: 05/31/18  4:27 AM  Result Value Ref Range   Glucose-Capillary 147 (H) 70 - 99 mg/dL  Glucose, capillary     Status: Abnormal   Collection Time: 05/31/18  7:27 AM  Result Value Ref Range   Glucose-Capillary 156 (H) 70 - 99 mg/dL    ABGS Recent Labs    05/31/18 0410  PHART 7.447  PO2ART 111.0*  HCO3 24.1   CULTURES Recent Results (from the past 240 hour(s))  Culture, blood (routine x 2)     Status: Abnormal   Collection Time: 05/24/18  6:54 PM  Result Value Ref Range Status   Specimen Description   Final     BLOOD RIGHT HAND Performed at [facility], [address] [place], [place] [address]    Special Requests   Final    BOTTLES DRAWN AEROBIC AND ANAEROBIC Blood Culture adequate volume Performed at [facility], [address] [place], [place] [address]    Culture  Setup Time   Final    GRAM POSITIVE COCCI ANAEROBIC BOTTLE GRAM STAINED RESULT CALLED TO/READ BACK FROM MISTY SMART AT 1300  BY HFLYNT 05/25/18 CRITICAL RESULT CALLED TO, READ BACK BY AND VERIFIED WITH: RN E MURPHY [phone] 1824 MLM    Culture (A)  Final    STAPHYLOCOCCUS SPECIES (COAGULASE NEGATIVE) THE SIGNIFICANCE OF ISOLATING THIS ORGANISM FROM A SINGLE SET OF BLOOD CULTURES WHEN MULTIPLE SETS ARE DRAWN IS UNCERTAIN. PLEASE NOTIFY THE MICROBIOLOGY DEPARTMENT WITHIN ONE WEEK IF SPECIATION AND SENSITIVITIES ARE REQUIRED. Performed at [facility], [place] [address] [place], [place] [address]    Report Status 05/27/2018 FINAL  Final  Blood Culture ID Panel (Reflexed)     Status: Abnormal   Collection Time: 05/24/18  6:54 PM  Result Value Ref Range Status   Enterococcus species NOT DETECTED NOT DETECTED Final   Listeria monocytogenes NOT DETECTED NOT DETECTED Final   Staphylococcus species DETECTED (A) NOT DETECTED Final    Comment: Methicillin (oxacillin) resistant coagulase negative staphylococcus. Possible blood culture contaminant (unless isolated from more than one blood culture draw or clinical case suggests pathogenicity). No antibiotic treatment is indicated for blood  culture contaminants. CRITICAL RESULT CALLED TO, READ BACK BY AND VERIFIED WITH: RN E MURPHY [phone] 1824 MLM    Staphylococcus aureus NOT DETECTED NOT DETECTED Final   Methicillin resistance DETECTED (A) NOT DETECTED Final    Comment: CRITICAL RESULT CALLED TO, READ BACK BY AND VERIFIED WITH: RN E MURPHY [phone] [id number] MLM    Streptococcus species NOT DETECTED NOT DETECTED Final   Streptococcus agalactiae NOT DETECTED NOT DETECTED Final    Streptococcus  pneumoniae NOT DETECTED NOT DETECTED Final   Streptococcus pyogenes NOT DETECTED NOT DETECTED Final   Acinetobacter baumannii NOT DETECTED NOT DETECTED Final   Enterobacteriaceae species NOT DETECTED NOT DETECTED Final   Enterobacter cloacae complex NOT DETECTED NOT DETECTED Final   Escherichia coli NOT DETECTED NOT DETECTED Final   Klebsiella oxytoca NOT DETECTED NOT DETECTED Final   Klebsiella pneumoniae NOT DETECTED NOT DETECTED Final   Proteus species NOT DETECTED NOT DETECTED Final   Serratia marcescens NOT DETECTED NOT DETECTED Final   Haemophilus influenzae NOT DETECTED NOT DETECTED Final   Neisseria meningitidis NOT DETECTED NOT DETECTED Final   Pseudomonas aeruginosa NOT DETECTED NOT DETECTED Final   Candida albicans NOT DETECTED NOT DETECTED Final   Candida glabrata NOT DETECTED NOT DETECTED Final   Candida krusei NOT DETECTED NOT DETECTED Final   Candida parapsilosis NOT DETECTED NOT DETECTED Final   Candida tropicalis NOT DETECTED NOT DETECTED Final    Comment: Performed at [facility], [place] [address] [place], [place] [address]  Culture, blood (routine x 2)     Status: None   Collection Time: 05/24/18  7:03 PM  Result Value Ref Range Status   Specimen Description BLOOD LEFT HAND  Final   Special Requests   Final    BOTTLES DRAWN AEROBIC AND ANAEROBIC Blood Culture adequate volume   Culture   Final    NO GROWTH 5 DAYS Performed at [facility], [address], [place], [place] [address]    Report Status 05/29/2018 FINAL  Final  MRSA PCR Screening     Status: None   Collection Time: 05/25/18  8:34 PM  Result Value Ref Range Status   MRSA by PCR NEGATIVE NEGATIVE Final    Comment:        The GeneXpert MRSA Assay (FDA approved for NASAL specimens only), is one component of a comprehensive MRSA colonization surveillance program. It is not intended to diagnose MRSA infection nor to guide or monitor treatment for MRSA infections. Performed at  [facility], [address] [place], [place] [address]   Culture, blood (routine x 2)     Status: None (Preliminary result)   Collection Time: 05/30/18  3:10 PM  Result Value Ref Range Status   Specimen Description BLOOD LEFT ANTECUBITAL  Final   Special Requests   Final    BOTTLES DRAWN AEROBIC AND ANAEROBIC Blood Culture adequate volume   Culture   Final    NO GROWTH < 24 HOURS Performed at [facility], [address], [place], [place] [address]    Report Status PENDING  Incomplete  Culture, blood (routine x 2)     Status: None (Preliminary result)   Collection Time: 05/30/18  3:47 PM  Result Value Ref Range Status   Specimen Description BLOOD LEFT FOREARM  Final   Special Requests   Final    BOTTLES DRAWN AEROBIC AND ANAEROBIC Blood Culture adequate volume   Culture   Final    NO GROWTH < 24 HOURS Performed at [facility], [address] [place], [place] [address]    Report Status PENDING  Incomplete   Studies/Results: Dg Chest Port 1 View  Result Date: 05/30/2018 CLINICAL DATA:  Status post PICC line placement. EXAM: PORTABLE CHEST 1 VIEW COMPARISON:  Portable chest x-ray of May 29, 2018 FINDINGS: The right-sided PICC line has its tip terminating in the region of the midportion of the SVC. No immediate postprocedure complication is observed. The lungs are adequately inflated. The interstitial  markings remain coarse. There is hazy increased density at the left lung base which is not new and has in fact improved since yesterday's study. The heart and pulmonary vascularity are normal. The endotracheal tube tip projects approximately 5.2 cm above the carina. IMPRESSION: Good positioning of the right PICC line with the tip projecting over the midportion of the SVC. Slight improvement in the appearance of left basilar atelectasis or pneumonia. Electronically Signed   By: David  Martinique M.D.   On: 05/30/2018 15:01    Medications:  Prior to Admission:  Medications Prior to  Admission  Medication Sig Dispense Refill Last Dose  . Acetaminophen 500 MG coapsule Take 500 mg by mouth as needed for pain.    unknown  . amLODipine (NORVASC) 5 MG tablet TAKE 1 TABLET BY MOUTH DAILY. (Patient taking differently: Take 5 mg by mouth daily. ) 90 tablet 1 05/22/2018 at Unknown time  . atorvastatin (LIPITOR) 40 MG tablet Take 1 tablet (40 mg total) by mouth daily. 90 tablet 1 05/16/2018 at Unknown time  . benazepril (LOTENSIN) 40 MG tablet Take 1 tablet (40 mg total) by mouth 2 (two) times daily. 180 tablet 1 05/29/2018 at Unknown time  . betamethasone dipropionate 0.05 % lotion Apply topically 2 (two) times daily. (Patient taking differently: Apply 1 application topically 2 (two) times daily as needed (for irritation). ) 120 mL 0 unknown  . colchicine 0.6 MG tablet Take 1 tablet (0.6 mg total) by mouth daily. Continue daily until you have had no pain in your foot for 2 days.  Then stop. (Patient taking differently: Take 0.6 mg by mouth daily as needed (for gout pain as directed). Continue daily until you have had no pain in your foot for 2 days.  Then stop.) 20 tablet 0 unknown  . fenofibrate 160 MG tablet TAKE 1 TABLET EVERY DAY (Patient taking differently: Take 160 mg by mouth daily. ) 90 tablet 0 06/01/2018 at Unknown time  . furosemide (LASIX) 20 MG tablet TAKE 1 TABLET TWICE DAILY (Patient taking differently: Take 20 mg by mouth 2 (two) times daily. TAKE 1 TABLET TWICE DAILY) 180 tablet 1 05/28/2018 at Unknown time  . gabapentin (NEURONTIN) 400 MG capsule TAKE 2 CAPSULES BY MOUTH 2 (TWO) TIMES DAILY. (Patient taking differently: Take 800 mg by mouth 2 (two) times daily. ) 360 capsule 0 05/30/2018 at Unknown time  . metFORMIN (GLUCOPHAGE) 500 MG tablet Take 500 mg by mouth 2 (two) times daily with a meal.    05/31/2018 at Unknown time  . potassium chloride SA (K-DUR,KLOR-CON) 20 MEQ tablet TAKE 2 TABLETS TWICE DAILY (Patient taking differently: Take 40 mEq by mouth 2 (two) times daily. ) 360  tablet 1 05/10/2018 at Unknown time  . rivaroxaban (XARELTO) 20 MG TABS tablet Take 20 mg by mouth daily with supper.    05/29/2018 at Unknown time   Scheduled: . aspirin  300 mg Rectal Daily  . atorvastatin  80 mg Per Tube q1800  . chlorhexidine gluconate (MEDLINE KIT)  15 mL Mouth Rinse BID  . Chlorhexidine Gluconate Cloth  6 each Topical Daily  . Chlorhexidine Gluconate Cloth  6 each Topical Daily  . enoxaparin (LOVENOX) injection  80 mg Subcutaneous Q12H  . feeding supplement (PRO-STAT SUGAR FREE 64)  30 mL Per Tube Daily  . insulin aspart  0-15 Units Subcutaneous Q4H  . ipratropium-albuterol  3 mL Nebulization Q6H  . mouth rinse  15 mL Mouth Rinse 10 times per day  . metoprolol tartrate  5 mg Intravenous Q6H  . sodium chloride flush  10-40 mL Intracatheter Q12H  . sodium chloride flush  3 mL Intravenous Q12H   Continuous: . Marland KitchenTPN (CLINIMIX-E) Adult 40 mL/hr at 05/31/18 0657  . sodium chloride 10 mL/hr at 05/27/18 0500  . dextrose 5 % 1,000 mL with potassium chloride 40 mEq infusion 60 mL/hr at 05/31/18 0657  . famotidine (PEPCID) IV Stopped (05/30/18 1025)  . feeding supplement (VITAL AF 1.2 CAL)    . piperacillin-tazobactam (ZOSYN)  IV 12.5 mL/hr at 05/31/18 0657   DTH:YHOOIL chloride, acetaminophen **OR** acetaminophen, albuterol, docusate, fentaNYL (SUBLIMAZE) injection, fentaNYL (SUBLIMAZE) injection, hydrALAZINE, midazolam, midazolam, ondansetron **OR** ondansetron (ZOFRAN) IV, polyvinyl alcohol, sodium chloride flush, sodium chloride flush, sodium chloride flush  Assesment: He was admitted with CVA.  Initially he did well but then developed increasing somnolence trouble protecting his airway that eventually culminated in him being intubated and placed on mechanical ventilation.  He has remained on the ventilator since.  He has aspiration pneumonia which is being treated.  He still has some fever.  His gag reflex was better 2 days ago.  He has atrial fib which is stable.  He  has malnutrition and he is been started on feedings. Principal Problem:   CVA (cerebral vascular accident) [facility]) Active Problems:   Essential hypertension   Peripheral neuropathy   Hyperlipidemia   Hypokalemia   Diabetes mellitus with neuropathy (HCC)   Paroxysmal atrial fibrillation (HCC)   AKI (acute kidney injury) ([place])   Chronic diastolic congestive heart failure (HCC)   Chronic anticoagulation   Iron deficiency anemia due to chronic blood loss   Acute respiratory failure (HCC)   Aspiration pneumonia of both lower lobes due to gastric secretions (HCC)   Malnutrition of moderate degree   Pressure injury of skin   Goals of care, counseling/discussion   Palliative care by specialist   DNR (do not resuscitate) discussion    Plan: See if he can wean to extubation today.  I would like some clarity from the family about whether we plan to reintubate.      LOS: 8 days   Marytza Grandpre L 05/31/2018, 8:49 AM

## 2018-05-31 NOTE — Progress Notes (Signed)
[place] NOTE   Pharmacy Consult for TPN Indication: NPO x 1 week, unable to eat/unable to place NG/OG   Patient Measurements: Height: 6' (182.9 cm) Weight: 179 lb 0.2 oz (81.2 kg) IBW/kg (Calculated) : 77.6 TPN AdjBW (KG): 80.5 Body mass index is 24.28 kg/m.  Assessment:  admitted 05/19/2018 with left sided weakness and acute right basal ganglia CVA. Shortly after admission, patient became increasingly lethargic and started having fevers. The patient developed respiratory distress and became unresponsive; subsequently, he was intubated. He decompensated secondary to aspiration PNA. Multiple attempts have been made to pass OG to provide medications/feeding but has failed and repeatedly gets coiled in his hiatal hernia. No nutrition for 1 week, plan is to place a PICC line and start TPN. BG with only slight increase. RT to attempt weaning today. Unable to meet full nutritional needs without lipids at this time. If patient is able to be extubated, maybe able to add lipids earlier.  GI: NPO, intubated and unable to Place feeding tube Insulin requirements in the past 24 hours: 11 units Lytes:stable. BS stable, continue to monitor with increase in TPN Renal: Stable Pulm:  Intubated/ Aspiration PNA Neuro: S/P CVA ID: Tmax 100.8 WBC normalized, 1 of 2 BCX with CONS, likely contaminant.  Aspiration PNA TPN Access:  PICC line TPN start date: 05/30/2018 Nutritional Goals (per RD recommendation)=> Estimated Nutritional Needs:  Kcal:  2100 kcals (PSU 2003b) Protein:  121-138g Pro (1.5-1.7g/kg bw)  Current Nutrition: NPO  Plan:  Increase TPN to 80 mL/hr. Hold 20% lipid emulsion for first 7 days for ICU patients per ASPEN guidelines .  This TPN provides 96 g of protein, 288 g of dextrose which provides 1363 kCals per day Electrolytes in TPN: standard Add MVI, trace elements, 0 units of regular insulin, and folic acid to TPN Low dose SSI and adjust as  needed Adjust IVMF (D5W + 39meq KCL) at 20 ml/hr Monitor TPN labs  Isac Sarna, BS Vena Austria, BCPS Clinical Pharmacist Pager [phone] 05/31/2018,10:49 AM

## 2018-05-31 NOTE — Progress Notes (Signed)
Palliative: Mr. Covault is lying in bed.  He is intubated/ventilated, but not sedated.  His eyes are open, and I am not sure but he may be tracking my movements.   He is weaning again today.  Present today at bedside is daughter [name].  We talked about weaning, and that Mr. Rahimi will likely not extubate today.  I shared that we will continue weaning attempts tomorrow.  I greatly encourage family to decide about reintubation.  [name] is on the phone with her brother at this time.  She states that they have no questions.  I shared that pulmonologist, Dr. Luan Pulling, will follow up in the morning, he will want to know their desire about reintubation if needed.  Conference with nursing staff related to patient condition, weaning trials, goals of care. Brief conference with hospitalist this morning related to patient condition, weaning trials, goals of care.  61 minutes Quinn Axe, NP Palliative Medicine Team Team Phone # [phone]  Greater than 50% of this time was spent counseling and coordinating care related to the above assessment and plan.

## 2018-05-31 NOTE — Progress Notes (Signed)
Nutrition Follow-up  DOCUMENTATION CODES:  Severe malnutrition in context of acute illness/injury  INTERVENTION:  TPN per pharmacy  If family agreeable, PEG vs NGT by IR (if possible) w/ enteral nutrition would be more appropriate than TPN given functional GI tract.  Will d/c existing tf orders  NUTRITION DIAGNOSIS:  Severe Malnutrition related to acute illness (PNA) and  lethargy/CVA as evidenced by moderate muscle/fat loss and loss of >7.5% in past 3 months (~10%)  GOAL:  Patient will meet greater than or equal to 90% of their needs  MONITOR:  Diet advancement, Vent status, Labs, Weight trends, Skin, I & O's  REASON FOR ASSESSMENT:  Consult New TPN/TNA  ASSESSMENT:  82 y/o male PMHx afib, HTN/HLD, HF, DM2. Brought to ED w/ worsening BLE, AMS and weakness. More acutely, developed inability to move LLE. On workup, found to have suffered L CVA. 9/18, Pt failed swallow eval and later that evening, developed SOB, lethargy, tachycardia. Became obtunded and intubated for airway protection. Felt to have aspirated  9/18: intubated.  9/19 ogt removed as it was felt located in hiatal hernia. Replacement attempts also coiled into hernia. No access present at this time.  9/19: ogt placement unsuccessful coiled in hernia 9/24: NPO x7 days-start TPN.   Pt has been on TPN at rate of 40 cc/hr. No intralipid at this time. BG only appears to have had mild increase with initiation of TPN. Should be able to increase to goal today.   RT ready to reattempt weaning with hopeful extubation.   Have been forced to pursue TPN as all attempts to obtain enteral access have resulted in tube coiling in hernia.   Per chart, the pt's ubw appears to be 205-220 lbs, which he has maintained for the bast few years up until June of this year. He is substantially lower than this at the moment. Dry weight this admission is 177.5 lbs. Meets criteria for malnutrition.   Mildly febrile. Abdomen is soft, non  distended. No bm since admit, but has had no intake either. Upper body muscle/fat wasting more pronounced than it was last 2022/12/31. Meets severe malnutrition criteria now based on weight loss and moderate depletions.   Goals of care discussions ongoing. Family seems to desire aggressive care. As such, would recommend a PEG if patient is found to have significant swallow dysfunction on extubation or if unable to be extubated a placement of an NGT by IR.    Patient is currently intubated on ventilator support MV: 13.9 L/min Temp (24hrs), Avg:100.3 F (37.9 C), Min:99.7 F (37.6 C), Max:100.8 F (38.2 C) Propofol: None  Labs: BGs:140-160, BUN/Creat: 17/.94, WBC:WDL, Na:146, A1C 5.5 Meds: Insulin, IVF, IV abx, IV h2ra,  TPN Sedation: PRN Versed/fentanyl  Recent Labs  Lab 05/27/18 0350 05/27/18 1615  05/29/18 0401 05/30/18 0337 05/31/18 0401  NA 146*  --    < > 149* 146* 146*  K 3.4*  --    < > 3.5 3.9 3.8  CL 114*  --    < > 117* 117* 115*  CO2 25  --    < > 23 21* 22  BUN 20  --    < > 15 15 17   CREATININE 1.12  --    < > 0.90 0.92 0.94  CALCIUM 8.8*  --    < > 9.1 8.7* 9.1  MG 2.1 2.0  --   --  2.2  --   PHOS 2.5 2.6  --   --  3.3  --  GLUCOSE 147*  --    < > 162* 150* 160*   < > = values in this interval not displayed.   NUTRITION - FOCUSED PHYSICAL EXAM:   Most Recent Value  Orbital Region  Moderate depletion  Upper Arm Region  No depletion  Thoracic and Lumbar Region  Moderate depletion  Buccal Region  Mild depletion  Temple Region  Moderate depletion  Clavicle Bone Region  Moderate depletion  Clavicle and Acromion Bone Region  Moderate depletion  Scapular Bone Region  Unable to assess  Dorsal Hand  Unable to assess  Patellar Region  No depletion  Anterior Thigh Region  No depletion  Posterior Calf Region  No depletion  Edema (RD Assessment)  None  Hair  Reviewed  Eyes  Reviewed  Mouth  Reviewed  Skin  Reviewed  Nails  Reviewed     Diet Order:   Diet Order             Diet NPO time specified  Diet effective now             EDUCATION NEEDS:  No education needs have been identified at this time  Skin:  PU stage 1 to sacrum  Last BM:  Unknown  Height:  Ht Readings from Last 1 Encounters:  05/31/18 6' (1.829 m)   Weight:  Wt Readings from Last 1 Encounters:  05/31/18 81.2 kg   Wt Readings from Last 10 Encounters:  05/31/18 81.2 kg  05/19/18 93 kg  05/12/18 93.4 kg  04/10/18 87.4 kg  02/20/18 90.7 kg  12/22/17 95.3 kg  12/07/17 93 kg  11/21/17 96.1 kg  10/31/17 94.3 kg  08/22/17 93.6 kg  Dry weight this admission 177.5 lbs.   Ideal Body Weight:  80.91 kg  BMI:  Body mass index is 24.28 kg/m.  Estimated Nutritional Needs:  Kcal:  2100 kcals (PSU 2003b) Protein:  121-138g Pro (1.5-1.7g/kg bw) Fluid:  Per MD goals  Burtis Junes RD, LDN, CNSC Clinical Nutrition Available Tues-Sat via Pager: [phone] 05/31/2018 9:22 AM

## 2018-06-01 LAB — GLUCOSE, CAPILLARY
GLUCOSE-CAPILLARY: 172 mg/dL — AB (ref 70–99)
GLUCOSE-CAPILLARY: 179 mg/dL — AB (ref 70–99)
GLUCOSE-CAPILLARY: 186 mg/dL — AB (ref 70–99)
Glucose-Capillary: 139 mg/dL — ABNORMAL HIGH (ref 70–99)
Glucose-Capillary: 191 mg/dL — ABNORMAL HIGH (ref 70–99)
Glucose-Capillary: 150 mg/dL — ABNORMAL HIGH (ref 70–99)

## 2018-06-01 LAB — COMPREHENSIVE METABOLIC PANEL
ALK PHOS: 122 U/L (ref 38–126)
ALT: 23 U/L (ref 0–44)
AST: 19 U/L (ref 15–41)
Albumin: 2.5 g/dL — ABNORMAL LOW (ref 3.5–5.0)
Anion gap: 7 (ref 5–15)
BILIRUBIN TOTAL: 0.3 mg/dL (ref 0.3–1.2)
BUN: 20 mg/dL (ref 8–23)
CALCIUM: 9.3 mg/dL (ref 8.9–10.3)
CO2: 23 mmol/L (ref 22–32)
CREATININE: 0.76 mg/dL (ref 0.61–1.24)
Chloride: 116 mmol/L — ABNORMAL HIGH (ref 98–111)
Glucose, Bld: 165 mg/dL — ABNORMAL HIGH (ref 70–99)
Potassium: 3.5 mmol/L (ref 3.5–5.1)
Sodium: 146 mmol/L — ABNORMAL HIGH (ref 135–145)
TOTAL PROTEIN: 6 g/dL — AB (ref 6.5–8.1)

## 2018-06-01 LAB — BLOOD GAS, ARTERIAL
Acid-base deficit: 0.9 mmol/L (ref 0.0–2.0)
Bicarbonate: 24.2 mmol/L (ref 20.0–28.0)
Drawn by: 21310
FIO2: 30
LHR: 18 {breaths}/min
O2 Saturation: 98.7 %
PATIENT TEMPERATURE: 37.6
PEEP/CPAP: 5 cmH2O
PO2 ART: 138 mmHg — AB (ref 83.0–108.0)
VT: 620 mL
pCO2 arterial: 34.9 mmHg (ref 32.0–48.0)
pH, Arterial: 7.432 (ref 7.350–7.450)

## 2018-06-01 LAB — MAGNESIUM: MAGNESIUM: 2.2 mg/dL (ref 1.7–2.4)

## 2018-06-01 LAB — PHOSPHORUS: PHOSPHORUS: 3.7 mg/dL (ref 2.5–4.6)

## 2018-06-01 MED ORDER — TRACE MINERALS CR-CU-MN-SE-ZN 10-1000-500-60 MCG/ML IV SOLN
INTRAVENOUS | Status: AC
  Administered 2018-06-01: 18:00:00 via INTRAVENOUS
  Filled 2018-06-01: qty 1992

## 2018-06-01 MED ORDER — POTASSIUM CHLORIDE 10 MEQ/100ML IV SOLN
10.0000 meq | INTRAVENOUS | Status: AC
  Administered 2018-06-01 (×2): 10 meq via INTRAVENOUS
  Filled 2018-06-01: qty 100

## 2018-06-01 MED ORDER — FAT EMULSION PLANT BASED 20 % IV EMUL
250.0000 mL | INTRAVENOUS | Status: AC
  Administered 2018-06-01: 250 mL via INTRAVENOUS
  Filled 2018-06-01: qty 250

## 2018-06-01 NOTE — Progress Notes (Addendum)
[facility] NOTE   Pharmacy Consult for TPN Indication: NPO x 1 week, unable to eat/unable to place NG/OG   Patient Measurements: Height: 6' (182.9 cm) Weight: 182 lb 1.6 oz (82.6 kg) IBW/kg (Calculated) : 77.6 TPN AdjBW (KG): 80.5 Body mass index is 24.7 kg/m.  Assessment:  admitted 05/25/2018 with left sided weakness and acute right basal ganglia CVA. Shortly after admission, patient became increasingly lethargic and started having fevers. The patient developed respiratory distress and became unresponsive; subsequently, he was intubated. He decompensated secondary to aspiration PNA. Multiple attempts have been made to pass OG to provide medications/feeding but has failed and repeatedly gets coiled in his hiatal hernia. No nutrition for 1 week, plan is to place a PICC line and start TPN. BG with only slight increase. RT to attempt weaning today. Unable to meet full nutritional needs without lipids at this time. If patient is able to be extubated, maybe able to add lipids earlier.   GI: NPO, intubated and unable to Place feeding tube Insulin requirements in the past 24 hours: 14 units Lytes:stable.  K: 3.5   BS stable, continue to monitor with increase in TPN Renal: Stable Pulm:  Intubated/ Aspiration PNA Neuro: S/P CVA ID: [id number] WBC normalized, 1 of 2 BCX with CONS, likely contaminant.  Aspiration PNA TPN Access:  PICC line TPN start date: 05/30/2018 Nutritional Goals (per RD recommendation)=> Estimated Nutritional Needs:  Kcal:  2100 kcals (PSU 2003b) Protein:  121-138g Pro (1.5-1.7g/kg bw)  Current Nutrition: NPO  Plan:  TPN at 83 mL/hr. 20% lipid emulsion at 20 mL/hr for 12 hours This TPN provides 48 g of lipids, 100 g of protein, 299 g of dextrose which provides 1845 kCals per day Electrolytes in TPN: standard Potassium chloride 20 mEq IV for potassium of 3.5 Add MVI, trace elements, 0 units of regular insulin, and folic acid to  TPN Low dose SSI and adjust as needed Continue IVMF (D5W + 55meq KCL) at 20 ml/hr Monitor TPN labs  Margot Ables, PharmD Clinical Pharmacist 06/01/2018 11:52 AM

## 2018-06-01 NOTE — Care Management Note (Signed)
Case Management Note  Patient Details  Name: KELLIN BARTLING MRN: [id number] Date of Birth: 02-12-1936   If discussed at [facility] Length of Stay Meetings, dates discussed:  06/01/2018  Additional Comments:  Ireoluwa Gorsline, Chauncey Reading, RN 06/01/2018, 1:12 PM

## 2018-06-01 NOTE — Progress Notes (Signed)
PROGRESS NOTE    Keith Doyle  BMW:[id number] DOB: [birth year] DOA: 05/20/2018 PCP: Janora Norlander, DO   Brief Narrative:   82 y/o male admitted to the hospital withleft sided weakness andacute right basal ganglia CVA.Shortly after admission, patient became increasingly lethargic and began having fevers. This was followed by development of respiratory distress and he became unresponsive. He was intubated for airway protection and respiratory distress.His decompensation was secondary to aspiration pneumonia.Marland Kitchen He is being treated with IV antibiotics, but has been slow to progress. Main barrier to weaning at this point has been his secretions as well as tracheal edema. Pulmonology and neurology following. Plan is to continue ventilation for the next 2 to 3 days until family members decide whether or not he would like repeat intubation if needed versus tracheostomy.  Palliative careconsultedto help address goals of care.   Assessment & Plan:  Principal Problem: CVA (cerebral vascular accident) [facility]) Active Problems: Essential hypertension Peripheral neuropathy Hyperlipidemia Hypokalemia Diabetes mellitus with neuropathy (HCC) Paroxysmal atrial fibrillation (HCC) AKI (acute kidney injury) ([place]) Chronic diastolic congestive heart failure (HCC) Chronic anticoagulation Iron deficiency anemia due to chronic blood loss Acute respiratory failure (HCC) Aspiration pneumonia of both lower lobes due to gastric secretions (HCC) Malnutrition of moderate degree Pressure injury of skin   1. Acute CVA. Patient had presented with acute onset of LLE weakness. MRI brain confirmedright basal ganglia infarct. MRA head showedsome atherosclerotic disease on the left side. Carotid dopplers did not show any significant stenosis bilaterally. Echo did not show any significant findings. Seen by physical therapy who recommended SNF. He is chronically on xarelto, but  this has been switched to lovenox since he did not pass his swallow evaluation. Neurology has evaluated the patient. Patient has required minimal sedation on ventilatorandhas not beenvery responsive.Repeat CT head was unrevealing. EEGwith diffuse changes and no seizure activity noted. 2. Acute respiratory failure with hypoxia. Related to aspiration pneumonia.Shortly after admission, patient became unresponsive, febrile and developed respiratory distress. He was intubated for airway protection and respiratory distress on 9/18. He was startedon intravenous antibiotics and has not had recurrent high fever spikes, but continues to have low grade temps. He has had 1 out of 2 positive blood cultures for coagulase-negative staph which is suspected to bea contaminant. Pulmonology following. Lactic acid wasinitially elevated, but has since trended down. He will need to remain on the ventilator for now after discussion with Dr. Luan Pulling. Multiple attempts have been made to pass OG to provide medications/feeding. This repeatedly gets coiled in his hiatal hernia. Continue TPN via PICC line. Continue current treatments.Family members have to decide whether or not he would like reintubation versus tracheostomy.  In the meantime, plan is to continue ventilator management. 3. Paroxysmal atrial fibrillation on chronic Xarelto. Currently on therapeutic dose Lovenox. Heart rate is currently stable on lopressor. Can resume xarelto once acute issues have resolved. 4. HLD. Will resume statin once able to take p.o. medications. LDL is above goal considering he has had a stroke. 5. Peripheral neuropathy. Gabapentin was placed on hold due to altered mental status. 6. Bilateral lower extremity edema. Mild elevation of BNP. Echo shows normal EF. Initially had good urine output with Lasix, but further diuretics were held due to increasing creatinine 7. AKI. Likely related to diuretics. Improved with IV fluids.   8. Diabetes. Metformin currently on hold. Continue sliding scale insulin. Blood sugars have been stable 9. Goals of care.Palliative care consultationpending. Patient has multiple medical problems that are affecting his quality of  life. Mentally, he has not been responding well on the ventilator and has not been able to engage in weaning process. He also has significant swallowing issues which precipitated aspiration. It would be helpful to clearly identify goals and how patient would want to approach these difficult questions in terms of possible PEG tube. Certainly, family will have a difficult time coming to terms with this, especially since patient was so functional (independent/driving) prior to stroke.   DVT prophylaxis:Full dose Lovenox Code Status:full code Family Communication:discussed withson Teddyat the bedside Disposition Plan:Patient will remain in the ICU at this time for ventilator management, possible extubation in 2 to 3 days once family members decide whether or not they would like reintubation versus tracheostomy.  LTAC was entertained briefly today, however patient does not meet criteria per his insurance and would need a tracheostomy first.  DC Zosyn in 2 days.   Consultants:  Neurology  Pulmonology  Procedures: Echo: - Left ventricle: The cavity size was normal. Wall thickness was increased increased in a pattern of mild to moderate LVH. Systolic function was normal. The estimated ejection fraction was in the range of 60% to 65%. Wall motion was normal; there were no regional wall motion abnormalities. - Aortic valve: Mildly calcified annulus. Trileaflet. - Aortic root: The aortic root was mildly ectatic. - Mitral valve: There was trivial regurgitation. - Left atrium: The atrium was moderately dilated. - Right atrium: The atrium was mildly dilated. Central venous pressure (est): 8 mm Hg. - Atrial septum: No defect or patent foramen ovale  was identified. - Tricuspid valve: There was trivial regurgitation.  - Pericardium, extracardiac: There was no pericardial effusion.  Intubation 9/18 >  EEG on 9/23  Antimicrobials:   Zosyn 9/18 > 9/19  Unasyn 9/19 >9/23  Zosyn 9/23 (day5/7)->  Subjective: Patient seen and evaluated today with no acute events noted overnight.  He continues to remain on the ventilator with ongoing secretions.  Temperature has shown minimal improvement while on Zosyn.  Blood cultures continue to remain negative.  Family members have not decided what to do regarding reintubation versus tracheostomy.  Objective: Vitals:   06/01/18 0741 06/01/18 0832 06/01/18 0900 06/01/18 1103  BP:      Pulse: 88   (!) 45  Resp: 20   (!) 21  Temp: 99.7 F (37.6 C)   99.9 F (37.7 C)  TempSrc:      SpO2: 100% 100%  100%  Weight:      Height:   6' (1.829 m)     Intake/Output Summary (Last 24 hours) at 06/01/2018 1250 Last data filed at 06/01/2018 0747 Gross per 24 hour  Intake 2401.85 ml  Output 2200 ml  Net 201.85 ml   Filed Weights   05/30/18 0500 05/31/18 0435 06/01/18 0500  Weight: 81.8 kg 81.2 kg 82.6 kg    Examination:  General exam: Sedated on ventilator Respiratory system: Clear to auscultation. Respiratory effort normal.  On ventilator FiO2 30% Cardiovascular system: S1 & S2 heard, RRR. No JVD, murmurs, rubs, gallops or clicks. No pedal edema. Gastrointestinal system: Abdomen is nondistended, soft and nontender. No organomegaly or masses felt. Normal bowel sounds heard. Central nervous system: Cannot be assessed as patient is sedated Extremities: Symmetric 5 x 5 power. Skin: No rashes, lesions or ulcers Psychiatry: Cannot be assessed Foley with clear, yellow urine output    Data Reviewed: I have personally reviewed following labs and imaging studies  CBC: Recent Labs  Lab 05/27/18 0350 05/28/18 0551 05/29/18 0401 05/30/18  0165 05/31/18 0401  WBC 8.6 5.9 8.5 6.4 7.3   NEUTROABS  --   --   --  4.4  --   HGB 10.3* 9.5* 10.4* 9.0* 9.3*  HCT 34.3* 31.6* 34.8* 29.7* 30.5*  MCV 92.2 91.9 92.8 91.7 92.1  PLT 255 252 235 238 537   Basic Metabolic Panel: Recent Labs  Lab 05/26/18 1700 05/27/18 0350 05/27/18 1615 05/28/18 0551 05/29/18 0401 05/30/18 0337 05/31/18 0401 06/01/18 0411  NA  --  146*  --  149* 149* 146* 146* 146*  K  --  3.4*  --  3.4* 3.5 3.9 3.8 3.5  CL  --  114*  --  117* 117* 117* 115* 116*  CO2  --  25  --  24 23 21* 22 23  GLUCOSE  --  147*  --  139* 162* 150* 160* 165*  BUN  --  20  --  '17 15 15 17 20  ' CREATININE  --  1.12  --  0.90 0.90 0.92 0.94 0.76  CALCIUM  --  8.8*  --  9.0 9.1 8.7* 9.1 9.3  MG 2.1 2.1 2.0  --   --  2.2  --  2.2  PHOS 2.2* 2.5 2.6  --   --  3.3  --  3.7   GFR: Estimated Creatinine Clearance: 80.8 mL/min (by C-G formula based on SCr of 0.76 mg/dL). Liver Function Tests: Recent Labs  Lab 05/27/18 0350 05/30/18 0337 06/01/18 0411  AST '18 22 19  ' ALT '13 19 23  ' ALKPHOS 45 80 122  BILITOT 0.6 0.6 0.3  PROT 6.3* 5.6* 6.0*  ALBUMIN 3.1* 2.5* 2.5*   No results for input(s): LIPASE, AMYLASE in the last 168 hours. No results for input(s): AMMONIA in the last 168 hours. Coagulation Profile: No results for input(s): INR, PROTIME in the last 168 hours. Cardiac Enzymes: No results for input(s): CKTOTAL, CKMB, CKMBINDEX, TROPONINI in the last 168 hours. BNP (last 3 results) No results for input(s): PROBNP in the last 8760 hours. HbA1C: No results for input(s): HGBA1C in the last 72 hours. CBG: Recent Labs  Lab 05/31/18 1940 06/01/18 0021 06/01/18 0410 06/01/18 0746 06/01/18 1102  GLUCAP 140* 150* 139* 172* 191*   Lipid Profile: Recent Labs    05/30/18 0337  TRIG 198*   Thyroid Function Tests: No results for input(s): TSH, T4TOTAL, FREET4, T3FREE, THYROIDAB in the last 72 hours. Anemia Panel: No results for input(s): VITAMINB12, FOLATE, FERRITIN, TIBC, IRON, RETICCTPCT in the last 72  hours. Sepsis Labs: No results for input(s): PROCALCITON, LATICACIDVEN in the last 168 hours.  Recent Results (from the past 240 hour(s))  Culture, blood (routine x 2)     Status: Abnormal   Collection Time: 05/24/18  6:54 PM  Result Value Ref Range Status   Specimen Description   Final    BLOOD RIGHT HAND Performed at [facility], [address] [place], [place] [address]    Special Requests   Final    BOTTLES DRAWN AEROBIC AND ANAEROBIC Blood Culture adequate volume Performed at [facility], [address] [place], [place] [address]    Culture  Setup Time   Final    GRAM POSITIVE COCCI ANAEROBIC BOTTLE GRAM STAINED RESULT CALLED TO/READ BACK FROM MISTY SMART AT 1300  BY HFLYNT 05/25/18 CRITICAL RESULT CALLED TO, READ BACK BY AND VERIFIED WITH: RN E MURPHY [phone] 1824 MLM    Culture (A)  Final    STAPHYLOCOCCUS SPECIES (COAGULASE NEGATIVE) THE SIGNIFICANCE OF  ISOLATING THIS ORGANISM FROM A SINGLE SET OF BLOOD CULTURES WHEN MULTIPLE SETS ARE DRAWN IS UNCERTAIN. PLEASE NOTIFY THE MICROBIOLOGY DEPARTMENT WITHIN ONE WEEK IF SPECIATION AND SENSITIVITIES ARE REQUIRED. Performed at [facility], [place] [address], [place], [place] [address]    Report Status 05/27/2018 FINAL  Final  Blood Culture ID Panel (Reflexed)     Status: Abnormal   Collection Time: 05/24/18  6:54 PM  Result Value Ref Range Status   Enterococcus species NOT DETECTED NOT DETECTED Final   Listeria monocytogenes NOT DETECTED NOT DETECTED Final   Staphylococcus species DETECTED (A) NOT DETECTED Final    Comment: Methicillin (oxacillin) resistant coagulase negative staphylococcus. Possible blood culture contaminant (unless isolated from more than one blood culture draw or clinical case suggests pathogenicity). No antibiotic treatment is indicated for blood  culture contaminants. CRITICAL RESULT CALLED TO, READ BACK BY AND VERIFIED WITH: RN E MURPHY [phone] 1824 MLM    Staphylococcus aureus NOT DETECTED NOT  DETECTED Final   Methicillin resistance DETECTED (A) NOT DETECTED Final    Comment: CRITICAL RESULT CALLED TO, READ BACK BY AND VERIFIED WITH: RN E MURPHY [phone] 1824 MLM    Streptococcus species NOT DETECTED NOT DETECTED Final   Streptococcus agalactiae NOT DETECTED NOT DETECTED Final   Streptococcus pneumoniae NOT DETECTED NOT DETECTED Final   Streptococcus pyogenes NOT DETECTED NOT DETECTED Final   Acinetobacter baumannii NOT DETECTED NOT DETECTED Final   Enterobacteriaceae species NOT DETECTED NOT DETECTED Final   Enterobacter cloacae complex NOT DETECTED NOT DETECTED Final   Escherichia coli NOT DETECTED NOT DETECTED Final   Klebsiella oxytoca NOT DETECTED NOT DETECTED Final   Klebsiella pneumoniae NOT DETECTED NOT DETECTED Final   Proteus species NOT DETECTED NOT DETECTED Final   Serratia marcescens NOT DETECTED NOT DETECTED Final   Haemophilus influenzae NOT DETECTED NOT DETECTED Final   Neisseria meningitidis NOT DETECTED NOT DETECTED Final   Pseudomonas aeruginosa NOT DETECTED NOT DETECTED Final   Candida albicans NOT DETECTED NOT DETECTED Final   Candida glabrata NOT DETECTED NOT DETECTED Final   Candida krusei NOT DETECTED NOT DETECTED Final   Candida parapsilosis NOT DETECTED NOT DETECTED Final   Candida tropicalis NOT DETECTED NOT DETECTED Final    Comment: Performed at [facility], [address], [place], [place] [address]  Culture, blood (routine x 2)     Status: None   Collection Time: 05/24/18  7:03 PM  Result Value Ref Range Status   Specimen Description BLOOD LEFT HAND  Final   Special Requests   Final    BOTTLES DRAWN AEROBIC AND ANAEROBIC Blood Culture adequate volume   Culture   Final    NO GROWTH 5 DAYS Performed at [facility], [address] [place], [place] [address]    Report Status 05/29/2018 FINAL  Final  MRSA PCR Screening     Status: None   Collection Time: 05/25/18  8:34 PM  Result Value Ref Range Status   MRSA by PCR NEGATIVE NEGATIVE  Final    Comment:        The GeneXpert MRSA Assay (FDA approved for NASAL specimens only), is one component of a comprehensive MRSA colonization surveillance program. It is not intended to diagnose MRSA infection nor to guide or monitor treatment for MRSA infections. Performed at [facility], [address], [place], [place] [address]   Culture, blood (routine x 2)     Status: None (Preliminary result)   Collection Time: 05/30/18  3:10  PM  Result Value Ref Range Status   Specimen Description BLOOD LEFT ANTECUBITAL  Final   Special Requests   Final    BOTTLES DRAWN AEROBIC AND ANAEROBIC Blood Culture adequate volume   Culture   Final    NO GROWTH 2 DAYS Performed at [facility], [address] [place], [place] [address]    Report Status PENDING  Incomplete  Culture, blood (routine x 2)     Status: None (Preliminary result)   Collection Time: 05/30/18  3:47 PM  Result Value Ref Range Status   Specimen Description BLOOD LEFT FOREARM  Final   Special Requests   Final    BOTTLES DRAWN AEROBIC AND ANAEROBIC Blood Culture adequate volume   Culture   Final    NO GROWTH 2 DAYS Performed at [facility], [address], [place], [place] [address]    Report Status PENDING  Incomplete         Radiology Studies: Dg Chest Port 1 View  Result Date: 05/31/2018 CLINICAL DATA:  Respiratory failure. EXAM: PORTABLE CHEST 1 VIEW COMPARISON:  05/30/2018. FINDINGS: Endotracheal tube in satisfactory position. Right PICC tip in the superior vena cava, 3 cm above the superior cavoatrial junction. Mild left basilar atelectasis with a more linear component today. Clear right lung. Mild peribronchial thickening. Normal sized heart with a mild decrease in size. Moderately large hiatal hernia. IMPRESSION: 1. Mild left basilar atelectasis and mild bronchitic changes. 2. Moderately large hiatal hernia. Electronically Signed   By: Claudie Revering M.D.   On: 05/31/2018 10:56   Dg Chest Port 1 View  Result  Date: 05/30/2018 CLINICAL DATA:  Status post PICC line placement. EXAM: PORTABLE CHEST 1 VIEW COMPARISON:  Portable chest x-ray of May 29, 2018 FINDINGS: The right-sided PICC line has its tip terminating in the region of the midportion of the SVC. No immediate postprocedure complication is observed. The lungs are adequately inflated. The interstitial markings remain coarse. There is hazy increased density at the left lung base which is not new and has in fact improved since yesterday's study. The heart and pulmonary vascularity are normal. The endotracheal tube tip projects approximately 5.2 cm above the carina. IMPRESSION: Good positioning of the right PICC line with the tip projecting over the midportion of the SVC. Slight improvement in the appearance of left basilar atelectasis or pneumonia. Electronically Signed   By: David  Martinique M.D.   On: 05/30/2018 15:01        Scheduled Meds: . aspirin  300 mg Rectal Daily  . atorvastatin  80 mg Per Tube q1800  . chlorhexidine gluconate (MEDLINE KIT)  15 mL Mouth Rinse BID  . Chlorhexidine Gluconate Cloth  6 each Topical Daily  . Chlorhexidine Gluconate Cloth  6 each Topical Daily  . enoxaparin (LOVENOX) injection  80 mg Subcutaneous Q12H  . insulin aspart  0-15 Units Subcutaneous Q4H  . ipratropium-albuterol  3 mL Nebulization Q6H  . mouth rinse  15 mL Mouth Rinse 10 times per day  . metoprolol tartrate  5 mg Intravenous Q6H  . sodium chloride flush  10-40 mL Intracatheter Q12H  . sodium chloride flush  3 mL Intravenous Q12H   Continuous Infusions: . Marland KitchenTPN (CLINIMIX-E) Adult 80 mL/hr at 06/01/18 0747  . Marland KitchenTPN (CLINIMIX-E) Adult    . sodium chloride 250 mL (05/31/18 1112)  . dextrose 5 % 1,000 mL with potassium chloride 40 mEq infusion 20 mL/hr at 06/01/18 0747  . famotidine (PEPCID) IV 20 mg (05/31/18 1027)  .  piperacillin-tazobactam (ZOSYN)  IV 12.5 mL/hr at 06/01/18 0747  . potassium chloride       LOS: 9 days    Time spent: 30  minutes    Anyssa Sharpless Darleen Crocker, DO [facility] Pager [phone]  If 7PM-7AM, please contact night-coverage [email or web address] Password [facility] 06/01/2018, 12:50 PM

## 2018-06-01 NOTE — Progress Notes (Signed)
Subjective: He remains intubated and on the ventilator.  He is doing okay mechanically with breathing when we wean him but the concern is whether he will be able to protect his airway.  I did not extubate yesterday because of these concerns.  We are trying to get some clarity from family about whether we would plan to reintubate if needed.  Objective: Vital signs in last 24 hours: Temp:  [99.5 F (37.5 C)-100.4 F (38 C)] 99.7 F (37.6 C) (09/26 0741) Pulse Rate:  [44-103] 88 (09/26 0741) Resp:  [14-28] 20 (09/26 0741) BP: (116-162)/(70-131) 135/77 (09/26 0730) SpO2:  [96 %-100 %] 100 % (09/26 0832) FiO2 (%):  [30 %-40 %] 30 % (09/26 0832) Weight:  [82.6 kg] 82.6 kg (09/26 0500) Weight change: 1.4 kg Last BM Date: (Unkown pt also NPO)  Intake/Output from previous day: 09/25 0701 - 09/26 0700 In: 960.5 [I.V.:856.8; IV Piggyback:103.7] Out: 2200 [Urine:2200]  PHYSICAL EXAM General appearance: Intubated sleeping on mechanical ventilation Resp: rhonchi bilaterally Cardio: irregularly irregular rhythm GI: soft, non-tender; bowel sounds normal; no masses,  no organomegaly Extremities: extremities normal, atraumatic, no cyanosis or edema  Lab Results:  Results for orders placed or performed during the hospital encounter of 05/21/2018 (from the past 48 hour(s))  Glucose, capillary     Status: Abnormal   Collection Time: 05/30/18 11:10 AM  Result Value Ref Range   Glucose-Capillary 108 (H) 70 - 99 mg/dL  Culture, blood (routine x 2)     Status: None (Preliminary result)   Collection Time: 05/30/18  3:10 PM  Result Value Ref Range   Specimen Description BLOOD LEFT ANTECUBITAL    Special Requests      BOTTLES DRAWN AEROBIC AND ANAEROBIC Blood Culture adequate volume   Culture      NO GROWTH 2 DAYS Performed at [facility], [address], [place], [place] [address]    Report Status PENDING   Culture, blood (routine x 2)     Status: None (Preliminary result)   Collection Time:  05/30/18  3:47 PM  Result Value Ref Range   Specimen Description BLOOD LEFT FOREARM    Special Requests      BOTTLES DRAWN AEROBIC AND ANAEROBIC Blood Culture adequate volume   Culture      NO GROWTH 2 DAYS Performed at [facility], [address] [place], [place] [address]    Report Status PENDING   Glucose, capillary     Status: Abnormal   Collection Time: 05/30/18  4:17 PM  Result Value Ref Range   Glucose-Capillary 124 (H) 70 - 99 mg/dL  Glucose, capillary     Status: Abnormal   Collection Time: 05/30/18  7:49 PM  Result Value Ref Range   Glucose-Capillary 144 (H) 70 - 99 mg/dL  Glucose, capillary     Status: Abnormal   Collection Time: 05/31/18 12:23 AM  Result Value Ref Range   Glucose-Capillary 156 (H) 70 - 99 mg/dL  Basic metabolic panel     Status: Abnormal   Collection Time: 05/31/18  4:01 AM  Result Value Ref Range   Sodium 146 (H) 135 - 145 mmol/L   Potassium 3.8 3.5 - 5.1 mmol/L   Chloride 115 (H) 98 - 111 mmol/L   CO2 22 22 - 32 mmol/L   Glucose, Bld 160 (H) 70 - 99 mg/dL   BUN 17 8 - 23 mg/dL   Creatinine, Ser 0.94 0.61 - 1.24 mg/dL   Calcium 9.1 8.9 - 10.3 mg/dL   GFR  calc non Af Amer >60 >60 mL/min   GFR calc Af Amer >60 >60 mL/min    Comment: (NOTE) The eGFR has been calculated using the CKD EPI equation. This calculation has not been validated in all clinical situations. eGFR's persistently <60 mL/min signify possible Chronic Kidney Disease.    Anion gap 9 5 - 15    Comment: Performed at [facility], [address], [place], [place] [address]  CBC     Status: Abnormal   Collection Time: 05/31/18  4:01 AM  Result Value Ref Range   WBC 7.3 4.0 - 10.5 K/uL   RBC 3.31 (L) 4.22 - 5.81 MIL/uL   Hemoglobin 9.3 (L) 13.0 - 17.0 g/dL   HCT 30.5 (L) 39.0 - 52.0 %   MCV 92.1 78.0 - 100.0 fL   MCH 28.1 26.0 - 34.0 pg   MCHC 30.5 30.0 - 36.0 g/dL   RDW 16.5 (H) 11.5 - 15.5 %   Platelets 264 150 - 400 K/uL    Comment: Performed at [facility], [address], [place], [place] [address]  Blood gas, arterial     Status: Abnormal   Collection Time: 05/31/18  4:10 AM  Result Value Ref Range   FIO2 30.00    Delivery systems VENTILATOR    Mode PRESSURE REGULATED VOLUME CONTROL    VT 620 mL   LHR 18.0 resp/min   Peep/cpap 5.0 cm H20   pH, Arterial 7.447 7.350 - 7.450   pCO2 arterial 33.0 32.0 - 48.0 mmHg   pO2, Arterial 111.0 (H) 83.0 - 108.0 mmHg   Bicarbonate 24.1 20.0 - 28.0 mmol/L   Acid-base deficit 1.2 0.0 - 2.0 mmol/L   O2 Saturation 97.9 %   Patient temperature 38.0    Collection site LEFT RADIAL    Drawn by [id number]    Sample type ARTERIAL    Allens test (pass/fail) PASS PASS    Comment: Performed at [facility], [address], [place], [place] [address]  Glucose, capillary     Status: Abnormal   Collection Time: 05/31/18  4:27 AM  Result Value Ref Range   Glucose-Capillary 147 (H) 70 - 99 mg/dL  Glucose, capillary     Status: Abnormal   Collection Time: 05/31/18  7:27 AM  Result Value Ref Range   Glucose-Capillary 156 (H) 70 - 99 mg/dL  Blood gas, arterial     Status: Abnormal   Collection Time: 05/31/18 10:20 AM  Result Value Ref Range   FIO2 40.00    Delivery systems VENTILATOR    Mode CONTINUOUS POSITIVE AIRWAY PRESSURE    Peep/cpap 5.0 cm H20   Pressure support 5 cm H20   pH, Arterial 7.449 7.350 - 7.450   pCO2 arterial 33.2 32.0 - 48.0 mmHg   pO2, Arterial 157.0 (H) 83.0 - 108.0 mmHg   Bicarbonate 24.3 20.0 - 28.0 mmol/L   Acid-base deficit 0.8 0.0 - 2.0 mmol/L   O2 Saturation 99.2 %   Collection site LEFT RADIAL    Drawn by [id number]    Sample type ARTERIAL DRAW    Allens test (pass/fail) PASS PASS    Comment: Performed at [facility], [address] [place], [place] [address]  Glucose, capillary     Status: Abnormal   Collection Time: 05/31/18 11:08 AM  Result Value Ref Range   Glucose-Capillary 138 (H) 70 - 99 mg/dL  Glucose, capillary     Status: Abnormal   Collection Time: 05/31/18  4:14 PM  Result Value  Ref Range   Glucose-Capillary 153 (H) 70 - 99 mg/dL   Comment 1 Notify RN   Glucose, capillary     Status: Abnormal   Collection Time: 05/31/18  7:40 PM  Result Value Ref Range   Glucose-Capillary 140 (H) 70 - 99 mg/dL  Glucose, capillary     Status: Abnormal   Collection Time: 06/01/18  4:10 AM  Result Value Ref Range   Glucose-Capillary 139 (H) 70 - 99 mg/dL  Comprehensive metabolic panel     Status: Abnormal   Collection Time: 06/01/18  4:11 AM  Result Value Ref Range   Sodium 146 (H) 135 - 145 mmol/L   Potassium 3.5 3.5 - 5.1 mmol/L   Chloride 116 (H) 98 - 111 mmol/L   CO2 23 22 - 32 mmol/L   Glucose, Bld 165 (H) 70 - 99 mg/dL   BUN 20 8 - 23 mg/dL   Creatinine, Ser 0.76 0.61 - 1.24 mg/dL   Calcium 9.3 8.9 - 10.3 mg/dL   Total Protein 6.0 (L) 6.5 - 8.1 g/dL   Albumin 2.5 (L) 3.5 - 5.0 g/dL   AST 19 15 - 41 U/L   ALT 23 0 - 44 U/L   Alkaline Phosphatase 122 38 - 126 U/L   Total Bilirubin 0.3 0.3 - 1.2 mg/dL   GFR calc non Af Amer >60 >60 mL/min   GFR calc Af Amer >60 >60 mL/min    Comment: (NOTE) The eGFR has been calculated using the CKD EPI equation. This calculation has not been validated in all clinical situations. eGFR's persistently <60 mL/min signify possible Chronic Kidney Disease.    Anion gap 7 5 - 15    Comment: Performed at [facility], [address] [place], [place] [address]  Magnesium     Status: None   Collection Time: 06/01/18  4:11 AM  Result Value Ref Range   Magnesium 2.2 1.7 - 2.4 mg/dL    Comment: Performed at [facility], [address], [place], [place] [address]  Phosphorus     Status: None   Collection Time: 06/01/18  4:11 AM  Result Value Ref Range   Phosphorus 3.7 2.5 - 4.6 mg/dL    Comment: Performed at [facility], [address], [place], [place] [address]  Blood gas, arterial     Status: Abnormal   Collection Time: 06/01/18  6:10 AM  Result Value Ref Range   FIO2 30.00    Delivery systems VENTILATOR    Mode PRESSURE REGULATED  VOLUME CONTROL    VT 620 mL   LHR 18.0 resp/min   Peep/cpap 5.0 cm H20   pH, Arterial 7.432 7.350 - 7.450   pCO2 arterial 34.9 32.0 - 48.0 mmHg   pO2, Arterial 138.0 (H) 83.0 - 108.0 mmHg   Bicarbonate 24.2 20.0 - 28.0 mmol/L   Acid-base deficit 0.9 0.0 - 2.0 mmol/L   O2 Saturation 98.7 %   Patient temperature 37.6    Collection site LEFT RADIAL    Drawn by [id number]    Sample type ARTERIAL    Allens test (pass/fail) PASS PASS    Comment: Performed at [facility], [address], [place],  [address]  Glucose, capillary     Status: Abnormal   Collection Time: 06/01/18  7:46 AM  Result Value Ref Range   Glucose-Capillary 172 (H) 70 - 99 mg/dL    ABGS Recent Labs    06/01/18 0610  PHART 7.432  PO2ART 138.0*  HCO3 24.2   CULTURES  Recent Results (from the past 240 hour(s))  Culture, blood (routine x 2)     Status: Abnormal   Collection Time: 05/24/18  6:54 PM  Result Value Ref Range Status   Specimen Description   Final    BLOOD RIGHT HAND Performed at [facility], [address], [place], [place] [address]    Special Requests   Final    BOTTLES DRAWN AEROBIC AND ANAEROBIC Blood Culture adequate volume Performed at [facility], [address] [place], [place] [address]    Culture  Setup Time   Final    GRAM POSITIVE COCCI ANAEROBIC BOTTLE GRAM STAINED RESULT CALLED TO/READ BACK FROM MISTY SMART AT 1300  BY HFLYNT 05/25/18 CRITICAL RESULT CALLED TO, READ BACK BY AND VERIFIED WITH: RN E MURPHY [phone] 1824 MLM    Culture (A)  Final    STAPHYLOCOCCUS SPECIES (COAGULASE NEGATIVE) THE SIGNIFICANCE OF ISOLATING THIS ORGANISM FROM A SINGLE SET OF BLOOD CULTURES WHEN MULTIPLE SETS ARE DRAWN IS UNCERTAIN. PLEASE NOTIFY THE MICROBIOLOGY DEPARTMENT WITHIN ONE WEEK IF SPECIATION AND SENSITIVITIES ARE REQUIRED. Performed at [facility], [address], [place], [place] [address]    Report Status 05/27/2018 FINAL  Final  Blood Culture ID Panel (Reflexed)     Status:  Abnormal   Collection Time: 05/24/18  6:54 PM  Result Value Ref Range Status   Enterococcus species NOT DETECTED NOT DETECTED Final   Listeria monocytogenes NOT DETECTED NOT DETECTED Final   Staphylococcus species DETECTED (A) NOT DETECTED Final    Comment: Methicillin (oxacillin) resistant coagulase negative staphylococcus. Possible blood culture contaminant (unless isolated from more than one blood culture draw or clinical case suggests pathogenicity). No antibiotic treatment is indicated for blood  culture contaminants. CRITICAL RESULT CALLED TO, READ BACK BY AND VERIFIED WITH: RN E MURPHY [phone] 1824 MLM    Staphylococcus aureus NOT DETECTED NOT DETECTED Final   Methicillin resistance DETECTED (A) NOT DETECTED Final    Comment: CRITICAL RESULT CALLED TO, READ BACK BY AND VERIFIED WITH: RN E MURPHY [phone] 1824 MLM    Streptococcus species NOT DETECTED NOT DETECTED Final   Streptococcus agalactiae NOT DETECTED NOT DETECTED Final   Streptococcus pneumoniae NOT DETECTED NOT DETECTED Final   Streptococcus pyogenes NOT DETECTED NOT DETECTED Final   Acinetobacter baumannii NOT DETECTED NOT DETECTED Final   Enterobacteriaceae species NOT DETECTED NOT DETECTED Final   Enterobacter cloacae complex NOT DETECTED NOT DETECTED Final   Escherichia coli NOT DETECTED NOT DETECTED Final   Klebsiella oxytoca NOT DETECTED NOT DETECTED Final   Klebsiella pneumoniae NOT DETECTED NOT DETECTED Final   Proteus species NOT DETECTED NOT DETECTED Final   Serratia marcescens NOT DETECTED NOT DETECTED Final   Haemophilus influenzae NOT DETECTED NOT DETECTED Final   Neisseria meningitidis NOT DETECTED NOT DETECTED Final   Pseudomonas aeruginosa NOT DETECTED NOT DETECTED Final   Candida albicans NOT DETECTED NOT DETECTED Final   Candida glabrata NOT DETECTED NOT DETECTED Final   Candida krusei NOT DETECTED NOT DETECTED Final   Candida parapsilosis NOT DETECTED NOT DETECTED Final   Candida tropicalis NOT  DETECTED NOT DETECTED Final    Comment: Performed at [facility], [address], [place],  [address]  Culture, blood (routine x 2)     Status: None   Collection Time: 05/24/18  7:03 PM  Result Value Ref Range Status   Specimen Description BLOOD LEFT HAND  Final   Special Requests   Final    BOTTLES  DRAWN AEROBIC AND ANAEROBIC Blood Culture adequate volume   Culture   Final    NO GROWTH 5 DAYS Performed at [facility], [address], [place], [place] [address]    Report Status 05/29/2018 FINAL  Final  MRSA PCR Screening     Status: None   Collection Time: 05/25/18  8:34 PM  Result Value Ref Range Status   MRSA by PCR NEGATIVE NEGATIVE Final    Comment:        The GeneXpert MRSA Assay (FDA approved for NASAL specimens only), is one component of a comprehensive MRSA colonization surveillance program. It is not intended to diagnose MRSA infection nor to guide or monitor treatment for MRSA infections. Performed at [facility], [address], [place], [place] [address]   Culture, blood (routine x 2)     Status: None (Preliminary result)   Collection Time: 05/30/18  3:10 PM  Result Value Ref Range Status   Specimen Description BLOOD LEFT ANTECUBITAL  Final   Special Requests   Final    BOTTLES DRAWN AEROBIC AND ANAEROBIC Blood Culture adequate volume   Culture   Final    NO GROWTH 2 DAYS Performed at [facility], [address], [place], [place] [address]    Report Status PENDING  Incomplete  Culture, blood (routine x 2)     Status: None (Preliminary result)   Collection Time: 05/30/18  3:47 PM  Result Value Ref Range Status   Specimen Description BLOOD LEFT FOREARM  Final   Special Requests   Final    BOTTLES DRAWN AEROBIC AND ANAEROBIC Blood Culture adequate volume   Culture   Final    NO GROWTH 2 DAYS Performed at [facility], [address], [place], [place] [address]    Report Status PENDING  Incomplete   Studies/Results: Dg Chest Port 1  View  Result Date: 05/31/2018 CLINICAL DATA:  Respiratory failure. EXAM: PORTABLE CHEST 1 VIEW COMPARISON:  05/30/2018. FINDINGS: Endotracheal tube in satisfactory position. Right PICC tip in the superior vena cava, 3 cm above the superior cavoatrial junction. Mild left basilar atelectasis with a more linear component today. Clear right lung. Mild peribronchial thickening. Normal sized heart with a mild decrease in size. Moderately large hiatal hernia. IMPRESSION: 1. Mild left basilar atelectasis and mild bronchitic changes. 2. Moderately large hiatal hernia. Electronically Signed   By: Claudie Revering M.D.   On: 05/31/2018 10:56   Dg Chest Port 1 View  Result Date: 05/30/2018 CLINICAL DATA:  Status post PICC line placement. EXAM: PORTABLE CHEST 1 VIEW COMPARISON:  Portable chest x-ray of May 29, 2018 FINDINGS: The right-sided PICC line has its tip terminating in the region of the midportion of the SVC. No immediate postprocedure complication is observed. The lungs are adequately inflated. The interstitial markings remain coarse. There is hazy increased density at the left lung base which is not new and has in fact improved since yesterday's study. The heart and pulmonary vascularity are normal. The endotracheal tube tip projects approximately 5.2 cm above the carina. IMPRESSION: Good positioning of the right PICC line with the tip projecting over the midportion of the SVC. Slight improvement in the appearance of left basilar atelectasis or pneumonia. Electronically Signed   By: David  Martinique M.D.   On: 05/30/2018 15:01    Medications:  Prior to Admission:  Medications Prior to Admission  Medication Sig Dispense Refill Last Dose  . Acetaminophen 500 MG coapsule Take 500 mg by mouth as needed for pain.  unknown  . amLODipine (NORVASC) 5 MG tablet TAKE 1 TABLET BY MOUTH DAILY. (Patient taking differently: Take 5 mg by mouth daily. ) 90 tablet 1 05/29/2018 at Unknown time  . atorvastatin (LIPITOR) 40  MG tablet Take 1 tablet (40 mg total) by mouth daily. 90 tablet 1 05/12/2018 at Unknown time  . benazepril (LOTENSIN) 40 MG tablet Take 1 tablet (40 mg total) by mouth 2 (two) times daily. 180 tablet 1 06/01/2018 at Unknown time  . betamethasone dipropionate 0.05 % lotion Apply topically 2 (two) times daily. (Patient taking differently: Apply 1 application topically 2 (two) times daily as needed (for irritation). ) 120 mL 0 unknown  . colchicine 0.6 MG tablet Take 1 tablet (0.6 mg total) by mouth daily. Continue daily until you have had no pain in your foot for 2 days.  Then stop. (Patient taking differently: Take 0.6 mg by mouth daily as needed (for gout pain as directed). Continue daily until you have had no pain in your foot for 2 days.  Then stop.) 20 tablet 0 unknown  . fenofibrate 160 MG tablet TAKE 1 TABLET EVERY DAY (Patient taking differently: Take 160 mg by mouth daily. ) 90 tablet 0 05/31/2018 at Unknown time  . furosemide (LASIX) 20 MG tablet TAKE 1 TABLET TWICE DAILY (Patient taking differently: Take 20 mg by mouth 2 (two) times daily. TAKE 1 TABLET TWICE DAILY) 180 tablet 1 05/12/2018 at Unknown time  . gabapentin (NEURONTIN) 400 MG capsule TAKE 2 CAPSULES BY MOUTH 2 (TWO) TIMES DAILY. (Patient taking differently: Take 800 mg by mouth 2 (two) times daily. ) 360 capsule 0 05/19/2018 at Unknown time  . metFORMIN (GLUCOPHAGE) 500 MG tablet Take 500 mg by mouth 2 (two) times daily with a meal.    05/24/2018 at Unknown time  . potassium chloride SA (K-DUR,KLOR-CON) 20 MEQ tablet TAKE 2 TABLETS TWICE DAILY (Patient taking differently: Take 40 mEq by mouth 2 (two) times daily. ) 360 tablet 1 05/15/2018 at Unknown time  . rivaroxaban (XARELTO) 20 MG TABS tablet Take 20 mg by mouth daily with supper.    05/25/2018 at Unknown time   Scheduled: . aspirin  300 mg Rectal Daily  . atorvastatin  80 mg Per Tube q1800  . chlorhexidine gluconate (MEDLINE KIT)  15 mL Mouth Rinse BID  . Chlorhexidine Gluconate  Cloth  6 each Topical Daily  . Chlorhexidine Gluconate Cloth  6 each Topical Daily  . enoxaparin (LOVENOX) injection  80 mg Subcutaneous Q12H  . insulin aspart  0-15 Units Subcutaneous Q4H  . ipratropium-albuterol  3 mL Nebulization Q6H  . mouth rinse  15 mL Mouth Rinse 10 times per day  . metoprolol tartrate  5 mg Intravenous Q6H  . sodium chloride flush  10-40 mL Intracatheter Q12H  . sodium chloride flush  3 mL Intravenous Q12H   Continuous: . Marland KitchenTPN (CLINIMIX-E) Adult 80 mL/hr at 06/01/18 0747  . sodium chloride 250 mL (05/31/18 1112)  . dextrose 5 % 1,000 mL with potassium chloride 40 mEq infusion 20 mL/hr at 06/01/18 0747  . famotidine (PEPCID) IV 20 mg (05/31/18 1027)  . piperacillin-tazobactam (ZOSYN)  IV 12.5 mL/hr at 06/01/18 0747   KKX:FGHWEX chloride, acetaminophen **OR** acetaminophen, albuterol, docusate, fentaNYL (SUBLIMAZE) injection, fentaNYL (SUBLIMAZE) injection, hydrALAZINE, midazolam, midazolam, ondansetron **OR** ondansetron (ZOFRAN) IV, polyvinyl alcohol, sodium chloride flush, sodium chloride flush, sodium chloride flush  Assesment: He was admitted with a stroke.  He initially did fairly well then developed increasing somnolence difficulty protecting his  airway and then had respiratory distress and likely aspirated.  This culminated in him being intubated and placed on mechanical ventilation and he has remained on the ventilator since.  He is not totally clear whether he can protect his airway or not.  He does seem to have made some recovery from his stroke and is communicating with nods and blinking his eyes.  He has atrial fib which is well controlled at this point and he is anticoagulated  He has malnutrition and was started on TPN because we cannot obtain access from gastric tube because it coils up in a large hiatal hernia  He had acute kidney injury which is better  He has diabetes with neuropathy which is unchanged and he is on sliding scale Principal  Problem:   CVA (cerebral vascular accident) [facility]) Active Problems:   Essential hypertension   Peripheral neuropathy   Hyperlipidemia   Hypokalemia   Diabetes mellitus with neuropathy (HCC)   Paroxysmal atrial fibrillation (HCC)   AKI (acute kidney injury) ([place])   Chronic diastolic congestive heart failure (HCC)   Chronic anticoagulation   Iron deficiency anemia due to chronic blood loss   Acute respiratory failure (HCC)   Aspiration pneumonia of both lower lobes due to gastric secretions (HCC)   Malnutrition of moderate degree   Pressure injury of skin   Goals of care, counseling/discussion   Palliative care by specialist   DNR (do not resuscitate) discussion   Protein-calorie malnutrition, severe    Plan: Continue treatments.  Working on trying to get some clarity from his family.  At this point they would plan to reintubate so I think we need to delay and give him some time to get better.  He was a somewhat difficult intubation and since that time he has had some swelling in his pharynx because of multiple attempts at OG tube.  If he comes to the 2-week.  With endotracheal tube in place I would plan to extubate at that point and see how he does rather than proceeding directly to tracheostomy    LOS: 9 days   HAWKINS,EDWARD L 06/01/2018, 8:37 AM

## 2018-06-01 NOTE — Progress Notes (Signed)
Patient with increased RR 30-32. RT placed patient back on rest mode of previous ventilator settings. RT will attempt to wean later once he is more alert.No complications noted at this time,  HR 98, RR 26 and SATs 100%. RT will continue to monitor

## 2018-06-01 NOTE — Care Management (Addendum)
Attending aksing about LTACH benefits. He will discuss with family to see if they are interested.   Patient discussed at [place] calls, [facility] LTACH rep present, reports that patient will not have LTACH benefits until he has tracheostomy.   Notified attending. It appears family is not interested in LTACH at this time.

## 2018-06-02 ENCOUNTER — Other Ambulatory Visit: Payer: Self-pay

## 2018-06-02 DIAGNOSIS — I10 Essential (primary) hypertension: Secondary | ICD-10-CM

## 2018-06-02 DIAGNOSIS — J690 Pneumonitis due to inhalation of food and vomit: Secondary | ICD-10-CM

## 2018-06-02 DIAGNOSIS — I6340 Cerebral infarction due to embolism of unspecified cerebral artery: Principal | ICD-10-CM

## 2018-06-02 DIAGNOSIS — Z7901 Long term (current) use of anticoagulants: Secondary | ICD-10-CM

## 2018-06-02 DIAGNOSIS — E43 Unspecified severe protein-calorie malnutrition: Secondary | ICD-10-CM

## 2018-06-02 DIAGNOSIS — I5032 Chronic diastolic (congestive) heart failure: Secondary | ICD-10-CM

## 2018-06-02 DIAGNOSIS — N179 Acute kidney failure, unspecified: Secondary | ICD-10-CM

## 2018-06-02 DIAGNOSIS — J9601 Acute respiratory failure with hypoxia: Secondary | ICD-10-CM

## 2018-06-02 DIAGNOSIS — E1140 Type 2 diabetes mellitus with diabetic neuropathy, unspecified: Secondary | ICD-10-CM

## 2018-06-02 DIAGNOSIS — E876 Hypokalemia: Secondary | ICD-10-CM

## 2018-06-02 DIAGNOSIS — R739 Hyperglycemia, unspecified: Secondary | ICD-10-CM | POA: Diagnosis present

## 2018-06-02 DIAGNOSIS — E7800 Pure hypercholesterolemia, unspecified: Secondary | ICD-10-CM

## 2018-06-02 DIAGNOSIS — E870 Hyperosmolality and hypernatremia: Secondary | ICD-10-CM | POA: Diagnosis present

## 2018-06-02 LAB — BLOOD GAS, ARTERIAL
Acid-base deficit: 1.6 mmol/L (ref 0.0–2.0)
BICARBONATE: 23.5 mmol/L (ref 20.0–28.0)
Drawn by: 28459
FIO2: 30
LHR: 18 {breaths}/min
MECHVT: 620 mL
O2 Saturation: 98.5 %
PEEP: 5 cmH2O
Patient temperature: 37
pCO2 arterial: 30.6 mmHg — ABNORMAL LOW (ref 32.0–48.0)
pH, Arterial: 7.464 — ABNORMAL HIGH (ref 7.350–7.450)
pO2, Arterial: 120 mmHg — ABNORMAL HIGH (ref 83.0–108.0)

## 2018-06-02 LAB — BASIC METABOLIC PANEL
Anion gap: 6 (ref 5–15)
BUN: 26 mg/dL — AB (ref 8–23)
CHLORIDE: 117 mmol/L — AB (ref 98–111)
CO2: 23 mmol/L (ref 22–32)
CREATININE: 0.87 mg/dL (ref 0.61–1.24)
Calcium: 9.4 mg/dL (ref 8.9–10.3)
GFR calc Af Amer: >60 mL/min (ref >60)
GFR calc non Af Amer: >60 mL/min (ref >60)
Glucose, Bld: 231 mg/dL — ABNORMAL HIGH (ref 70–99)
Potassium: 3.9 mmol/L (ref 3.5–5.1)
Sodium: 146 mmol/L — ABNORMAL HIGH (ref 135–145)

## 2018-06-02 LAB — GLUCOSE, CAPILLARY
GLUCOSE-CAPILLARY: 243 mg/dL — AB (ref 70–99)
GLUCOSE-CAPILLARY: 235 mg/dL — AB (ref 70–99)
Glucose-Capillary: 204 mg/dL — ABNORMAL HIGH (ref 70–99)
Glucose-Capillary: 205 mg/dL — ABNORMAL HIGH (ref 70–99)
Glucose-Capillary: 238 mg/dL — ABNORMAL HIGH (ref 70–99)
Glucose-Capillary: 263 mg/dL — ABNORMAL HIGH (ref 70–99)

## 2018-06-02 MED ORDER — INSULIN ASPART 100 UNIT/ML ~~LOC~~ SOLN
0.0000 [IU] | Freq: Three times a day (TID) | SUBCUTANEOUS | Status: DC
  Administered 2018-06-02 – 2018-06-03 (×2): 11 [IU] via SUBCUTANEOUS
  Administered 2018-06-03: 7 [IU] via SUBCUTANEOUS
  Administered 2018-06-03: 15 [IU] via SUBCUTANEOUS
  Administered 2018-06-04: 11 [IU] via SUBCUTANEOUS
  Administered 2018-06-04: 4 [IU] via SUBCUTANEOUS
  Administered 2018-06-04: 11 [IU] via SUBCUTANEOUS
  Administered 2018-06-05: 15 [IU] via SUBCUTANEOUS

## 2018-06-02 MED ORDER — TRACE MINERALS CR-CU-MN-SE-ZN 10-1000-500-60 MCG/ML IV SOLN
INTRAVENOUS | Status: AC
  Administered 2018-06-02: 18:00:00 via INTRAVENOUS
  Filled 2018-06-02: qty 1992

## 2018-06-02 MED ORDER — FAT EMULSION PLANT BASED 20 % IV EMUL
250.0000 mL | INTRAVENOUS | Status: AC
  Administered 2018-06-02: 250 mL via INTRAVENOUS
  Filled 2018-06-02: qty 250

## 2018-06-02 NOTE — Progress Notes (Signed)
PROGRESS NOTE    Keith Doyle  ZOX:[id number] DOB: November 26, 1935 DOA: 05/14/2018 PCP: Janora Norlander, DO   Brief Narrative:   82 y/o male admitted to the hospital withleft sided weakness andacute right basal ganglia CVA.Shortly after admission, patient became increasingly lethargic and began having fevers. This was followed by development of respiratory distress and he became unresponsive. He was intubated for airway protection and respiratory distress.His decompensation was secondary to aspiration pneumonia.Marland Kitchen He is being treated with IV antibiotics, but has been slow to progress. Main barrier to weaning at this point has been his secretions as well as tracheal edema. Pulmonology and neurology following. Plan is to continue ventilation for the next 2 to 3 days until family members decide whether or not he would like repeat intubation if needed versus tracheostomy.  Palliative careconsultedto help address goals of care.   Assessment & Plan:  Principal Problem: CVA (cerebral vascular accident) [facility]) Active Problems: Essential hypertension Peripheral neuropathy Hyperlipidemia Hypokalemia Diabetes mellitus with neuropathy (HCC) Paroxysmal atrial fibrillation (HCC) AKI (acute kidney injury) ([place]) Chronic diastolic congestive heart failure (HCC) Chronic anticoagulation Iron deficiency anemia due to chronic blood loss Acute respiratory failure (HCC) Aspiration pneumonia of both lower lobes due to gastric secretions (HCC) Malnutrition of moderate degree Pressure injury of skin   1. Acute CVA. Likely secondary to embolism from atrial fibrillation.  Patient had presented with acute onset of LLE weakness. MRI brain confirmedright basal ganglia infarct. MRA head showedsome atherosclerotic disease on the left side. Carotid dopplers did not show any significant stenosis bilaterally. Echo did not show any significant findings. Seen by physical therapy  who recommended SNF. He is chronically on xarelto, but this has been switched to lovenox since he did not pass his swallow evaluation. Neurology has evaluated the patient. Patient has required minimal sedation on ventilatorandhas not beenvery responsive.Repeat CT head was unrevealing. EEGwith diffuse changes and no seizure activity noted.  He is fully anticoagulated with lovenox.  He is briefly on aspirin up to 2 weeks per neurology.  2. Acute respiratory failure with hypoxia. Persists.  Related to aspiration pneumonia.Shortly after admission, patient became unresponsive, febrile and developed respiratory distress. He was intubated for airway protection and respiratory distress on 9/18. He was startedon intravenous antibiotics and has not had recurrent high fever spikes, but continues to have low grade temps. He has had 1 out of 2 positive blood cultures for coagulase-negative staph which is suspected to bea contaminant. Pulmonology following. Lactic acid wasinitially elevated, but has since trended down. He will need to remain on the ventilator for now after discussion with Dr. Luan Pulling. Multiple attempts have been made to pass OG to provide medications/feeding. This repeatedly gets coiled in his hiatal hernia. Continue TPN via PICC line. Continue current treatments.Family members have to decide whether or not he would like reintubation versus tracheostomy.  In the meantime, plan is to continue ventilator management. 3. Paroxysmal atrial fibrillation on chronic Xarelto. Currently on therapeutic dose Lovenox. Heart rate is currently stable on lopressor. Can resume xarelto once acute issues have resolved. 4. HLD. Will resume statin once able to take p.o. medications. LDL is above goal considering he has had a stroke. 5. Peripheral neuropathy. Gabapentin was placed on hold due to altered mental status. 6. Hypernatremia - continue free water and recheck in AM.   7. Hyperglycemia - likely  secondary to D5W and TNA treatments.  Follow and treat with supplemental sliding scale coverage.  8. Bilateral lower extremity edema. Mild elevation of BNP. Echo  shows normal EF. Initially had good urine output with Lasix, but further diuretics were held due to increasing creatinine.  9. AKI. Likely related to diuretics. Improved with IV fluids.  10. Diabetes. Metformin currently on hold. Continue sliding scale insulin. Blood sugars have been stable 11. Goals of care.Palliative care consultationpending. Patient has multiple medical problems that are affecting his quality of life. Mentally, he has not been responding well on the ventilator and has not been able to engage in weaning process. He also has significant swallowing issues which precipitated aspiration. It would be helpful to clearly identify goals and how patient would want to approach these difficult questions in terms of possible PEG tube. Certainly, family will have a difficult time coming to terms with this, especially since patient was so functional (independent/driving) prior to stroke.   DVT prophylaxis:Full dose Lovenox Code Status:full code Family Communication:discussed at the bedside Disposition Plan:Patient will remain in the ICU at this time for ventilator management, possible extubation in 2 days once family members decide whether or not they would like reintubation versus tracheostomy.  LTAC was investigated, however patient does not meet criteria per his insurance and would need a tracheostomy first.  DC Zosyn 9/28.    Consultants:  Neurology  Pulmonology  Procedures: Echo: - Left ventricle: The cavity size was normal. Wall thickness was increased increased in a pattern of mild to moderate LVH. Systolic function was normal. The estimated ejection fraction was in the range of 60% to 65%. Wall motion was normal; there were no regional wall motion abnormalities. - Aortic valve: Mildly calcified  annulus. Trileaflet. - Aortic root: The aortic root was mildly ectatic. - Mitral valve: There was trivial regurgitation. - Left atrium: The atrium was moderately dilated. - Right atrium: The atrium was mildly dilated. Central venous pressure (est): 8 mm Hg. - Atrial septum: No defect or patent foramen ovale was identified. - Tricuspid valve: There was trivial regurgitation.  - Pericardium, extracardiac: There was no pericardial effusion.  Intubation 9/18 >  EEG on 9/23  Antimicrobials:   Zosyn 9/18 > 9/19  Unasyn 9/19 >9/23  Zosyn 9/23 (day6/7)->  Subjective: Patient seen and evaluated today with no acute events noted overnight.  He continues to remain on the ventilator with ongoing secretions. Pt continues to have fever.    Objective: Vitals:   06/02/18 0330 06/02/18 0400 06/02/18 0430 06/02/18 0500  BP: (!) 160/90 (!) 160/91 (!) 148/76   Pulse: 81 (!) 47 85   Resp: (!) 24 (!) 26 (!) 28   Temp: (!) 100.9 F (38.3 C) (!) 101.1 F (38.4 C) (!) 101.1 F (38.4 C)   TempSrc:      SpO2: 100% 100% 100%   Weight:    81.2 kg  Height:        Intake/Output Summary (Last 24 hours) at 06/02/2018 0258 Last data filed at 06/02/2018 0500 Gross per 24 hour  Intake 4259.88 ml  Output 3100 ml  Net 1159.88 ml   Filed Weights   05/31/18 0435 06/01/18 0500 06/02/18 0500  Weight: 81.2 kg 82.6 kg 81.2 kg    Examination:  General exam: Sedated on ventilator Respiratory system: Clear to auscultation. Respiratory effort normal.  On ventilator.  Cardiovascular system: normal S1 & S2 heard. No JVD, murmurs, rubs, gallops or clicks. No pedal edema. Gastrointestinal system: Abdomen is nondistended, soft and nontender. No organomegaly or masses felt. Normal bowel sounds heard. Central nervous system: Cannot be assessed as patient is sedated Extremities: Symmetric 5 x 5  power. Skin: No rashes, lesions or ulcers or skin breakdown. Psychiatry: Cannot be assessed Foley with clear,  yellow urine output    Data Reviewed: I have personally reviewed following labs and imaging studies  CBC: Recent Labs  Lab 05/27/18 0350 05/28/18 0551 05/29/18 0401 05/30/18 0337 05/31/18 0401  WBC 8.6 5.9 8.5 6.4 7.3  NEUTROABS  --   --   --  4.4  --   HGB 10.3* 9.5* 10.4* 9.0* 9.3*  HCT 34.3* 31.6* 34.8* 29.7* 30.5*  MCV 92.2 91.9 92.8 91.7 92.1  PLT 255 252 235 238 920   Basic Metabolic Panel: Recent Labs  Lab 05/26/18 1700 05/27/18 0350 05/27/18 1615  05/29/18 0401 05/30/18 0337 05/31/18 0401 06/01/18 0411 06/02/18 0402  NA  --  146*  --    < > 149* 146* 146* 146* 146*  K  --  3.4*  --    < > 3.5 3.9 3.8 3.5 3.9  CL  --  114*  --    < > 117* 117* 115* 116* 117*  CO2  --  25  --    < > 23 21* '22 23 23  ' GLUCOSE  --  147*  --    < > 162* 150* 160* 165* 231*  BUN  --  20  --    < > '15 15 17 20 ' 26*  CREATININE  --  1.12  --    < > 0.90 0.92 0.94 0.76 0.87  CALCIUM  --  8.8*  --    < > 9.1 8.7* 9.1 9.3 9.4  MG 2.1 2.1 2.0  --   --  2.2  --  2.2  --   PHOS 2.2* 2.5 2.6  --   --  3.3  --  3.7  --    < > = values in this interval not displayed.   GFR: Estimated Creatinine Clearance: 74.3 mL/min (by C-G formula based on SCr of 0.87 mg/dL). Liver Function Tests: Recent Labs  Lab 05/27/18 0350 05/30/18 0337 06/01/18 0411  AST '18 22 19  ' ALT '13 19 23  ' ALKPHOS 45 80 122  BILITOT 0.6 0.6 0.3  PROT 6.3* 5.6* 6.0*  ALBUMIN 3.1* 2.5* 2.5*   No results for input(s): LIPASE, AMYLASE in the last 168 hours. No results for input(s): AMMONIA in the last 168 hours. Coagulation Profile: No results for input(s): INR, PROTIME in the last 168 hours. Cardiac Enzymes: No results for input(s): CKTOTAL, CKMB, CKMBINDEX, TROPONINI in the last 168 hours. BNP (last 3 results) No results for input(s): PROBNP in the last 8760 hours. HbA1C: No results for input(s): HGBA1C in the last 72 hours. CBG: Recent Labs  Lab 06/01/18 1102 06/01/18 1617 06/01/18 1925 06/02/18 0031  06/02/18 0422  GLUCAP 191* 179* 186* 205* 204*   Lipid Profile: No results for input(s): CHOL, HDL, LDLCALC, TRIG, CHOLHDL, LDLDIRECT in the last 72 hours. Thyroid Function Tests: No results for input(s): TSH, T4TOTAL, FREET4, T3FREE, THYROIDAB in the last 72 hours. Anemia Panel: No results for input(s): VITAMINB12, FOLATE, FERRITIN, TIBC, IRON, RETICCTPCT in the last 72 hours. Sepsis Labs: No results for input(s): PROCALCITON, LATICACIDVEN in the last 168 hours.  Recent Results (from the past 240 hour(s))  Culture, blood (routine x 2)     Status: Abnormal   Collection Time: 05/24/18  6:54 PM  Result Value Ref Range Status   Specimen Description   Final    BLOOD RIGHT HAND Performed at [facility], [address],  [place], [place] [address]    Special Requests   Final    BOTTLES DRAWN AEROBIC AND ANAEROBIC Blood Culture adequate volume Performed at [facility], [address] [place], [place] [address]    Culture  Setup Time   Final    GRAM POSITIVE COCCI ANAEROBIC BOTTLE GRAM STAINED RESULT CALLED TO/READ BACK FROM MISTY SMART AT 1300  BY HFLYNT 05/25/18 CRITICAL RESULT CALLED TO, READ BACK BY AND VERIFIED WITH: RN E MURPHY [phone] [id number] MLM    Culture (A)  Final    STAPHYLOCOCCUS SPECIES (COAGULASE NEGATIVE) THE SIGNIFICANCE OF ISOLATING THIS ORGANISM FROM A SINGLE SET OF BLOOD CULTURES WHEN MULTIPLE SETS ARE DRAWN IS UNCERTAIN. PLEASE NOTIFY THE MICROBIOLOGY DEPARTMENT WITHIN ONE WEEK IF SPECIATION AND SENSITIVITIES ARE REQUIRED. Performed at [facility], [address] [place], [place] [address]    Report Status 05/27/2018 FINAL  Final  Blood Culture ID Panel (Reflexed)     Status: Abnormal   Collection Time: 05/24/18  6:54 PM  Result Value Ref Range Status   Enterococcus species NOT DETECTED NOT DETECTED Final   Listeria monocytogenes NOT DETECTED NOT DETECTED Final   Staphylococcus species DETECTED (A) NOT DETECTED Final    Comment: Methicillin (oxacillin)  resistant coagulase negative staphylococcus. Possible blood culture contaminant (unless isolated from more than one blood culture draw or clinical case suggests pathogenicity). No antibiotic treatment is indicated for blood  culture contaminants. CRITICAL RESULT CALLED TO, READ BACK BY AND VERIFIED WITH: RN E MURPHY [phone] 1824 MLM    Staphylococcus aureus NOT DETECTED NOT DETECTED Final   Methicillin resistance DETECTED (A) NOT DETECTED Final    Comment: CRITICAL RESULT CALLED TO, READ BACK BY AND VERIFIED WITH: RN E MURPHY [phone] 1824 MLM    Streptococcus species NOT DETECTED NOT DETECTED Final   Streptococcus agalactiae NOT DETECTED NOT DETECTED Final   Streptococcus pneumoniae NOT DETECTED NOT DETECTED Final   Streptococcus pyogenes NOT DETECTED NOT DETECTED Final   Acinetobacter baumannii NOT DETECTED NOT DETECTED Final   Enterobacteriaceae species NOT DETECTED NOT DETECTED Final   Enterobacter cloacae complex NOT DETECTED NOT DETECTED Final   Escherichia coli NOT DETECTED NOT DETECTED Final   Klebsiella oxytoca NOT DETECTED NOT DETECTED Final   Klebsiella pneumoniae NOT DETECTED NOT DETECTED Final   Proteus species NOT DETECTED NOT DETECTED Final   Serratia marcescens NOT DETECTED NOT DETECTED Final   Haemophilus influenzae NOT DETECTED NOT DETECTED Final   Neisseria meningitidis NOT DETECTED NOT DETECTED Final   Pseudomonas aeruginosa NOT DETECTED NOT DETECTED Final   Candida albicans NOT DETECTED NOT DETECTED Final   Candida glabrata NOT DETECTED NOT DETECTED Final   Candida krusei NOT DETECTED NOT DETECTED Final   Candida parapsilosis NOT DETECTED NOT DETECTED Final   Candida tropicalis NOT DETECTED NOT DETECTED Final    Comment: Performed at [facility], [address] [place], [place] [address]  Culture, blood (routine x 2)     Status: None   Collection Time: 05/24/18  7:03 PM  Result Value Ref Range Status   Specimen Description BLOOD LEFT HAND  Final   Special  Requests   Final    BOTTLES DRAWN AEROBIC AND ANAEROBIC Blood Culture adequate volume   Culture   Final    NO GROWTH 5 DAYS Performed at [facility], [address], [place], [place] [address]    Report Status 05/29/2018 FINAL  Final  MRSA PCR Screening     Status: None   Collection  Time: 05/25/18  8:34 PM  Result Value Ref Range Status   MRSA by PCR NEGATIVE NEGATIVE Final    Comment:        The GeneXpert MRSA Assay (FDA approved for NASAL specimens only), is one component of a comprehensive MRSA colonization surveillance program. It is not intended to diagnose MRSA infection nor to guide or monitor treatment for MRSA infections. Performed at [facility], [address], [place], [place] [address]   Culture, blood (routine x 2)     Status: None (Preliminary result)   Collection Time: 05/30/18  3:10 PM  Result Value Ref Range Status   Specimen Description BLOOD LEFT ANTECUBITAL  Final   Special Requests   Final    BOTTLES DRAWN AEROBIC AND ANAEROBIC Blood Culture adequate volume   Culture   Final    NO GROWTH 2 DAYS Performed at [facility], [address] [place], [place] [address]    Report Status PENDING  Incomplete  Culture, blood (routine x 2)     Status: None (Preliminary result)   Collection Time: 05/30/18  3:47 PM  Result Value Ref Range Status   Specimen Description BLOOD LEFT FOREARM  Final   Special Requests   Final    BOTTLES DRAWN AEROBIC AND ANAEROBIC Blood Culture adequate volume   Culture   Final    NO GROWTH 2 DAYS Performed at [facility], [address], [place], [place] [address]    Report Status PENDING  Incomplete   Radiology Studies: No results found.  Scheduled Meds: . aspirin  300 mg Rectal Daily  . atorvastatin  80 mg Per Tube q1800  . chlorhexidine gluconate (MEDLINE KIT)  15 mL Mouth Rinse BID  . Chlorhexidine Gluconate Cloth  6 each Topical Daily  . Chlorhexidine Gluconate Cloth  6 each Topical Daily  . enoxaparin (LOVENOX)  injection  80 mg Subcutaneous Q12H  . insulin aspart  0-15 Units Subcutaneous Q4H  . ipratropium-albuterol  3 mL Nebulization Q6H  . mouth rinse  15 mL Mouth Rinse 10 times per day  . metoprolol tartrate  5 mg Intravenous Q6H  . sodium chloride flush  10-40 mL Intracatheter Q12H  . sodium chloride flush  3 mL Intravenous Q12H   Continuous Infusions: . Marland KitchenTPN (CLINIMIX-E) Adult 83 mL/hr at 06/01/18 1742  . sodium chloride 250 mL (05/31/18 1112)  . dextrose 5 % 1,000 mL with potassium chloride 40 mEq infusion 20 mL/hr at 06/01/18 0747  . famotidine (PEPCID) IV 20 mg (06/01/18 1332)  . piperacillin-tazobactam (ZOSYN)  IV 3.375 g (06/02/18 0422)     LOS: 10 days    Critical Care Time spent: 37 minutes  Irwin Brakeman, MD [facility] Pager [phone]  If 7PM-7AM, please contact night-coverage [email or web address] Password [facility] 06/02/2018, 6:08 AM

## 2018-06-02 NOTE — Progress Notes (Signed)
Lengthy discussion with family this morning with support from RT and Dr. Luan Pulling about the options that patient and family has. Family was very receptive and asked a lot of good questions. They want to make sure all of the family can come in this weekend and talk together. They are planning to have a final decision on Monday. Will continue to monitor.

## 2018-06-02 NOTE — Progress Notes (Signed)
[place] NOTE   Pharmacy Consult for TPN Indication: NPO x 1 week, unable to eat/unable to place NG/OG   Patient Measurements: Height: 6' (182.9 cm) Weight: 179 lb 0.2 oz (81.2 kg) IBW/kg (Calculated) : 77.6 TPN AdjBW (KG): 80.5 Body mass index is 24.28 kg/m.  Assessment:  admitted 05/31/2018 with left sided weakness and acute right basal ganglia CVA. Shortly after admission, patient became increasingly lethargic and started having fevers. The patient developed respiratory distress and became unresponsive; subsequently, he was intubated. He decompensated secondary to aspiration PNA. Multiple attempts have been made to pass OG to provide medications/feeding but has failed and repeatedly gets coiled in his hiatal hernia. No nutrition for 1 week, plan is to place a PICC line and start TPN. BG with only slight increase.   GI: NPO, intubated and unable to Place feeding tube Insulin requirements in the past 24 hours: 22 units Lytes:stable.  K: 3.9   BS increased, will add insulin to TPN Renal: Stable Pulm:  Intubated/ Aspiration PNA Neuro: S/P CVA ID: Tmax [id number]. WBC normalized, 1 of 2 BCX with CONS, likely contaminant.  Aspiration PNA TPN Access:  PICC line TPN start date: 05/30/2018 Nutritional Goals (per RD recommendation)=> Estimated Nutritional Needs:  Kcal:  2100 kcals (PSU 2003b) Protein:  121-138g Pro (1.5-1.7g/kg bw)  Current Nutrition: NPO  Plan:  Continue TPN at 83 mL/hr. 20% lipid emulsion at 20 mL/hr for 12 hours This TPN provides 48 g of lipids, 100 g of protein, 299 g of dextrose which provides 1845 kCals per day Electrolytes in TPN: standard Add MVI, trace elements, 10 units of regular insulin, and folic acid to TPN Low dose SSI and adjust as needed Continue IVMF (D5W + 20meq KCL) at 20 ml/hr Monitor TPN labs  Margot Ables, PharmD Clinical Pharmacist 06/02/2018 11:40 AM

## 2018-06-02 NOTE — Progress Notes (Signed)
Palliative: [name] is resting quietly in bed.  He is intubated and ventilated.  He had some sedation last night, but continues to be unresponsive to sternal rub this morning.  Present today at bedside his daughter Keith Doyle.  Currently the goal is for possible compassionate extubation on Monday.  Family would like to continue full scope treatment through the weekend in order for family members to arrive.  I ask Keith Doyle if there is anything that she wants to talk about, or ask about. She shakes her head no.  I reassure her that we are doing everything we can to help Keith Doyle.  I share that I will follow-up on Monday.  Encouraged to reach out to nursing staff as needed.  Detailed conversation with nursing staff related to goals of care discussion this a.m., plan of care. Conversation with hospitalist related to plan of care. 51 minutes  Quinn Axe, NP Palliative Medicine Team Team Phone # [phone]  Greater than 50% of this time was spent counseling and coordinating care related to the above assessment and plan.

## 2018-06-02 NOTE — Progress Notes (Signed)
Subjective: He remains intubated and on the ventilator.  His son and daughter-in-law and daughter are present at bedside.  Discussion regarding the patient's overall situation and what can be done was undertaken.  He had a bad night last night with agitation  Objective: Vital signs in last 24 hours: Temp:  [99.9 F (37.7 C)-101.1 F (38.4 C)] 101.1 F (38.4 C) (09/27 0430) Pulse Rate:  [44-107] 85 (09/27 0430) Resp:  [19-35] 28 (09/27 0430) BP: (121-161)/(67-111) 148/76 (09/27 0430) SpO2:  [96 %-100 %] 96 % (09/27 0852) FiO2 (%):  [30 %] 30 % (09/27 0852) Weight:  [81.2 kg] 81.2 kg (09/27 0500) Weight change: -1.4 kg Last BM Date: (Unkown pt also NPO)  Intake/Output from previous day: 09/26 0701 - 09/27 0700 In: 4259.9 [I.V.:3861.1; IV Piggyback:398.8] Out: 3100 [Urine:3100]  PHYSICAL EXAM General appearance: Intubated sedated on mechanical ventilation Resp: rhonchi bilaterally Cardio: irregularly irregular rhythm GI: soft, non-tender; bowel sounds normal; no masses,  no organomegaly Extremities: extremities normal, atraumatic, no cyanosis or edema  Lab Results:  Results for orders placed or performed during the hospital encounter of 05/26/2018 (from the past 48 hour(s))  Blood gas, arterial     Status: Abnormal   Collection Time: 05/31/18 10:20 AM  Result Value Ref Range   FIO2 40.00    Delivery systems VENTILATOR    Mode CONTINUOUS POSITIVE AIRWAY PRESSURE    Peep/cpap 5.0 cm H20   Pressure support 5 cm H20   pH, Arterial 7.449 7.350 - 7.450   pCO2 arterial 33.2 32.0 - 48.0 mmHg   pO2, Arterial 157.0 (H) 83.0 - 108.0 mmHg   Bicarbonate 24.3 20.0 - 28.0 mmol/L   Acid-base deficit 0.8 0.0 - 2.0 mmol/L   O2 Saturation 99.2 %   Collection site LEFT RADIAL    Drawn by [id number]    Sample type ARTERIAL DRAW    Allens test (pass/fail) PASS PASS    Comment: Performed at [facility], [address], [place], [place] [address]  Glucose, capillary     Status: Abnormal    Collection Time: 05/31/18 11:08 AM  Result Value Ref Range   Glucose-Capillary 138 (H) 70 - 99 mg/dL  Glucose, capillary     Status: Abnormal   Collection Time: 05/31/18  4:14 PM  Result Value Ref Range   Glucose-Capillary 153 (H) 70 - 99 mg/dL   Comment 1 Notify RN   Glucose, capillary     Status: Abnormal   Collection Time: 05/31/18  7:40 PM  Result Value Ref Range   Glucose-Capillary 140 (H) 70 - 99 mg/dL  Glucose, capillary     Status: Abnormal   Collection Time: 06/01/18 12:21 AM  Result Value Ref Range   Glucose-Capillary 150 (H) 70 - 99 mg/dL   Comment 1 QC Due   Glucose, capillary     Status: Abnormal   Collection Time: 06/01/18  4:10 AM  Result Value Ref Range   Glucose-Capillary 139 (H) 70 - 99 mg/dL  Comprehensive metabolic panel     Status: Abnormal   Collection Time: 06/01/18  4:11 AM  Result Value Ref Range   Sodium 146 (H) 135 - 145 mmol/L   Potassium 3.5 3.5 - 5.1 mmol/L   Chloride 116 (H) 98 - 111 mmol/L   CO2 23 22 - 32 mmol/L   Glucose, Bld 165 (H) 70 - 99 mg/dL   BUN 20 8 - 23 mg/dL   Creatinine, Ser 0.76 0.61 - 1.24 mg/dL   Calcium 9.3 8.9 -  10.3 mg/dL   Total Protein 6.0 (L) 6.5 - 8.1 g/dL   Albumin 2.5 (L) 3.5 - 5.0 g/dL   AST 19 15 - 41 U/L   ALT 23 0 - 44 U/L   Alkaline Phosphatase 122 38 - 126 U/L   Total Bilirubin 0.3 0.3 - 1.2 mg/dL   GFR calc non Af Amer >60 >60 mL/min   GFR calc Af Amer >60 >60 mL/min    Comment: (NOTE) The eGFR has been calculated using the CKD EPI equation. This calculation has not been validated in all clinical situations. eGFR's persistently <60 mL/min signify possible Chronic Kidney Disease.    Anion gap 7 5 - 15    Comment: Performed at [facility], [address] [place], [place] [address]  Magnesium     Status: None   Collection Time: 06/01/18  4:11 AM  Result Value Ref Range   Magnesium 2.2 1.7 - 2.4 mg/dL    Comment: Performed at [facility], [address], [place], [place] [address]  Phosphorus      Status: None   Collection Time: 06/01/18  4:11 AM  Result Value Ref Range   Phosphorus 3.7 2.5 - 4.6 mg/dL    Comment: Performed at [facility], [address], [place], [place] [address]  Blood gas, arterial     Status: Abnormal   Collection Time: 06/01/18  6:10 AM  Result Value Ref Range   FIO2 30.00    Delivery systems VENTILATOR    Mode PRESSURE REGULATED VOLUME CONTROL    VT 620 mL   LHR 18.0 resp/min   Peep/cpap 5.0 cm H20   pH, Arterial 7.432 7.350 - 7.450   pCO2 arterial 34.9 32.0 - 48.0 mmHg   pO2, Arterial 138.0 (H) 83.0 - 108.0 mmHg   Bicarbonate 24.2 20.0 - 28.0 mmol/L   Acid-base deficit 0.9 0.0 - 2.0 mmol/L   O2 Saturation 98.7 %   Patient temperature 37.6    Collection site LEFT RADIAL    Drawn by [id number]    Sample type ARTERIAL    Allens test (pass/fail) PASS PASS    Comment: Performed at [facility], [address], [place], [place] [address]  Glucose, capillary     Status: Abnormal   Collection Time: 06/01/18  7:46 AM  Result Value Ref Range   Glucose-Capillary 172 (H) 70 - 99 mg/dL  Glucose, capillary     Status: Abnormal   Collection Time: 06/01/18 11:02 AM  Result Value Ref Range   Glucose-Capillary 191 (H) 70 - 99 mg/dL  Glucose, capillary     Status: Abnormal   Collection Time: 06/01/18  4:17 PM  Result Value Ref Range   Glucose-Capillary 179 (H) 70 - 99 mg/dL  Glucose, capillary     Status: Abnormal   Collection Time: 06/01/18  7:25 PM  Result Value Ref Range   Glucose-Capillary 186 (H) 70 - 99 mg/dL  Glucose, capillary     Status: Abnormal   Collection Time: 06/02/18 12:31 AM  Result Value Ref Range   Glucose-Capillary 205 (H) 70 - 99 mg/dL  Basic metabolic panel     Status: Abnormal   Collection Time: 06/02/18  4:02 AM  Result Value Ref Range   Sodium 146 (H) 135 - 145 mmol/L   Potassium 3.9 3.5 - 5.1 mmol/L   Chloride 117 (H) 98 - 111 mmol/L   CO2 23 22 - 32 mmol/L   Glucose, Bld 231 (H) 70 - 99 mg/dL   BUN 26 (  H) 8 - 23 mg/dL    Creatinine, Ser 0.87 0.61 - 1.24 mg/dL   Calcium 9.4 8.9 - 10.3 mg/dL   GFR calc non Af Amer >60 >60 mL/min   GFR calc Af Amer >60 >60 mL/min    Comment: (NOTE) The eGFR has been calculated using the CKD EPI equation. This calculation has not been validated in all clinical situations. eGFR's persistently <60 mL/min signify possible Chronic Kidney Disease.    Anion gap 6 5 - 15    Comment: Performed at [facility], [address] [place], [place] [address]  Glucose, capillary     Status: Abnormal   Collection Time: 06/02/18  4:22 AM  Result Value Ref Range   Glucose-Capillary 204 (H) 70 - 99 mg/dL  Blood gas, arterial     Status: Abnormal   Collection Time: 06/02/18  5:43 AM  Result Value Ref Range   FIO2 30.00    Delivery systems VENTILATOR    Mode PRESSURE REGULATED VOLUME CONTROL    VT 620 mL   LHR 18 resp/min   Peep/cpap 5.0 cm H20   pH, Arterial 7.464 (H) 7.350 - 7.450   pCO2 arterial 30.6 (L) 32.0 - 48.0 mmHg   pO2, Arterial 120 (H) 83.0 - 108.0 mmHg   Bicarbonate 23.5 20.0 - 28.0 mmol/L   Acid-base deficit 1.6 0.0 - 2.0 mmol/L   O2 Saturation 98.5 %   Patient temperature 37.0    Collection site RIGHT RADIAL    Drawn by [phone]    Sample type ARTERIAL DRAW    Allens test (pass/fail) PASS PASS    Comment: Performed at [facility], [address] [place], [place] [address]  Glucose, capillary     Status: Abnormal   Collection Time: 06/02/18  7:31 AM  Result Value Ref Range   Glucose-Capillary 243 (H) 70 - 99 mg/dL    AB[facility] Recent Labs    06/02/18 0543  PHART 7.464*  PO2ART 120*  HCO3 23.5   CULTURES Recent Results (from the past 240 hour(s))  Culture, blood (routine x 2)     Status: Abnormal   Collection Time: 05/24/18  6:54 PM  Result Value Ref Range Status   Specimen Description   Final    BLOOD RIGHT HAND Performed at [facility], [address] [place], [place] [address]    Special Requests   Final    BOTTLES DRAWN AEROBIC AND ANAEROBIC Blood  Culture adequate volume Performed at [facility], [address], [place], [place] [address]    Culture  Setup Time   Final    GRAM POSITIVE COCCI ANAEROBIC BOTTLE GRAM STAINED RESULT CALLED TO/READ BACK FROM MISTY SMART AT 1300  BY HFLYNT 05/25/18 CRITICAL RESULT CALLED TO, READ BACK BY AND VERIFIED WITH: RN E MURPHY [phone] 1824 MLM    Culture (A)  Final    STAPHYLOCOCCUS SPECIES (COAGULASE NEGATIVE) THE SIGNIFICANCE OF ISOLATING THIS ORGANISM FROM A SINGLE SET OF BLOOD CULTURES WHEN MULTIPLE SETS ARE DRAWN IS UNCERTAIN. PLEASE NOTIFY THE MICROBIOLOGY DEPARTMENT WITHIN ONE WEEK IF SPECIATION AND SENSITIVITIES ARE REQUIRED. Performed at [facility], [place] [address] [place],  [address]    Report Status 05/27/2018 FINAL  Final  Blood Culture ID Panel (Reflexed)     Status: Abnormal   Collection Time: 05/24/18  6:54 PM  Result Value Ref Range Status   Enterococcus species NOT DETECTED NOT DETECTED Final   Listeria monocytogenes NOT DETECTED NOT DETECTED Final   Staphylococcus species DETECTED (  A) NOT DETECTED Final    Comment: Methicillin (oxacillin) resistant coagulase negative staphylococcus. Possible blood culture contaminant (unless isolated from more than one blood culture draw or clinical case suggests pathogenicity). No antibiotic treatment is indicated for blood  culture contaminants. CRITICAL RESULT CALLED TO, READ BACK BY AND VERIFIED WITH: RN E MURPHY [phone] 1824 MLM    Staphylococcus aureus NOT DETECTED NOT DETECTED Final   Methicillin resistance DETECTED (A) NOT DETECTED Final    Comment: CRITICAL RESULT CALLED TO, READ BACK BY AND VERIFIED WITH: RN E MURPHY [phone] 1824 MLM    Streptococcus species NOT DETECTED NOT DETECTED Final   Streptococcus agalactiae NOT DETECTED NOT DETECTED Final   Streptococcus pneumoniae NOT DETECTED NOT DETECTED Final   Streptococcus pyogenes NOT DETECTED NOT DETECTED Final   Acinetobacter baumannii NOT DETECTED NOT DETECTED Final    Enterobacteriaceae species NOT DETECTED NOT DETECTED Final   Enterobacter cloacae complex NOT DETECTED NOT DETECTED Final   Escherichia coli NOT DETECTED NOT DETECTED Final   Klebsiella oxytoca NOT DETECTED NOT DETECTED Final   Klebsiella pneumoniae NOT DETECTED NOT DETECTED Final   Proteus species NOT DETECTED NOT DETECTED Final   Serratia marcescens NOT DETECTED NOT DETECTED Final   Haemophilus influenzae NOT DETECTED NOT DETECTED Final   Neisseria meningitidis NOT DETECTED NOT DETECTED Final   Pseudomonas aeruginosa NOT DETECTED NOT DETECTED Final   Candida albicans NOT DETECTED NOT DETECTED Final   Candida glabrata NOT DETECTED NOT DETECTED Final   Candida krusei NOT DETECTED NOT DETECTED Final   Candida parapsilosis NOT DETECTED NOT DETECTED Final   Candida tropicalis NOT DETECTED NOT DETECTED Final    Comment: Performed at [facility], [address] [place], [place] [address]  Culture, blood (routine x 2)     Status: None   Collection Time: 05/24/18  7:03 PM  Result Value Ref Range Status   Specimen Description BLOOD LEFT HAND  Final   Special Requests   Final    BOTTLES DRAWN AEROBIC AND ANAEROBIC Blood Culture adequate volume   Culture   Final    NO GROWTH 5 DAYS Performed at [facility], [address], [place], [place] [address]    Report Status 05/29/2018 FINAL  Final  MRSA PCR Screening     Status: None   Collection Time: 05/25/18  8:34 PM  Result Value Ref Range Status   MRSA by PCR NEGATIVE NEGATIVE Final    Comment:        The GeneXpert MRSA Assay (FDA approved for NASAL specimens only), is one component of a comprehensive MRSA colonization surveillance program. It is not intended to diagnose MRSA infection nor to guide or monitor treatment for MRSA infections. Performed at [facility], [address] [place], [place] [address]   Culture, blood (routine x 2)     Status: None (Preliminary result)   Collection Time: 05/30/18  3:10 PM  Result  Value Ref Range Status   Specimen Description BLOOD LEFT ANTECUBITAL  Final   Special Requests   Final    BOTTLES DRAWN AEROBIC AND ANAEROBIC Blood Culture adequate volume   Culture   Final    NO GROWTH 3 DAYS Performed at [facility], [address], [place], [place] [address]    Report Status PENDING  Incomplete  Culture, blood (routine x 2)     Status: None (Preliminary result)   Collection Time: 05/30/18  3:47 PM  Result Value Ref Range Status   Specimen Description BLOOD LEFT FOREARM  Final   Special Requests   Final    BOTTLES DRAWN AEROBIC AND ANAEROBIC Blood Culture adequate volume   Culture   Final    NO GROWTH 3 DAYS Performed at [facility], [address], [place], [place] [address]    Report Status PENDING  Incomplete   Studies/Results: No results found.  Medications:  Prior to Admission:  Medications Prior to Admission  Medication Sig Dispense Refill Last Dose  . Acetaminophen 500 MG coapsule Take 500 mg by mouth as needed for pain.    unknown  . amLODipine (NORVASC) 5 MG tablet TAKE 1 TABLET BY MOUTH DAILY. (Patient taking differently: Take 5 mg by mouth daily. ) 90 tablet 1 06/05/2018 at Unknown time  . atorvastatin (LIPITOR) 40 MG tablet Take 1 tablet (40 mg total) by mouth daily. 90 tablet 1 06/03/2018 at Unknown time  . benazepril (LOTENSIN) 40 MG tablet Take 1 tablet (40 mg total) by mouth 2 (two) times daily. 180 tablet 1 05/08/2018 at Unknown time  . betamethasone dipropionate 0.05 % lotion Apply topically 2 (two) times daily. (Patient taking differently: Apply 1 application topically 2 (two) times daily as needed (for irritation). ) 120 mL 0 unknown  . colchicine 0.6 MG tablet Take 1 tablet (0.6 mg total) by mouth daily. Continue daily until you have had no pain in your foot for 2 days.  Then stop. (Patient taking differently: Take 0.6 mg by mouth daily as needed (for gout pain as directed). Continue daily until you have had no pain in your foot for 2 days.  Then  stop.) 20 tablet 0 unknown  . fenofibrate 160 MG tablet TAKE 1 TABLET EVERY DAY (Patient taking differently: Take 160 mg by mouth daily. ) 90 tablet 0 05/07/2018 at Unknown time  . furosemide (LASIX) 20 MG tablet TAKE 1 TABLET TWICE DAILY (Patient taking differently: Take 20 mg by mouth 2 (two) times daily. TAKE 1 TABLET TWICE DAILY) 180 tablet 1 05/07/2018 at Unknown time  . gabapentin (NEURONTIN) 400 MG capsule TAKE 2 CAPSULES BY MOUTH 2 (TWO) TIMES DAILY. (Patient taking differently: Take 800 mg by mouth 2 (two) times daily. ) 360 capsule 0 05/08/2018 at Unknown time  . metFORMIN (GLUCOPHAGE) 500 MG tablet Take 500 mg by mouth 2 (two) times daily with a meal.    05/29/2018 at Unknown time  . potassium chloride SA (K-DUR,KLOR-CON) 20 MEQ tablet TAKE 2 TABLETS TWICE DAILY (Patient taking differently: Take 40 mEq by mouth 2 (two) times daily. ) 360 tablet 1 05/10/2018 at Unknown time  . rivaroxaban (XARELTO) 20 MG TABS tablet Take 20 mg by mouth daily with supper.    05/24/2018 at Unknown time   Scheduled: . aspirin  300 mg Rectal Daily  . atorvastatin  80 mg Per Tube q1800  . chlorhexidine gluconate (MEDLINE KIT)  15 mL Mouth Rinse BID  . Chlorhexidine Gluconate Cloth  6 each Topical Daily  . Chlorhexidine Gluconate Cloth  6 each Topical Daily  . enoxaparin (LOVENOX) injection  80 mg Subcutaneous Q12H  . insulin aspart  0-15 Units Subcutaneous Q4H  . ipratropium-albuterol  3 mL Nebulization Q6H  . mouth rinse  15 mL Mouth Rinse 10 times per day  . metoprolol tartrate  5 mg Intravenous Q6H  . sodium chloride flush  10-40 mL Intracatheter Q12H  . sodium chloride flush  3 mL Intravenous Q12H   Continuous: . Marland KitchenTPN (CLINIMIX-E) Adult 83 mL/hr at 06/01/18 1742  . sodium chloride 250 mL (05/31/18 1112)  .  dextrose 5 % 1,000 mL with potassium chloride 40 mEq infusion 20 mL/hr at 06/01/18 0747  . famotidine (PEPCID) IV 20 mg (06/01/18 1332)  . piperacillin-tazobactam (ZOSYN)  IV 3.375 g (06/02/18 0422)    OTR:RNHAFB chloride, acetaminophen **OR** acetaminophen, albuterol, docusate, fentaNYL (SUBLIMAZE) injection, hydrALAZINE, midazolam, midazolam, ondansetron **OR** ondansetron (ZOFRAN) IV, polyvinyl alcohol, sodium chloride flush, sodium chloride flush, sodium chloride flush  Assesment: Admitted with a stroke.  He initially did fairly well then developed increasing somnolence had what appeared to be aspiration and ended up being intubated and placed on mechanical ventilation.  He has remained on the ventilator since.  He does not have known pulmonary disease and from a strictly pulmonary point of view is far as the mechanics of his respiration and his ability to breathe it looks like he could be extubated.  However his family has had trouble with deciding for sure whether they would have him reintubated or not.  He was a somewhat difficult intubation and he is had swelling of the pharynx because of multiple attempts at OG tube so I think he would be difficult to reintubate and I told him that.  As far as his stroke is concerned because of his situation it is difficult to tell if he is made much progress but he was able to blink in response to questions earlier this week although he had [place] bad night and is more sedated now.  He has aspiration pneumonia which is basically gone  He has atrial fib and is anticoagulated  He has malnutrition and we were not able to treat him with oral nutritional supplements so he is on TPN Principal Problem:   CVA (cerebral vascular accident) [facility]) Active Problems:   Essential hypertension   Peripheral neuropathy   Hyperlipidemia   Hypokalemia   Diabetes mellitus with neuropathy (HCC)   Paroxysmal atrial fibrillation (HCC)   AKI (acute kidney injury) ([place])   Chronic diastolic congestive heart failure (HCC)   Chronic anticoagulation   Iron deficiency anemia due to chronic blood loss   Acute respiratory failure (HCC)   Aspiration pneumonia of both lower lobes  due to gastric secretions (HCC)   Malnutrition of moderate degree   Pressure injury of skin   Goals of care, counseling/discussion   Palliative care by specialist   DNR (do not resuscitate) discussion   Protein-calorie malnutrition, severe   Hypernatremia   Hyperglycemia    Plan: Consensus at the time that I left after my discussion with family was that they would like for him to remain intubated over the weekend with tentative plans to extubate Monday.  I told him that I thought he might do well with extubation but there is just no way to be sure about that but that we would like to have some clarity about whether he would be reintubated or not and they are going to have a family meeting and decide.    LOS: 10 days   Artemisa Sladek L 06/02/2018, 9:55 AM

## 2018-06-02 NOTE — Patient Outreach (Signed)
[facility]) Care Management  06/02/2018  Keith Doyle 08/28/1936 [id number]   Per chart review patient remains hospitalized.    PLAN:  RNCM will continue to follow for patients discharge disposition.  Quinn Plowman RN,BSN,CCM [facility] Telephonic  [phone]

## 2018-06-03 ENCOUNTER — Inpatient Hospital Stay (HOSPITAL_COMMUNITY): Payer: Medicare HMO

## 2018-06-03 DIAGNOSIS — E870 Hyperosmolality and hypernatremia: Secondary | ICD-10-CM

## 2018-06-03 LAB — CBC WITH DIFFERENTIAL/PLATELET
Basophils Absolute: 0.1 10*3/uL (ref 0.0–0.1)
Basophils Relative: 1 %
Eosinophils Absolute: 0.2 10*3/uL (ref 0.0–0.7)
Eosinophils Relative: 2 %
HEMATOCRIT: 31.9 % — AB (ref 39.0–52.0)
HEMOGLOBIN: 9.4 g/dL — AB (ref 13.0–17.0)
LYMPHS PCT: 13 %
Lymphs Abs: 1.5 10*3/uL (ref 0.7–4.0)
MCH: 27.4 pg (ref 26.0–34.0)
MCHC: 29.5 g/dL — ABNORMAL LOW (ref 30.0–36.0)
MCV: 93 fL (ref 78.0–100.0)
Monocytes Absolute: 0.5 10*3/uL (ref 0.1–1.0)
Monocytes Relative: 5 %
NEUTROS ABS: 8.7 10*3/uL — AB (ref 1.7–7.7)
NEUTROS PCT: 79 %
Platelets: 294 10*3/uL (ref 150–400)
RBC: 3.43 MIL/uL — ABNORMAL LOW (ref 4.22–5.81)
RDW: 16.5 % — ABNORMAL HIGH (ref 11.5–15.5)
WBC: 10.9 10*3/uL — ABNORMAL HIGH (ref 4.0–10.5)

## 2018-06-03 LAB — COMPREHENSIVE METABOLIC PANEL
ALT: 20 U/L (ref 0–44)
ANION GAP: 9 (ref 5–15)
AST: 25 U/L (ref 15–41)
Albumin: 2.3 g/dL — ABNORMAL LOW (ref 3.5–5.0)
Alkaline Phosphatase: 129 U/L — ABNORMAL HIGH (ref 38–126)
BUN: 34 mg/dL — ABNORMAL HIGH (ref 8–23)
CO2: 20 mmol/L — ABNORMAL LOW (ref 22–32)
Calcium: 9.4 mg/dL (ref 8.9–10.3)
Chloride: 116 mmol/L — ABNORMAL HIGH (ref 98–111)
Creatinine, Ser: 1.13 mg/dL (ref 0.61–1.24)
GFR calc non Af Amer: 59 mL/min — ABNORMAL LOW (ref >60)
Glucose, Bld: 335 mg/dL — ABNORMAL HIGH (ref 70–99)
POTASSIUM: 4.3 mmol/L (ref 3.5–5.1)
Sodium: 145 mmol/L (ref 135–145)
Total Bilirubin: 0.4 mg/dL (ref 0.3–1.2)
Total Protein: 5.8 g/dL — ABNORMAL LOW (ref 6.5–8.1)

## 2018-06-03 LAB — GLUCOSE, CAPILLARY
GLUCOSE-CAPILLARY: 211 mg/dL — AB (ref 70–99)
Glucose-Capillary: 201 mg/dL — ABNORMAL HIGH (ref 70–99)
Glucose-Capillary: 231 mg/dL — ABNORMAL HIGH (ref 70–99)
Glucose-Capillary: 258 mg/dL — ABNORMAL HIGH (ref 70–99)
Glucose-Capillary: 293 mg/dL — ABNORMAL HIGH (ref 70–99)
Glucose-Capillary: 305 mg/dL — ABNORMAL HIGH (ref 70–99)
Glucose-Capillary: 319 mg/dL — ABNORMAL HIGH (ref 70–99)

## 2018-06-03 LAB — BLOOD GAS, ARTERIAL
ACID-BASE DEFICIT: 2 mmol/L (ref 0.0–2.0)
Bicarbonate: 23.1 mmol/L (ref 20.0–28.0)
Drawn by: 105551
FIO2: 30
MECHVT: 620 mL
Mechanical Rate: 18
O2 Saturation: 97.4 %
PCO2 ART: 30.3 mmHg — AB (ref 32.0–48.0)
PEEP/CPAP: 5 cmH2O
PH ART: 7.461 — AB (ref 7.350–7.450)
RATE: 18 resp/min
pO2, Arterial: 97.3 mmHg (ref 83.0–108.0)

## 2018-06-03 LAB — MAGNESIUM: Magnesium: 2.2 mg/dL (ref 1.7–2.4)

## 2018-06-03 MED ORDER — FAT EMULSION PLANT BASED 20 % IV EMUL
250.0000 mL | INTRAVENOUS | Status: AC
  Administered 2018-06-03: 250 mL via INTRAVENOUS
  Filled 2018-06-03: qty 250

## 2018-06-03 MED ORDER — TRACE MINERALS CR-CU-MN-SE-ZN 10-1000-500-60 MCG/ML IV SOLN
INTRAVENOUS | Status: AC
  Administered 2018-06-03: 17:00:00 via INTRAVENOUS
  Filled 2018-06-03: qty 1992

## 2018-06-03 MED ORDER — FENTANYL CITRATE (PF) 100 MCG/2ML IJ SOLN
50.0000 ug | INTRAMUSCULAR | Status: DC | PRN
  Administered 2018-06-03 – 2018-06-05 (×5): 100 ug via INTRAVENOUS
  Filled 2018-06-03 (×5): qty 2

## 2018-06-03 NOTE — Progress Notes (Signed)
PROGRESS NOTE    Keith Doyle  BMW:[id number] DOB: October 13, 1935 DOA: 05/22/2018 PCP: Janora Norlander, DO   Brief Narrative:   82 y/o male admitted to the hospital withleft sided weakness andacute right basal ganglia CVA.Shortly after admission, patient became increasingly lethargic and began having fevers. This was followed by development of respiratory distress and he became unresponsive. He was intubated for airway protection and respiratory distress.His decompensation was secondary to aspiration pneumonia.Marland Kitchen He is being treated with IV antibiotics, but has been slow to progress. Main barrier to weaning at this point has been his secretions as well as tracheal edema. Pulmonology and neurology following. Plan is to continue ventilation for the next 2 to 3 days until family members decide whether or not he would like repeat intubation if needed versus tracheostomy.  Palliative careconsultedto help address goals of care.   Assessment & Plan:  Principal Problem: CVA (cerebral vascular accident) [facility]) Active Problems: Essential hypertension Peripheral neuropathy Hyperlipidemia Hypokalemia Diabetes mellitus with neuropathy (HCC) Paroxysmal atrial fibrillation (HCC) AKI (acute kidney injury) ([place]) Chronic diastolic congestive heart failure (HCC) Chronic anticoagulation Iron deficiency anemia due to chronic blood loss Acute respiratory failure (HCC) Aspiration pneumonia of both lower lobes due to gastric secretions (HCC) Malnutrition of moderate degree Pressure injury of skin   1. Acute CVA. Likely secondary to embolism from atrial fibrillation.  Patient had presented with acute onset of LLE weakness. MRI brain confirmedright basal ganglia infarct. MRA head showedsome atherosclerotic disease on the left side. Carotid dopplers did not show any significant stenosis bilaterally. Echo did not show any significant findings. Seen by physical therapy  who recommended SNF. He is chronically on xarelto, but this has been switched to lovenox since he did not pass his swallow evaluation. Neurology has evaluated the patient. Patient has required minimal sedation on ventilatorandhas not beenvery responsive.Repeat CT head was unrevealing. EEGwith diffuse changes and no seizure activity noted.  He is fully anticoagulated with lovenox.    2. Acute respiratory failure with hypoxia. Persists.  Related to aspiration pneumonia.Shortly after admission, patient became unresponsive, febrile and developed respiratory distress. He was intubated for airway protection and respiratory distress on 9/18. He was startedon intravenous antibiotics, but unfortunately continues to have persistent fevers. He has had 1 out of 2 positive blood cultures for coagulase-negative staph which is suspected to bea contaminant. Pulmonology following. He will continue on the ventilator for now until family makes decision on goals of care. Multiple attempts have been made to pass OG to provide medications/feeding. This repeatedly gets coiled in his hiatal hernia. Continue TPN via PICC line. His fever may be related to a central process. Continue supportive therapy 3. Paroxysmal atrial fibrillation on chronic Xarelto. Currently on therapeutic dose Lovenox. Heart rate is currently stable on lopressor. Can resume xarelto once acute issues have resolved. 4. HLD. Will resume statin once able to take p.o. medications. LDL is above goal considering he has had a stroke. 5. Peripheral neuropathy. Gabapentin was placed on hold due to altered mental status. 6. Hypernatremia - resolved with hypotonic fluids.   7. Bilateral lower extremity edema. Mild elevation of BNP. Echo shows normal EF. Initially had good urine output with Lasix, but further diuretics were held due to increasing creatinine.  8. AKI. Likely related to diuretics. Resolved with IV fluids.  9. Diabetes. Metformin currently  on hold. Continue sliding scale insulin. Blood sugars have been running high 10. Goals of care.Palliative care following. Patient has multiple medical problems that are affecting his quality  of life. Mentally, he has not been responding well on the ventilator and has not been able to engage in weaning process. He also has significant swallowing issues which precipitated aspiration. Palliative care is following and family is leaning towards compassionate extubation on Monday once remaining family arrives from out of state. We discussed code status and they agreed to DNR.   DVT prophylaxis:Full dose Lovenox Code Status:DNR Family Communication:discussed with multiple children at the bedside Disposition Plan:Patient will remain in the ICU at this time for ventilator management. Family will likely elect compassionate extubation on Monday once remainder of family has arrived from out of state.    Consultants:  Neurology  Pulmonology  Palliative care  Procedures: Echo: - Left ventricle: The cavity size was normal. Wall thickness was increased increased in a pattern of mild to moderate LVH. Systolic function was normal. The estimated ejection fraction was in the range of 60% to 65%. Wall motion was normal; there were no regional wall motion abnormalities. - Aortic valve: Mildly calcified annulus. Trileaflet. - Aortic root: The aortic root was mildly ectatic. - Mitral valve: There was trivial regurgitation. - Left atrium: The atrium was moderately dilated. - Right atrium: The atrium was mildly dilated. Central venous pressure (est): 8 mm Hg. - Atrial septum: No defect or patent foramen ovale was identified. - Tricuspid valve: There was trivial regurgitation.  - Pericardium, extracardiac: There was no pericardial effusion.  Intubation 9/18 >  EEG on 9/23: This is recording shows moderate global slowing indicating moderate to global encephalopathy. 2.  There is  also infrequent burst suppression pattern typically seen with potent sedation  Antimicrobials:   Zosyn 9/18 > 9/19  Unasyn 9/19 >9/23  Zosyn 9/23 (day6/7)->  Subjective: Patient is unresponsive on ventilator. Appears to have increased work of breathing   Objective: Vitals:   06/03/18 1300 06/03/18 1330 06/03/18 1400 06/03/18 1430  BP: 136/80 134/86 (!) 153/90 (!) 158/97  Pulse: (!) 102 88 89 91  Resp: (!) 30 (!) 30 (!) 28 (!) 26  Temp: (!) 100.9 F (38.3 C) (!) 100.8 F (38.2 C) (!) 100.6 F (38.1 C) (!) 100.4 F (38 C)  TempSrc:      SpO2: 100% 100% 100% 100%  Weight:      Height:        Intake/Output Summary (Last 24 hours) at 06/03/2018 1518 Last data filed at 06/03/2018 1400 Gross per 24 hour  Intake 3272.21 ml  Output 1100 ml  Net 2172.21 ml   Filed Weights   06/01/18 0500 06/02/18 0500 06/03/18 0400  Weight: 82.6 kg 81.2 kg 80.8 kg    Examination:  General exam: Unresponsive Respiratory system: bilateral rhonchi. Increased respiratory rate and effort Cardiovascular system:tachycardic, regular. No murmurs, rubs, gallops. Gastrointestinal system: Abdomen is nondistended, soft and nontender. No organomegaly or masses felt. Normal bowel sounds heard. Central nervous system: unresponsive Extremities: No C/C/E, +pedal pulses Skin: No rashes, lesions or ulcers Psychiatry: unresponsive      Data Reviewed: I have personally reviewed following labs and imaging studies  CBC: Recent Labs  Lab 05/28/18 0551 05/29/18 0401 05/30/18 0337 05/31/18 0401 06/03/18 0359  WBC 5.9 8.5 6.4 7.3 10.9*  NEUTROABS  --   --  4.4  --  8.7*  HGB 9.5* 10.4* 9.0* 9.3* 9.4*  HCT 31.6* 34.8* 29.7* 30.5* 31.9*  MCV 91.9 92.8 91.7 92.1 93.0  PLT 252 235 238 264 546   Basic Metabolic Panel: Recent Labs  Lab 05/27/18 1615  05/30/18 0337 05/31/18 0401  06/01/18 0411 06/02/18 0402 06/03/18 0359  NA  --    < > 146* 146* 146* 146* 145  K  --    < > 3.9 3.8 3.5 3.9 4.3    CL  --    < > 117* 115* 116* 117* 116*  CO2  --    < > 21* '22 23 23 ' 20*  GLUCOSE  --    < > 150* 160* 165* 231* 335*  BUN  --    < > '15 17 20 ' 26* 34*  CREATININE  --    < > 0.92 0.94 0.76 0.87 1.13  CALCIUM  --    < > 8.7* 9.1 9.3 9.4 9.4  MG 2.0  --  2.2  --  2.2  --  2.2  PHOS 2.6  --  3.3  --  3.7  --   --    < > = values in this interval not displayed.   GFR: Estimated Creatinine Clearance: 57.2 mL/min (by C-G formula based on SCr of 1.13 mg/dL). Liver Function Tests: Recent Labs  Lab 05/30/18 0337 06/01/18 0411 06/03/18 0359  AST '22 19 25  ' ALT '19 23 20  ' ALKPHOS 80 122 129*  BILITOT 0.6 0.3 0.4  PROT 5.6* 6.0* 5.8*  ALBUMIN 2.5* 2.5* 2.3*   No results for input(s): LIPASE, AMYLASE in the last 168 hours. No results for input(s): AMMONIA in the last 168 hours. Coagulation Profile: No results for input(s): INR, PROTIME in the last 168 hours. Cardiac Enzymes: No results for input(s): CKTOTAL, CKMB, CKMBINDEX, TROPONINI in the last 168 hours. BNP (last 3 results) No results for input(s): PROBNP in the last 8760 hours. HbA1C: No results for input(s): HGBA1C in the last 72 hours. CBG: Recent Labs  Lab 06/02/18 2031 06/03/18 0017 06/03/18 0402 06/03/18 0751 06/03/18 1123  GLUCAP 238* 293* 305* 319* 258*   Lipid Profile: No results for input(s): CHOL, HDL, LDLCALC, TRIG, CHOLHDL, LDLDIRECT in the last 72 hours. Thyroid Function Tests: No results for input(s): TSH, T4TOTAL, FREET4, T3FREE, THYROIDAB in the last 72 hours. Anemia Panel: No results for input(s): VITAMINB12, FOLATE, FERRITIN, TIBC, IRON, RETICCTPCT in the last 72 hours. Sepsis Labs: No results for input(s): PROCALCITON, LATICACIDVEN in the last 168 hours.  Recent Results (from the past 240 hour(s))  Culture, blood (routine x 2)     Status: Abnormal   Collection Time: 05/24/18  6:54 PM  Result Value Ref Range Status   Specimen Description   Final    BLOOD RIGHT HAND Performed at [facility],  [address], [place], [place] [address]    Special Requests   Final    BOTTLES DRAWN AEROBIC AND ANAEROBIC Blood Culture adequate volume Performed at [facility], [address] [place], [place] [address]    Culture  Setup Time   Final    GRAM POSITIVE COCCI ANAEROBIC BOTTLE GRAM STAINED RESULT CALLED TO/READ BACK FROM MISTY SMART AT 1300  BY HFLYNT 05/25/18 CRITICAL RESULT CALLED TO, READ BACK BY AND VERIFIED WITH: RN E MURPHY [phone] 1824 MLM    Culture (A)  Final    STAPHYLOCOCCUS SPECIES (COAGULASE NEGATIVE) THE SIGNIFICANCE OF ISOLATING THIS ORGANISM FROM A SINGLE SET OF BLOOD CULTURES WHEN MULTIPLE SETS ARE DRAWN IS UNCERTAIN. PLEASE NOTIFY THE MICROBIOLOGY DEPARTMENT WITHIN ONE WEEK IF SPECIATION AND SENSITIVITIES ARE REQUIRED. Performed at [facility], [place] [address], [place], [place] [address]    Report Status 05/27/2018 FINAL  Final  Blood Culture ID  Panel (Reflexed)     Status: Abnormal   Collection Time: 05/24/18  6:54 PM  Result Value Ref Range Status   Enterococcus species NOT DETECTED NOT DETECTED Final   Listeria monocytogenes NOT DETECTED NOT DETECTED Final   Staphylococcus species DETECTED (A) NOT DETECTED Final    Comment: Methicillin (oxacillin) resistant coagulase negative staphylococcus. Possible blood culture contaminant (unless isolated from more than one blood culture draw or clinical case suggests pathogenicity). No antibiotic treatment is indicated for blood  culture contaminants. CRITICAL RESULT CALLED TO, READ BACK BY AND VERIFIED WITH: RN E MURPHY [phone] 1824 MLM    Staphylococcus aureus NOT DETECTED NOT DETECTED Final   Methicillin resistance DETECTED (A) NOT DETECTED Final    Comment: CRITICAL RESULT CALLED TO, READ BACK BY AND VERIFIED WITH: RN E MURPHY [phone] 1824 MLM    Streptococcus species NOT DETECTED NOT DETECTED Final   Streptococcus agalactiae NOT DETECTED NOT DETECTED Final   Streptococcus pneumoniae NOT DETECTED NOT DETECTED Final    Streptococcus pyogenes NOT DETECTED NOT DETECTED Final   Acinetobacter baumannii NOT DETECTED NOT DETECTED Final   Enterobacteriaceae species NOT DETECTED NOT DETECTED Final   Enterobacter cloacae complex NOT DETECTED NOT DETECTED Final   Escherichia coli NOT DETECTED NOT DETECTED Final   Klebsiella oxytoca NOT DETECTED NOT DETECTED Final   Klebsiella pneumoniae NOT DETECTED NOT DETECTED Final   Proteus species NOT DETECTED NOT DETECTED Final   Serratia marcescens NOT DETECTED NOT DETECTED Final   Haemophilus influenzae NOT DETECTED NOT DETECTED Final   Neisseria meningitidis NOT DETECTED NOT DETECTED Final   Pseudomonas aeruginosa NOT DETECTED NOT DETECTED Final   Candida albicans NOT DETECTED NOT DETECTED Final   Candida glabrata NOT DETECTED NOT DETECTED Final   Candida krusei NOT DETECTED NOT DETECTED Final   Candida parapsilosis NOT DETECTED NOT DETECTED Final   Candida tropicalis NOT DETECTED NOT DETECTED Final    Comment: Performed at [facility], [address] [place], [place] [address]  Culture, blood (routine x 2)     Status: None   Collection Time: 05/24/18  7:03 PM  Result Value Ref Range Status   Specimen Description BLOOD LEFT HAND  Final   Special Requests   Final    BOTTLES DRAWN AEROBIC AND ANAEROBIC Blood Culture adequate volume   Culture   Final    NO GROWTH 5 DAYS Performed at [facility], [address] [place], [place] [address]    Report Status 05/29/2018 FINAL  Final  MRSA PCR Screening     Status: None   Collection Time: 05/25/18  8:34 PM  Result Value Ref Range Status   MRSA by PCR NEGATIVE NEGATIVE Final    Comment:        The GeneXpert MRSA Assay (FDA approved for NASAL specimens only), is one component of a comprehensive MRSA colonization surveillance program. It is not intended to diagnose MRSA infection nor to guide or monitor treatment for MRSA infections. Performed at [facility], [address], [place], [place] [address]     Culture, blood (routine x 2)     Status: None (Preliminary result)   Collection Time: 05/30/18  3:10 PM  Result Value Ref Range Status   Specimen Description BLOOD LEFT ANTECUBITAL  Final   Special Requests   Final    BOTTLES DRAWN AEROBIC AND ANAEROBIC Blood Culture adequate volume   Culture   Final    NO GROWTH 4 DAYS Performed at [facility], [address],  [place], [place] [address]    Report Status PENDING  Incomplete  Culture, blood (routine x 2)     Status: None (Preliminary result)   Collection Time: 05/30/18  3:47 PM  Result Value Ref Range Status   Specimen Description BLOOD LEFT FOREARM  Final   Special Requests   Final    BOTTLES DRAWN AEROBIC AND ANAEROBIC Blood Culture adequate volume   Culture   Final    NO GROWTH 4 DAYS Performed at [facility], [address] [place], [place] [address]    Report Status PENDING  Incomplete   Radiology Studies: Dg Chest Port 1 View  Result Date: 06/03/2018 CLINICAL DATA:  Respiratory failure. EXAM: PORTABLE CHEST 1 VIEW COMPARISON:  Radiograph of May 31, 2018. FINDINGS: The heart size and mediastinal contours are within normal limits. Both lungs are clear. No pneumothorax or pleural effusion is noted. Endotracheal tube is unchanged in position. Stable position of right-sided PICC line with distal tip in expected position of the SVC. The visualized skeletal structures are unremarkable. IMPRESSION: Stable support apparatus. No acute cardiopulmonary abnormality seen. Electronically Signed   By: Marijo Conception, M.D.   On: 06/03/2018 14:03    Scheduled Meds: . aspirin  300 mg Rectal Daily  . atorvastatin  80 mg Per Tube q1800  . chlorhexidine gluconate (MEDLINE KIT)  15 mL Mouth Rinse BID  . Chlorhexidine Gluconate Cloth  6 each Topical Daily  . Chlorhexidine Gluconate Cloth  6 each Topical Daily  . enoxaparin (LOVENOX) injection  80 mg Subcutaneous Q12H  . insulin aspart  0-20 Units Subcutaneous TID WC  .  ipratropium-albuterol  3 mL Nebulization Q6H  . mouth rinse  15 mL Mouth Rinse 10 times per day  . metoprolol tartrate  5 mg Intravenous Q6H  . sodium chloride flush  10-40 mL Intracatheter Q12H  . sodium chloride flush  3 mL Intravenous Q12H   Continuous Infusions: . Marland KitchenTPN (CLINIMIX-E) Adult 83 mL/hr at 06/03/18 1400  . Marland KitchenTPN (CLINIMIX-E) Adult    . sodium chloride 250 mL (05/31/18 1112)  . dextrose 5 % 1,000 mL with potassium chloride 40 mEq infusion 20 mL/hr at 06/03/18 1400  . famotidine (PEPCID) IV 20 mg (06/03/18 1038)  . Fat emulsion    . piperacillin-tazobactam (ZOSYN)  IV 12.5 mL/hr at 06/03/18 1400     LOS: 11 days    Critical care time: 75mns  JKathie Dike MD [facility] Pager [phone] If 7PM-7AM, please contact night-coverage [email or web address] Password [email or web address]9/28/2019, 3:18 PM

## 2018-06-03 NOTE — Progress Notes (Signed)
Subjective: He looks worse this morning.  He has increased respiratory rate.  Not responsive.  Objective: Vital signs in last 24 hours: Temp:  [100.8 F (38.2 C)-102.2 F (39 C)] 101.5 F (38.6 C) (09/28 0815) Pulse Rate:  [29-111] 39 (09/28 0600) Resp:  [23-39] 35 (09/28 0600) BP: (104-158)/(56-131) 158/89 (09/28 0600) SpO2:  [98 %-100 %] 99 % (09/28 0815) FiO2 (%):  [30 %] 30 % (09/28 0815) Weight:  [80.8 kg] 80.8 kg (09/28 0400) Weight change: -0.4 kg Last BM Date: (Unkown pt also NPO)  Intake/Output from previous day: 09/27 0701 - 09/28 0700 In: 3234.8 [I.V.:3031.2; IV Piggyback:203.6] Out: 1100 [Urine:1100]  PHYSICAL EXAM General appearance: Intubated sedated on mechanical ventilation Resp: rhonchi bilaterally Cardio: irregularly irregular rhythm GI: soft, non-tender; bowel sounds normal; no masses,  no organomegaly Extremities: extremities normal, atraumatic, no cyanosis or edema  Lab Results:  Results for orders placed or performed during the hospital encounter of 05/28/2018 (from the past 48 hour(s))  Glucose, capillary     Status: Abnormal   Collection Time: 06/01/18 11:02 AM  Result Value Ref Range   Glucose-Capillary 191 (H) 70 - 99 mg/dL  Glucose, capillary     Status: Abnormal   Collection Time: 06/01/18  4:17 PM  Result Value Ref Range   Glucose-Capillary 179 (H) 70 - 99 mg/dL  Glucose, capillary     Status: Abnormal   Collection Time: 06/01/18  7:25 PM  Result Value Ref Range   Glucose-Capillary 186 (H) 70 - 99 mg/dL  Glucose, capillary     Status: Abnormal   Collection Time: 06/02/18 12:31 AM  Result Value Ref Range   Glucose-Capillary 205 (H) 70 - 99 mg/dL  Basic metabolic panel     Status: Abnormal   Collection Time: 06/02/18  4:02 AM  Result Value Ref Range   Sodium 146 (H) 135 - 145 mmol/L   Potassium 3.9 3.5 - 5.1 mmol/L   Chloride 117 (H) 98 - 111 mmol/L   CO2 23 22 - 32 mmol/L   Glucose, Bld 231 (H) 70 - 99 mg/dL   BUN 26 (H) 8 - 23 mg/dL    Creatinine, Ser 0.87 0.61 - 1.24 mg/dL   Calcium 9.4 8.9 - 10.3 mg/dL   GFR calc non Af Amer >60 >60 mL/min   GFR calc Af Amer >60 >60 mL/min    Comment: (NOTE) The eGFR has been calculated using the CKD EPI equation. This calculation has not been validated in all clinical situations. eGFR's persistently <60 mL/min signify possible Chronic Kidney Disease.    Anion gap 6 5 - 15    Comment: Performed at [facility], [address] [place], [place] [address]  Glucose, capillary     Status: Abnormal   Collection Time: 06/02/18  4:22 AM  Result Value Ref Range   Glucose-Capillary 204 (H) 70 - 99 mg/dL  Blood gas, arterial     Status: Abnormal   Collection Time: 06/02/18  5:43 AM  Result Value Ref Range   FIO2 30.00    Delivery systems VENTILATOR    Mode PRESSURE REGULATED VOLUME CONTROL    VT 620 mL   LHR 18 resp/min   Peep/cpap 5.0 cm H20   pH, Arterial 7.464 (H) 7.350 - 7.450   pCO2 arterial 30.6 (L) 32.0 - 48.0 mmHg   pO2, Arterial 120 (H) 83.0 - 108.0 mmHg   Bicarbonate 23.5 20.0 - 28.0 mmol/L   Acid-base deficit 1.6 0.0 - 2.0 mmol/L   O2 Saturation  98.5 %   Patient temperature 37.0    Collection site RIGHT RADIAL    Drawn by [phone]    Sample type ARTERIAL DRAW    Allens test (pass/fail) PASS PASS    Comment: Performed at [facility], [address] [place], [place] [address]  Glucose, capillary     Status: Abnormal   Collection Time: 06/02/18  7:31 AM  Result Value Ref Range   Glucose-Capillary 243 (H) 70 - 99 mg/dL  Glucose, capillary     Status: Abnormal   Collection Time: 06/02/18 11:09 AM  Result Value Ref Range   Glucose-Capillary 235 (H) 70 - 99 mg/dL  Glucose, capillary     Status: Abnormal   Collection Time: 06/02/18  5:06 PM  Result Value Ref Range   Glucose-Capillary 263 (H) 70 - 99 mg/dL  Glucose, capillary     Status: Abnormal   Collection Time: 06/02/18  8:31 PM  Result Value Ref Range   Glucose-Capillary 238 (H) 70 - 99 mg/dL   Comment 1 Notify  RN    Comment 2 Document in Chart   Glucose, capillary     Status: Abnormal   Collection Time: 06/03/18 12:17 AM  Result Value Ref Range   Glucose-Capillary 293 (H) 70 - 99 mg/dL   Comment 1 Notify RN    Comment 2 Document in Chart   Comprehensive metabolic panel     Status: Abnormal   Collection Time: 06/03/18  3:59 AM  Result Value Ref Range   Sodium 145 135 - 145 mmol/L   Potassium 4.3 3.5 - 5.1 mmol/L   Chloride 116 (H) 98 - 111 mmol/L   CO2 20 (L) 22 - 32 mmol/L   Glucose, Bld 335 (H) 70 - 99 mg/dL   BUN 34 (H) 8 - 23 mg/dL   Creatinine, Ser 1.13 0.61 - 1.24 mg/dL   Calcium 9.4 8.9 - 10.3 mg/dL   Total Protein 5.8 (L) 6.5 - 8.1 g/dL   Albumin 2.3 (L) 3.5 - 5.0 g/dL   AST 25 15 - 41 U/L   ALT 20 0 - 44 U/L   Alkaline Phosphatase 129 (H) 38 - 126 U/L   Total Bilirubin 0.4 0.3 - 1.2 mg/dL   GFR calc non Af Amer 59 (L) >60 mL/min   GFR calc Af Amer >60 >60 mL/min    Comment: (NOTE) The eGFR has been calculated using the CKD EPI equation. This calculation has not been validated in all clinical situations. eGFR's persistently <60 mL/min signify possible Chronic Kidney Disease.    Anion gap 9 5 - 15    Comment: Performed at [facility], [address], [place],  [address]  CBC with Differential/Platelet     Status: Abnormal   Collection Time: 06/03/18  3:59 AM  Result Value Ref Range   WBC 10.9 (H) 4.0 - 10.5 K/uL   RBC 3.43 (L) 4.22 - 5.81 MIL/uL   Hemoglobin 9.4 (L) 13.0 - 17.0 g/dL   HCT 31.9 (L) 39.0 - 52.0 %   MCV 93.0 78.0 - 100.0 fL   MCH 27.4 26.0 - 34.0 pg   MCHC 29.5 (L) 30.0 - 36.0 g/dL   RDW 16.5 (H) 11.5 - 15.5 %   Platelets 294 150 - 400 K/uL   Neutrophils Relative % 79 %   Neutro Abs 8.7 (H) 1.7 - 7.7 K/uL   Lymphocytes Relative 13 %   Lymphs Abs 1.5 0.7 - 4.0 K/uL   Monocytes Relative 5 %  Monocytes Absolute 0.5 0.1 - 1.0 K/uL   Eosinophils Relative 2 %   Eosinophils Absolute 0.2 0.0 - 0.7 K/uL   Basophils Relative 1 %   Basophils Absolute  0.1 0.0 - 0.1 K/uL    Comment: Performed at [facility], [address], [place], [place] [address]  Magnesium     Status: None   Collection Time: 06/03/18  3:59 AM  Result Value Ref Range   Magnesium 2.2 1.7 - 2.4 mg/dL    Comment: Performed at [facility], [address], [place], [place] [address]  Glucose, capillary     Status: Abnormal   Collection Time: 06/03/18  4:02 AM  Result Value Ref Range   Glucose-Capillary 305 (H) 70 - 99 mg/dL   Comment 1 Notify RN    Comment 2 Document in Chart   Blood gas, arterial     Status: Abnormal   Collection Time: 06/03/18  5:50 AM  Result Value Ref Range   FIO2 30.00    Delivery systems VENTILATOR    Mode PRESSURE REGULATED VOLUME CONTROL    VT 620 mL   LHR 18 resp/min   Peep/cpap 5.0 cm H20   pH, Arterial 7.461 (H) 7.350 - 7.450   pCO2 arterial 30.3 (L) 32.0 - 48.0 mmHg   pO2, Arterial 97.3 83.0 - 108.0 mmHg   Bicarbonate 23.1 20.0 - 28.0 mmol/L   Acid-base deficit 2.0 0.0 - 2.0 mmol/L   O2 Saturation 97.4 %   Collection site RADIAL    Drawn by [id number]    Sample type ARTERIAL    Allens test (pass/fail) PASS PASS   Mechanical Rate 18     Comment: Performed at [facility], [address], [place], [place] [address]  Glucose, capillary     Status: Abnormal   Collection Time: 06/03/18  7:51 AM  Result Value Ref Range   Glucose-Capillary 319 (H) 70 - 99 mg/dL    ABGS Recent Labs    06/03/18 0550  PHART 7.461*  PO2ART 97.3  HCO3 23.1   CULTURES Recent Results (from the past 240 hour(s))  Culture, blood (routine x 2)     Status: Abnormal   Collection Time: 05/24/18  6:54 PM  Result Value Ref Range Status   Specimen Description   Final    BLOOD RIGHT HAND Performed at [facility], [address], [place], [place] [address]    Special Requests   Final    BOTTLES DRAWN AEROBIC AND ANAEROBIC Blood Culture adequate volume Performed at [facility], [address] [place], [place] [address]    Culture  Setup Time   Final     GRAM POSITIVE COCCI ANAEROBIC BOTTLE GRAM STAINED RESULT CALLED TO/READ BACK FROM MISTY SMART AT 1300  BY HFLYNT 05/25/18 CRITICAL RESULT CALLED TO, READ BACK BY AND VERIFIED WITH: RN E MURPHY [phone] 1824 MLM    Culture (A)  Final    STAPHYLOCOCCUS SPECIES (COAGULASE NEGATIVE) THE SIGNIFICANCE OF ISOLATING THIS ORGANISM FROM A SINGLE SET OF BLOOD CULTURES WHEN MULTIPLE SETS ARE DRAWN IS UNCERTAIN. PLEASE NOTIFY THE MICROBIOLOGY DEPARTMENT WITHIN ONE WEEK IF SPECIATION AND SENSITIVITIES ARE REQUIRED. Performed at [facility], [place] [address], [place], [place] [address]    Report Status 05/27/2018 FINAL  Final  Blood Culture ID Panel (Reflexed)     Status: Abnormal   Collection Time: 05/24/18  6:54 PM  Result Value Ref Range Status   Enterococcus species NOT DETECTED NOT DETECTED Final   Listeria monocytogenes NOT  DETECTED NOT DETECTED Final   Staphylococcus species DETECTED (A) NOT DETECTED Final    Comment: Methicillin (oxacillin) resistant coagulase negative staphylococcus. Possible blood culture contaminant (unless isolated from more than one blood culture draw or clinical case suggests pathogenicity). No antibiotic treatment is indicated for blood  culture contaminants. CRITICAL RESULT CALLED TO, READ BACK BY AND VERIFIED WITH: RN E MURPHY [phone] 1824 MLM    Staphylococcus aureus NOT DETECTED NOT DETECTED Final   Methicillin resistance DETECTED (A) NOT DETECTED Final    Comment: CRITICAL RESULT CALLED TO, READ BACK BY AND VERIFIED WITH: RN E MURPHY [phone] 1824 MLM    Streptococcus species NOT DETECTED NOT DETECTED Final   Streptococcus agalactiae NOT DETECTED NOT DETECTED Final   Streptococcus pneumoniae NOT DETECTED NOT DETECTED Final   Streptococcus pyogenes NOT DETECTED NOT DETECTED Final   Acinetobacter baumannii NOT DETECTED NOT DETECTED Final   Enterobacteriaceae species NOT DETECTED NOT DETECTED Final   Enterobacter cloacae complex NOT DETECTED NOT DETECTED Final    Escherichia coli NOT DETECTED NOT DETECTED Final   Klebsiella oxytoca NOT DETECTED NOT DETECTED Final   Klebsiella pneumoniae NOT DETECTED NOT DETECTED Final   Proteus species NOT DETECTED NOT DETECTED Final   Serratia marcescens NOT DETECTED NOT DETECTED Final   Haemophilus influenzae NOT DETECTED NOT DETECTED Final   Neisseria meningitidis NOT DETECTED NOT DETECTED Final   Pseudomonas aeruginosa NOT DETECTED NOT DETECTED Final   Candida albicans NOT DETECTED NOT DETECTED Final   Candida glabrata NOT DETECTED NOT DETECTED Final   Candida krusei NOT DETECTED NOT DETECTED Final   Candida parapsilosis NOT DETECTED NOT DETECTED Final   Candida tropicalis NOT DETECTED NOT DETECTED Final    Comment: Performed at [facility], [address], [place], [place] [address]  Culture, blood (routine x 2)     Status: None   Collection Time: 05/24/18  7:03 PM  Result Value Ref Range Status   Specimen Description BLOOD LEFT HAND  Final   Special Requests   Final    BOTTLES DRAWN AEROBIC AND ANAEROBIC Blood Culture adequate volume   Culture   Final    NO GROWTH 5 DAYS Performed at [facility], [address], [place], [place] [address]    Report Status 05/29/2018 FINAL  Final  MRSA PCR Screening     Status: None   Collection Time: 05/25/18  8:34 PM  Result Value Ref Range Status   MRSA by PCR NEGATIVE NEGATIVE Final    Comment:        The GeneXpert MRSA Assay (FDA approved for NASAL specimens only), is one component of a comprehensive MRSA colonization surveillance program. It is not intended to diagnose MRSA infection nor to guide or monitor treatment for MRSA infections. Performed at [facility], [address], [place], [place] [address]   Culture, blood (routine x 2)     Status: None (Preliminary result)   Collection Time: 05/30/18  3:10 PM  Result Value Ref Range Status   Specimen Description BLOOD LEFT ANTECUBITAL  Final   Special Requests   Final    BOTTLES DRAWN AEROBIC  AND ANAEROBIC Blood Culture adequate volume   Culture   Final    NO GROWTH 4 DAYS Performed at [facility], [address] [place], [place] [address]    Report Status PENDING  Incomplete  Culture, blood (routine x 2)     Status: None (Preliminary result)   Collection Time: 05/30/18  3:47 PM  Result Value Ref  Range Status   Specimen Description BLOOD LEFT FOREARM  Final   Special Requests   Final    BOTTLES DRAWN AEROBIC AND ANAEROBIC Blood Culture adequate volume   Culture   Final    NO GROWTH 4 DAYS Performed at [facility], [address] [place], [place] [address]    Report Status PENDING  Incomplete   Studies/Results: No results found.  Medications:  Prior to Admission:  Medications Prior to Admission  Medication Sig Dispense Refill Last Dose  . Acetaminophen 500 MG coapsule Take 500 mg by mouth as needed for pain.    unknown  . amLODipine (NORVASC) 5 MG tablet TAKE 1 TABLET BY MOUTH DAILY. (Patient taking differently: Take 5 mg by mouth daily. ) 90 tablet 1 05/07/2018 at Unknown time  . atorvastatin (LIPITOR) 40 MG tablet Take 1 tablet (40 mg total) by mouth daily. 90 tablet 1 05/28/2018 at Unknown time  . benazepril (LOTENSIN) 40 MG tablet Take 1 tablet (40 mg total) by mouth 2 (two) times daily. 180 tablet 1 06/01/2018 at Unknown time  . betamethasone dipropionate 0.05 % lotion Apply topically 2 (two) times daily. (Patient taking differently: Apply 1 application topically 2 (two) times daily as needed (for irritation). ) 120 mL 0 unknown  . colchicine 0.6 MG tablet Take 1 tablet (0.6 mg total) by mouth daily. Continue daily until you have had no pain in your foot for 2 days.  Then stop. (Patient taking differently: Take 0.6 mg by mouth daily as needed (for gout pain as directed). Continue daily until you have had no pain in your foot for 2 days.  Then stop.) 20 tablet 0 unknown  . fenofibrate 160 MG tablet TAKE 1 TABLET EVERY DAY (Patient taking differently: Take 160 mg by mouth  daily. ) 90 tablet 0 05/10/2018 at Unknown time  . furosemide (LASIX) 20 MG tablet TAKE 1 TABLET TWICE DAILY (Patient taking differently: Take 20 mg by mouth 2 (two) times daily. TAKE 1 TABLET TWICE DAILY) 180 tablet 1 05/16/2018 at Unknown time  . gabapentin (NEURONTIN) 400 MG capsule TAKE 2 CAPSULES BY MOUTH 2 (TWO) TIMES DAILY. (Patient taking differently: Take 800 mg by mouth 2 (two) times daily. ) 360 capsule 0 05/27/2018 at Unknown time  . metFORMIN (GLUCOPHAGE) 500 MG tablet Take 500 mg by mouth 2 (two) times daily with a meal.    05/13/2018 at Unknown time  . potassium chloride SA (K-DUR,KLOR-CON) 20 MEQ tablet TAKE 2 TABLETS TWICE DAILY (Patient taking differently: Take 40 mEq by mouth 2 (two) times daily. ) 360 tablet 1 05/20/2018 at Unknown time  . rivaroxaban (XARELTO) 20 MG TABS tablet Take 20 mg by mouth daily with supper.    05/30/2018 at Unknown time   Scheduled: . aspirin  300 mg Rectal Daily  . atorvastatin  80 mg Per Tube q1800  . chlorhexidine gluconate (MEDLINE KIT)  15 mL Mouth Rinse BID  . Chlorhexidine Gluconate Cloth  6 each Topical Daily  . Chlorhexidine Gluconate Cloth  6 each Topical Daily  . enoxaparin (LOVENOX) injection  80 mg Subcutaneous Q12H  . insulin aspart  0-20 Units Subcutaneous TID WC  . ipratropium-albuterol  3 mL Nebulization Q6H  . mouth rinse  15 mL Mouth Rinse 10 times per day  . metoprolol tartrate  5 mg Intravenous Q6H  . sodium chloride flush  10-40 mL Intracatheter Q12H  . sodium chloride flush  3 mL Intravenous Q12H   Continuous: . Marland KitchenTPN (CLINIMIX-E) Adult 83 mL/hr  at 06/02/18 1810  . sodium chloride 250 mL (05/31/18 1112)  . dextrose 5 % 1,000 mL with potassium chloride 40 mEq infusion 20 mL/hr at 06/03/18 0106  . famotidine (PEPCID) IV Stopped (06/02/18 1127)  . piperacillin-tazobactam (ZOSYN)  IV 3.375 g (06/03/18 0406)   ZOX:WRUEAV chloride, acetaminophen **OR** acetaminophen, albuterol, docusate, fentaNYL (SUBLIMAZE) injection, hydrALAZINE,  midazolam, midazolam, ondansetron **OR** ondansetron (ZOFRAN) IV, polyvinyl alcohol, sodium chloride flush, sodium chloride flush, sodium chloride flush  Assesment: He was admitted to the hospital with a stroke.  Initially he seemed to be doing okay but then he developed increasing problems protecting his airway and more short of breath.  This eventually culminated in him being intubated and placed on the ventilator.  He has remained on the ventilator since.  He has aspiration pneumonia and he is worse this morning with increased respiratory rate and increased heart rate.  He has atrial fib and his heart rates up this morning.  He is anticoagulated  He has chronic diastolic heart failure which may be part of his trouble with being increasingly short of breath  He has protein calorie malnutrition and is receiving TPN Principal Problem:   CVA (cerebral vascular accident) ([place]) Active Problems:   Essential hypertension   Peripheral neuropathy   Hyperlipidemia   Hypokalemia   Diabetes mellitus with neuropathy (HCC)   Paroxysmal atrial fibrillation (HCC)   AKI (acute kidney injury) ([place])   Chronic diastolic congestive heart failure (HCC)   Chronic anticoagulation   Iron deficiency anemia due to chronic blood loss   Acute respiratory failure (HCC)   Aspiration pneumonia of both lower lobes due to gastric secretions (HCC)   Malnutrition of moderate degree   Pressure injury of skin   Goals of care, counseling/discussion   Palliative care by specialist   DNR (do not resuscitate) discussion   Protein-calorie malnutrition, severe   Hypernatremia   Hyperglycemia    Plan: Discussed again with family.  Plans are still to plan to take out endotracheal tube tomorrow.  I am going to check a chest x-ray today.    LOS: 11 days   Vinton Layson L 06/03/2018, 10:30 AM

## 2018-06-03 NOTE — Progress Notes (Signed)
[facility] NOTE   Pharmacy Consult for TPN Indication: NPO x 1 week, unable to eat/unable to place NG/OG   Patient Measurements: Height: 6' (182.9 cm) Weight: 178 lb 2.1 oz (80.8 kg) IBW/kg (Calculated) : 77.6 TPN AdjBW (KG): 80.5 Body mass index is 24.16 kg/m.  Assessment:  admitted 05/15/2018 with left sided weakness and acute right basal ganglia CVA. Shortly after admission, patient became increasingly lethargic and started having fevers. The patient developed respiratory distress and became unresponsive; subsequently, he was intubated. He decompensated secondary to aspiration PNA. Multiple attempts have been made to pass OG to provide medications/feeding but has failed and repeatedly gets coiled in his hiatal hernia. No nutrition for 1 week, plan is to place a PICC line and start TPN. BG with only slight increase.   GI: NPO, intubated and unable to Place feeding tube Insulin requirements in the past 24 hours: 26 units Lytes:stable.  K  BS increased, added 20 units insulin to TPN with increase in SS dosage. Renal: Stable Pulm:  Intubated/ Aspiration PNA Neuro: S/P CVA ID: Tmax 101.7. WBC normalized, 1 of 2 BCX with CONS, likely contaminant.  Aspiration PNA TPN Access:  PICC line TPN start date: 05/30/2018 Nutritional Goals (per RD recommendation)=> Estimated Nutritional Needs:  Kcal:  2100 kcals (PSU 2003b) Protein:  121-138g Pro (1.5-1.7g/kg bw)  Current Nutrition: NPO  Plan:  Continue TPN at 83 mL/hr. 20% lipid emulsion at 20 mL/hr for 12 hours This TPN provides 48 g of lipids, 100 g of protein, 299 g of dextrose which provides 1845 kCals per day Electrolytes in TPN: standard Add MVI, trace elements, 20 units of regular insulin, and folic acid to TPN SSI and adjust as needed Continue IVMF (D5W + 83meq KCL) at 20 ml/hr Monitor TPN labs  Margot Ables, PharmD Clinical Pharmacist 06/03/2018 11:22 AM

## 2018-06-03 NOTE — Progress Notes (Signed)
Patient was not able to communicate at this time of assessment, assessment referred to family (daughter, son, friends and son-in-law). Family expressed no immediate need at this time. However, son-law said the minister of patient had just visited prior to my arrival. I encourage family to inform patient nurse to call.

## 2018-06-04 DIAGNOSIS — D500 Iron deficiency anemia secondary to blood loss (chronic): Secondary | ICD-10-CM

## 2018-06-04 LAB — BASIC METABOLIC PANEL
Anion gap: 6 (ref 5–15)
BUN: 34 mg/dL — AB (ref 8–23)
CHLORIDE: 115 mmol/L — AB (ref 98–111)
CO2: 24 mmol/L (ref 22–32)
Calcium: 9.5 mg/dL (ref 8.9–10.3)
Creatinine, Ser: 0.86 mg/dL (ref 0.61–1.24)
GFR calc Af Amer: >60 mL/min (ref >60)
GFR calc non Af Amer: >60 mL/min (ref >60)
GLUCOSE: 316 mg/dL — AB (ref 70–99)
POTASSIUM: 3.7 mmol/L (ref 3.5–5.1)
SODIUM: 145 mmol/L (ref 135–145)

## 2018-06-04 LAB — CBC
HCT: 33.8 % — ABNORMAL LOW (ref 39.0–52.0)
Hemoglobin: 10.1 g/dL — ABNORMAL LOW (ref 13.0–17.0)
MCH: 27.8 pg (ref 26.0–34.0)
MCHC: 29.9 g/dL — ABNORMAL LOW (ref 30.0–36.0)
MCV: 93.1 fL (ref 78.0–100.0)
Platelets: 282 10*3/uL (ref 150–400)
RBC: 3.63 MIL/uL — ABNORMAL LOW (ref 4.22–5.81)
RDW: 16.2 % — AB (ref 11.5–15.5)
WBC: 13.6 10*3/uL — AB (ref 4.0–10.5)

## 2018-06-04 LAB — BLOOD GAS, ARTERIAL
ACID-BASE DEFICIT: 1.6 mmol/L (ref 0.0–2.0)
BICARBONATE: 23.4 mmol/L (ref 20.0–28.0)
Drawn by: 105551
FIO2: 40
LHR: 18 {breaths}/min
Mechanical Rate: 18
O2 Saturation: 99 %
PEEP/CPAP: 5 cmH2O
VT: 620 mL
pCO2 arterial: 32.2 mmHg (ref 32.0–48.0)
pH, Arterial: 7.448 (ref 7.350–7.450)
pO2, Arterial: 136 mmHg — ABNORMAL HIGH (ref 83.0–108.0)

## 2018-06-04 LAB — GLUCOSE, CAPILLARY
GLUCOSE-CAPILLARY: 261 mg/dL — AB (ref 70–99)
Glucose-Capillary: 273 mg/dL — ABNORMAL HIGH (ref 70–99)
Glucose-Capillary: 274 mg/dL — ABNORMAL HIGH (ref 70–99)
Glucose-Capillary: 163 mg/dL — ABNORMAL HIGH (ref 70–99)
Glucose-Capillary: 150 mg/dL — ABNORMAL HIGH (ref 70–99)
Glucose-Capillary: 185 mg/dL — ABNORMAL HIGH (ref 70–99)

## 2018-06-04 LAB — CULTURE, BLOOD (ROUTINE X 2)
CULTURE: NO GROWTH
Culture: NO GROWTH
Special Requests: ADEQUATE
Special Requests: ADEQUATE

## 2018-06-04 MED ORDER — TRACE MINERALS CR-CU-MN-SE-ZN 10-1000-500-60 MCG/ML IV SOLN
INTRAVENOUS | Status: DC
  Administered 2018-06-04: 17:00:00 via INTRAVENOUS
  Filled 2018-06-04: qty 1992

## 2018-06-04 MED ORDER — FAT EMULSION PLANT BASED 20 % IV EMUL
250.0000 mL | INTRAVENOUS | Status: DC
  Administered 2018-06-04: 250 mL via INTRAVENOUS
  Filled 2018-06-04: qty 250

## 2018-06-04 NOTE — Progress Notes (Signed)
PROGRESS NOTE    Keith Doyle  INO:[id number] DOB: [birth year] DOA: 06/01/2018 PCP: Janora Norlander, DO   Brief Narrative:   82 y/o male admitted to the hospital withleft sided weakness andacute right basal ganglia CVA.Shortly after admission, patient became increasingly lethargic and began having fevers. This was followed by development of respiratory distress and he became unresponsive. He was intubated for airway protection and respiratory distress.His decompensation was secondary to aspiration pneumonia.Marland Kitchen He is being treated with IV antibiotics, but has been slow to progress. Main barrier to weaning at this point has been his secretions as well as tracheal edema. Pulmonology and neurology following. Plan is to continue ventilation for the next 2 to 3 days until family members decide whether or not he would like repeat intubation if needed versus tracheostomy.  Palliative careconsultedto help address goals of care.   Assessment & Plan:  Principal Problem: CVA (cerebral vascular accident) [facility]) Active Problems: Essential hypertension Peripheral neuropathy Hyperlipidemia Hypokalemia Diabetes mellitus with neuropathy (HCC) Paroxysmal atrial fibrillation (HCC) AKI (acute kidney injury) ([place]) Chronic diastolic congestive heart failure (HCC) Chronic anticoagulation Iron deficiency anemia due to chronic blood loss Acute respiratory failure (HCC) Aspiration pneumonia of both lower lobes due to gastric secretions (HCC) Malnutrition of moderate degree Pressure injury of skin   1. Acute CVA. Likely secondary to embolism from atrial fibrillation.  Patient had presented with acute onset of LLE weakness. MRI brain confirmedright basal ganglia infarct. MRA head showedsome atherosclerotic disease on the left side. Carotid dopplers did not show any significant stenosis bilaterally. Echo did not show any significant findings. Seen by physical therapy  who recommended SNF. He is chronically on xarelto, but this has been switched to lovenox since he did not pass his swallow evaluation. Neurology has evaluated the patient. Patient has required minimal sedation on ventilatorandhas not beenvery responsive.Repeat CT head was unrevealing. EEGwith diffuse changes and no seizure activity noted.  He is fully anticoagulated with lovenox.    2. Acute respiratory failure with hypoxia. Persists.  Related to aspiration pneumonia.Shortly after admission, patient became unresponsive, febrile and developed respiratory distress. He was intubated for airway protection and respiratory distress on 9/18. He was startedon intravenous antibiotics, but unfortunately continues to have persistent fevers. He had 1 out of 2 positive blood cultures for coagulase-negative staph which is suspected to bea contaminant. Follow-up blood cultures did not show any growth.  Pulmonology following. He will continue on the ventilator for now until family makes decision on goals of care. Continue TPN via PICC line. His fever may be related to a central process, but appears to be improving with cooling blankets. Continue supportive therapy 3. Paroxysmal atrial fibrillation on chronic Xarelto. Currently on therapeutic dose Lovenox. Heart rate is currently stable on lopressor. Can resume xarelto once acute issues have resolved. 4. HLD. Will resume statin once able to take p.o. medications. LDL is above goal considering he has had a stroke. 5. Peripheral neuropathy. Gabapentin was placed on hold due to altered mental status. 6. Hypernatremia - resolved with hypotonic fluids.   7. Bilateral lower extremity edema. Mild elevation of BNP. Echo shows normal EF. Initially had good urine output with Lasix, but further diuretics were held due to increasing creatinine.  8. AKI. Likely related to diuretics. Resolved with IV fluids.  9. Diabetes. Metformin currently on hold. Continue sliding scale  insulin. Blood sugars have been running high 10. Goals of care.Palliative care following. Patient has multiple medical problems that are affecting his quality of life. Mentally,  he has not been responding well on the ventilator and has not been able to engage in weaning process. He also has significant swallowing issues which precipitated aspiration. Palliative care is following and family is leaning towards compassionate extubation on Monday once remaining family arrives from out of state. We discussed code status and they agreed to DNR.   DVT prophylaxis:Full dose Lovenox Code Status:DNR Family Communication:discussed with multiple children at the bedside Disposition Plan:Patient will remain in the ICU at this time for ventilator management. Family will likely elect compassionate extubation on Monday once remainder of family has arrived from out of state.    Consultants:  Neurology  Pulmonology  Palliative care  Procedures: Echo: - Left ventricle: The cavity size was normal. Wall thickness was increased increased in a pattern of mild to moderate LVH. Systolic function was normal. The estimated ejection fraction was in the range of 60% to 65%. Wall motion was normal; there were no regional wall motion abnormalities. - Aortic valve: Mildly calcified annulus. Trileaflet. - Aortic root: The aortic root was mildly ectatic. - Mitral valve: There was trivial regurgitation. - Left atrium: The atrium was moderately dilated. - Right atrium: The atrium was mildly dilated. Central venous pressure (est): 8 mm Hg. - Atrial septum: No defect or patent foramen ovale was identified. - Tricuspid valve: There was trivial regurgitation.  - Pericardium, extracardiac: There was no pericardial effusion.  Intubation 9/18 >  EEG on 9/23: This is recording shows moderate global slowing indicating moderate to global encephalopathy. 2.  There is also infrequent burst suppression  pattern typically seen with potent sedation  Antimicrobials:   Zosyn 9/18 > 9/19  Unasyn 9/19 >9/23  Zosyn 9/23 (day6/7)->9/29  Subjective: Patient is unresponsive on ventilator.  Overall fevers appear to be doing better with: Blankets.  Objective: Vitals:   06/04/18 0400 06/04/18 0433 06/04/18 0827 06/04/18 0942  BP: (!) 144/93     Pulse: 89     Resp: (!) 26     Temp: 99.5 F (37.5 C)     TempSrc:      SpO2: 100%  100%   Weight:      Height:  6' (1.829 m)  6' (1.829 m)    Intake/Output Summary (Last 24 hours) at 06/04/2018 1040 Last data filed at 06/04/2018 0532 Gross per 24 hour  Intake 3046.43 ml  Output 2400 ml  Net 646.43 ml   Filed Weights   06/02/18 0500 06/03/18 0400 06/04/18 0353  Weight: 81.2 kg 80.8 kg 87.8 kg    Examination:  General exam: unresponsive Respiratory system: Clear to auscultation. Increased respiratory effort Cardiovascular system:RRR. No murmurs, rubs, gallops. Gastrointestinal system: Abdomen is nondistended, soft and nontender. No organomegaly or masses felt. Normal bowel sounds heard. Extremities: No C/C/E, +pedal pulses Skin: No rashes, lesions or ulcers Psychiatry: unresponsive   Data Reviewed: I have personally reviewed following labs and imaging studies  CBC: Recent Labs  Lab 05/29/18 0401 05/30/18 0337 05/31/18 0401 06/03/18 0359 06/04/18 0427  WBC 8.5 6.4 7.3 10.9* 13.6*  NEUTROABS  --  4.4  --  8.7*  --   HGB 10.4* 9.0* 9.3* 9.4* 10.1*  HCT 34.8* 29.7* 30.5* 31.9* 33.8*  MCV 92.8 91.7 92.1 93.0 93.1  PLT 235 238 264 294 297   Basic Metabolic Panel: Recent Labs  Lab 05/30/18 0337 05/31/18 0401 06/01/18 0411 06/02/18 0402 06/03/18 0359 06/04/18 0427  NA 146* 146* 146* 146* 145 145  K 3.9 3.8 3.5 3.9 4.3 3.7  CL 117*  115* 116* 117* 116* 115*  CO2 21* '22 23 23 ' 20* 24  GLUCOSE 150* 160* 165* 231* 335* 316*  BUN '15 17 20 ' 26* 34* 34*  CREATININE 0.92 0.94 0.76 0.87 1.13 0.86  CALCIUM 8.7* 9.1 9.3 9.4 9.4  9.5  MG 2.2  --  2.2  --  2.2  --   PHOS 3.3  --  3.7  --   --   --    GFR: Estimated Creatinine Clearance: 75.2 mL/min (by C-G formula based on SCr of 0.86 mg/dL). Liver Function Tests: Recent Labs  Lab 05/30/18 0337 06/01/18 0411 06/03/18 0359  AST '22 19 25  ' ALT '19 23 20  ' ALKPHOS 80 122 129*  BILITOT 0.6 0.3 0.4  PROT 5.6* 6.0* 5.8*  ALBUMIN 2.5* 2.5* 2.3*   No results for input(s): LIPASE, AMYLASE in the last 168 hours. No results for input(s): AMMONIA in the last 168 hours. Coagulation Profile: No results for input(s): INR, PROTIME in the last 168 hours. Cardiac Enzymes: No results for input(s): CKTOTAL, CKMB, CKMBINDEX, TROPONINI in the last 168 hours. BNP (last 3 results) No results for input(s): PROBNP in the last 8760 hours. HbA1C: No results for input(s): HGBA1C in the last 72 hours. CBG: Recent Labs  Lab 06/03/18 1616 06/03/18 2035 06/03/18 2337 06/04/18 0345 06/04/18 0830  GLUCAP 211* 201* 231* 273* 274*   Lipid Profile: No results for input(s): CHOL, HDL, LDLCALC, TRIG, CHOLHDL, LDLDIRECT in the last 72 hours. Thyroid Function Tests: No results for input(s): TSH, T4TOTAL, FREET4, T3FREE, THYROIDAB in the last 72 hours. Anemia Panel: No results for input(s): VITAMINB12, FOLATE, FERRITIN, TIBC, IRON, RETICCTPCT in the last 72 hours. Sepsis Labs: No results for input(s): PROCALCITON, LATICACIDVEN in the last 168 hours.  Recent Results (from the past 240 hour(s))  MRSA PCR Screening     Status: None   Collection Time: 05/25/18  8:34 PM  Result Value Ref Range Status   MRSA by PCR NEGATIVE NEGATIVE Final    Comment:        The GeneXpert MRSA Assay (FDA approved for NASAL specimens only), is one component of a comprehensive MRSA colonization surveillance program. It is not intended to diagnose MRSA infection nor to guide or monitor treatment for MRSA infections. Performed at [facility], [address] [place], [place] [address]   Culture, blood  (routine x 2)     Status: None   Collection Time: 05/30/18  3:10 PM  Result Value Ref Range Status   Specimen Description BLOOD LEFT ANTECUBITAL  Final   Special Requests   Final    BOTTLES DRAWN AEROBIC AND ANAEROBIC Blood Culture adequate volume   Culture   Final    NO GROWTH 5 DAYS Performed at [facility], [address], [place], [place] [address]    Report Status 06/04/2018 FINAL  Final  Culture, blood (routine x 2)     Status: None   Collection Time: 05/30/18  3:47 PM  Result Value Ref Range Status   Specimen Description BLOOD LEFT FOREARM  Final   Special Requests   Final    BOTTLES DRAWN AEROBIC AND ANAEROBIC Blood Culture adequate volume   Culture   Final    NO GROWTH 5 DAYS Performed at [facility], [address], [place],  [address]    Report Status 06/04/2018 FINAL  Final   Radiology Studies: Dg Chest Port 1 View  Result Date: 06/03/2018 CLINICAL DATA:  Respiratory failure. EXAM: PORTABLE CHEST 1 VIEW COMPARISON:  Radiograph of May 31, 2018. FINDINGS: The heart size and mediastinal contours are within normal limits. Both lungs are clear. No pneumothorax or pleural effusion is noted. Endotracheal tube is unchanged in position. Stable position of right-sided PICC line with distal tip in expected position of the SVC. The visualized skeletal structures are unremarkable. IMPRESSION: Stable support apparatus. No acute cardiopulmonary abnormality seen. Electronically Signed   By: Marijo Conception, M.D.   On: 06/03/2018 14:03    Scheduled Meds: . aspirin  300 mg Rectal Daily  . atorvastatin  80 mg Per Tube q1800  . chlorhexidine gluconate (MEDLINE KIT)  15 mL Mouth Rinse BID  . Chlorhexidine Gluconate Cloth  6 each Topical Daily  . Chlorhexidine Gluconate Cloth  6 each Topical Daily  . enoxaparin (LOVENOX) injection  80 mg Subcutaneous Q12H  . insulin aspart  0-20 Units Subcutaneous TID WC  . ipratropium-albuterol  3 mL Nebulization Q6H  . mouth rinse  15 mL  Mouth Rinse 10 times per day  . metoprolol tartrate  5 mg Intravenous Q6H  . sodium chloride flush  10-40 mL Intracatheter Q12H  . sodium chloride flush  3 mL Intravenous Q12H   Continuous Infusions: . Marland KitchenTPN (CLINIMIX-E) Adult 83 mL/hr at 06/04/18 0532  . Marland KitchenTPN (CLINIMIX-E) Adult    . sodium chloride 250 mL (05/31/18 1112)  . dextrose 5 % 1,000 mL with potassium chloride 40 mEq infusion 20 mL/hr at 06/04/18 0532  . famotidine (PEPCID) IV 20 mg (06/04/18 1018)  . Fat emulsion       LOS: 12 days    Critical care time: 71mns  JKathie Dike MD [facility] Pager [phone] If 7PM-7AM, please contact night-coverage [email or web address] Password [email or web address] 06/04/2018, 10:40 AM

## 2018-06-04 NOTE — Progress Notes (Signed)
[facility] NOTE   Pharmacy Consult for TPN Indication: NPO x 1 week, unable to eat/unable to place NG/OG   Patient Measurements: Height: 6' (182.9 cm) Weight: 193 lb 9 oz (87.8 kg) IBW/kg (Calculated) : 77.6 TPN AdjBW (KG): 80.5 Body mass index is 26.25 kg/m.  Assessment:  admitted 05/27/2018 with left sided weakness and acute right basal ganglia CVA. Shortly after admission, patient became increasingly lethargic and started having fevers. The patient developed respiratory distress and became unresponsive; subsequently, he was intubated. He decompensated secondary to aspiration PNA. Multiple attempts have been made to pass OG to provide medications/feeding but has failed and repeatedly gets coiled in his hiatal hernia. No nutrition for 1 week, plan is to place a PICC line and start TPN. Plan seems to be for compassionate extubation on Monday.  GI: NPO, intubated and unable to Place feeding tube Insulin requirements in the past 24 hours: 18 units Lytes:stable.  K  BS somewhat improved, added 20 units insulin to TPN with increase in SS dosage. Renal: Stable Pulm:  Intubated/ Aspiration PNA Neuro: S/P CVA ID: Tmax 101.7. WBC13.6, 1 of 2 BCX with CONS, likely contaminant.  Aspiration PNA TPN Access:  PICC line TPN start date: 05/30/2018 Nutritional Goals (per RD recommendation)=> Estimated Nutritional Needs:  Kcal:  2100 kcals (PSU 2003b) Protein:  121-138g Pro (1.5-1.7g/kg bw)  Current Nutrition: NPO  Plan:  Continue TPN at 83 mL/hr. 20% lipid emulsion at 20 mL/hr for 12 hours This TPN provides 48 g of lipids, 100 g of protein, 299 g of dextrose which provides 1845 kCals per day Electrolytes in TPN: standard Add MVI, trace elements, 30 units of regular insulin, and folic acid to TPN SSI and adjust as needed Continue IVMF (D5W + 40meq KCL) at 20 ml/hr Monitor TPN labs  Margot Ables, PharmD Clinical Pharmacist 06/04/2018 9:44 AM

## 2018-06-04 NOTE — Progress Notes (Signed)
Pharmacy Antibiotic Note  Keith Doyle is a 82 y.o. male admitted on 05/11/2018 with possible aspiration pneumonia.  Pharmacy has been consulted for Zosyn dosing.  Plan: Start Zosyn 3.375g IV q8h (4-hr inf) Pharmacy will monitor renal function, cultures and patient progress.  Height: 6' (182.9 cm) Weight: 193 lb 9 oz (87.8 kg) IBW/kg (Calculated) : 77.6  Temp (24hrs), Avg:100.3 F (37.9 C), Min:98.1 F (36.7 C), Max:101.8 F (38.8 C)  Recent Labs  Lab 05/29/18 0401 05/30/18 0337 05/31/18 0401 06/01/18 0411 06/02/18 0402 06/03/18 0359 06/04/18 0427  WBC 8.5 6.4 7.3  --   --  10.9* 13.6*  CREATININE 0.90 0.92 0.94 0.76 0.87 1.13 0.86    Estimated Creatinine Clearance: 75.2 mL/min (by C-G formula based on SCr of 0.86 mg/dL).    No Known Allergies  Antimicrobials this admission: Zosyn 9/23 >>      Microbiology results: 9/18  BCx2:CoNS (MRSA detected) 9/19  MRSA PCR: neg  Thank you for allowing pharmacy to be a part of this patient's care.  Ramond Craver 06/04/2018 9:49 AM

## 2018-06-04 NOTE — Progress Notes (Signed)
Patient opened eyes blinked, had little gag reflex when suctioned. Now eyes are back closed

## 2018-06-05 DIAGNOSIS — R509 Fever, unspecified: Secondary | ICD-10-CM

## 2018-06-05 LAB — BLOOD GAS, ARTERIAL
Acid-base deficit: 1.6 mmol/L (ref 0.0–2.0)
BICARBONATE: 23.3 mmol/L (ref 20.0–28.0)
DRAWN BY: 105551
FIO2: 40
LHR: 18 {breaths}/min
MECHANICAL RATE: 18
MECHVT: 620 mL
O2 Saturation: 94.7 %
PCO2 ART: 31.5 mmHg — AB (ref 32.0–48.0)
PEEP/CPAP: 5 cmH2O
PO2 ART: 77.1 mmHg — AB (ref 83.0–108.0)
pH, Arterial: 7.455 — ABNORMAL HIGH (ref 7.350–7.450)

## 2018-06-05 LAB — CBC
HCT: 29.4 % — ABNORMAL LOW (ref 39.0–52.0)
HEMOGLOBIN: 9 g/dL — AB (ref 13.0–17.0)
MCH: 28.2 pg (ref 26.0–34.0)
MCHC: 30.6 g/dL (ref 30.0–36.0)
MCV: 92.2 fL (ref 78.0–100.0)
Platelets: 255 10*3/uL (ref 150–400)
RBC: 3.19 MIL/uL — AB (ref 4.22–5.81)
RDW: 16 % — ABNORMAL HIGH (ref 11.5–15.5)
WBC: 12.5 10*3/uL — AB (ref 4.0–10.5)

## 2018-06-05 LAB — DIFFERENTIAL
BASOS ABS: 0 10*3/uL (ref 0.0–0.1)
Basophils Relative: 0 %
EOS ABS: 0.2 10*3/uL (ref 0.0–0.7)
Eosinophils Relative: 2 %
LYMPHS ABS: 1.3 10*3/uL (ref 0.7–4.0)
LYMPHS PCT: 10 %
Monocytes Absolute: 0.6 10*3/uL (ref 0.1–1.0)
Monocytes Relative: 5 %
NEUTROS ABS: 10.5 10*3/uL — AB (ref 1.7–7.7)
NEUTROS PCT: 83 %

## 2018-06-05 LAB — COMPREHENSIVE METABOLIC PANEL
ALT: 22 U/L (ref 0–44)
AST: 19 U/L (ref 15–41)
Albumin: 2 g/dL — ABNORMAL LOW (ref 3.5–5.0)
Alkaline Phosphatase: 118 U/L (ref 38–126)
Anion gap: 5 (ref 5–15)
BUN: 33 mg/dL — ABNORMAL HIGH (ref 8–23)
CHLORIDE: 117 mmol/L — AB (ref 98–111)
CO2: 23 mmol/L (ref 22–32)
Calcium: 9.4 mg/dL (ref 8.9–10.3)
Creatinine, Ser: 0.88 mg/dL (ref 0.61–1.24)
GFR calc non Af Amer: >60 mL/min (ref >60)
Glucose, Bld: 302 mg/dL — ABNORMAL HIGH (ref 70–99)
POTASSIUM: 3.9 mmol/L (ref 3.5–5.1)
SODIUM: 145 mmol/L (ref 135–145)
Total Bilirubin: 0.4 mg/dL (ref 0.3–1.2)
Total Protein: 5.7 g/dL — ABNORMAL LOW (ref 6.5–8.1)

## 2018-06-05 LAB — PREALBUMIN: PREALBUMIN: 9.1 mg/dL — AB (ref 18–38)

## 2018-06-05 LAB — TRIGLYCERIDES: Triglycerides: 182 mg/dL — ABNORMAL HIGH (ref <150)

## 2018-06-05 LAB — MAGNESIUM: Magnesium: 2.2 mg/dL (ref 1.7–2.4)

## 2018-06-05 LAB — PHOSPHORUS: PHOSPHORUS: 3.3 mg/dL (ref 2.5–4.6)

## 2018-06-05 LAB — GLUCOSE, CAPILLARY
GLUCOSE-CAPILLARY: 273 mg/dL — AB (ref 70–99)
GLUCOSE-CAPILLARY: 314 mg/dL — AB (ref 70–99)

## 2018-06-05 MED ORDER — INSULIN ASPART 100 UNIT/ML ~~LOC~~ SOLN: 0.0000 [IU] | Freq: Three times a day (TID) | SUBCUTANEOUS | Status: DC

## 2018-06-05 MED ORDER — ATROPINE SULFATE 1 % OP SOLN
2.0000 [drp] | Freq: Three times a day (TID) | OPHTHALMIC | Status: DC
  Administered 2018-06-05 – 2018-06-06 (×2): 2 [drp] via SUBLINGUAL
  Filled 2018-06-05: qty 2

## 2018-06-05 MED ORDER — FUROSEMIDE 10 MG/ML IJ SOLN
40.0000 mg | Freq: Once | INTRAMUSCULAR | Status: AC
  Administered 2018-06-05: 40 mg via INTRAVENOUS
  Filled 2018-06-05: qty 4

## 2018-06-05 MED ORDER — INSULIN ASPART 100 UNIT/ML ~~LOC~~ SOLN: 0.0000 [IU] | Freq: Every day | SUBCUTANEOUS | Status: DC

## 2018-06-05 MED ORDER — MORPHINE 100MG IN NS 100ML (1MG/ML) PREMIX INFUSION
2.0000 mg/h | INTRAVENOUS | Status: DC
  Administered 2018-06-05: 2 mg/h via INTRAVENOUS
  Administered 2018-06-06: 8 mg/h via INTRAVENOUS
  Filled 2018-06-05 (×3): qty 100

## 2018-06-05 MED ORDER — MORPHINE SULFATE (PF) 2 MG/ML IV SOLN
1.0000 mg | INTRAVENOUS | Status: DC | PRN
  Administered 2018-06-05 (×3): 2 mg via INTRAVENOUS
  Filled 2018-06-05 (×3): qty 1

## 2018-06-05 NOTE — Progress Notes (Signed)
Inpatient Diabetes Program Recommendations  AACE/ADA: New Consensus Statement on Inpatient Glycemic Control (2019)  Target Ranges:  Prepandial:   less than 140 mg/dL      Peak postprandial:   less than 180 mg/dL (1-2 hours)      Critically ill patients:  140 - 180 mg/dL   Results for SALAR, MOLDEN (MRN [id number]) as of 06/05/2018 14:21  Ref. Range 06/04/2018 08:30 06/04/2018 11:31 06/04/2018 16:33 06/04/2018 20:25 06/04/2018 23:09 06/05/2018 04:19 06/05/2018 07:54  Glucose-Capillary Latest Ref Range: 70 - 99 mg/dL 274 (H) 261 (H) 163 (H) 150 (H) 185 (H) 273 (H) 314 (H)   Review of Glycemic Control  Diabetes history: DM2 Outpatient Diabetes medications: Metformin 500 mg BID Current orders for Inpatient glycemic control: Novolog 0-20 units TID with meals, Novolog 0-5 units QHS; TPN currently with 20 units added (new order to start at 6pm with 30 units of insulin)  Inpatient Diabetes Program Recommendations: Correction (SSI): Please consider changing CBGs and Novolog correction to Q4H since patient is on TPN.  Thanks, Barnie Alderman, RN, MSN, CDE Diabetes Coordinator Inpatient Diabetes Program [phone] (Team Pager from 8am to 5pm)

## 2018-06-05 NOTE — Care Management Important Message (Signed)
Important Message  Patient Details  Name: JAVARI BUFKIN MRN: [id number] Date of Birth: [birth year]   Medicare Important Message Given:  Yes    [name] 06/05/2018, 11:51 AM

## 2018-06-05 NOTE — Progress Notes (Signed)
Patient appears to be getting some fluid in et tube . He is ahead on fluids by 7652 net .

## 2018-06-05 NOTE — Procedures (Signed)
**Note De-Identified Rozann Holts Obfuscation** Extubation Procedure Note  Patient Details:   Name: Keith Doyle DOB: [birth year] MRN: [id number]   Airway Documentation:    Vent end date: 06/05/18 Vent end time: 1149   Evaluation  O2 sats: stable throughout Complications: No apparent complications Patient did tolerate procedure well. Bilateral Breath Sounds: Diminished   No +leak, VS WNL Annisa Mazzarella, Penni Bombard 06/05/2018, 11:53 AM

## 2018-06-05 NOTE — Progress Notes (Signed)
Subjective: He is overall about the same.  Remains intubated and on the ventilator.  Discussed with his son at bedside.  He looks better than when I saw him last on 928.  Objective: Vital signs in last 24 hours: Temp:  [99.7 F (37.6 C)-100.8 F (38.2 C)] 100.6 F (38.1 C) (09/29 1800) Pulse Rate:  [54-102] 92 (09/29 1800) Resp:  [22-31] 26 (09/29 1800) BP: (132-162)/(37-102) 162/91 (09/29 1800) SpO2:  [86 %-100 %] 100 % (09/30 0819) FiO2 (%):  [40 %] 40 % (09/30 0820) Weight:  [83.6 kg] 83.6 kg (09/30 0400) Weight change: -4.2 kg Last BM Date: (Unkown pt also NPO)  Intake/Output from previous day: 09/29 0701 - 09/30 0700 In: 3517.4 [I.V.:1536.3; IV Piggyback:1981] Out: 450 [Urine:450]  PHYSICAL EXAM General appearance: Intubated sedated on mechanical ventilation Resp: rhonchi bilaterally Cardio: irregularly irregular rhythm GI: soft, non-tender; bowel sounds normal; no masses,  no organomegaly Extremities: extremities normal, atraumatic, no cyanosis or edema  Lab Results:  Results for orders placed or performed during the hospital encounter of 05/10/2018 (from the past 48 hour(s))  Glucose, capillary     Status: Abnormal   Collection Time: 06/03/18 11:23 AM  Result Value Ref Range   Glucose-Capillary 258 (H) 70 - 99 mg/dL  Glucose, capillary     Status: Abnormal   Collection Time: 06/03/18  4:16 PM  Result Value Ref Range   Glucose-Capillary 211 (H) 70 - 99 mg/dL   Comment 1 Notify RN    Comment 2 Document in Chart   Glucose, capillary     Status: Abnormal   Collection Time: 06/03/18  8:35 PM  Result Value Ref Range   Glucose-Capillary 201 (H) 70 - 99 mg/dL   Comment 1 Notify RN    Comment 2 Document in Chart   Glucose, capillary     Status: Abnormal   Collection Time: 06/03/18 11:37 PM  Result Value Ref Range   Glucose-Capillary 231 (H) 70 - 99 mg/dL   Comment 1 Notify RN    Comment 2 Document in Chart   Glucose, capillary     Status: Abnormal   Collection  Time: 06/04/18  3:45 AM  Result Value Ref Range   Glucose-Capillary 273 (H) 70 - 99 mg/dL  Basic metabolic panel     Status: Abnormal   Collection Time: 06/04/18  4:27 AM  Result Value Ref Range   Sodium 145 135 - 145 mmol/L   Potassium 3.7 3.5 - 5.1 mmol/L   Chloride 115 (H) 98 - 111 mmol/L   CO2 24 22 - 32 mmol/L   Glucose, Bld 316 (H) 70 - 99 mg/dL   BUN 34 (H) 8 - 23 mg/dL   Creatinine, Ser 0.86 0.61 - 1.24 mg/dL   Calcium 9.5 8.9 - 10.3 mg/dL   GFR calc non Af Amer >60 >60 mL/min   GFR calc Af Amer >60 >60 mL/min    Comment: (NOTE) The eGFR has been calculated using the CKD EPI equation. This calculation has not been validated in all clinical situations. eGFR's persistently <60 mL/min signify possible Chronic Kidney Disease.    Anion gap 6 5 - 15    Comment: Performed at [facility], [address], [place], [place] [address]  CBC     Status: Abnormal   Collection Time: 06/04/18  4:27 AM  Result Value Ref Range   WBC 13.6 (H) 4.0 - 10.5 K/uL   RBC 3.63 (L) 4.22 - 5.81 MIL/uL   Hemoglobin 10.1 (L) 13.0 -  17.0 g/dL   HCT 33.8 (L) 39.0 - 52.0 %   MCV 93.1 78.0 - 100.0 fL   MCH 27.8 26.0 - 34.0 pg   MCHC 29.9 (L) 30.0 - 36.0 g/dL   RDW 16.2 (H) 11.5 - 15.5 %   Platelets 282 150 - 400 K/uL    Comment: Performed at [facility], [address], [place], [place] [address]  Blood gas, arterial     Status: Abnormal   Collection Time: 06/04/18  6:15 AM  Result Value Ref Range   FIO2 40.00    Delivery systems VENTILATOR    Mode PRESSURE REGULATED VOLUME CONTROL    VT 620 mL   LHR 18 resp/min   Peep/cpap 5.0 cm H20   pH, Arterial 7.448 7.350 - 7.450   pCO2 arterial 32.2 32.0 - 48.0 mmHg   pO2, Arterial 136.0 (H) 83.0 - 108.0 mmHg   Bicarbonate 23.4 20.0 - 28.0 mmol/L   Acid-base deficit 1.6 0.0 - 2.0 mmol/L   O2 Saturation 99.0 %   Collection site RADIAL    Drawn by [id number]    Sample type ARTERIAL    Allens test (pass/fail) PASS PASS   Mechanical Rate 18     Comment:  Performed at [facility], [address], [place], [place] [address]  Glucose, capillary     Status: Abnormal   Collection Time: 06/04/18  8:30 AM  Result Value Ref Range   Glucose-Capillary 274 (H) 70 - 99 mg/dL  Glucose, capillary     Status: Abnormal   Collection Time: 06/04/18 11:31 AM  Result Value Ref Range   Glucose-Capillary 261 (H) 70 - 99 mg/dL  Glucose, capillary     Status: Abnormal   Collection Time: 06/04/18  4:33 PM  Result Value Ref Range   Glucose-Capillary 163 (H) 70 - 99 mg/dL  Glucose, capillary     Status: Abnormal   Collection Time: 06/04/18  8:25 PM  Result Value Ref Range   Glucose-Capillary 150 (H) 70 - 99 mg/dL  Glucose, capillary     Status: Abnormal   Collection Time: 06/04/18 11:09 PM  Result Value Ref Range   Glucose-Capillary 185 (H) 70 - 99 mg/dL  Blood gas, arterial     Status: Abnormal   Collection Time: 06/05/18  3:20 AM  Result Value Ref Range   FIO2 40.00    Delivery systems VENTILATOR    Mode PRESSURE REGULATED VOLUME CONTROL    VT 620 mL   LHR 18 resp/min   Peep/cpap 5.0 cm H20   pH, Arterial 7.455 (H) 7.350 - 7.450   pCO2 arterial 31.5 (L) 32.0 - 48.0 mmHg   pO2, Arterial 77.1 (L) 83.0 - 108.0 mmHg   Bicarbonate 23.3 20.0 - 28.0 mmol/L   Acid-base deficit 1.6 0.0 - 2.0 mmol/L   O2 Saturation 94.7 %   Collection site RADIAL    Drawn by [id number]    Sample type ARTERIAL    Allens test (pass/fail) PASS PASS   Mechanical Rate 18     Comment: Performed at [facility], [address] [place], [place] [address]  Comprehensive metabolic panel     Status: Abnormal   Collection Time: 06/05/18  3:54 AM  Result Value Ref Range   Sodium 145 135 - 145 mmol/L   Potassium 3.9 3.5 - 5.1 mmol/L   Chloride 117 (H) 98 - 111 mmol/L   CO2 23 22 - 32 mmol/L   Glucose, Bld 302 (H) 70 - 99 mg/dL  BUN 33 (H) 8 - 23 mg/dL   Creatinine, Ser 0.88 0.61 - 1.24 mg/dL   Calcium 9.4 8.9 - 10.3 mg/dL   Total Protein 5.7 (L) 6.5 - 8.1 g/dL   Albumin 2.0 (L)  3.5 - 5.0 g/dL   AST 19 15 - 41 U/L   ALT 22 0 - 44 U/L   Alkaline Phosphatase 118 38 - 126 U/L   Total Bilirubin 0.4 0.3 - 1.2 mg/dL   GFR calc non Af Amer >60 >60 mL/min   GFR calc Af Amer >60 >60 mL/min    Comment: (NOTE) The eGFR has been calculated using the CKD EPI equation. This calculation has not been validated in all clinical situations. eGFR's persistently <60 mL/min signify possible Chronic Kidney Disease.    Anion gap 5 5 - 15    Comment: Performed at [facility], [address] [place], [place] [address]  Magnesium     Status: None   Collection Time: 06/05/18  3:54 AM  Result Value Ref Range   Magnesium 2.2 1.7 - 2.4 mg/dL    Comment: Performed at [facility], [address], [place], [place] [address]  Phosphorus     Status: None   Collection Time: 06/05/18  3:54 AM  Result Value Ref Range   Phosphorus 3.3 2.5 - 4.6 mg/dL    Comment: Performed at [facility], [address], [place], [place] [address]  CBC     Status: Abnormal   Collection Time: 06/05/18  3:54 AM  Result Value Ref Range   WBC 12.5 (H) 4.0 - 10.5 K/uL   RBC 3.19 (L) 4.22 - 5.81 MIL/uL   Hemoglobin 9.0 (L) 13.0 - 17.0 g/dL   HCT 29.4 (L) 39.0 - 52.0 %   MCV 92.2 78.0 - 100.0 fL   MCH 28.2 26.0 - 34.0 pg   MCHC 30.6 30.0 - 36.0 g/dL   RDW 16.0 (H) 11.5 - 15.5 %   Platelets 255 150 - 400 K/uL    Comment: Performed at [facility], [address] [place], [place] [address]  Differential     Status: Abnormal   Collection Time: 06/05/18  3:54 AM  Result Value Ref Range   Neutrophils Relative % 83 %   Neutro Abs 10.5 (H) 1.7 - 7.7 K/uL   Lymphocytes Relative 10 %   Lymphs Abs 1.3 0.7 - 4.0 K/uL   Monocytes Relative 5 %   Monocytes Absolute 0.6 0.1 - 1.0 K/uL   Eosinophils Relative 2 %   Eosinophils Absolute 0.2 0.0 - 0.7 K/uL   Basophils Relative 0 %   Basophils Absolute 0.0 0.0 - 0.1 K/uL    Comment: Performed at [facility], [address], [place], [place] [address]  Triglycerides      Status: Abnormal   Collection Time: 06/05/18  3:55 AM  Result Value Ref Range   Triglycerides 182 (H) <150 mg/dL    Comment: Performed at [facility], [address] [place], [place] [address]  Glucose, capillary     Status: Abnormal   Collection Time: 06/05/18  4:19 AM  Result Value Ref Range   Glucose-Capillary 273 (H) 70 - 99 mg/dL  Glucose, capillary     Status: Abnormal   Collection Time: 06/05/18  7:54 AM  Result Value Ref Range   Glucose-Capillary 314 (H) 70 - 99 mg/dL    AB[facility] Recent Labs    06/05/18 0320  PHART 7.455*  PO2ART 77.1*  HCO3 23.3   CULTURES Recent Results (  from the past 240 hour(s))  Culture, blood (routine x 2)     Status: None   Collection Time: 05/30/18  3:10 PM  Result Value Ref Range Status   Specimen Description BLOOD LEFT ANTECUBITAL  Final   Special Requests   Final    BOTTLES DRAWN AEROBIC AND ANAEROBIC Blood Culture adequate volume   Culture   Final    NO GROWTH 5 DAYS Performed at [facility], [address] [place], [place] [address]    Report Status 06/04/2018 FINAL  Final  Culture, blood (routine x 2)     Status: None   Collection Time: 05/30/18  3:47 PM  Result Value Ref Range Status   Specimen Description BLOOD LEFT FOREARM  Final   Special Requests   Final    BOTTLES DRAWN AEROBIC AND ANAEROBIC Blood Culture adequate volume   Culture   Final    NO GROWTH 5 DAYS Performed at [facility], [address], [place], [place] [address]    Report Status 06/04/2018 FINAL  Final   Studies/Results: Dg Chest Port 1 View  Result Date: 06/03/2018 CLINICAL DATA:  Respiratory failure. EXAM: PORTABLE CHEST 1 VIEW COMPARISON:  Radiograph of May 31, 2018. FINDINGS: The heart size and mediastinal contours are within normal limits. Both lungs are clear. No pneumothorax or pleural effusion is noted. Endotracheal tube is unchanged in position. Stable position of right-sided PICC line with distal tip in expected position of the SVC. The  visualized skeletal structures are unremarkable. IMPRESSION: Stable support apparatus. No acute cardiopulmonary abnormality seen. Electronically Signed   By: Marijo Conception, M.D.   On: 06/03/2018 14:03    Medications:  Prior to Admission:  Medications Prior to Admission  Medication Sig Dispense Refill Last Dose  . Acetaminophen 500 MG coapsule Take 500 mg by mouth as needed for pain.    unknown  . amLODipine (NORVASC) 5 MG tablet TAKE 1 TABLET BY MOUTH DAILY. (Patient taking differently: Take 5 mg by mouth daily. ) 90 tablet 1 06/05/2018 at Unknown time  . atorvastatin (LIPITOR) 40 MG tablet Take 1 tablet (40 mg total) by mouth daily. 90 tablet 1 05/15/2018 at Unknown time  . benazepril (LOTENSIN) 40 MG tablet Take 1 tablet (40 mg total) by mouth 2 (two) times daily. 180 tablet 1 05/27/2018 at Unknown time  . betamethasone dipropionate 0.05 % lotion Apply topically 2 (two) times daily. (Patient taking differently: Apply 1 application topically 2 (two) times daily as needed (for irritation). ) 120 mL 0 unknown  . colchicine 0.6 MG tablet Take 1 tablet (0.6 mg total) by mouth daily. Continue daily until you have had no pain in your foot for 2 days.  Then stop. (Patient taking differently: Take 0.6 mg by mouth daily as needed (for gout pain as directed). Continue daily until you have had no pain in your foot for 2 days.  Then stop.) 20 tablet 0 unknown  . fenofibrate 160 MG tablet TAKE 1 TABLET EVERY DAY (Patient taking differently: Take 160 mg by mouth daily. ) 90 tablet 0 05/08/2018 at Unknown time  . furosemide (LASIX) 20 MG tablet TAKE 1 TABLET TWICE DAILY (Patient taking differently: Take 20 mg by mouth 2 (two) times daily. TAKE 1 TABLET TWICE DAILY) 180 tablet 1 05/31/2018 at Unknown time  . gabapentin (NEURONTIN) 400 MG capsule TAKE 2 CAPSULES BY MOUTH 2 (TWO) TIMES DAILY. (Patient taking differently: Take 800 mg by mouth 2 (two) times daily. ) 360 capsule 0 05/20/2018 at  Unknown time  . metFORMIN  (GLUCOPHAGE) 500 MG tablet Take 500 mg by mouth 2 (two) times daily with a meal.    05/31/2018 at Unknown time  . potassium chloride SA (K-DUR,KLOR-CON) 20 MEQ tablet TAKE 2 TABLETS TWICE DAILY (Patient taking differently: Take 40 mEq by mouth 2 (two) times daily. ) 360 tablet 1 06/03/2018 at Unknown time  . rivaroxaban (XARELTO) 20 MG TABS tablet Take 20 mg by mouth daily with supper.    05/13/2018 at Unknown time   Scheduled: . aspirin  300 mg Rectal Daily  . atorvastatin  80 mg Per Tube q1800  . chlorhexidine gluconate (MEDLINE KIT)  15 mL Mouth Rinse BID  . Chlorhexidine Gluconate Cloth  6 each Topical Daily  . Chlorhexidine Gluconate Cloth  6 each Topical Daily  . enoxaparin (LOVENOX) injection  80 mg Subcutaneous Q12H  . furosemide  40 mg Intravenous Once  . insulin aspart  0-20 Units Subcutaneous TID WC  . insulin aspart  0-5 Units Subcutaneous QHS  . ipratropium-albuterol  3 mL Nebulization Q6H  . mouth rinse  15 mL Mouth Rinse 10 times per day  . metoprolol tartrate  5 mg Intravenous Q6H  . sodium chloride flush  10-40 mL Intracatheter Q12H  . sodium chloride flush  3 mL Intravenous Q12H   Continuous: . Marland KitchenTPN (CLINIMIX-E) Adult 83 mL/hr at 06/04/18 1722  . sodium chloride 250 mL (05/31/18 1112)  . dextrose 5 % 1,000 mL with potassium chloride 40 mEq infusion 20 mL/hr at 06/05/18 0609  . famotidine (PEPCID) IV 20 mg (06/04/18 1018)   GOT:LXBWIO chloride, acetaminophen **OR** acetaminophen, albuterol, docusate, fentaNYL (SUBLIMAZE) injection, hydrALAZINE, midazolam, midazolam, ondansetron **OR** ondansetron (ZOFRAN) IV, polyvinyl alcohol, sodium chloride flush, sodium chloride flush, sodium chloride flush  Assesment: He was admitted with a stroke.  He initially did well then became much less responsive developed respiratory failure requiring intubation and mechanical ventilation.  He seems to have aspirated during that time and is being treated for aspiration pneumonia.  He has  remained on the ventilator since.  He is approaching need for tracheostomy.  He has history of chronic diastolic heart failure and he is positive by about 5 L.  I think is reasonable to give him a dose of Lasix today.  He had been getting Lasix but this was discontinued because of worsening renal function.  It is not clear where he is as far as the stroke is concerned.  He is required sedation.  He has atrial fib which is ongoing.  He is chronically anticoagulated  He has diabetes with neuropathy which is stable.  He has malnutrition and he is on TPN. Principal Problem:   CVA (cerebral vascular accident) [facility]) Active Problems:   Essential hypertension   Peripheral neuropathy   Hyperlipidemia   Hypokalemia   Diabetes mellitus with neuropathy (HCC)   Paroxysmal atrial fibrillation (HCC)   AKI (acute kidney injury) ([place])   Chronic diastolic congestive heart failure (HCC)   Chronic anticoagulation   Iron deficiency anemia due to chronic blood loss   Acute respiratory failure (HCC)   Aspiration pneumonia of both lower lobes due to gastric secretions (HCC)   Malnutrition of moderate degree   Pressure injury of skin   Goals of care, counseling/discussion   Palliative care by specialist   DNR (do not resuscitate) discussion   Protein-calorie malnutrition, severe   Hypernatremia   Hyperglycemia    Plan: Family still on board for extubation later today.  Will give him  a dose of Lasix.    LOS: 13 days   Jennyfer Nickolson L 06/05/2018, 8:31 AM

## 2018-06-05 NOTE — Progress Notes (Signed)
Paged Dr. Roderic Palau about patient possibly needing morphine more than every 2 hours. Patient RR climbing to the 30s and 40s. Dr. Roderic Palau would like for RR to be around 15. New order for Morphine drip; will start and monitor patient response.

## 2018-06-05 NOTE — Progress Notes (Signed)
PROGRESS NOTE    Keith Doyle  IOE:[id number] DOB: Jul 09, 1936 DOA: 05/30/2018 PCP: Janora Norlander, DO   Brief Narrative:   82 y/o male admitted to the hospital withleft sided weakness andacute right basal ganglia CVA.Shortly after admission, patient became increasingly lethargic and began having fevers. This was followed by development of respiratory distress and he became unresponsive. He was intubated for airway protection and respiratory distress.His decompensation was secondary to aspiration pneumonia.Marland Kitchen He is being treated with IV antibiotics, but has been slow to progress. Main barrier to weaning at this point has been his secretions, mental status as well as tracheal edema. Pulmonology and neurology following. Palliative care also following and after several conversations on goals of care, family has agreed to DNR and will plan on compassionate extubation on 9/30 with transition towards comfort care.  Use morphine for respiratory distress.   Assessment & Plan:  Principal Problem: CVA (cerebral vascular accident) [facility]) Active Problems: Essential hypertension Peripheral neuropathy Hyperlipidemia Hypokalemia Diabetes mellitus with neuropathy (HCC) Paroxysmal atrial fibrillation (HCC) AKI (acute kidney injury) ([place]) Chronic diastolic congestive heart failure (HCC) Chronic anticoagulation Iron deficiency anemia due to chronic blood loss Acute respiratory failure (HCC) Aspiration pneumonia of both lower lobes due to gastric secretions (HCC) Malnutrition of moderate degree Pressure injury of skin   1. Acute CVA. Likely secondary to embolism from atrial fibrillation.  Patient had presented with acute onset of LLE weakness. MRI brain confirmedright basal ganglia infarct. MRA head showedsome atherosclerotic disease on the left side. Carotid dopplers did not show any significant stenosis bilaterally. Echo did not show any significant findings.  Seen by physical therapy who recommended SNF. He is chronically on xarelto, but this has been switched to lovenox since he did not pass his swallow evaluation. Neurology has evaluated the patient. Patient has required minimal sedation on ventilatorandhas not beenvery responsive.Repeat CT head was unrevealing. EEGwith diffuse changes and no seizure activity noted.  He is fully anticoagulated with lovenox.    2. Acute respiratory failure with hypoxia. Persists.  Related to aspiration pneumonia.Shortly after admission, patient became unresponsive, febrile and developed respiratory distress. He was intubated for airway protection and respiratory distress on 9/18. He was startedon intravenous antibiotics, but unfortunately continues to have persistent fevers. He had 1 out of 2 positive blood cultures for coagulase-negative staph which is suspected to bea contaminant. Follow-up blood cultures did not show any growth.  Pulmonology following. His fever may be related to a central process, but appears to be improving with cooling blankets. Continue supportive therapy 3. Paroxysmal atrial fibrillation on chronic Xarelto. Currently on therapeutic dose Lovenox. Heart rate is currently stable on lopressor.  4. HLD. Statin currently on hold since he cannot take medication by mouth 5. Peripheral neuropathy. Gabapentin was placed on hold due to altered mental status. 6. Hypernatremia - resolved with hypotonic fluids.   7. Bilateral lower extremity edema. Mild elevation of BNP. Echo shows normal EF. Initially had good urine output with Lasix, but further diuretics were held due to increasing creatinine.  8. AKI. Likely related to diuretics. Resolved with IV fluids.  9. Diabetes. Metformin currently on hold.  Blood sugars have been running high.  He has been receiving sliding scale insulin 10. Goals of care.Palliative care following. Patient has multiple medical problems that are affecting his quality of  life. Mentally, he has not been responding well on the ventilator and has not been able to engage in weaning process. He also has significant swallowing issues which precipitated aspiration.  He is currently DNR.  Family is planning on compassionate extubation today.  They do not wish for patient to receive further Lovenox, CBGs or further needle sticks.  They wish to focus his care on comfort.  Will use morphine for respiratory distress.   DVT prophylaxis:Full dose Lovenox Code Status:DNR Family Communication:discussed with multiple children at the bedside Disposition Plan:Patient will remain in the ICU at this time for ventilator management.  Family is planning on compassionate extubation later today.  Anticipate in-hospital death   Consultants:  Neurology  Pulmonology  Palliative care  Procedures: Echo: - Left ventricle: The cavity size was normal. Wall thickness was increased increased in a pattern of mild to moderate LVH. Systolic function was normal. The estimated ejection fraction was in the range of 60% to 65%. Wall motion was normal; there were no regional wall motion abnormalities. - Aortic valve: Mildly calcified annulus. Trileaflet. - Aortic root: The aortic root was mildly ectatic. - Mitral valve: There was trivial regurgitation. - Left atrium: The atrium was moderately dilated. - Right atrium: The atrium was mildly dilated. Central venous pressure (est): 8 mm Hg. - Atrial septum: No defect or patent foramen ovale was identified. - Tricuspid valve: There was trivial regurgitation.  - Pericardium, extracardiac: There was no pericardial effusion.  Intubation 9/18 >  EEG on 9/23: This is recording shows moderate global slowing indicating moderate to global encephalopathy. 2.  There is also infrequent burst suppression pattern typically seen with potent sedation  Antimicrobials:   Zosyn 9/18 > 9/19  Unasyn 9/19 >9/23  Zosyn 9/23  (day6/7)->9/29  Subjective: Remains unresponsive on ventilator.  Objective: Vitals:   06/05/18 1200 06/05/18 1300 06/05/18 1400 06/05/18 1600  BP: 114/69 134/71  109/70  Pulse: (!) 44 (!) 50 78 64  Resp: (!) 34 (!) 28 (!) 34 (!) 26  Temp: (!) 100.9 F (38.3 C) (!) 101.3 F (38.5 C) (!) 101.7 F (38.7 C) (!) 102 F (38.9 C)  TempSrc:      SpO2: 99% 96% 99% 96%  Weight:      Height:        Intake/Output Summary (Last 24 hours) at 06/05/2018 1816 Last data filed at 06/05/2018 1420 Gross per 24 hour  Intake 1863.11 ml  Output 450 ml  Net 1413.11 ml   Filed Weights   06/03/18 0400 06/04/18 0353 06/05/18 0400  Weight: 80.8 kg 87.8 kg 83.6 kg    Examination:  General exam: Unresponsive on ventilator Respiratory system: Clear to auscultation. Respiratory effort normal. Cardiovascular system:RRR. No murmurs, rubs, gallops. Gastrointestinal system: Abdomen is nondistended, soft and nontender. No organomegaly or masses felt. Normal bowel sounds heard. Extremities: No C/C/E, +pedal pulses Skin: No rashes, lesions or ulcers Psychiatry: Unresponsive   Data Reviewed: I have personally reviewed following labs and imaging studies  CBC: Recent Labs  Lab 05/30/18 0337 05/31/18 0401 06/03/18 0359 06/04/18 0427 06/05/18 0354  WBC 6.4 7.3 10.9* 13.6* 12.5*  NEUTROABS 4.4  --  8.7*  --  10.5*  HGB 9.0* 9.3* 9.4* 10.1* 9.0*  HCT 29.7* 30.5* 31.9* 33.8* 29.4*  MCV 91.7 92.1 93.0 93.1 92.2  PLT 238 264 294 282 016   Basic Metabolic Panel: Recent Labs  Lab 05/30/18 0337  06/01/18 0411 06/02/18 0402 06/03/18 0359 06/04/18 0427 06/05/18 0354  NA 146*   < > 146* 146* 145 145 145  K 3.9   < > 3.5 3.9 4.3 3.7 3.9  CL 117*   < > 116* 117* 116* 115* 117*  CO2 21*   < > 23 23 20* 24 23  GLUCOSE 150*   < > 165* 231* 335* 316* 302*  BUN 15   < > 20 26* 34* 34* 33*  CREATININE 0.92   < > 0.76 0.87 1.13 0.86 0.88  CALCIUM 8.7*   < > 9.3 9.4 9.4 9.5 9.4  MG 2.2  --  2.2  --  2.2   --  2.2  PHOS 3.3  --  3.7  --   --   --  3.3   < > = values in this interval not displayed.   GFR: Estimated Creatinine Clearance: 73.5 mL/min (by C-G formula based on SCr of 0.88 mg/dL). Liver Function Tests: Recent Labs  Lab 05/30/18 0337 06/01/18 0411 06/03/18 0359 06/05/18 0354  AST '22 19 25 19  ' ALT '19 23 20 22  ' ALKPHOS 80 122 129* 118  BILITOT 0.6 0.3 0.4 0.4  PROT 5.6* 6.0* 5.8* 5.7*  ALBUMIN 2.5* 2.5* 2.3* 2.0*   No results for input(s): LIPASE, AMYLASE in the last 168 hours. No results for input(s): AMMONIA in the last 168 hours. Coagulation Profile: No results for input(s): INR, PROTIME in the last 168 hours. Cardiac Enzymes: No results for input(s): CKTOTAL, CKMB, CKMBINDEX, TROPONINI in the last 168 hours. BNP (last 3 results) No results for input(s): PROBNP in the last 8760 hours. HbA1C: No results for input(s): HGBA1C in the last 72 hours. CBG: Recent Labs  Lab 06/04/18 1633 06/04/18 2025 06/04/18 2309 06/05/18 0419 06/05/18 0754  GLUCAP 163* 150* 185* 273* 314*   Lipid Profile: Recent Labs    06/05/18 0355  TRIG 182*   Thyroid Function Tests: No results for input(s): TSH, T4TOTAL, FREET4, T3FREE, THYROIDAB in the last 72 hours. Anemia Panel: No results for input(s): VITAMINB12, FOLATE, FERRITIN, TIBC, IRON, RETICCTPCT in the last 72 hours. Sepsis Labs: No results for input(s): PROCALCITON, LATICACIDVEN in the last 168 hours.  Recent Results (from the past 240 hour(s))  Culture, blood (routine x 2)     Status: None   Collection Time: 05/30/18  3:10 PM  Result Value Ref Range Status   Specimen Description BLOOD LEFT ANTECUBITAL  Final   Special Requests   Final    BOTTLES DRAWN AEROBIC AND ANAEROBIC Blood Culture adequate volume   Culture   Final    NO GROWTH 5 DAYS Performed at [facility], [address], [place], [place] [address]    Report Status 06/04/2018 FINAL  Final  Culture, blood (routine x 2)     Status: None   Collection  Time: 05/30/18  3:47 PM  Result Value Ref Range Status   Specimen Description BLOOD LEFT FOREARM  Final   Special Requests   Final    BOTTLES DRAWN AEROBIC AND ANAEROBIC Blood Culture adequate volume   Culture   Final    NO GROWTH 5 DAYS Performed at [facility], [address], [place], [place] [address]    Report Status 06/04/2018 FINAL  Final   Radiology Studies: No results found.  Scheduled Meds: . atropine  2 drop Sublingual TID  . chlorhexidine gluconate (MEDLINE KIT)  15 mL Mouth Rinse BID  . Chlorhexidine Gluconate Cloth  6 each Topical Daily  . Chlorhexidine Gluconate Cloth  6 each Topical Daily  . insulin aspart  0-20 Units Subcutaneous TID WC  . insulin aspart  0-5 Units Subcutaneous QHS  . mouth rinse  15 mL Mouth Rinse 10 times per day  . metoprolol tartrate  5 mg Intravenous Q6H  . sodium chloride flush  10-40 mL Intracatheter Q12H  . sodium chloride flush  3 mL Intravenous Q12H   Continuous Infusions: . sodium chloride 250 mL (05/31/18 1112)  . dextrose 5 % 1,000 mL with potassium chloride 40 mEq infusion Stopped (06/05/18 1151)  . morphine 2 mg/hr (06/05/18 1728)     LOS: 13 days    Time spent: 27mns  JKathie Dike MD [facility] Pager [phone] If 7PM-7AM, please contact night-coverage [email or web address] Password [facility]9/30/2019, 6:16 PM

## 2018-06-05 NOTE — Progress Notes (Addendum)
Patients et tube has migrated some. Now at 23. Saturation down to 95, probably fluid. Suspect he will need to diurese some of the fluid before extubation. He does awaken.

## 2018-06-05 NOTE — Progress Notes (Signed)
Drip increased at the behest of the patient's family in response to the patient's RR and effort.

## 2018-06-06 ENCOUNTER — Encounter: Payer: Medicare HMO | Admitting: *Deleted

## 2018-06-06 MED ORDER — LORAZEPAM 2 MG/ML IJ SOLN
0.5000 mg | INTRAMUSCULAR | Status: DC | PRN
  Administered 2018-06-06: 0.5 mg via INTRAVENOUS
  Filled 2018-06-06: qty 1

## 2018-06-06 DEATH — deceased

## 2018-06-08 ENCOUNTER — Other Ambulatory Visit: Payer: Self-pay

## 2018-06-08 ENCOUNTER — Ambulatory Visit: Payer: Medicare HMO

## 2018-06-08 NOTE — Patient Outreach (Signed)
Keith Doyle [facility]) Care Management  06/08/2018  [name] 02-14-1936 [id number]   Case closed. Per chart review patient deceased.  PLAN;  RNCM will close case RNCM will send notification to primary MD of closure.   Quinn Plowman RN,BSN,CCM [facility] Telephonic  [phone]

## 2018-06-30 ENCOUNTER — Ambulatory Visit: Payer: Medicare HMO | Admitting: Family Medicine

## 2018-07-07 NOTE — Discharge Summary (Signed)
Death Summary  Keith Doyle WVP:[id number] DOB: [birth year] DOA: 06/14/2018  PCP: Janora Norlander, DO  Admit date: Jun 06, 2018 Date of Death: Jun 20, 2018 Time of Death: 9:22AM  History of present illness:  Keith Doyle is a 82 y.o. male with a history of HTN, DM, Chronic diastolic CHF, PAF, admitted to the hospital with left sided weakness and found to have an acute CVA. Shortly after admission, patient likely aspirated secondary to dysphagia and developed respiratory distress. He became unresponsive and was intubated for respiratory distress and airway protection. He has a prolonged course on the ventilator. He was treated with intravenous antibiotics and serial chest xrays showed improvement of aspiration pneumonia. Blood cultures did not show any significant growth. Despite 10 days of IV antibiotics, he continued to have a fevers, suggesting a noninfectious source. His mental status did not improve. He remained unresponsive and did not make any purposeful movements. Repeat CT head and EEG were done that did not show any significant findings. He was followed by pulmonology and neurology. Unfortunately, due to failure of improvement in his mental status, he was unable to participate in weaning trials. Palliative care met with family and discussed goals of care. He was made DNR and decision was made to provide compassionate extubation on 9/30. Post extubation, patient required morphine for respiratory distress and eventually was started on a morphine infusion to help control his symptoms. His mental status remained unresponsive. On 21-Jun-2023, he was found to be without respirations or heart sounds. He was pronounced dead at that time. Family was at the bedside and was provided support.  Final Diagnoses:  Principal Problem:   CVA (cerebral vascular accident) [facility]) Active Problems:   Essential hypertension   Peripheral neuropathy   Hyperlipidemia   Hypokalemia   Diabetes mellitus with neuropathy (HCC)  Paroxysmal atrial fibrillation (HCC)   AKI (acute kidney injury) ([place])   Chronic diastolic congestive heart failure (HCC)   Chronic anticoagulation   Iron deficiency anemia due to chronic blood loss   Acute respiratory failure (HCC)   Aspiration pneumonia of both lower lobes due to gastric secretions (HCC)   Malnutrition of moderate degree   Pressure injury of skin   Goals of care, counseling/discussion   Palliative care by specialist   DNR (do not resuscitate) discussion   Protein-calorie malnutrition, severe   Hypernatremia   Hyperglycemia    The results of significant diagnostics from this hospitalization (including imaging, microbiology, ancillary and laboratory) are listed below for reference.    Significant Diagnostic Studies: Dg Chest 2 View  Result Date: 06/15/2018 CLINICAL DATA:  Altered mental status EXAM: CHEST - 2 VIEW COMPARISON:  05/17/2017, CT 12/01/2010. FINDINGS: Suspected tiny left effusionSlightly enlarged cardiomediastinal silhouette with aortic atherosclerosis. No pneumothorax. Moderate to large hiatal hernia. Degenerative changes of the spine IMPRESSION: Mild cardiomegaly with suspected tiny left effusion. Moderate to large hiatal hernia. Electronically Signed   By: Donavan Foil M.D.   On: 06/15/2018 18:59   Dg Abd 1 View  Result Date: 05/26/2018 CLINICAL DATA:  OG tube placement. EXAM: ABDOMEN - 1 VIEW COMPARISON:  CT scan 09/01/2010 FINDINGS: Supine view the abdomen shows a nonspecific bowel gas pattern. On the CT scan from 8 years ago, the patient had a large hiatal hernia. Recent chest x-rays suggest persistence of the hiatal hernia. A similar the presence of a hiatal hernia, the OG tube is likely folded on itself within the hiatal hernia in the tube appears kinked at the level of the proximal port. IVC  filter visualized in situ IMPRESSION: Assuming moderate to large hiatal hernia as noted on previous CT of 09/01/2010, the OG tube is probably folded on itself  within the hernia and kinked at the level of the proximal port. Electronically Signed   By: Misty Stanley M.D.   On: 05/26/2018 13:43   Ct Head Wo Contrast  Result Date: 05/27/2018 CLINICAL DATA:  82 year old male with recent right basal ganglia lacunar infarct. Altered mental status, increasing lethargy and fever. EXAM: CT HEAD WITHOUT CONTRAST TECHNIQUE: Contiguous axial images were obtained from the base of the skull through the vertex without intravenous contrast. COMPARISON:  Brain MRI 05/24/2018.  Head CT 05/21/2018. FINDINGS: Brain: Progressed hypodensity in the right basal ganglia compatible with cytotoxic edema related to the recent infarct. This appears stable to the diffusion abnormality on 05/24/2018. No associated hemorrhage. Minimal associated mass effect on the adjacent right lateral ventricle. No new cytotoxic edema identified. Stable gray-white matter differentiation elsewhere. No intracranial hemorrhage or ventriculomegaly. 15 millimeter calcified right insula region meningioma again noted. Much smaller left anterior convexity meningioma is also stable. Vascular: Calcified atherosclerosis at the skull base. No suspicious intracranial vascular hyperdensity. Skull: No acute osseous abnormality identified. Sinuses/Orbits: Visualized paranasal sinuses and mastoids are stable and well pneumatized. Other: No acute orbit or scalp soft tissue finding. Partially visible endotracheal tube in the oral cavity. IMPRESSION: 1. Expected evolution of the recent right basal ganglia infarct with no associated hemorrhage and minimal mass effect. 2. No new acute intracranial abnormality identified. Electronically Signed   By: Genevie Ann M.D.   On: 05/27/2018 10:53   Ct Head Wo Contrast  Result Date: 05/14/2018 CLINICAL DATA:  Altered LOC EXAM: CT HEAD WITHOUT CONTRAST TECHNIQUE: Contiguous axial images were obtained from the base of the skull through the vertex without intravenous contrast. COMPARISON:   08/11/2013 head CT FINDINGS: Brain: No hemorrhage is visualized. Hypodensity within the right basal ganglia, new as compared with 2014 head CT, consistent with age indeterminate lacunar infarct. 5 mm partially calcified left frontal extra-axial mass likely a small meningioma. Stable calcified extra-axial 15 mm mass in the right middle cranial fossa consistent with meningioma. Moderate atrophy. Moderate small vessel ischemic changes of the white matter. Prominent ventricles are felt related to atrophy. Vascular: No hyperdense vessels. Vertebral and carotid vascular calcification Skull: Normal. Negative for fracture or focal lesion. Sinuses/Orbits: No acute abnormality. Mucosal thickening in the ethmoid sinuses. Small osteoma left ethmoid sinus Other: None IMPRESSION: 1. Hypodensity within the right basal ganglia consistent with age indeterminate lacunar infarct, new since 2014 2. Atrophy with small vessel ischemic changes of the white matter 3. Stable right convexity partially calcified meningioma. Additional small left frontal convexity partially calcified extra-axial mass, likely small meningioma. Electronically Signed   By: Donavan Foil M.D.   On: 05/18/2018 19:05   Mr Jodene Nam Head Wo Contrast  Result Date: 05/24/2018 CLINICAL DATA:  Altered mental status and left lower extremity weakness. EXAM: MRI HEAD WITHOUT CONTRAST MRA HEAD WITHOUT CONTRAST TECHNIQUE: Multiplanar, multiecho pulse sequences of the brain and surrounding structures were obtained without intravenous contrast. Angiographic images of the head were obtained using MRA technique without contrast. COMPARISON:  Head CT 05/14/2018.  Head MRI/MRA 07/28/2010. FINDINGS: MRI HEAD FINDINGS Brain: There is a 3 cm acute infarct involving the right lentiform and caudate nuclei. No intracranial hemorrhage, midline shift, or extra-axial fluid collection is identified. Patchy cerebral white matter T2 hyperintensities have mildly progressed from 2011 and are  nonspecific but compatible with moderate chronic  small vessel ischemic disease. There is moderate cerebral atrophy. A 1.3 cm calcified extra-axial mass compatible with a meningioma at the level of the right sylvian fissure is unchanged from 2011 and does not result in significant mass effect or brain edema. 5 mm focus of extra-axial calcification over the left frontal convexity may also represent an incidental meningioma without mass effect or edema. Vascular: Major intracranial vascular flow voids are preserved. Skull and upper cervical spine: Unremarkable bone marrow signal. Sinuses/Orbits: Bilateral cataract extraction. Trace bilateral mastoid effusions. Clear paranasal sinuses. Other: None. MRA HEAD FINDINGS The visualized distal vertebral arteries are patent to the basilar with mild distal V4 stenosis noted on the left. There is irregularity of the proximal right V4 segment without significant stenosis. Patent left PICA, bilateral AICA, and bilateral SCA origins are identified. The right PICA origin may be slightly proximal to the imaging volume. The basilar artery is widely patent. There is a small left posterior communicating artery. The PCAs are patent proximally with a new severe proximal left P1 stenosis. There is signal loss in the left greater than right P2 segments which is felt to be at least partly artifactual due to the course of the vessels with presumed artifactual left P2 signal loss also present on the prior MRA. No significant P1 or proximal P2 stenosis is evident on the right. The internal carotid arteries are widely patent from skull base to carotid termini. ACAs and MCAs are patent without evidence of proximal branch occlusion or flow limiting proximal stenosis. No aneurysm is identified. IMPRESSION: 1. Acute right basal ganglia infarct. 2. Moderate chronic small vessel ischemic disease and cerebral atrophy. 3. Incidental small meningiomas without mass effect or edema. 4. Patent circle of  Willis without large vessel occlusion. 5. New severe left P1 stenosis and mild distal left V4 stenosis. Electronically Signed   By: Logan Bores M.D.   On: 05/24/2018 10:28   Mr Brain Wo Contrast  Result Date: 05/24/2018 CLINICAL DATA:  Altered mental status and left lower extremity weakness. EXAM: MRI HEAD WITHOUT CONTRAST MRA HEAD WITHOUT CONTRAST TECHNIQUE: Multiplanar, multiecho pulse sequences of the brain and surrounding structures were obtained without intravenous contrast. Angiographic images of the head were obtained using MRA technique without contrast. COMPARISON:  Head CT 05/16/2018.  Head MRI/MRA 07/28/2010. FINDINGS: MRI HEAD FINDINGS Brain: There is a 3 cm acute infarct involving the right lentiform and caudate nuclei. No intracranial hemorrhage, midline shift, or extra-axial fluid collection is identified. Patchy cerebral white matter T2 hyperintensities have mildly progressed from 2011 and are nonspecific but compatible with moderate chronic small vessel ischemic disease. There is moderate cerebral atrophy. A 1.3 cm calcified extra-axial mass compatible with a meningioma at the level of the right sylvian fissure is unchanged from 2011 and does not result in significant mass effect or brain edema. 5 mm focus of extra-axial calcification over the left frontal convexity may also represent an incidental meningioma without mass effect or edema. Vascular: Major intracranial vascular flow voids are preserved. Skull and upper cervical spine: Unremarkable bone marrow signal. Sinuses/Orbits: Bilateral cataract extraction. Trace bilateral mastoid effusions. Clear paranasal sinuses. Other: None. MRA HEAD FINDINGS The visualized distal vertebral arteries are patent to the basilar with mild distal V4 stenosis noted on the left. There is irregularity of the proximal right V4 segment without significant stenosis. Patent left PICA, bilateral AICA, and bilateral SCA origins are identified. The right PICA origin  may be slightly proximal to the imaging volume. The basilar artery is widely patent.  There is a small left posterior communicating artery. The PCAs are patent proximally with a new severe proximal left P1 stenosis. There is signal loss in the left greater than right P2 segments which is felt to be at least partly artifactual due to the course of the vessels with presumed artifactual left P2 signal loss also present on the prior MRA. No significant P1 or proximal P2 stenosis is evident on the right. The internal carotid arteries are widely patent from skull base to carotid termini. ACAs and MCAs are patent without evidence of proximal branch occlusion or flow limiting proximal stenosis. No aneurysm is identified. IMPRESSION: 1. Acute right basal ganglia infarct. 2. Moderate chronic small vessel ischemic disease and cerebral atrophy. 3. Incidental small meningiomas without mass effect or edema. 4. Patent circle of Willis without large vessel occlusion. 5. New severe left P1 stenosis and mild distal left V4 stenosis. Electronically Signed   By: Logan Bores M.D.   On: 05/24/2018 10:28   US Carotid Bilateral (at [facility] Only)  Result Date: 05/24/2018 CLINICAL DATA:  82 year old male with a history weakness. Cardiovascular risk factors include hypertension, stroke/TIA, coronary disease, hyperlipidemia, diabetes EXAM: BILATERAL CAROTID DUPLEX ULTRASOUND TECHNIQUE: Pearline Cables scale imaging, color Doppler and duplex ultrasound were performed of bilateral carotid and vertebral arteries in the neck. COMPARISON:  None. FINDINGS: Criteria: Quantification of carotid stenosis is based on velocity parameters that correlate the residual internal carotid diameter with NASCET-based stenosis levels, using the diameter of the distal internal carotid lumen as the denominator for stenosis measurement. The following velocity measurements were obtained: RIGHT ICA:  Systolic 57 cm/sec, Diastolic 17 cm/sec CCA:  96 cm/sec SYSTOLIC ICA/CCA  RATIO:  0.6 ECA:  87 cm/sec LEFT ICA:  Systolic 60 cm/sec, Diastolic 16 cm/sec CCA:  94 cm/sec SYSTOLIC ICA/CCA RATIO:  0.6 ECA:  76 cm/sec Right Brachial SBP: Not acquired Left Brachial SBP: Not acquired RIGHT CAROTID ARTERY: No significant calcifications of the right common carotid artery. Intermediate waveform maintained. Heterogeneous and partially calcified plaque at the right carotid bifurcation. No significant lumen shadowing. Low resistance waveform of the right ICA. No significant tortuosity. RIGHT VERTEBRAL ARTERY: Antegrade flow with low resistance waveform. LEFT CAROTID ARTERY: No significant calcifications of the left common carotid artery. Intermediate waveform maintained. Heterogeneous and partially calcified plaque at the left carotid bifurcation without significant lumen shadowing. Low resistance waveform of the left ICA. No significant tortuosity. LEFT VERTEBRAL ARTERY:  Antegrade flow with low resistance waveform. IMPRESSION: Color duplex indicates minimal heterogeneous and calcified plaque, with no hemodynamically significant stenosis by duplex criteria in the extracranial cerebrovascular circulation. Signed, Dulcy Fanny. Dellia Nims, RPVI [facility] Radiology Electronically Signed   By: Corrie Mckusick D.O.   On: 05/24/2018 10:46   Dg Chest Port 1 View  Result Date: 06/03/2018 CLINICAL DATA:  Respiratory failure. EXAM: PORTABLE CHEST 1 VIEW COMPARISON:  Radiograph of May 31, 2018. FINDINGS: The heart size and mediastinal contours are within normal limits. Both lungs are clear. No pneumothorax or pleural effusion is noted. Endotracheal tube is unchanged in position. Stable position of right-sided PICC line with distal tip in expected position of the SVC. The visualized skeletal structures are unremarkable. IMPRESSION: Stable support apparatus. No acute cardiopulmonary abnormality seen. Electronically Signed   By: Marijo Conception, M.D.   On:  06/03/2018 14:03   Dg Chest Port 1 View  Result Date: 05/31/2018 CLINICAL DATA:  Respiratory failure. EXAM: PORTABLE CHEST 1 VIEW COMPARISON:  05/30/2018. FINDINGS:  Endotracheal tube in satisfactory position. Right PICC tip in the superior vena cava, 3 cm above the superior cavoatrial junction. Mild left basilar atelectasis with a more linear component today. Clear right lung. Mild peribronchial thickening. Normal sized heart with a mild decrease in size. Moderately large hiatal hernia. IMPRESSION: 1. Mild left basilar atelectasis and mild bronchitic changes. 2. Moderately large hiatal hernia. Electronically Signed   By: Claudie Revering M.D.   On: 05/31/2018 10:56   Dg Chest Port 1 View  Result Date: 05/30/2018 CLINICAL DATA:  Status post PICC line placement. EXAM: PORTABLE CHEST 1 VIEW COMPARISON:  Portable chest x-ray of May 29, 2018 FINDINGS: The right-sided PICC line has its tip terminating in the region of the midportion of the SVC. No immediate postprocedure complication is observed. The lungs are adequately inflated. The interstitial markings remain coarse. There is hazy increased density at the left lung base which is not new and has in fact improved since yesterday's study. The heart and pulmonary vascularity are normal. The endotracheal tube tip projects approximately 5.2 cm above the carina. IMPRESSION: Good positioning of the right PICC line with the tip projecting over the midportion of the SVC. Slight improvement in the appearance of left basilar atelectasis or pneumonia. Electronically Signed   By: David  Martinique M.D.   On: 05/30/2018 15:01   Dg Chest Port 1 View  Result Date: 05/29/2018 CLINICAL DATA:  Respiratory failure EXAM: PORTABLE CHEST 1 VIEW COMPARISON:  9/21/9 FINDINGS: Cardiac shadow is stable. Increasing left retrocardiac atelectatic changes are noted. Right lung remains clear. Endotracheal tube is noted in satisfactory position. No bony abnormality is seen. IMPRESSION:  Increasing left retrocardiac atelectasis. Electronically Signed   By: Inez Catalina M.D.   On: 05/29/2018 07:28   Dg Chest Port 1 View  Result Date: 05/27/2018 CLINICAL DATA:  Respiratory failure. EXAM: PORTABLE CHEST 1 VIEW COMPARISON:  Chest x-rays dated 05/26/2018 and 05/25/2018 FINDINGS: Endotracheal tube is 5 cm above the carina. Heart size and pulmonary vascularity are normal. There is a small patchy area of infiltrate at the left lung base. Lungs are otherwise clear. IMPRESSION: 1. Endotracheal tube in good position. 2. Small patchy infiltrate at the left lung base, improved since the study of 05/25/2018. Electronically Signed   By: Lorriane Shire M.D.   On: 05/27/2018 08:53   Dg Chest Port 1 View  Result Date: 05/26/2018 CLINICAL DATA:  Respiratory failure EXAM: PORTABLE CHEST 1 VIEW COMPARISON:  05/25/2018 FINDINGS: Cardiac shadow is within normal limits. Endotracheal tube is noted in satisfactory position. The lungs are well aerated bilaterally. Mild vascular congestion is seen. Patchy infiltrates are again identified in the bases. No new focal infiltrate is seen. No bony abnormality is noted. IMPRESSION: Stable bibasilar infiltrates. Mild vascular congestion. Electronically Signed   By: Inez Catalina M.D.   On: 05/26/2018 07:24   Portable Chest Xray  Result Date: 05/25/2018 CLINICAL DATA:  Respiratory failure, atrial fibrillation EXAM: PORTABLE CHEST 1 VIEW COMPARISON:  Portable chest x-ray of 05/24/2018 and 05/22/2018 FINDINGS: Opacities at the lung bases are increasing most consistent with pneumonia with possible some atelectasis. No definite effusion is seen. The tip of the endotracheal tube is approximately 5.8 cm above the carina. Mediastinal and hilar contours are unremarkable and the descending thoracic aorta is ectatic. The heart is within normal limits in size. As noted previously the NG tube may be coiling within a hiatal hernia. Abdominal films would be helpful to assess further.  IMPRESSION: 1. Increasing bibasilar opacities most  consistent with pneumonia. Consider follow-up. 2. NG tube appears to be coiling within a hiatal hernia. Abdominal films would be helpful. 3. Tip of endotracheal tube is approximately 5.8 cm above the carina. Electronically Signed   By: Ivar Drape M.D.   On: 05/25/2018 09:24   Dg Chest Port 1 View  Result Date: 05/24/2018 CLINICAL DATA:  hypoxia EXAM: PORTABLE CHEST 1 VIEW COMPARISON:  1 day prior FINDINGS: Endotracheal tube terminates 5.8 cm above carina.Nasogastric tube is difficult to follow distally. Favored to be looped in a hiatal hernia. Mild cardiomegaly. No pleural effusion or pneumothorax. Mildly worsened left base aeration. IMPRESSION: Endotracheal tube appropriately positioned. Nasogastric tube is likely looped within a hiatal hernia. It is difficult to follow distally. Consider dedicated abdominal radiographs either after repositioning or more acutely. Worsened left-sided aeration which could represent progressive atelectasis or developing infection/aspiration. Electronically Signed   By: Abigail Miyamoto M.D.   On: 05/24/2018 19:09   Dg Chest Port 1v Same Day  Result Date: 05/26/2018 CLINICAL DATA:  OG tube placement EXAM: PORTABLE CHEST 1 VIEW COMPARISON:  None. FINDINGS: Endotracheal tube with the tip 5.2 cm above the carina. Nasogastric tube visualized to the level of the diaphragm, but not seen below the diaphragm secondary to technique. Recommend a KUB for re-evaluation. No focal consolidation, pleural effusion or pneumothorax. Stable cardiomediastinal silhouette. Severe osteoarthritis of the right glenohumeral joint. No acute osseous abnormality. IMPRESSION: 1. Endotracheal tube with the tip 5.2 cm above the carina. 2. Nasogastric tube visualized to the level of the diaphragm, but not seen below the diaphragm secondary to technique. Recommend a KUB for re-evaluation. Electronically Signed   By: Kathreen Devoid   On: 05/26/2018 11:12     Microbiology: Recent Results (from the past 240 hour(s))  Culture, blood (routine x 2)     Status: None   Collection Time: 05/30/18  3:10 PM  Result Value Ref Range Status   Specimen Description BLOOD LEFT ANTECUBITAL  Final   Special Requests   Final    BOTTLES DRAWN AEROBIC AND ANAEROBIC Blood Culture adequate volume   Culture   Final    NO GROWTH 5 DAYS Performed at [facility], [address] [place], [place] [address]    Report Status 06/04/2018 FINAL  Final  Culture, blood (routine x 2)     Status: None   Collection Time: 05/30/18  3:47 PM  Result Value Ref Range Status   Specimen Description BLOOD LEFT FOREARM  Final   Special Requests   Final    BOTTLES DRAWN AEROBIC AND ANAEROBIC Blood Culture adequate volume   Culture   Final    NO GROWTH 5 DAYS Performed at [facility], [address] [place], [place] [address]    Report Status 06/04/2018 FINAL  Final     Labs: Basic Metabolic Panel: Recent Labs  Lab 06/01/18 0411 06/02/18 0402 06/03/18 0359 06/04/18 0427 06/05/18 0354  NA 146* 146* 145 145 145  K 3.5 3.9 4.3 3.7 3.9  CL 116* 117* 116* 115* 117*  CO2 23 23 20* 24 23  GLUCOSE 165* 231* 335* 316* 302*  BUN 20 26* 34* 34* 33*  CREATININE 0.76 0.87 1.13 0.86 0.88  CALCIUM 9.3 9.4 9.4 9.5 9.4  MG 2.2  --  2.2  --  2.2  PHOS 3.7  --   --   --  3.3   Liver Function Tests: Recent Labs  Lab 06/01/18 0411 06/03/18 0359 06/05/18 0354  AST 19 25 19  ALT _0 ALKPHOS 122 129* 118  BILITOT 0.3 0.4 0.4  PROT 6.0* 5.8* 5.7*  ALBUMIN 2.5* 2.3* 2.0*   No results for input(s): LIPASE, AMYLASE in the last 168 hours. No results for input(s): AMMONIA in the last 168 hours. CBC: Recent Labs  Lab 05/31/18 0401 06/03/18 0359 06/04/18 0427 06/05/18 0354  WBC 7.3 10.9* 13.6* 12.5*  NEUTROABS  --  8.7*  --  10.5*  HGB 9.3* 9.4* 10.1* 9.0*  HCT 30.5* 31.9* 33.8* 29.4*  MCV 92.1 93.0 93.1 92.2  PLT 264 294 282 255   Cardiac Enzymes: No results for  input(s): CKTOTAL, CKMB, CKMBINDEX, TROPONINI in the last 168 hours. D-Dimer No results for input(s): DDIMER in the last 72 hours. BNP: Invalid input(s): POCBNP CBG: Recent Labs  Lab 06/04/18 1633 06/04/18 2025 06/04/18 2309 06/05/18 0419 06/05/18 0754  GLUCAP 163* 150* 185* 273* 314*   Anemia work up No results for input(s): VITAMINB12, FOLATE, FERRITIN, TIBC, IRON, RETICCTPCT in the last 72 hours. Urinalysis    Component Value Date/Time   COLORURINE STRAW (A) 05/08/2018 1740   APPEARANCEUR CLEAR 06/05/2018 1740   APPEARANCEUR Clear 07/16/2016 1754   LABSPEC 1.005 05/19/2018 1740   PHURINE 6.0 05/21/2018 1740   GLUCOSEU NEGATIVE 05/21/2018 1740   HGBUR NEGATIVE 05/28/2018 1740   BILIRUBINUR NEGATIVE 05/14/2018 1740   BILIRUBINUR Negative 07/16/2016 1754   KETONESUR NEGATIVE 05/30/2018 1740   PROTEINUR NEGATIVE 05/07/2018 1740   UROBILINOGEN 0.2 08/30/2011 0540   NITRITE NEGATIVE 06/03/2018 1740   LEUKOCYTESUR NEGATIVE 05/09/2018 1740   LEUKOCYTESUR Negative 07/16/2016 1754   Sepsis Labs Invalid input(s): PROCALCITONIN,  WBC,  LACTICIDVEN     SIGNED:  Kathie Dike, MD  [facility] 18-Jun-2018, 10:13 AM Pager   If 7PM-7AM, please contact night-coverage [email or web address] Password [email or web address]

## 2018-07-07 NOTE — Progress Notes (Signed)
He is now on comfort care.  I have added Ativan.  Discussed with multiple family members at bedside.  I will officially sign off but see him socially

## 2018-07-07 DEATH — deceased

## 2019-08-11 NOTE — Progress Notes (Signed)
REVIEWED-NO ADDITIONAL RECOMMENDATIONS. 

## 2020-09-18 IMAGING — CT CT HEAD W/O CM
3 series · 14 of 47 positions shown, 16 images · non-contrast
Comparison: Brain MRI 05/24/2018.  Head CT 05/23/2018.

CLINICAL DATA: 82-year-old male with recent right basal ganglia
lacunar infarct. Altered mental status, increasing lethargy and
fever.

EXAM:
CT HEAD WITHOUT CONTRAST
TECHNIQUE: Contiguous axial images were obtained from the base of the skull
through the vertex without intravenous contrast.

[Series 2: head wo · axial · 0.43mm/px · z∈[+928,+1053]mm · 8 of 31 slices shown, 10 images]
[im 3/31  brain]
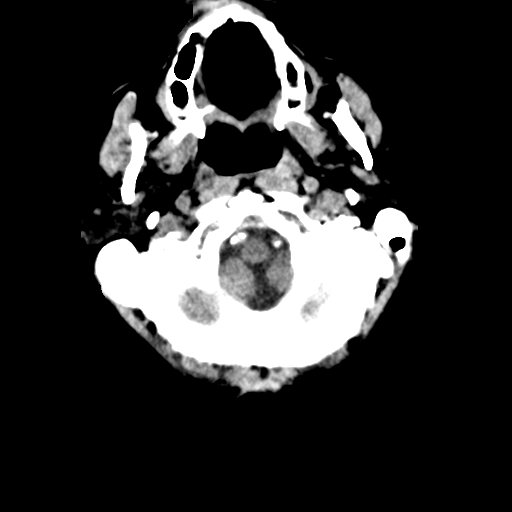
[im 3/31  bone]
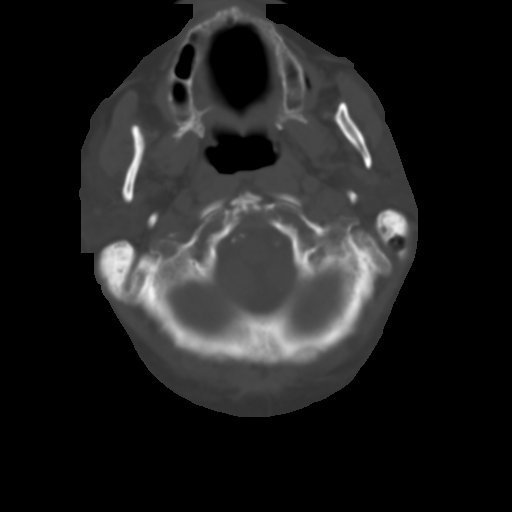
[im 7/31  brain]
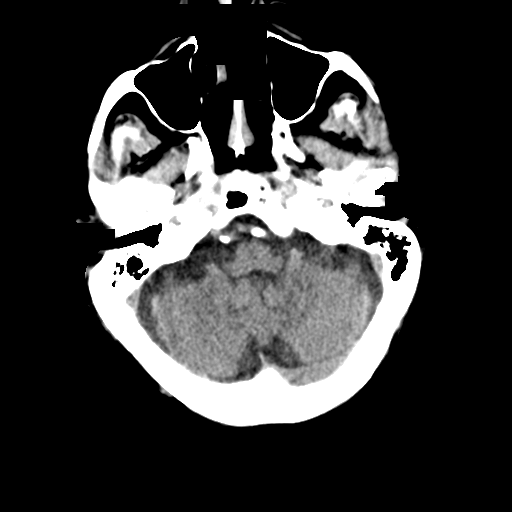
[im 10/31  brain]
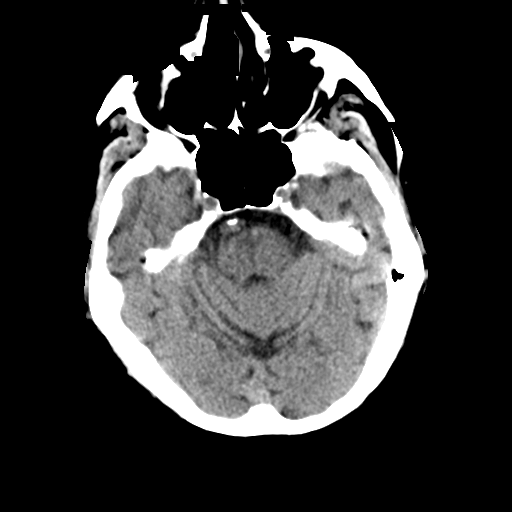
[im 14/31  brain]
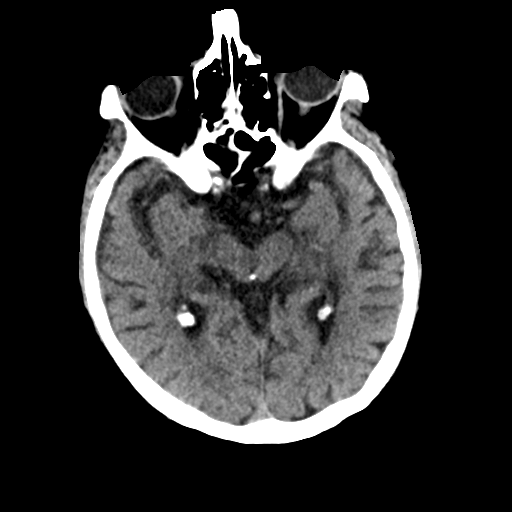
[im 17/31  brain]
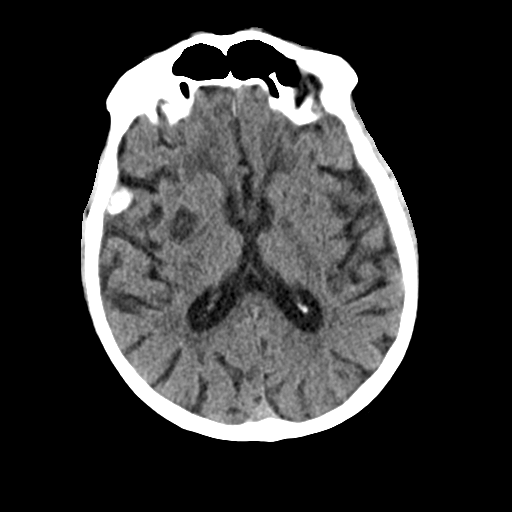
[im 17/31  bone]
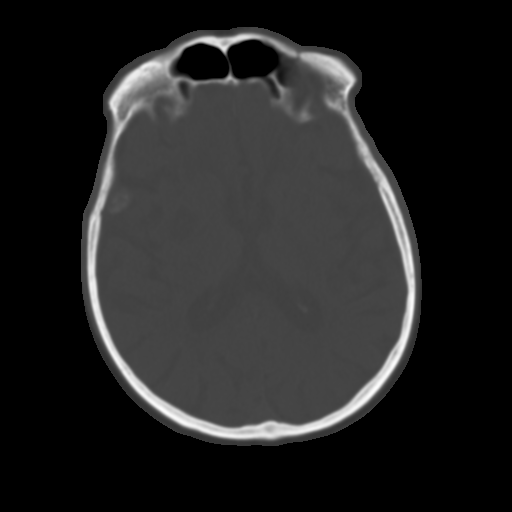
[im 21/31  brain]
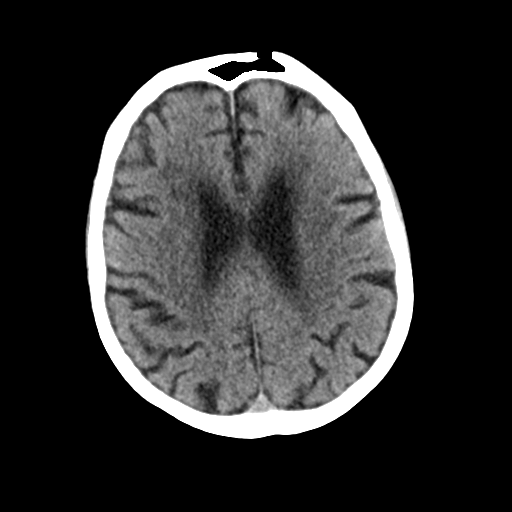
[im 24/31  brain]
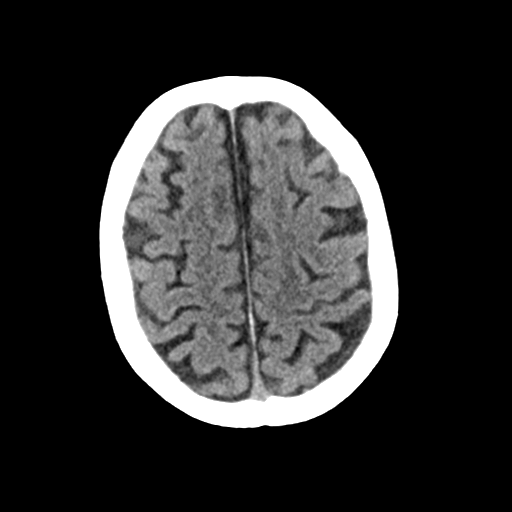
[im 28/31  brain]
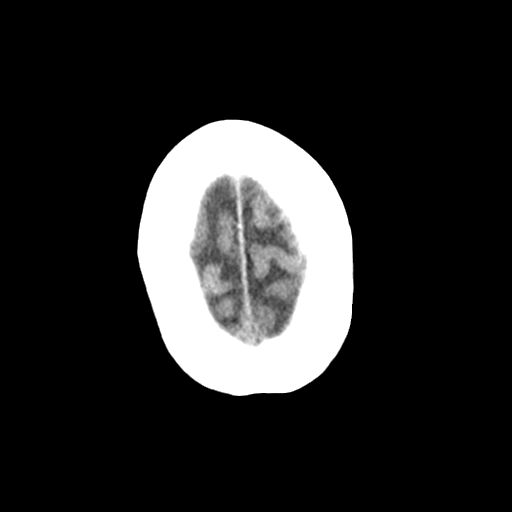

[Series 4: coronal soft tissue · coronal · 0.27mm/px · 3 of 58 slices shown]
[im 21/58  brain]
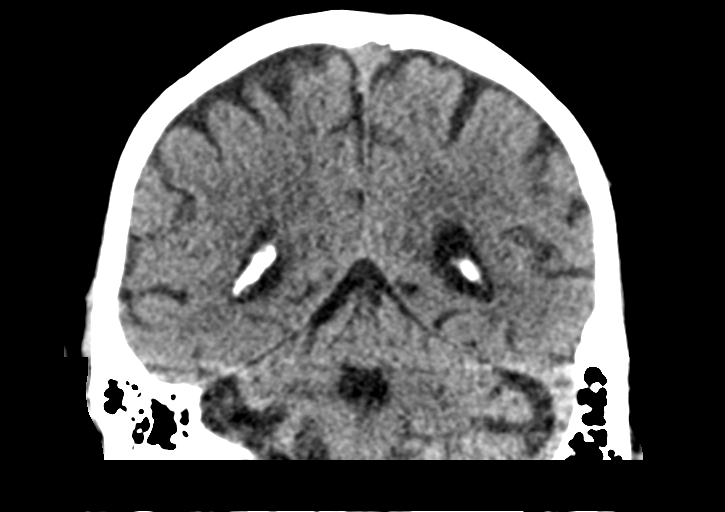
[im 26/58  brain]
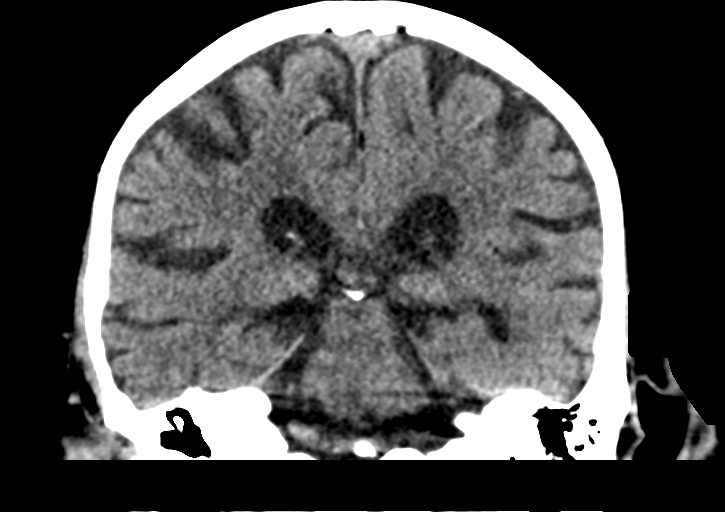
[im 32/58  brain]
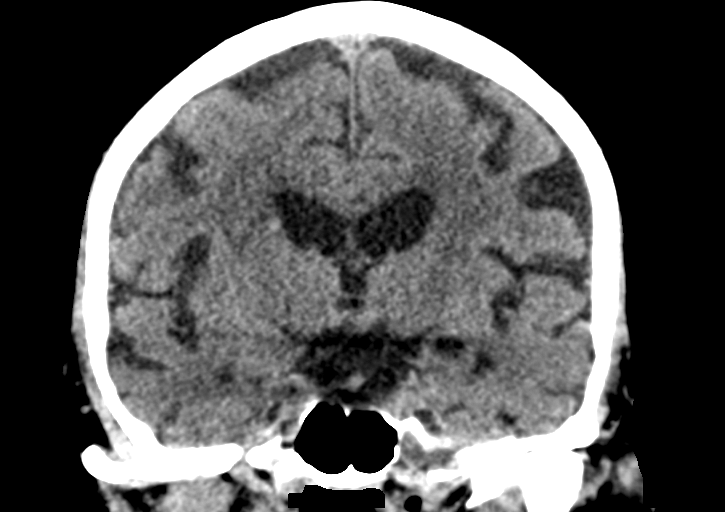

[Series 5: sagittal soft tissue · sagittal · 0.35mm/px · 3 of 49 slices shown]
[im 17/49  brain]
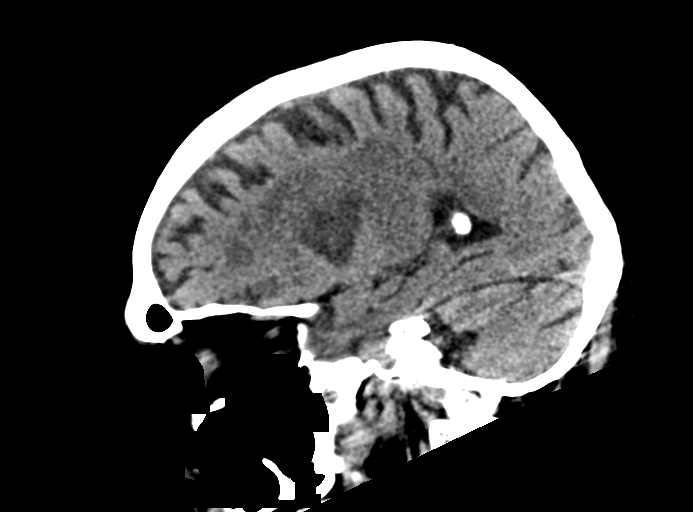
[im 25/49  brain]
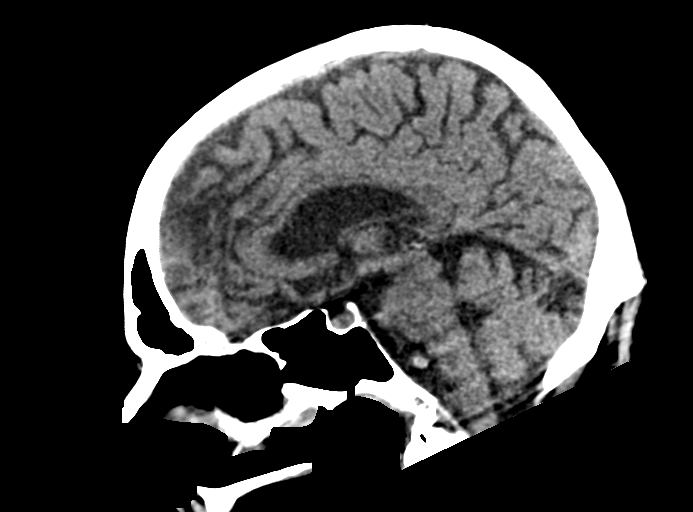
[im 33/49  brain]
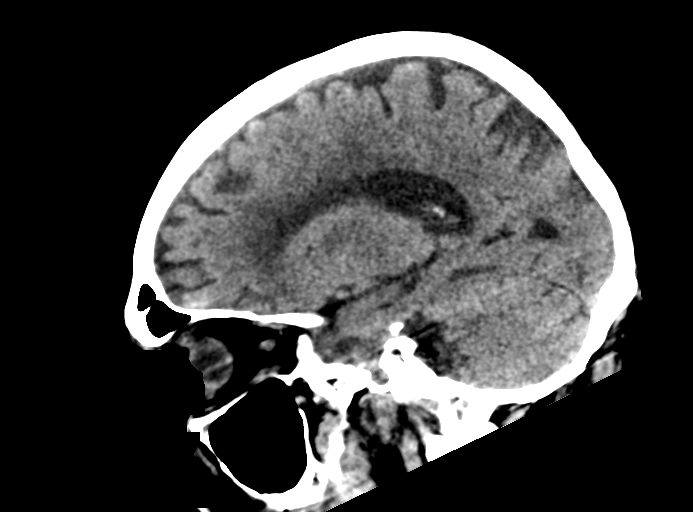

[14 of 47 positions shown; findings below may reference images not displayed]

FINDINGS: Brain: Progressed hypodensity in the right basal ganglia compatible
with cytotoxic edema related to the recent infarct. This appears
stable to the diffusion abnormality on 05/24/2018. No associated
hemorrhage. Minimal associated mass effect on the adjacent right
lateral ventricle.

No new cytotoxic edema identified. Stable gray-white matter
differentiation elsewhere. No intracranial hemorrhage or
ventriculomegaly. 15 millimeter calcified right insula region
meningioma again noted. Much smaller left anterior convexity
meningioma is also stable.

Vascular: Calcified atherosclerosis at the skull base. No suspicious
intracranial vascular hyperdensity.

Skull: No acute osseous abnormality identified.

Sinuses/Orbits: Visualized paranasal sinuses and mastoids are stable
and well pneumatized.

Other: No acute orbit or scalp soft tissue finding.

Partially visible endotracheal tube in the oral cavity.
IMPRESSION: 1. Expected evolution of the recent right basal ganglia infarct with
no associated hemorrhage and minimal mass effect.
2. No new acute intracranial abnormality identified.

## 2020-09-20 IMAGING — CR DG CHEST 1V PORT
1 series · 1 of 1 positions shown · non-contrast
Comparison: [DATE]

CLINICAL DATA: Respiratory failure

EXAM:
PORTABLE CHEST 1 VIEW

[portable]
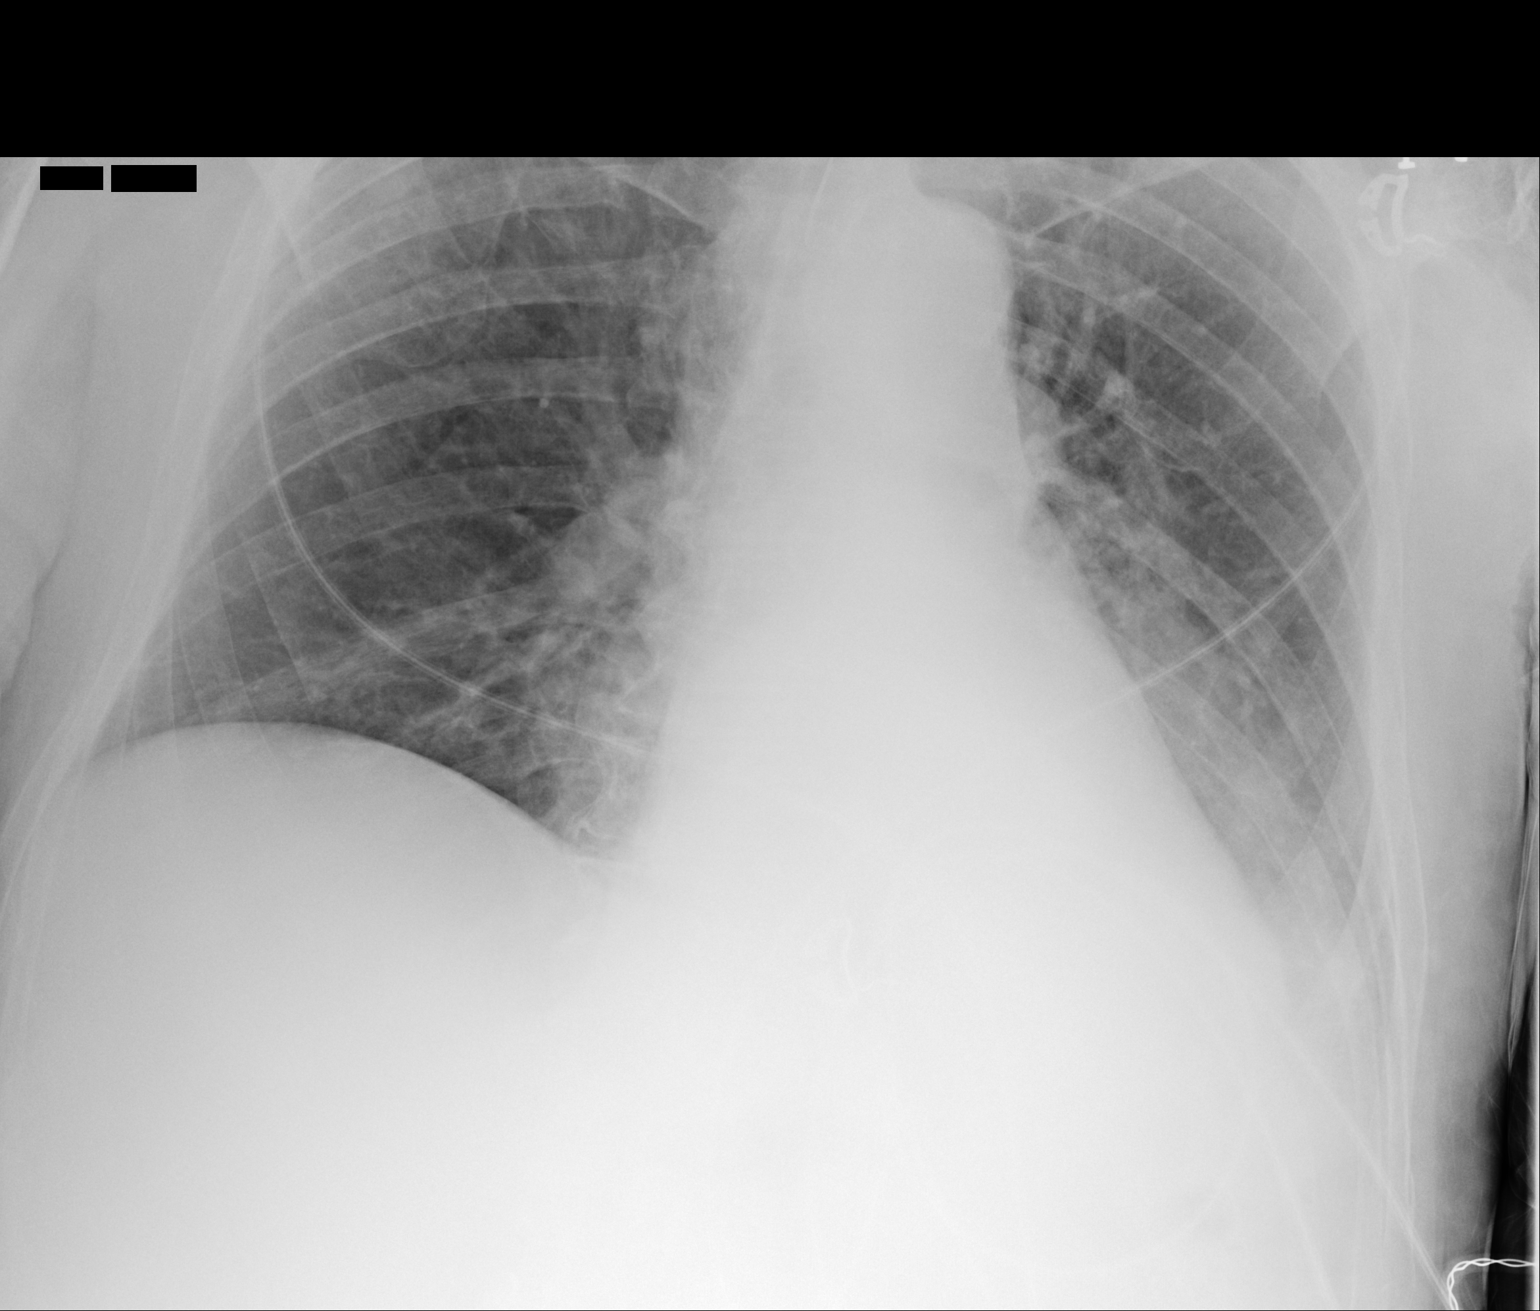

[1 of 1 positions shown; findings below may reference images not displayed]

FINDINGS: Cardiac shadow is stable. Increasing left retrocardiac atelectatic
changes are noted. Right lung remains clear. Endotracheal tube is
noted in satisfactory position. No bony abnormality is seen.
IMPRESSION: Increasing left retrocardiac atelectasis.

## 2020-09-25 IMAGING — CR DG CHEST 1V PORT
1 series · 1 of 1 positions shown · non-contrast
Comparison: Radiograph May 31, 2018.

CLINICAL DATA: Respiratory failure.

EXAM:
PORTABLE CHEST 1 VIEW

[portable]
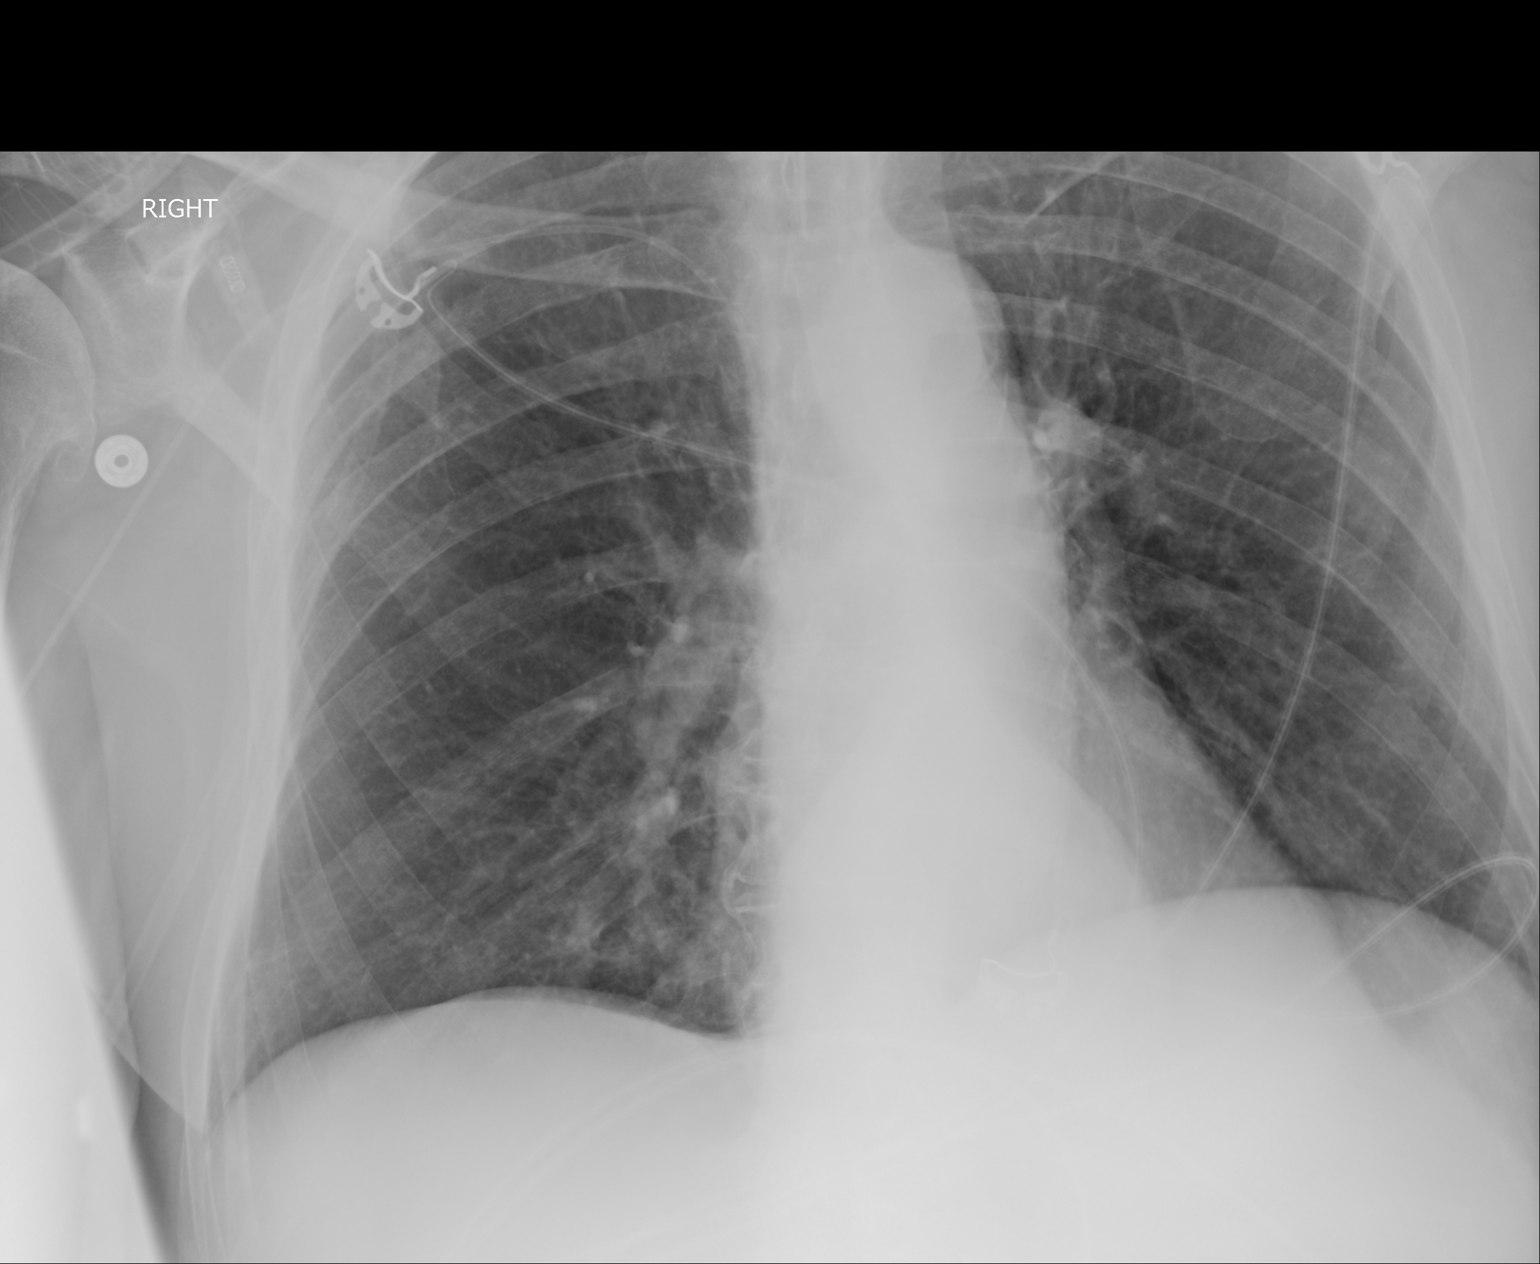

[1 of 1 positions shown; findings below may reference images not displayed]

FINDINGS: The heart size and mediastinal contours are within normal limits.
Both lungs are clear. No pneumothorax or pleural effusion is noted.
Endotracheal tube is unchanged in position. Stable position of
right-sided PICC line with distal tip in expected position of the
SVC. The visualized skeletal structures are unremarkable.
IMPRESSION: Stable support apparatus. No acute cardiopulmonary abnormality seen.
